# Patient Record
Sex: Female | Born: 1965
Health system: Southern US, Community
[De-identification: ages and names within clinical notes are randomized; demographics above are authoritative.]

## PROBLEM LIST (undated history)

## (undated) DIAGNOSIS — M1990 Unspecified osteoarthritis, unspecified site: Secondary | ICD-10-CM

## (undated) DIAGNOSIS — J40 Bronchitis, not specified as acute or chronic: Secondary | ICD-10-CM

## (undated) DIAGNOSIS — E559 Vitamin D deficiency, unspecified: Secondary | ICD-10-CM

## (undated) DIAGNOSIS — E039 Hypothyroidism, unspecified: Secondary | ICD-10-CM

## (undated) DIAGNOSIS — F329 Major depressive disorder, single episode, unspecified: Secondary | ICD-10-CM

## (undated) DIAGNOSIS — F32A Depression, unspecified: Secondary | ICD-10-CM

## (undated) DIAGNOSIS — G4733 Obstructive sleep apnea (adult) (pediatric): Secondary | ICD-10-CM

## (undated) DIAGNOSIS — F419 Anxiety disorder, unspecified: Secondary | ICD-10-CM

## (undated) DIAGNOSIS — M549 Dorsalgia, unspecified: Secondary | ICD-10-CM

## (undated) DIAGNOSIS — E739 Lactose intolerance, unspecified: Secondary | ICD-10-CM

## (undated) DIAGNOSIS — K449 Diaphragmatic hernia without obstruction or gangrene: Secondary | ICD-10-CM

## (undated) DIAGNOSIS — I10 Essential (primary) hypertension: Secondary | ICD-10-CM

## (undated) DIAGNOSIS — R7303 Prediabetes: Secondary | ICD-10-CM

## (undated) DIAGNOSIS — H9319 Tinnitus, unspecified ear: Secondary | ICD-10-CM

## (undated) DIAGNOSIS — M2550 Pain in unspecified joint: Secondary | ICD-10-CM

## (undated) DIAGNOSIS — N289 Disorder of kidney and ureter, unspecified: Secondary | ICD-10-CM

## (undated) DIAGNOSIS — K219 Gastro-esophageal reflux disease without esophagitis: Secondary | ICD-10-CM

## (undated) DIAGNOSIS — R42 Dizziness and giddiness: Secondary | ICD-10-CM

## (undated) DIAGNOSIS — K760 Fatty (change of) liver, not elsewhere classified: Secondary | ICD-10-CM

## (undated) DIAGNOSIS — J309 Allergic rhinitis, unspecified: Secondary | ICD-10-CM

## (undated) DIAGNOSIS — J302 Other seasonal allergic rhinitis: Secondary | ICD-10-CM

## (undated) DIAGNOSIS — K5900 Constipation, unspecified: Secondary | ICD-10-CM

## (undated) DIAGNOSIS — Z8719 Personal history of other diseases of the digestive system: Secondary | ICD-10-CM

## (undated) DIAGNOSIS — D649 Anemia, unspecified: Secondary | ICD-10-CM

## (undated) DIAGNOSIS — Z87442 Personal history of urinary calculi: Secondary | ICD-10-CM

## (undated) DIAGNOSIS — K589 Irritable bowel syndrome without diarrhea: Secondary | ICD-10-CM

## (undated) HISTORY — PX: CARPAL TUNNEL RELEASE: SHX101

## (undated) HISTORY — DX: Vitamin D deficiency, unspecified: E55.9

## (undated) HISTORY — DX: Dorsalgia, unspecified: M54.9

## (undated) HISTORY — DX: Fatty (change of) liver, not elsewhere classified: K76.0

## (undated) HISTORY — DX: Constipation, unspecified: K59.00

## (undated) HISTORY — PX: URETER SURGERY: SHX823

## (undated) HISTORY — PX: KNEE ARTHROSCOPY: SHX127

## (undated) HISTORY — DX: Disorder of kidney and ureter, unspecified: N28.9

## (undated) HISTORY — DX: Diaphragmatic hernia without obstruction or gangrene: K44.9

## (undated) HISTORY — DX: Lactose intolerance, unspecified: E73.9

## (undated) HISTORY — PX: COLONOSCOPY: SHX174

## (undated) HISTORY — DX: Morbid (severe) obesity due to excess calories: E66.01

## (undated) HISTORY — DX: Pain in unspecified joint: M25.50

## (undated) HISTORY — PX: TONSILLECTOMY: SUR1361

## (undated) HISTORY — DX: Unspecified osteoarthritis, unspecified site: M19.90

## (undated) HISTORY — DX: Allergic rhinitis, unspecified: J30.9

## (undated) HISTORY — PX: WISDOM TOOTH EXTRACTION: SHX21

## (undated) HISTORY — DX: Obstructive sleep apnea (adult) (pediatric): G47.33

## (undated) HISTORY — DX: Irritable bowel syndrome, unspecified: K58.9

## (undated) HISTORY — PX: OTHER SURGICAL HISTORY: SHX169

## (undated) HISTORY — DX: Essential (primary) hypertension: I10

---

## 2000-11-15 ENCOUNTER — Emergency Department (HOSPITAL_COMMUNITY): Admission: EM | Admit: 2000-11-15 | Discharge: 2000-11-15 | Payer: Self-pay | Admitting: Emergency Medicine

## 2000-11-15 ENCOUNTER — Encounter: Payer: Self-pay | Admitting: Emergency Medicine

## 2002-02-22 ENCOUNTER — Encounter: Admission: RE | Admit: 2002-02-22 | Discharge: 2002-02-22 | Payer: Self-pay | Admitting: Family Medicine

## 2002-02-22 ENCOUNTER — Encounter: Payer: Self-pay | Admitting: Family Medicine

## 2006-09-12 ENCOUNTER — Other Ambulatory Visit: Admission: RE | Admit: 2006-09-12 | Discharge: 2006-09-12 | Payer: Self-pay | Admitting: Endocrinology

## 2007-09-11 DIAGNOSIS — K449 Diaphragmatic hernia without obstruction or gangrene: Secondary | ICD-10-CM | POA: Insufficient documentation

## 2008-07-10 ENCOUNTER — Encounter: Admission: RE | Admit: 2008-07-10 | Discharge: 2008-07-10 | Payer: Self-pay | Admitting: Family Medicine

## 2010-01-04 ENCOUNTER — Encounter: Admission: RE | Admit: 2010-01-04 | Discharge: 2010-01-04 | Payer: Self-pay | Admitting: Family Medicine

## 2010-11-14 ENCOUNTER — Encounter: Payer: Self-pay | Admitting: Family Medicine

## 2011-02-08 DIAGNOSIS — F339 Major depressive disorder, recurrent, unspecified: Secondary | ICD-10-CM | POA: Insufficient documentation

## 2011-11-01 ENCOUNTER — Other Ambulatory Visit: Payer: Self-pay | Admitting: Orthopedic Surgery

## 2011-11-07 ENCOUNTER — Encounter (HOSPITAL_BASED_OUTPATIENT_CLINIC_OR_DEPARTMENT_OTHER): Payer: Self-pay | Admitting: *Deleted

## 2011-11-07 NOTE — Progress Notes (Signed)
No labs needed

## 2011-11-08 ENCOUNTER — Encounter (HOSPITAL_BASED_OUTPATIENT_CLINIC_OR_DEPARTMENT_OTHER)
Admission: RE | Disposition: A | Discharge status: Home / Self Care | Payer: Self-pay | Source: Ambulatory Visit | Attending: Orthopedic Surgery

## 2011-11-08 ENCOUNTER — Encounter (HOSPITAL_BASED_OUTPATIENT_CLINIC_OR_DEPARTMENT_OTHER): Payer: Self-pay | Admitting: Anesthesiology

## 2011-11-08 ENCOUNTER — Encounter (HOSPITAL_BASED_OUTPATIENT_CLINIC_OR_DEPARTMENT_OTHER): Payer: Self-pay | Admitting: *Deleted

## 2011-11-08 ENCOUNTER — Ambulatory Visit (HOSPITAL_BASED_OUTPATIENT_CLINIC_OR_DEPARTMENT_OTHER): Payer: 59 | Admitting: Anesthesiology

## 2011-11-08 ENCOUNTER — Ambulatory Visit (HOSPITAL_BASED_OUTPATIENT_CLINIC_OR_DEPARTMENT_OTHER)
Admission: RE | Admit: 2011-11-08 | Discharge: 2011-11-08 | Disposition: A | Discharge status: Home / Self Care | Payer: 59 | Source: Ambulatory Visit | Attending: Orthopedic Surgery | Admitting: Orthopedic Surgery

## 2011-11-08 DIAGNOSIS — M19049 Primary osteoarthritis, unspecified hand: Secondary | ICD-10-CM | POA: Insufficient documentation

## 2011-11-08 DIAGNOSIS — K219 Gastro-esophageal reflux disease without esophagitis: Secondary | ICD-10-CM | POA: Insufficient documentation

## 2011-11-08 HISTORY — DX: Hypothyroidism, unspecified: E03.9

## 2011-11-08 HISTORY — DX: Depression, unspecified: F32.A

## 2011-11-08 HISTORY — DX: Other seasonal allergic rhinitis: J30.2

## 2011-11-08 HISTORY — DX: Anxiety disorder, unspecified: F41.9

## 2011-11-08 HISTORY — DX: Gastro-esophageal reflux disease without esophagitis: K21.9

## 2011-11-08 HISTORY — PX: FINGER ARTHRODESIS: SHX5000

## 2011-11-08 HISTORY — DX: Major depressive disorder, single episode, unspecified: F32.9

## 2011-11-08 LAB — POCT HEMOGLOBIN-HEMACUE: Hemoglobin: 13.3 g/dL (ref 12.0–15.0)

## 2011-11-08 SURGERY — FUSION, JOINT, FINGER
Anesthesia: General | Site: Thumb | Laterality: Left | Wound class: Clean

## 2011-11-08 MED ORDER — FENTANYL CITRATE 0.05 MG/ML IJ SOLN: 25.0000 ug | INTRAMUSCULAR | Status: DC | PRN

## 2011-11-08 MED ORDER — CEPHALEXIN 500 MG PO CAPS: 500.0000 mg | ORAL_CAPSULE | Freq: Three times a day (TID) | ORAL | Status: AC

## 2011-11-08 MED ORDER — LIDOCAINE HCL 1 % IJ SOLN
INTRAMUSCULAR | Status: DC | PRN
  Administered 2011-11-08: 2 mL via INTRADERMAL

## 2011-11-08 MED ORDER — MIDAZOLAM HCL 2 MG/2ML IJ SOLN
0.5000 mg | INTRAMUSCULAR | Status: DC | PRN
  Administered 2011-11-08: 2 mg via INTRAVENOUS

## 2011-11-08 MED ORDER — IBUPROFEN 600 MG PO TABS: 600.0000 mg | ORAL_TABLET | Freq: Four times a day (QID) | ORAL | Status: AC | PRN

## 2011-11-08 MED ORDER — ONDANSETRON HCL 4 MG/2ML IJ SOLN
INTRAMUSCULAR | Status: DC | PRN
  Administered 2011-11-08: 4 mg via INTRAVENOUS

## 2011-11-08 MED ORDER — CHLORHEXIDINE GLUCONATE 4 % EX LIQD: 60.0000 mL | Freq: Once | CUTANEOUS | Status: DC

## 2011-11-08 MED ORDER — ROPIVACAINE HCL 5 MG/ML IJ SOLN
INTRAMUSCULAR | Status: DC | PRN
  Administered 2011-11-08: 25 mL via EPIDURAL

## 2011-11-08 MED ORDER — LIDOCAINE HCL (CARDIAC) 20 MG/ML IV SOLN
INTRAVENOUS | Status: DC | PRN
  Administered 2011-11-08: 50 mg via INTRAVENOUS

## 2011-11-08 MED ORDER — LACTATED RINGERS IV SOLN
INTRAVENOUS | Status: DC
  Administered 2011-11-08 (×2): via INTRAVENOUS

## 2011-11-08 MED ORDER — DEXAMETHASONE SODIUM PHOSPHATE 10 MG/ML IJ SOLN
INTRAMUSCULAR | Status: DC | PRN
  Administered 2011-11-08: 10 mg via INTRAVENOUS

## 2011-11-08 MED ORDER — METOCLOPRAMIDE HCL 5 MG/ML IJ SOLN: 10.0000 mg | Freq: Once | INTRAMUSCULAR | Status: DC | PRN

## 2011-11-08 MED ORDER — FENTANYL CITRATE 0.05 MG/ML IJ SOLN
50.0000 ug | INTRAMUSCULAR | Status: DC | PRN
  Administered 2011-11-08: 100 ug via INTRAVENOUS

## 2011-11-08 MED ORDER — CEFAZOLIN SODIUM 1-5 GM-% IV SOLN
1.0000 g | Freq: Once | INTRAVENOUS | Status: AC
  Administered 2011-11-08: 2 g via INTRAVENOUS

## 2011-11-08 MED ORDER — PROPOFOL 10 MG/ML IV EMUL
INTRAVENOUS | Status: DC | PRN
  Administered 2011-11-08: 300 mg via INTRAVENOUS

## 2011-11-08 MED ORDER — OXYCODONE-ACETAMINOPHEN 5-325 MG PO TABS: ORAL_TABLET | ORAL | Status: DC

## 2011-11-08 MED ORDER — MORPHINE SULFATE 2 MG/ML IJ SOLN: 0.0500 mg/kg | INTRAMUSCULAR | Status: DC | PRN

## 2011-11-08 SURGICAL SUPPLY — 85 items
BANDAGE ADHESIVE 1X3 (GAUZE/BANDAGES/DRESSINGS) IMPLANT
BANDAGE CONFORM 3  STR LF (GAUZE/BANDAGES/DRESSINGS) IMPLANT
BANDAGE ELASTIC 3 VELCRO ST LF (GAUZE/BANDAGES/DRESSINGS) ×1 IMPLANT
BANDAGE GAUZE ELAST BULKY 4 IN (GAUZE/BANDAGES/DRESSINGS) ×1 IMPLANT
BIT DRILL 1.1 MINI (BIT) IMPLANT
BLADE MINI RND TIP GREEN BEAV (BLADE) ×1 IMPLANT
BLADE SURG 15 STRL LF DISP TIS (BLADE) ×1 IMPLANT
BLADE SURG 15 STRL SS (BLADE) ×2
BNDG CMPR 9X4 STRL LF SNTH (GAUZE/BANDAGES/DRESSINGS) ×1
BNDG CMPR MD 5X2 ELC HKLP STRL (GAUZE/BANDAGES/DRESSINGS)
BNDG COHESIVE 1X5 TAN STRL LF (GAUZE/BANDAGES/DRESSINGS) IMPLANT
BNDG ELASTIC 2 VLCR STRL LF (GAUZE/BANDAGES/DRESSINGS) IMPLANT
BNDG ESMARK 4X9 LF (GAUZE/BANDAGES/DRESSINGS) ×2 IMPLANT
BRUSH SCRUB EZ PLAIN DRY (MISCELLANEOUS) ×2 IMPLANT
BUR EGG 3PK/BX (BURR) ×1 IMPLANT
BUR EGG/OVAL CARBIDE (BURR) IMPLANT
BUR FAST CUTTING MED (BURR) IMPLANT
CLOTH BEACON ORANGE TIMEOUT ST (SAFETY) ×2 IMPLANT
CORDS BIPOLAR (ELECTRODE) ×2 IMPLANT
COVER MAYO STAND STRL (DRAPES) ×2 IMPLANT
COVER TABLE BACK 60X90 (DRAPES) ×2 IMPLANT
CUFF TOURNIQUET SINGLE 18IN (TOURNIQUET CUFF) IMPLANT
CUFF TOURNIQUET SINGLE 24IN (TOURNIQUET CUFF) ×1 IMPLANT
DECANTER SPIKE VIAL GLASS SM (MISCELLANEOUS) IMPLANT
DRAPE EXTREMITY T 121X128X90 (DRAPE) ×2 IMPLANT
DRAPE OEC MINIVIEW 54X84 (DRAPES) ×2 IMPLANT
DRAPE SURG 17X23 STRL (DRAPES) ×2 IMPLANT
DRILL BIT 1.1 MINI (BIT) ×2
DRSG TEGADERM 4X4.75 (GAUZE/BANDAGES/DRESSINGS) IMPLANT
GAUZE XEROFORM 1X8 LF (GAUZE/BANDAGES/DRESSINGS) IMPLANT
GLOVE BIO SURGEON STRL SZ 6.5 (GLOVE) ×2 IMPLANT
GLOVE BIOGEL M STRL SZ7.5 (GLOVE) ×2 IMPLANT
GLOVE BIOGEL PI IND STRL 7.0 (GLOVE) IMPLANT
GLOVE BIOGEL PI IND STRL 8 (GLOVE) ×1 IMPLANT
GLOVE BIOGEL PI INDICATOR 7.0 (GLOVE) ×1
GLOVE BIOGEL PI INDICATOR 8 (GLOVE)
GLOVE EXAM NITRILE MD LF STRL (GLOVE) ×1 IMPLANT
GLOVE ORTHO TXT STRL SZ7.5 (GLOVE) ×2 IMPLANT
GOWN PREVENTION PLUS XLARGE (GOWN DISPOSABLE) ×3 IMPLANT
GOWN PREVENTION PLUS XXLARGE (GOWN DISPOSABLE) ×2 IMPLANT
GOWN STRL REIN XL XLG (GOWN DISPOSABLE) ×2 IMPLANT
K-WIRE 4.0X.028 (WIRE) IMPLANT
KWIRE 4.0 X .035IN (WIRE) IMPLANT
LOOP VESSEL MAXI BLUE (MISCELLANEOUS) IMPLANT
NDL HYPO 25X1 1.5 SAFETY (NEEDLE) IMPLANT
NEEDLE 27GAX1X1/2 (NEEDLE) IMPLANT
NEEDLE HYPO 25X1 1.5 SAFETY (NEEDLE) IMPLANT
NS IRRIG 1000ML POUR BTL (IV SOLUTION) ×2 IMPLANT
PACK BASIN DAY SURGERY FS (CUSTOM PROCEDURE TRAY) ×2 IMPLANT
PAD CAST 3X4 CTTN HI CHSV (CAST SUPPLIES) IMPLANT
PADDING CAST ABS 4INX4YD NS (CAST SUPPLIES)
PADDING CAST ABS COTTON 4X4 ST (CAST SUPPLIES) ×1 IMPLANT
PADDING CAST COTTON 3X4 STRL (CAST SUPPLIES) ×2
PADDING UNDERCAST 2  STERILE (CAST SUPPLIES) ×2 IMPLANT
PLATE H EXTENDED RIGHT 1.5MM (Plate) ×1 IMPLANT
SCREW CORTEX 1.5X10 (Screw) ×3 IMPLANT
SCREW CORTEX 1.5X14 (Screw) ×3 IMPLANT
SCREW CORTEX 1.5X16 (Screw) ×1 IMPLANT
SCREW CORTEX 1.5X18 (Screw) ×1 IMPLANT
SLEEVE SCD COMPRESS KNEE MED (MISCELLANEOUS) ×1 IMPLANT
SLING ARM FOAM STRAP LRG (SOFTGOODS) ×1 IMPLANT
SPLINT PLASTER EXTRA FAST 3X15 (CAST SUPPLIES) ×10
SPLINT PLASTER GYPS XFAST 3X15 (CAST SUPPLIES) IMPLANT
SPONGE GAUZE 4X4 12PLY (GAUZE/BANDAGES/DRESSINGS) ×2 IMPLANT
STOCKINETTE 4X48 STRL (DRAPES) ×2 IMPLANT
STRIP CLOSURE SKIN 1/2X4 (GAUZE/BANDAGES/DRESSINGS) ×1 IMPLANT
SUT ETHIBOND 3-0 V-5 (SUTURE) ×1 IMPLANT
SUT ETHILON 5 0 P 3 18 (SUTURE)
SUT FIBERWIRE 3-0 18 TAPR NDL (SUTURE)
SUT MERSILENE 4 0 P 3 (SUTURE) IMPLANT
SUT NYLON ETHILON 5-0 P-3 1X18 (SUTURE) IMPLANT
SUT PROLENE 3 0 PS 2 (SUTURE) IMPLANT
SUT PROLENE 4 0 P 3 18 (SUTURE) IMPLANT
SUT STEEL 0 (SUTURE)
SUT STEEL 0 18XMFL TIE 17 (SUTURE) IMPLANT
SUT VIC AB 4-0 P-3 18XBRD (SUTURE) IMPLANT
SUT VIC AB 4-0 P3 18 (SUTURE) ×2
SUTURE FIBERWR 3-0 18 TAPR NDL (SUTURE) IMPLANT
SYR 3ML 23GX1 SAFETY (SYRINGE) IMPLANT
SYR BULB 3OZ (MISCELLANEOUS) ×2 IMPLANT
SYR CONTROL 10ML LL (SYRINGE) ×1 IMPLANT
TOWEL OR 17X24 6PK STRL BLUE (TOWEL DISPOSABLE) ×4 IMPLANT
TRAY DSU PREP LF (CUSTOM PROCEDURE TRAY) ×2 IMPLANT
UNDERPAD 30X30 INCONTINENT (UNDERPADS AND DIAPERS) ×2 IMPLANT
WATER STERILE IRR 1000ML POUR (IV SOLUTION) ×1 IMPLANT

## 2011-11-08 NOTE — Anesthesia Postprocedure Evaluation (Signed)
Anesthesia Post Note  Patient: Melissa [name]  Procedure(s) Performed:  ARTHRODESIS FINGER - left thumb carpal-metacarpal fusion  Anesthesia type: General  Patient location: PACU  Post pain: Pain level controlled  Post assessment: Patient's Cardiovascular Status Stable  Last Vitals:  Filed Vitals:   11/08/11 1255  BP: 135/68  Pulse: 83  Temp: 37.1 C  Resp: 16    Post vital signs: Reviewed and stable  Level of consciousness: alert  Complications: No apparent anesthesia complications

## 2011-11-08 NOTE — Op Note (Signed)
Op note dictated 11/08/11  [id number]

## 2011-11-08 NOTE — Progress Notes (Signed)
Assisted Dr. Frederick with left, ultrasound guided, supraclavicular block. Side rails up, monitors on throughout procedure. See vital signs in flow sheet. Tolerated Procedure well. 

## 2011-11-08 NOTE — Anesthesia Preprocedure Evaluation (Signed)
Anesthesia Evaluation  Patient identified by MRN, date of birth, ID band Patient awake    Reviewed: Allergy & Precautions, H&P , NPO status , Patient's Chart, lab work & pertinent test results, reviewed documented beta blocker date and time   Airway Mallampati: II TM Distance: >3 FB Neck ROM: full    Dental   Pulmonary neg pulmonary ROS,          Cardiovascular neg cardio ROS     Neuro/Psych PSYCHIATRIC DISORDERS Negative Neurological ROS     GI/Hepatic negative GI ROS, Neg liver ROS, GERD-  Medicated and Controlled,  Endo/Other  Morbid obesity  Renal/GU negative Renal ROS  Genitourinary negative   Musculoskeletal   Abdominal   Peds  Hematology negative hematology ROS (+)   Anesthesia Other Findings See surgeon's H&P   Reproductive/Obstetrics negative OB ROS                           Anesthesia Physical Anesthesia Plan  ASA: III  Anesthesia Plan: General   Post-op Pain Management: MAC Combined w/ Regional for Post-op pain   Induction: Intravenous  Airway Management Planned: LMA  Additional Equipment:   Intra-op Plan:   Post-operative Plan: Extubation in OR  Informed Consent: I have reviewed the patients History and Physical, chart, labs and discussed the procedure including the risks, benefits and alternatives for the proposed anesthesia with the patient or authorized representative who has indicated his/her understanding and acceptance.     Plan Discussed with: CRNA and Surgeon  Anesthesia Plan Comments:         Anesthesia Quick Evaluation

## 2011-11-08 NOTE — Brief Op Note (Signed)
11/08/2011  11:39 AM  PATIENT:  Melissa [name]  46 y.o. female  PRE-OPERATIVE DIAGNOSIS:  arthritis left thumb carpometacarpal joint  POST-OPERATIVE DIAGNOSIS:  arthritis left thumb carpometacarpal joint  PROCEDURE:  Procedure(s): ARTHRODESIS OF LEFT THUMB CARPOMETACARPAL JOINT  SURGEON:  Surgeon(s): Wyn Forster., MD  PHYSICIAN ASSISTANT:   ASSISTANTS: Mallory Shirk.A-C   ANESTHESIA:   general  EBL:  Total I/O In: 1000 [I.V.:1000] Out: -   BLOOD ADMINISTERED:none  DRAINS: none   LOCAL MEDICATIONS USED:  Plexus block pre operatively  SPECIMEN:  No Specimen  DISPOSITION OF SPECIMEN:  N/A  COUNTS:  YES  TOURNIQUET:  * Missing tourniquet times found for documented tourniquets in log:  [id number] *  DICTATION: .Other Dictation: Dictation Number [id number]  PLAN OF CARE: Discharge to home after PACU  PATIENT DISPOSITION:  PACU - hemodynamically stable.   Delay start of Pharmacological VTE agent (>24hrs) due to surgical blood loss or risk of bleeding:  NO/NOT APPLICABLE

## 2011-11-08 NOTE — Anesthesia Procedure Notes (Addendum)
Anesthesia Regional Block:  Supraclavicular block  Pre-Anesthetic Checklist: ,, timeout performed, Correct Patient, Correct Site, Correct Laterality, Correct Procedure, Correct Position, site marked, Risks and benefits discussed,  Surgical consent,  Pre-op evaluation,  At surgeon's request and post-op pain management  Laterality: Left  Prep: chloraprep       Needles:   Needle Type: Other   (Arrow Echogenic)   Needle Length: 9cm  Needle Gauge: 21    Additional Needles:  Procedures: ultrasound guided Supraclavicular block Narrative:  Start time: 11/08/2011 9:58 AM End time: 11/08/2011 10:05 AM Injection made incrementally with aspirations every 5 mL.  Performed by: Personally  Anesthesiologist: C Frederick  Additional Notes: Ultrasound guidance used to: id relevant anatomy, confirm needle position, local anesthetic spread, avoidance of vascular puncture. Picture saved. No complications. Block performed personally by Janetta Hora. Gelene Mink, MD    Supraclavicular block Procedure Name: LMA Insertion Performed by: Sharyne Richters Pre-anesthesia Checklist: Patient identified, Timeout performed, Emergency Drugs available, Suction available and Patient being monitored Patient Re-evaluated:Patient Re-evaluated prior to inductionOxygen Delivery Method: Circle System Utilized Preoxygenation: Pre-oxygenation with 100% oxygen Intubation Type: IV induction Ventilation: Mask ventilation without difficulty LMA: LMA with gastric port inserted LMA Size: 4.0 Number of attempts: 1 Placement Confirmation: breath sounds checked- equal and bilateral and positive ETCO2 Tube secured with: Tape Dental Injury: Teeth and Oropharynx as per pre-operative assessment

## 2011-11-08 NOTE — Transfer of Care (Signed)
Immediate Anesthesia Transfer of Care Note  Patient: Melissa [name]  Procedure(s) Performed:  ARTHRODESIS FINGER - left thumb carpal-metacarpal fusion  Patient Location: PACU  Anesthesia Type: General  Level of Consciousness: awake  Airway & Oxygen Therapy: Patient Spontanous Breathing and Patient connected to face mask oxygen  Post-op Assessment: Report given to PACU RN and Post -op Vital signs reviewed and stable  Post vital signs: Reviewed and stable Filed Vitals:   11/08/11 1008  BP:   Pulse: 87  Temp:   Resp: 12    Complications: No apparent anesthesia complications

## 2011-11-08 NOTE — H&P (Signed)
Melissa [name] is an 46 y.o. female.   Chief Complaint: Planing of chronic pain left thumb HPI: The patient is a 46 year old right-hand-dominant female who works hard with her hands every day. She has been a long-time patient of our practice we have seen her in regards to her left thumb CMC arthrosis. She is undergone numerous injections into her [facility] joint her x-rays reveal Eaton stage 3+ CMC arthrosis. She wishes to proceed with surgical intervention for this progressive and chronic predicament.  Past Medical History  Diagnosis Date  . Hypothyroidism   . GERD (gastroesophageal reflux disease)   . Anxiety   . Depression   . Seasonal allergies     Past Surgical History  Procedure Date  . Carpal tunnel release     rt/lt  . Knee arthroscopy     both knees  . Ureter surgery     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-little lt kidney function    History reviewed. No pertinent family history. Social History:  reports that she has never smoked. She does not have any smokeless tobacco history on file. She reports that she does not drink alcohol or use illicit drugs.  Allergies: No Known Allergies  Medications Prior to Admission  Medication Dose Route Frequency Provider Last Rate Last Dose  . chlorhexidine (HIBICLENS) 4 % liquid 4 application  60 mL Topical Once       . lactated ringers infusion   Intravenous Continuous Zenon Mayo, MD       Medications Prior to Admission  Medication Sig Dispense Refill  . acetaminophen (TYLENOL) 325 MG tablet Take 650 mg by mouth every 6 (six) hours as needed.      Marland Kitchen buPROPion (WELLBUTRIN XL) 300 MG 24 hr tablet Take 300 mg by mouth daily.      . calcium-vitamin D (OSCAL WITH D) 500-200 MG-UNIT per tablet Take 1 tablet by mouth daily.      . fish oil-omega-3 fatty acids 1000 MG capsule Take 2 g by mouth daily.      Marland Kitchen FLUoxetine (PROZAC) 40 MG capsule Take 40 mg by mouth daily.      Marland Kitchen levothyroxine (SYNTHROID, LEVOTHROID) 175 MCG tablet  Take 175 mcg by mouth daily.      Marland Kitchen loratadine (CLARITIN) 10 MG tablet Take 10 mg by mouth daily.      Marland Kitchen omeprazole (PRILOSEC) 40 MG capsule Take 40 mg by mouth Nightly.      . pravastatin (PRAVACHOL) 40 MG tablet Take 40 mg by mouth every evening.        No results found for this or any previous visit (from the past 48 hour(s)).  No results found.   Pertinent items are noted in HPI.  Blood pressure 124/71, pulse 75, temperature 98 F (36.7 C), temperature source Oral, resp. rate 20, height 5\' 4"  (1.626 m), weight 104.327 kg (230 lb), SpO2 98.00%.  General appearance: alert Head: Normocephalic, without obvious abnormality Neck: supple, symmetrical, trachea midline Resp: clear to auscultation bilaterally Cardio: regular rate and rhythm, S1, S2 normal, no murmur, click, rub or gallop GI: normal findings: bowel sounds normal Extremities examination of the left thumb reveals pain to palpation in the area of the [facility] joint. She has a positive grind and translation test. Neurovascularly the, and remainder of the hand are intact. X-rays reveal Eaton stage 3+ CMC arthrosis. Pulses: 2+ and symmetric Skin: normal Neurologic: Grossly normal    Assessment/Plan Impression: Chronic and progressive left thumb CMC arthrosis  Plan: Pt to undergo  Left thumb CMC arthrodesis.The procedure, risks and post-op course were discussed with the patient at length and she was in agreement with the plan. DASNOIT,Arminda Foglio J 11/08/2011, 9:26 AM   H&P documentation: 11/08/2011  -History and Physical Reviewed  -Patient has been re-examined  -No change in the plan of care  Wyn Forster, MD

## 2011-11-09 ENCOUNTER — Encounter (HOSPITAL_BASED_OUTPATIENT_CLINIC_OR_DEPARTMENT_OTHER): Payer: Self-pay | Admitting: Orthopedic Surgery

## 2011-11-09 NOTE — Op Note (Signed)
[name], [name]              ACCOUNT NO.:  [id number]  MEDICAL RECORD NO.:  [id number]  LOCATION:                                 FACILITY:  PHYSICIAN:  Katy Fitch. Merle Whitehorn, M.D.      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  OPERATION:  Arthrodesis of left thumb carpometacarpal joint with application of an 8-hole ladder plate with 1.5 mm screw fixation including lag screw fixation across the carpometacarpal joint.  OPERATIVE SURGEON:  Katy Fitch. Osias Resnick, MD  ASSISTANT:  Marveen Reeks. Dasnoit, PA-C.  ANESTHESIA:  General by LMA supplemented by a left plexus block.  SUPERVISING ANESTHESIOLOGIST:  Janetta Hora. 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  She is involved daily in very heavy physical labor.   In July, 2010, she presented for evaluation of left thumb carpometacarpal arthritis.  At that time, we treated her with splinting activity modification, glucosamine and activity suggestions for her work.  She subsequently was treated with a series of steroid injections without longstanding relief.  She requested that some type of surgery be performs so she could continue her gainful employment.  At age 46 in her nondominant hand, I recommended that we proceed with arthrodesis anticipating at a later date.  She could have this taken down for an arthroplasty if she desired.  Preoperatively, films of her scaphoid trapezial trapezoid joint and trapezial trapezoid joint were  carefully inspected and found to be clinically normal.  Preoperatively, she was reminded of potential risks and benefits of surgery.  With all attempts at arthrodesis, there is a possibility that we will not be successful achieving arthrodesis and might require revision surgery at a later date for additional bone graft and/or repeat internal fixation.  Other risks including infection, anesthetic risks, and neurovascular injury were discussed.  Questions were invited and answered in detail.  PROCEDURE:  Melissa [name] was brought to room 2 of the [facility] and placed supine position on the operating room table.  Following an anesthesia consult by Dr. Gelene Mink, general anesthesia by LMA technique was recommended and accepted as well as a left plexus block for perioperative comfort.  The block was placed in the holding area without complication utilizing ultrasound guidance.  After detailed informed consent, she was transferred to room 2 where under Dr. Zada Girt direct supervision, general anesthesia by LMA technique was induced.  Ancef 2 g were administered as an IV prophylactic antibiotic per weight protocol followed by routine Betadine scrub and paint in the left upper extremity.  Following routine surgical time-out, the left upper extremity was exsanguinated with an Esmarch bandage and the arterial tourniquet on the proximal brachium inflated to 220 mmHg.  Procedure commenced with a longitudinal incision paralleling the extensor pollicis brevis.  This was extended to the diaphysis of the  metacarpal and proximally to the scaphoid.  Subcutaneous tissues were carefully divided taking care to identify the radial superficial sensory branches, the extensor pollicis brevis and extensor pollicis longus.  The extensor retinaculum was split between the 2 tendons followed by subperiosteal elevation exposing the proximal metaphysis of the thumb metacarpal and then distal  90% of the trapezium.  The radial artery and its accompanying vessels particularly the vessels of the carpus were identified and carefully protected.  After blunt retractors were placed, we proceeded to perform a synovectomy at the [facility] joint.  There was complete loss of hyaline articular cartilage surfaces of the trapezium and about 85% loss on the base of the thumb metacarpal.  A rongeur was used to remove residual cartilage of the base of metacarpal followed by use of a micro curette and a power bur to create matching cup and cone type surfaces for a thumb arthrodesis.  We ultimately positioned thumb at 40 degrees of palmar abduction and approximately 10 degrees of radial abduction in slight pronation.  With a local bone graft in place, we compressed the joint surfaces and applied an ASIF stainless steel ladder plate with properly measured screws.  The screws in the 2nd most proximal position were placed across the [facility] joint as lag screws.  The remaining screws were placed with standard ASIF technique.  A very satisfactory construct was achieved.  We confirmed that the thumb tip could touch the tip of all fingers and could perform proper key pinch.  Thereafter the periosteum was meticulously repaired with mattress sutures of 3-0 Ethibond followed by careful identification position of the radial artery.  The radial artery was not contacting the plate.  The wound was thoroughly irrigated followed by repair of the skin with subcutaneous suture of 4-0 Vicryl and intradermal 3-0 Prolene.  Melissa [name] was placed in a voluminous gauze hand dressing with Webril and plaster splint supporting the thumb in the fused position.  There were no apparent complications.  For aftercare, she was provided prescriptions for oxycodone 5 mg 1 or 2 tablets p.o. q.4-6 [name]. p.r.n. pain, 30 tablets without refill, also Motrin 600 mg 1 p.o. q.6 [name]. p.r.n. pain and Keflex 5 mg 1 p.o. q.8 [name]. x4 days as  prophylactic antibiotic.     Katy Fitch Zavior Thomason, M.D.     RVS/MEDQ  D:  11/08/2011  T:  11/09/2011  Job:  [id number]  cc:   Melissa Nettle, NP

## 2012-01-16 ENCOUNTER — Encounter (HOSPITAL_BASED_OUTPATIENT_CLINIC_OR_DEPARTMENT_OTHER): Payer: Self-pay

## 2012-10-23 ENCOUNTER — Encounter (HOSPITAL_COMMUNITY): Payer: Self-pay | Admitting: Pharmacy Technician

## 2012-10-25 NOTE — Progress Notes (Signed)
EKG, Clearance and OV note Fannie Knee NP 12/13 on chart

## 2012-10-25 NOTE — Patient Instructions (Addendum)
20 Melissa [name]  10/25/2012   Your procedure is scheduled on: 11/05/12  MONDAY   Report to [facility] at    0515   AM.  Call this number if you have problems the morning of surgery: [phone]       Remember:   Do not eat food  Or drink :After Midnight. Sunday NIGHT   Take these medicines the morning of surgery with A SIP OF WATER: Wellbutrin, Prozac, Levothyroxine, Claritin   .  Contacts, dentures or partial plates can not be worn to surgery  Leave suitcase in the car. After surgery it may be brought to your room.  For patients admitted to the hospital, checkout time is 11:00 AM day of  discharge.             SPECIAL INSTRUCTIONS- SEE [place] PREPARING FOR SURGERY INSTRUCTION SHEET-     DO NOT WEAR JEWELRY, LOTIONS, POWDERS, OR PERFUMES.  WOMEN-- DO NOT SHAVE LEGS OR UNDERARMS FOR 12 HOURS BEFORE SHOWERS. MEN MAY SHAVE FACE.  Patients discharged the day of surgery will not be allowed to drive home. IF going home the day of surgery, you must have a driver and someone to stay with you for the first 24 hours  Name and phone number of your driver:    admission                                                                    Please read over the following fact sheets that you were given: MRSA Information, Incentive Spirometry Sheet, Blood Transfusion Sheet  Information                                                                                   [name]  PST 336  [phone]                 FAILURE TO FOLLOW THESE INSTRUCTIONS MAY RESULT IN  CANCELLATION   OF YOUR SURGERY                                                  Patient Signature _____________________________

## 2012-10-26 ENCOUNTER — Encounter (HOSPITAL_COMMUNITY): Payer: Self-pay

## 2012-10-26 ENCOUNTER — Encounter (HOSPITAL_COMMUNITY)
Admission: RE | Admit: 2012-10-26 | Discharge: 2012-10-26 | Disposition: A | Discharge status: Home / Self Care | Payer: BC Managed Care – PPO | Source: Ambulatory Visit | Attending: Orthopedic Surgery | Admitting: Orthopedic Surgery

## 2012-10-26 HISTORY — DX: Unspecified osteoarthritis, unspecified site: M19.90

## 2012-10-26 LAB — URINALYSIS, ROUTINE W REFLEX MICROSCOPIC
Glucose, UA: NEGATIVE mg/dL
Ketones, ur: NEGATIVE mg/dL
Leukocytes, UA: NEGATIVE
pH: 7.5 (ref 5.0–8.0)

## 2012-10-26 LAB — SURGICAL PCR SCREEN
MRSA, PCR: NEGATIVE
Staphylococcus aureus: POSITIVE — AB

## 2012-10-26 LAB — CBC
HCT: 38.4 % (ref 36.0–46.0)
MCV: 84.2 fL (ref 78.0–100.0)
RDW: 13.6 % (ref 11.5–15.5)
WBC: 5.4 10*3/uL (ref 4.0–10.5)

## 2012-10-26 LAB — APTT: aPTT: 31 seconds (ref 24–37)

## 2012-10-26 LAB — HCG, SERUM, QUALITATIVE: Preg, Serum: NEGATIVE

## 2012-10-26 LAB — BASIC METABOLIC PANEL
CO2: 27 mEq/L (ref 19–32)
Chloride: 101 mEq/L (ref 96–112)
Creatinine, Ser: 0.88 mg/dL (ref 0.50–1.10)

## 2012-10-26 NOTE — H&P (Signed)
TOTAL KNEE ADMISSION H&P  Patient is being admitted for right medial unicompartmental knee arthroplasty.  Subjective:  Chief Complaint:   Right knee OA / pain.  HPI: Melissa [name], 47 y.o. female, has a history of pain and functional disability in the right knee due to arthritis and has failed non-surgical conservative treatments for greater than 12 weeks to includeNSAID's and/or analgesics, corticosteriod injections and activity modification.  Onset of symptoms was gradual, starting >10 years ago with gradually worsening course since that time. The patient noted prior procedures on the knee to include  arthroscopy on the right knee(s).  Patient currently rates pain in the right knee(s) at 10 out of 10 with activity. Patient has worsening of pain with activity and weight bearing, pain that interferes with activities of daily living, pain with passive range of motion, crepitus and joint swelling.  Patient has evidence of periarticular osteophytes and joint space narrowing by imaging studies.  There is no active infection. Risks, benefits and expectations were discussed with the patient. Patient understand the risks, benefits and expectations and wishes to proceed with surgery.   D/C Plans:  Home with HHPT  Post-op Meds:   Rx given for ASA, Robaxin, Celebrex, Iron, Colace and MiraLax  Tranexamic Acid:  To be given  Decadron:   To be given   Past Medical History  Diagnosis Date  . Hypothyroidism   . GERD (gastroesophageal reflux disease)   . Anxiety   . Depression   . Seasonal allergies   . Arthritis     Past Surgical History  Procedure Date  . Carpal tunnel release     rt/lt  . Knee arthroscopy     both knees  . Ureter surgery     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-little lt kidney function  . Finger arthrodesis 11/08/2011    Procedure: ARTHRODESIS FINGER;  Surgeon: Wyn Forster., MD;  Location: [facility];  Service: Orthopedics;  Laterality: Left;  left  thumb carpal-metacarpal fusion  . Tonsillectomy      No Known Allergies  History  Substance Use Topics  . Smoking status: Never Smoker   . Smokeless tobacco: Never Used  . Alcohol Use: No     Comment: not in 4 yr      Review of Systems  Constitutional: Negative.   HENT: Negative.   Eyes: Negative.   Respiratory: Negative.   Cardiovascular: Negative.   Gastrointestinal: Negative.   Genitourinary: Negative.   Musculoskeletal: Positive for joint pain.  Skin: Negative.   Neurological: Negative.   Endo/Heme/Allergies: Negative.   Psychiatric/Behavioral: Negative.     Objective:  Physical Exam  Constitutional: She is oriented to person, place, and time. She appears well-developed and well-nourished.  HENT:  Head: Normocephalic and atraumatic.  Mouth/Throat: Oropharynx is clear and moist.  Eyes: Pupils are equal, round, and reactive to light.  Neck: Neck supple. No JVD present. No tracheal deviation present. No thyromegaly present.  Cardiovascular: Normal rate, regular rhythm, normal heart sounds and intact distal pulses.   Respiratory: Effort normal and breath sounds normal. No respiratory distress. She has no wheezes.  GI: Soft. There is no tenderness. There is no guarding.  Musculoskeletal:       Right knee: She exhibits decreased range of motion, swelling and bony tenderness. She exhibits no ecchymosis, no laceration, no erythema and normal alignment. tenderness found. Medial joint line tenderness noted. No lateral joint line tenderness noted.  Lymphadenopathy:    She has no cervical adenopathy.  Neurological: She  is alert and oriented to person, place, and time.  Skin: Skin is warm and dry.  Psychiatric: She has a normal mood and affect.    Vital signs in last 24 hours: Temp:  [97.8 F (36.6 C)] 97.8 F (36.6 C) (01/03 0933) Pulse Rate:  [73] 73  (01/03 0933) Resp:  [18] 18  (01/03 0933) BP: (140)/(77) 140/77 mmHg (01/03 0933) SpO2:  [100 %] 100 % (01/03  0933) Weight:  [105.235 kg (232 lb)] 105.235 kg (232 lb) (01/03 0933)  Labs:   Estimated Body mass index is 39.48 kg/(m^2) as calculated from the following:   Height as of 11/08/11: 5\' 4" (1.626 m).   Weight as of 11/08/11: 230 lb(104.327 kg).   Imaging Review Plain radiographs demonstrate moderate degenerative joint disease of the right knee, medial compartment. The overall alignment isneutral. The bone quality appears to be good for age and reported activity level.  Assessment/Plan:  End stage arthritis, right knee, medial compartment.  The patient history, physical examination, clinical judgment of the provider and imaging studies are consistent with end stage degenerative joint disease of the right knee(s) and total knee arthroplasty is deemed medically necessary. The treatment options including medical management, injection therapy arthroscopy and arthroplasty were discussed at length. The risks and benefits of total knee arthroplasty were presented and reviewed. The risks due to aseptic loosening, infection, stiffness, patella tracking problems, thromboembolic complications and other imponderables were discussed. The patient acknowledged the explanation, agreed to proceed with the plan and consent was signed. Patient is being admitted for inpatient treatment for surgery, pain control, PT, OT, prophylactic antibiotics, VTE prophylaxis, progressive ambulation and ADL's and discharge planning. The patient is planning to be discharged home with home health services.    Anastasio Auerbach Rex Oesterle   PAC  10/26/2012, 4:36 PM

## 2012-11-05 ENCOUNTER — Encounter (HOSPITAL_COMMUNITY): Payer: Self-pay | Admitting: *Deleted

## 2012-11-05 ENCOUNTER — Encounter (HOSPITAL_COMMUNITY): Payer: Self-pay | Admitting: Registered Nurse

## 2012-11-05 ENCOUNTER — Encounter (HOSPITAL_COMMUNITY)
Admission: RE | Disposition: A | Discharge status: Home Health | Payer: Self-pay | Source: Ambulatory Visit | Attending: Orthopedic Surgery

## 2012-11-05 ENCOUNTER — Inpatient Hospital Stay (HOSPITAL_COMMUNITY): Payer: BC Managed Care – PPO | Admitting: Registered Nurse

## 2012-11-05 ENCOUNTER — Inpatient Hospital Stay (HOSPITAL_COMMUNITY)
Admission: RE | Admit: 2012-11-05 | Discharge: 2012-11-06 | DRG: 209 | Disposition: A | Discharge status: Home Health | Payer: BC Managed Care – PPO | Source: Ambulatory Visit | Attending: Orthopedic Surgery | Admitting: Orthopedic Surgery

## 2012-11-05 DIAGNOSIS — Z01812 Encounter for preprocedural laboratory examination: Secondary | ICD-10-CM

## 2012-11-05 DIAGNOSIS — F411 Generalized anxiety disorder: Secondary | ICD-10-CM | POA: Diagnosis present

## 2012-11-05 DIAGNOSIS — F329 Major depressive disorder, single episode, unspecified: Secondary | ICD-10-CM | POA: Diagnosis present

## 2012-11-05 DIAGNOSIS — D62 Acute posthemorrhagic anemia: Secondary | ICD-10-CM | POA: Diagnosis not present

## 2012-11-05 DIAGNOSIS — Z96651 Presence of right artificial knee joint: Secondary | ICD-10-CM

## 2012-11-05 DIAGNOSIS — F3289 Other specified depressive episodes: Secondary | ICD-10-CM | POA: Diagnosis present

## 2012-11-05 DIAGNOSIS — K219 Gastro-esophageal reflux disease without esophagitis: Secondary | ICD-10-CM | POA: Diagnosis present

## 2012-11-05 DIAGNOSIS — M1710 Unilateral primary osteoarthritis, unspecified knee: Principal | ICD-10-CM | POA: Diagnosis present

## 2012-11-05 DIAGNOSIS — E039 Hypothyroidism, unspecified: Secondary | ICD-10-CM | POA: Diagnosis present

## 2012-11-05 DIAGNOSIS — D500 Iron deficiency anemia secondary to blood loss (chronic): Secondary | ICD-10-CM | POA: Diagnosis not present

## 2012-11-05 DIAGNOSIS — E669 Obesity, unspecified: Secondary | ICD-10-CM | POA: Diagnosis present

## 2012-11-05 HISTORY — PX: PARTIAL KNEE ARTHROPLASTY: SHX2174

## 2012-11-05 LAB — TYPE AND SCREEN: ABO/RH(D): O POS

## 2012-11-05 SURGERY — ARTHROPLASTY, KNEE, UNICOMPARTMENTAL
Anesthesia: Spinal | Site: Knee | Laterality: Right | Wound class: Clean

## 2012-11-05 MED ORDER — ZOLPIDEM TARTRATE 5 MG PO TABS: 5.0000 mg | ORAL_TABLET | Freq: Every evening | ORAL | Status: DC | PRN

## 2012-11-05 MED ORDER — DEXAMETHASONE SODIUM PHOSPHATE 10 MG/ML IJ SOLN: 10.0000 mg | Freq: Once | INTRAMUSCULAR | Status: DC

## 2012-11-05 MED ORDER — LORATADINE 10 MG PO TABS
10.0000 mg | ORAL_TABLET | Freq: Every day | ORAL | Status: DC
  Administered 2012-11-06: 10 mg via ORAL
  Filled 2012-11-05 (×2): qty 1

## 2012-11-05 MED ORDER — PROPOFOL 10 MG/ML IV BOLUS
INTRAVENOUS | Status: DC | PRN
  Administered 2012-11-05: 40 mg via INTRAVENOUS
  Administered 2012-11-05: 30 mg via INTRAVENOUS

## 2012-11-05 MED ORDER — LEVOTHYROXINE SODIUM 175 MCG PO TABS
175.0000 ug | ORAL_TABLET | Freq: Every day | ORAL | Status: DC
  Administered 2012-11-06: 175 ug via ORAL
  Filled 2012-11-05 (×2): qty 1

## 2012-11-05 MED ORDER — FLEET ENEMA 7-19 GM/118ML RE ENEM: 1.0000 | ENEMA | Freq: Once | RECTAL | Status: AC | PRN

## 2012-11-05 MED ORDER — PHENYLEPHRINE HCL 10 MG/ML IJ SOLN
INTRAMUSCULAR | Status: DC | PRN
  Administered 2012-11-05: 80 ug via INTRAVENOUS
  Administered 2012-11-05 (×2): 40 ug via INTRAVENOUS

## 2012-11-05 MED ORDER — BUPIVACAINE HCL (PF) 0.75 % IJ SOLN
INTRAMUSCULAR | Status: DC | PRN
  Administered 2012-11-05: 15 mg via INTRATHECAL

## 2012-11-05 MED ORDER — POTASSIUM CHLORIDE 2 MEQ/ML IV SOLN
INTRAVENOUS | Status: DC
  Administered 2012-11-05 (×2): via INTRAVENOUS
  Filled 2012-11-05 (×5): qty 1000

## 2012-11-05 MED ORDER — DIPHENHYDRAMINE HCL 25 MG PO CAPS: 25.0000 mg | ORAL_CAPSULE | Freq: Four times a day (QID) | ORAL | Status: DC | PRN

## 2012-11-05 MED ORDER — ONDANSETRON HCL 4 MG/2ML IJ SOLN: 4.0000 mg | Freq: Four times a day (QID) | INTRAMUSCULAR | Status: DC | PRN

## 2012-11-05 MED ORDER — HYDROMORPHONE HCL PF 1 MG/ML IJ SOLN
0.5000 mg | INTRAMUSCULAR | Status: DC | PRN
  Administered 2012-11-05: 1 mg via INTRAVENOUS
  Administered 2012-11-05: 0.5 mg via INTRAVENOUS
  Administered 2012-11-05: 1 mg via INTRAVENOUS
  Filled 2012-11-05 (×3): qty 1

## 2012-11-05 MED ORDER — KETOROLAC TROMETHAMINE 30 MG/ML IJ SOLN
INTRAMUSCULAR | Status: DC | PRN
  Administered 2012-11-05: 30 mg via INTRAMUSCULAR

## 2012-11-05 MED ORDER — CEFAZOLIN SODIUM-DEXTROSE 2-3 GM-% IV SOLR
2.0000 g | INTRAVENOUS | Status: AC
  Administered 2012-11-05: 2 g via INTRAVENOUS

## 2012-11-05 MED ORDER — BUPIVACAINE-EPINEPHRINE 0.25% -1:200000 IJ SOLN
INTRAMUSCULAR | Status: DC | PRN
  Administered 2012-11-05: 30 mL

## 2012-11-05 MED ORDER — SIMVASTATIN 20 MG PO TABS
20.0000 mg | ORAL_TABLET | Freq: Every day | ORAL | Status: DC
  Administered 2012-11-05: 20 mg via ORAL
  Filled 2012-11-05 (×2): qty 1

## 2012-11-05 MED ORDER — ONDANSETRON HCL 4 MG PO TABS: 4.0000 mg | ORAL_TABLET | Freq: Four times a day (QID) | ORAL | Status: DC | PRN

## 2012-11-05 MED ORDER — HETASTARCH-ELECTROLYTES 6 % IV SOLN
INTRAVENOUS | Status: DC | PRN
  Administered 2012-11-05: 08:00:00 via INTRAVENOUS

## 2012-11-05 MED ORDER — DEXAMETHASONE SODIUM PHOSPHATE 10 MG/ML IJ SOLN
10.0000 mg | Freq: Once | INTRAMUSCULAR | Status: AC
  Administered 2012-11-05: 10 mg via INTRAVENOUS

## 2012-11-05 MED ORDER — PHENOL 1.4 % MT LIQD: 1.0000 | OROMUCOSAL | Status: DC | PRN

## 2012-11-05 MED ORDER — MIDAZOLAM HCL 5 MG/5ML IJ SOLN
INTRAMUSCULAR | Status: DC | PRN
  Administered 2012-11-05: 2 mg via INTRAVENOUS

## 2012-11-05 MED ORDER — ACETAMINOPHEN 10 MG/ML IV SOLN
INTRAVENOUS | Status: DC | PRN
  Administered 2012-11-05: 1000 mg via INTRAVENOUS

## 2012-11-05 MED ORDER — HYDROCODONE-ACETAMINOPHEN 7.5-325 MG PO TABS
1.0000 | ORAL_TABLET | ORAL | Status: DC
  Administered 2012-11-05 – 2012-11-06 (×6): 2 via ORAL
  Filled 2012-11-05 (×6): qty 2

## 2012-11-05 MED ORDER — PANTOPRAZOLE SODIUM 40 MG PO TBEC
80.0000 mg | DELAYED_RELEASE_TABLET | Freq: Every day | ORAL | Status: DC
  Administered 2012-11-05 – 2012-11-06 (×2): 80 mg via ORAL
  Filled 2012-11-05 (×2): qty 2

## 2012-11-05 MED ORDER — ONDANSETRON HCL 4 MG/2ML IJ SOLN
INTRAMUSCULAR | Status: DC | PRN
  Administered 2012-11-05: 4 mg via INTRAVENOUS

## 2012-11-05 MED ORDER — STERILE WATER FOR IRRIGATION IR SOLN
Status: DC | PRN
  Administered 2012-11-05: 1500 mL

## 2012-11-05 MED ORDER — DOCUSATE SODIUM 100 MG PO CAPS
100.0000 mg | ORAL_CAPSULE | Freq: Two times a day (BID) | ORAL | Status: DC
  Administered 2012-11-05 – 2012-11-06 (×2): 100 mg via ORAL

## 2012-11-05 MED ORDER — LACTATED RINGERS IV SOLN
INTRAVENOUS | Status: DC
  Administered 2012-11-05: 10:00:00 via INTRAVENOUS

## 2012-11-05 MED ORDER — MENTHOL 3 MG MT LOZG: 1.0000 | LOZENGE | OROMUCOSAL | Status: DC | PRN

## 2012-11-05 MED ORDER — CEFAZOLIN SODIUM-DEXTROSE 2-3 GM-% IV SOLR
2.0000 g | Freq: Four times a day (QID) | INTRAVENOUS | Status: AC
  Administered 2012-11-05 (×2): 2 g via INTRAVENOUS
  Filled 2012-11-05 (×2): qty 50

## 2012-11-05 MED ORDER — ALUM & MAG HYDROXIDE-SIMETH 200-200-20 MG/5ML PO SUSP: 30.0000 mL | ORAL | Status: DC | PRN

## 2012-11-05 MED ORDER — LACTATED RINGERS IV SOLN
INTRAVENOUS | Status: DC | PRN
  Administered 2012-11-05 (×2): via INTRAVENOUS

## 2012-11-05 MED ORDER — BUPROPION HCL ER (XL) 300 MG PO TB24
300.0000 mg | ORAL_TABLET | Freq: Every day | ORAL | Status: DC
  Administered 2012-11-06: 300 mg via ORAL
  Filled 2012-11-05 (×2): qty 1

## 2012-11-05 MED ORDER — FLUOXETINE HCL 20 MG PO CAPS
40.0000 mg | ORAL_CAPSULE | Freq: Every day | ORAL | Status: DC
  Administered 2012-11-06: 40 mg via ORAL
  Filled 2012-11-05 (×2): qty 2

## 2012-11-05 MED ORDER — PROPOFOL 10 MG/ML IV EMUL
INTRAVENOUS | Status: DC | PRN
  Administered 2012-11-05 (×2): 100 ug/kg/min via INTRAVENOUS

## 2012-11-05 MED ORDER — METHOCARBAMOL 500 MG PO TABS
500.0000 mg | ORAL_TABLET | Freq: Four times a day (QID) | ORAL | Status: DC | PRN
  Administered 2012-11-05: 500 mg via ORAL
  Filled 2012-11-05: qty 1

## 2012-11-05 MED ORDER — POLYETHYLENE GLYCOL 3350 17 G PO PACK
17.0000 g | PACK | Freq: Two times a day (BID) | ORAL | Status: DC
  Administered 2012-11-06: 17 g via ORAL

## 2012-11-05 MED ORDER — FENTANYL CITRATE 0.05 MG/ML IJ SOLN
INTRAMUSCULAR | Status: DC | PRN
  Administered 2012-11-05: 100 ug via INTRAVENOUS

## 2012-11-05 MED ORDER — TRANEXAMIC ACID 100 MG/ML IV SOLN
15.0000 mg/kg | Freq: Once | INTRAVENOUS | Status: AC
  Administered 2012-11-05: 1578 mg via INTRAVENOUS
  Filled 2012-11-05: qty 15.78

## 2012-11-05 MED ORDER — METHOCARBAMOL 100 MG/ML IJ SOLN
500.0000 mg | Freq: Four times a day (QID) | INTRAVENOUS | Status: DC | PRN
  Filled 2012-11-05: qty 5

## 2012-11-05 MED ORDER — HYDROMORPHONE HCL PF 1 MG/ML IJ SOLN: 0.2500 mg | INTRAMUSCULAR | Status: DC | PRN

## 2012-11-05 MED ORDER — 0.9 % SODIUM CHLORIDE (POUR BTL) OPTIME
TOPICAL | Status: DC | PRN
  Administered 2012-11-05: 1000 mL

## 2012-11-05 MED ORDER — BISACODYL 10 MG RE SUPP: 10.0000 mg | Freq: Every day | RECTAL | Status: DC | PRN

## 2012-11-05 MED ORDER — CELECOXIB 200 MG PO CAPS
200.0000 mg | ORAL_CAPSULE | Freq: Two times a day (BID) | ORAL | Status: DC
  Administered 2012-11-05 – 2012-11-06 (×3): 200 mg via ORAL
  Filled 2012-11-05 (×4): qty 1

## 2012-11-05 MED ORDER — RIVAROXABAN 10 MG PO TABS
10.0000 mg | ORAL_TABLET | ORAL | Status: DC
  Administered 2012-11-06: 10 mg via ORAL
  Filled 2012-11-05 (×2): qty 1

## 2012-11-05 MED ORDER — FERROUS SULFATE 325 (65 FE) MG PO TABS
325.0000 mg | ORAL_TABLET | Freq: Three times a day (TID) | ORAL | Status: DC
  Administered 2012-11-05 – 2012-11-06 (×4): 325 mg via ORAL
  Filled 2012-11-05 (×6): qty 1

## 2012-11-05 SURGICAL SUPPLY — 51 items
ADH SKN CLS APL DERMABOND .7 (GAUZE/BANDAGES/DRESSINGS) ×1
BAG SPEC THK2 15X12 ZIP CLS (MISCELLANEOUS) ×1
BAG ZIPLOCK 12X15 (MISCELLANEOUS) ×2 IMPLANT
BANDAGE ELASTIC 6 VELCRO ST LF (GAUZE/BANDAGES/DRESSINGS) ×2 IMPLANT
BANDAGE ESMARK 6X9 LF (GAUZE/BANDAGES/DRESSINGS) ×1 IMPLANT
BLADE SAW RECIPROCATING 77.5 (BLADE) ×2 IMPLANT
BLADE SAW SGTL 13.0X1.19X90.0M (BLADE) ×2 IMPLANT
BNDG CMPR 9X6 STRL LF SNTH (GAUZE/BANDAGES/DRESSINGS) ×1
BNDG ESMARK 6X9 LF (GAUZE/BANDAGES/DRESSINGS) ×2
BOWL SMART MIX CTS (DISPOSABLE) ×2 IMPLANT
CEMENT HV SMART SET (Cement) ×2 IMPLANT
CLOTH BEACON ORANGE TIMEOUT ST (SAFETY) ×2 IMPLANT
COVER SURGICAL LIGHT HANDLE (MISCELLANEOUS) ×2 IMPLANT
CUFF TOURN SGL QUICK 34 (TOURNIQUET CUFF) ×2
CUFF TRNQT CYL 34X4X40X1 (TOURNIQUET CUFF) ×1 IMPLANT
DERMABOND ADVANCED (GAUZE/BANDAGES/DRESSINGS) ×1
DERMABOND ADVANCED .7 DNX12 (GAUZE/BANDAGES/DRESSINGS) ×1 IMPLANT
DRAPE EXTREMITY T 121X128X90 (DRAPE) ×2 IMPLANT
DRAPE POUCH INSTRU U-SHP 10X18 (DRAPES) ×2 IMPLANT
DRSG AQUACEL AG ADV 3.5X 6 (GAUZE/BANDAGES/DRESSINGS) ×2 IMPLANT
DRSG AQUACEL AG ADV 3.5X10 (GAUZE/BANDAGES/DRESSINGS) ×1 IMPLANT
DRSG TEGADERM 4X4.75 (GAUZE/BANDAGES/DRESSINGS) ×2 IMPLANT
DURAPREP 26ML APPLICATOR (WOUND CARE) ×2 IMPLANT
ELECT REM PT RETURN 9FT ADLT (ELECTROSURGICAL) ×2
ELECTRODE REM PT RTRN 9FT ADLT (ELECTROSURGICAL) ×1 IMPLANT
EVACUATOR 1/8 PVC DRAIN (DRAIN) ×2 IMPLANT
FACESHIELD LNG OPTICON STERILE (SAFETY) ×8 IMPLANT
GAUZE SPONGE 2X2 8PLY STRL LF (GAUZE/BANDAGES/DRESSINGS) ×1 IMPLANT
GLOVE BIOGEL PI IND STRL 7.5 (GLOVE) ×1 IMPLANT
GLOVE BIOGEL PI IND STRL 8 (GLOVE) IMPLANT
GLOVE BIOGEL PI INDICATOR 7.5 (GLOVE) ×1
GLOVE BIOGEL PI INDICATOR 8 (GLOVE)
GLOVE ORTHO TXT STRL SZ7.5 (GLOVE) ×4 IMPLANT
GOWN BRE IMP PREV XXLGXLNG (GOWN DISPOSABLE) ×4 IMPLANT
GOWN STRL NON-REIN LRG LVL3 (GOWN DISPOSABLE) ×2 IMPLANT
KIT BASIN OR (CUSTOM PROCEDURE TRAY) ×2 IMPLANT
LEGGING LITHOTOMY PAIR STRL (DRAPES) ×2 IMPLANT
MANIFOLD NEPTUNE II (INSTRUMENTS) ×2 IMPLANT
NDL SAFETY ECLIPSE 18X1.5 (NEEDLE) ×1 IMPLANT
NEEDLE HYPO 18GX1.5 SHARP (NEEDLE) ×2
PACK TOTAL JOINT (CUSTOM PROCEDURE TRAY) ×2 IMPLANT
POSITIONER SURGICAL ARM (MISCELLANEOUS) ×2 IMPLANT
SPONGE GAUZE 2X2 STER 10/PKG (GAUZE/BANDAGES/DRESSINGS) ×1
SUCTION FRAZIER TIP 10 FR DISP (SUCTIONS) ×2 IMPLANT
SUT MNCRL AB 4-0 PS2 18 (SUTURE) ×2 IMPLANT
SUT VIC AB 1 CT1 36 (SUTURE) ×4 IMPLANT
SUT VIC AB 2-0 CT1 27 (SUTURE) ×4
SUT VIC AB 2-0 CT1 TAPERPNT 27 (SUTURE) ×2 IMPLANT
SYR 50ML LL SCALE MARK (SYRINGE) ×2 IMPLANT
TOWEL OR 17X26 10 PK STRL BLUE (TOWEL DISPOSABLE) ×4 IMPLANT
TRAY FOLEY CATH 14FRSI W/METER (CATHETERS) ×2 IMPLANT

## 2012-11-05 NOTE — Anesthesia Preprocedure Evaluation (Addendum)
Anesthesia Evaluation  Patient identified by MRN, date of birth, ID band Patient awake    Reviewed: Allergy & Precautions, H&P , NPO status , Patient's Chart, lab work & pertinent test results  Airway Mallampati: II TM Distance: >3 FB Neck ROM: full    Dental No notable dental hx. (+) Teeth Intact and Dental Advisory Given   Pulmonary neg pulmonary ROS,  breath sounds clear to auscultation  Pulmonary exam normal       Cardiovascular Exercise Tolerance: Good negative cardio ROS  Rhythm:regular Rate:Normal     Neuro/Psych negative neurological ROS  negative psych ROS   GI/Hepatic negative GI ROS, Neg liver ROS, GERD-  Medicated and Controlled,  Endo/Other  negative endocrine ROSHypothyroidism Morbid obesity  Renal/GU negative Renal ROS  negative genitourinary   Musculoskeletal   Abdominal   Peds  Hematology negative hematology ROS (+)   Anesthesia Other Findings   Reproductive/Obstetrics negative OB ROS                         Anesthesia Physical Anesthesia Plan  ASA: II  Anesthesia Plan: Spinal   Post-op Pain Management:    Induction:   Airway Management Planned:   Additional Equipment:   Intra-op Plan:   Post-operative Plan:   Informed Consent: I have reviewed the patients History and Physical, chart, labs and discussed the procedure including the risks, benefits and alternatives for the proposed anesthesia with the patient or authorized representative who has indicated his/her understanding and acceptance.   Dental Advisory Given  Plan Discussed with: CRNA and Surgeon  Anesthesia Plan Comments:         Anesthesia Quick Evaluation

## 2012-11-05 NOTE — Anesthesia Postprocedure Evaluation (Signed)
  Anesthesia Post-op Note  Patient: Melissa [name]  Procedure(s) Performed: Procedure(s) (LRB): UNICOMPARTMENTAL KNEE (Right)  Patient Location: PACU  Anesthesia Type: Spinal  Level of Consciousness: awake and alert   Airway and Oxygen Therapy: Patient Spontanous Breathing  Post-op Pain: mild  Post-op Assessment: Post-op Vital signs reviewed, Patient's Cardiovascular Status Stable, Respiratory Function Stable, Patent Airway and No signs of Nausea or vomiting  Last Vitals:  Filed Vitals:   11/05/12 1045  BP: 105/55  Pulse: 66  Temp: 36.2 C  Resp: 16    Post-op Vital Signs: stable   Complications: No apparent anesthesia complications

## 2012-11-05 NOTE — Anesthesia Procedure Notes (Addendum)
Spinal  Start time: 11/05/2012 7:35 AM End time: 11/05/2012 7:45 AM Staffing Anesthesiologist: Ronelle Nigh L Performed by: anesthesiologist  Preanesthetic Checklist Completed: patient identified, site marked, surgical consent, pre-op evaluation, IV checked, risks and benefits discussed and monitors and equipment checked Spinal Block Patient position: sitting Prep: Betadine Patient monitoring: continuous pulse ox and blood pressure Approach: midline Location: L2-3 Injection technique: single-shot Needle Needle type: Whitacre  Needle gauge: 25 G Needle length: 15.2 cm Assessment Sensory level: T4 Additional Notes CRNA attempted prior to Melissa [name]. CSF return x 3 , no paresthsia, negative heme. Pt tolerated procedure well. Lot [id number] exp 09/2013

## 2012-11-05 NOTE — Transfer of Care (Signed)
Immediate Anesthesia Transfer of Care Note  Patient: Melissa [name]  Procedure(s) Performed: Procedure(s) (LRB) with comments: UNICOMPARTMENTAL KNEE (Right)  Patient Location: PACU  Anesthesia Type:Spinal  Level of Consciousness: awake, alert , oriented and patient cooperative  Airway & Oxygen Therapy: Patient Spontanous Breathing and Patient connected to face mask oxygen  Post-op Assessment: Report given to PACU RN and Post -op Vital signs reviewed and stable  Post vital signs: stable  Complications: No apparent anesthesia complications  T 6 level spinal

## 2012-11-05 NOTE — Op Note (Signed)
NAME: Melissa [name]    MEDICAL RECORD NO.: [id number]   FACILITY: [facility]   DATE OF BIRTH: 1966-08-13  PHYSICIAN: Madlyn Frankel. Charlann Boxer, M.D.    DATE OF PROCEDURE: 11/05/2012    OPERATIVE REPORT   PREOPERATIVE DIAGNOSIS: Right knee medial compartment osteoarthritis.   POSTOPERATIVE DIAGNOSIS: Right knee medial compartment osteoarthritis.  PROCEDURE: Right partial knee replacement utilizing Biomet Oxford knee  component, size small femur, a right medial size AA tibial tray with a size 4 medial small right insert.   SURGEON: Madlyn Frankel. Charlann Boxer, M.D.   ASSISTANT: Lanney Gins, PAC.  Please note that Mr. Melissa [name] was present for the entirety of the case,  utilized for preoperative positioning, perioperative retractor  management, general facilitation of the case and primary wound closure.   ANESTHESIA: Spinal.   SPECIMENS: None.   COMPLICATIONS: None.  DRAINS: 1 medium HV   TOURNIQUET TIME: 41 minutes at 250 mmHg.   INDICATIONS FOR PROCEDURE: Melissa [name] is 47 yo female patient referred for evaluation of right knee pain.  They presented with primary complaints of pain on the medial side of their knee. Radiographs revealed advanced medial compartment arthritis with specifically an antero-medial wear pattern.  There was bone on bone changes noted with subchondral sclerosis and osteophytes present. The patient has had progressive problems failing to respond to conservative measures of medications, injections and activity modification. Risks of infection, DVT, component failure, need for future revision surgery were all discussed and reviewed.  Consent was obtained for benefit of pain relief.   PROCEDURE IN DETAIL: The patient was brought to the operative theater.  Once adequate anesthesia, preoperative antibiotics, 2gm Ancef administered, the patient was positioned in supine position with a right thigh tourniquet  placed. The right lower extremity was prepped and draped in sterile  fashion with  the leg on the Oxford leg holder.  The leg was allowed to flex to 120 degrees. A time-out  was performed identifying the patient, planned procedure, and extremity.  The leg was exsanguinated, tourniquet elevated to 250 mmHg. A midline  incision was made from the proximal pole of the patella to the tibial tubercle. A  soft tissue plane was created and partial median arthrotomy was then  made to allow for subluxation of the patella. Following initial synovectomy and  debridement, the osteophytes were removed off the medial aspect of the  knee.   Attention was first directed to the tibia. The tibial  extramedullary guide was positioned over the anterior crest of the tibia  and pinned into position, and using a measured resection guide from the  Oxford system, a 4 mm resection was made off the proximal tibia. First  the reciprocating saw along the medial aspect of the tibial spines, then the oscillating saw.    At this point, I sized this cut surface seem to be best fit for a size AA tibial tray.  With the retractors out of the wound and the knee held at 90 degrees the 4 feeler gauge had appropriate tension on the medial ligament.   At this point, the femoral canal was opened with a drill and the  intramedullary rod passed. Then using the guide for a small femoral resection off  the posterior aspect of the femur was positioned over the mid portion of the medial femoral condyle.  The orientation was set using the guide that mates the femoral guide to the intramedullary rod.  The 2 drill holes were made into the distal femur.  The posterior guide was  then impacted into place and the posterior  femoral cut made.  At this point, I milled the distal femur with a size 4 spigot in place. At this point, we did a trial reduction of the small femur, size AA tibial tray and a 4 feeler gauge. At 90 degrees of  flexion and at 20 degrees of flexion the knee had symmetric tension on  the ligaments.   Given  these findings, the trial femoral component was removed. Final preparation of tibia was carried out by pinning it in position. Then  using a reciprocating saw I removed bone for the keel. Further bone was  removed with an osteotome.  Trial reduction was now carried out with the small femur, the AA keeled tibial tray, and a size 4 lollipop insert. The balance of the  ligaments appeared to be symmetric at 20 degrees and 90 degrees. Given  all these findings, the trial components were removed.   Cement was mixed. The final components were opened. The knee was irrigated with  normal saline solution. Then final debridements of the  soft tissue was carried out, I also drilled the sclerotic bone with a drill.  The final components were cemented with a single batch of cement in a  two-stage technique with the tibial component cemented first. The knee  was then brought  to 45 degrees of flexion with a 4 feeler gauge, held with pressure for a minute and half.  After this the femoral component was cemented in place.  The knee was again held at 45 degrees of flexion while the cement fully cured.  Excess cement was removed throughout the knee. Tourniquet was let down  after 41 minutes. After the cement had fully cured and excessive cement  was removed throughout the knee there was no visualized cement present.   The final size 4 right medial insert was chosen and snapped into position. We re-irrigated  the knee. I placed a medium Hemovac drain deep. The extensor mechanism  was then reapproximated using a #1 Vicryl with the knee in flexion. The  remaining wound was closed with 2-0 Vicryl and a running 4-0 Monocryl.  The knee was cleaned, dried, and dressed sterilely using Dermabond and  Aquacel dressing. The drain site was dressed separately. The patient  was brought to the recovery room, Ace wrap in place, tolerating the  procedure well. He will be in the hospital for overnight observation.  We will  initiate physical therapy and progress to ambulate.     Madlyn Frankel Charlann Boxer, M.D.

## 2012-11-05 NOTE — Plan of Care (Signed)
Problem: Consults Goal: Diagnosis- Total Joint Replacement Right uni knee

## 2012-11-05 NOTE — Interval H&P Note (Signed)
History and Physical Interval Note:  11/05/2012 7:14 AM  Melissa [name]  has presented today for surgery, with the diagnosis of RIGHT KNEE MEDIAL COMPARTMENTAL OA  The various methods of treatment have been discussed with the patient and family. After consideration of risks, benefits and other options for treatment, the patient has consented to  Procedure(s) (LRB) with comments: UNICOMPARTMENTAL KNEE (Right) as a surgical intervention .  The patient's history has been reviewed, patient examined, no change in status, stable for surgery.  I have reviewed the patient's chart and labs.  Questions were answered to the patient's satisfaction.     [name]

## 2012-11-05 NOTE — Evaluation (Signed)
Physical Therapy Evaluation Patient Details Name: Melissa [name] MRN: [id number] DOB: Mar 25, 1966 Today's Date: 11/05/2012 Time: 5621-3086 PT Time Calculation (min): 31 min  PT Assessment / Plan / Recommendation Clinical Impression  Pt presents s/p R UKR POD 0 with decreased strength, ROM and mobility.  Tolerated OOB and ambulation in hallway very well with no c/o dizziness/light headedness during session.  Pt will benefit from skilled PT in acute venue to address deficits.  PT recommends HHPT for follow up at D/C to maximize pts independence.     PT Assessment  Patient needs continued PT services    Follow Up Recommendations  Home health PT    Does the patient have the potential to tolerate intense rehabilitation      Barriers to Discharge None      Equipment Recommendations  None recommended by PT    Recommendations for Other Services OT consult   Frequency 7X/week    Precautions / Restrictions Precautions Precautions: Knee Required Braces or Orthoses: Knee Immobilizer - Right Knee Immobilizer - Right: Discontinue once straight leg raise with < 10 degree lag Restrictions Weight Bearing Restrictions: No Other Position/Activity Restrictions: WBAT   Pertinent Vitals/Pain 1-2/10 pain with ambulation, ice packs applied      Mobility  Bed Mobility Bed Mobility: Supine to Sit Supine to Sit: 4: Min assist Details for Bed Mobility Assistance: Assist for RLE out of bed with cues for hand placement and technique to self assist.  Transfers Transfers: Sit to Stand;Stand to Sit Sit to Stand: 4: Min assist;From elevated surface;With upper extremity assist;From bed Stand to Sit: 4: Min assist;With upper extremity assist;With armrests;To chair/3-in-1 Details for Transfer Assistance: Assist to rise and steady with min cues for hand placement/LE management.  Ambulation/Gait Ambulation/Gait Assistance: 4: Min guard;4: Min Environmental consultant (Feet): 65 Feet Assistive  device: Rolling walker Ambulation/Gait Assistance Details: Cues for sequencing/technique with RW and to maintain upright posture throughout.  Gait Pattern: Step-to pattern;Decreased stance time - right;Decreased step length - left Gait velocity: decreased Stairs: No Wheelchair Mobility Wheelchair Mobility: No    Shoulder Instructions     Exercises     PT Diagnosis: Difficulty walking;Generalized weakness;Acute pain  PT Problem List: Decreased strength;Decreased range of motion;Decreased activity tolerance;Decreased balance;Decreased mobility;Decreased knowledge of use of DME;Decreased knowledge of precautions;Pain PT Treatment Interventions: DME instruction;Gait training;Stair training;Functional mobility training;Therapeutic activities;Balance training;Therapeutic exercise;Patient/family education   PT Goals Acute Rehab PT Goals PT Goal Formulation: With patient Time For Goal Achievement: 11/07/12 Potential to Achieve Goals: Good Pt will go Supine/Side to Sit: with supervision PT Goal: Supine/Side to Sit - Progress: Goal set today Pt will go Sit to Supine/Side: with supervision PT Goal: Sit to Supine/Side - Progress: Goal set today Pt will go Sit to Stand: with supervision PT Goal: Sit to Stand - Progress: Goal set today Pt will go Stand to Sit: with supervision PT Goal: Stand to Sit - Progress: Goal set today Pt will Ambulate: 51 - 150 feet;with supervision;with least restrictive assistive device PT Goal: Ambulate - Progress: Goal set today Pt will Go Up / Down Stairs: 3-5 stairs;with supervision;with least restrictive assistive device;with rail(s) PT Goal: Up/Down Stairs - Progress: Goal set today  Visit Information  Last PT Received On: 11/05/12 Assistance Needed: +1    Subjective Data  Subjective: We were just talking about you.  Patient Stated Goal: to get back to normal   Prior Functioning  Home Living Lives With: Family Available Help at Discharge: Available 24  hours/day Type of  Home: House Home Access: Stairs to enter [employer] of Steps: 5 Entrance Stairs-Rails: Right;Left Home Layout: One level Bathroom Shower/Tub: Walk-in shower;Tub only Bathroom Toilet: Standard Bathroom Accessibility: Yes How Accessible: Accessible via walker Home Adaptive Equipment: Crutches;Straight cane;Walker - rolling Prior Function Level of Independence: Independent Able to Take Stairs?: Yes Driving: Yes Vocation: Full time employment Communication Communication: No difficulties    Cognition  Overall Cognitive Status: Appears within functional limits for tasks assessed/performed Arousal/Alertness: Awake/alert Orientation Level: Appears intact for tasks assessed Behavior During Session: [facility] for tasks performed    Extremity/Trunk Assessment Right Lower Extremity Assessment RLE ROM/Strength/Tone: Deficits RLE ROM/Strength/Tone Deficits: ankle motions WFL, pt able to perform SLR without assist, however donned KI during session for safety since POD 0 RLE Sensation: WFL - Light Touch Left Lower Extremity Assessment LLE ROM/Strength/Tone: WFL for tasks assessed LLE Sensation: WFL - Light Touch Trunk Assessment Trunk Assessment: Normal   Balance    End of Session PT - End of Session Equipment Utilized During Treatment: Gait belt;Right knee immobilizer Activity Tolerance: Patient tolerated treatment well Patient left: in chair;with call bell/phone within reach;with family/visitor present Nurse Communication: Mobility status  GP     Vista Deck 11/05/2012, 4:21 PM

## 2012-11-06 ENCOUNTER — Encounter (HOSPITAL_COMMUNITY): Payer: Self-pay | Admitting: Orthopedic Surgery

## 2012-11-06 DIAGNOSIS — D500 Iron deficiency anemia secondary to blood loss (chronic): Secondary | ICD-10-CM | POA: Diagnosis not present

## 2012-11-06 DIAGNOSIS — E669 Obesity, unspecified: Secondary | ICD-10-CM | POA: Diagnosis present

## 2012-11-06 LAB — BASIC METABOLIC PANEL
GFR calc Af Amer: 87 mL/min — ABNORMAL LOW (ref >90)
GFR calc non Af Amer: 75 mL/min — ABNORMAL LOW (ref >90)
Potassium: 3.8 mEq/L (ref 3.5–5.1)
Sodium: 137 mEq/L (ref 135–145)

## 2012-11-06 LAB — CBC
Hemoglobin: 10.6 g/dL — ABNORMAL LOW (ref 12.0–15.0)
MCHC: 31.8 g/dL (ref 30.0–36.0)
Platelets: 309 10*3/uL (ref 150–400)
RDW: 13.6 % (ref 11.5–15.5)

## 2012-11-06 MED ORDER — HYDROCODONE-ACETAMINOPHEN 7.5-325 MG PO TABS: 1.0000 | ORAL_TABLET | ORAL | Status: DC | PRN

## 2012-11-06 MED ORDER — DSS 100 MG PO CAPS: 100.0000 mg | ORAL_CAPSULE | Freq: Two times a day (BID) | ORAL | Status: DC

## 2012-11-06 MED ORDER — DIPHENHYDRAMINE HCL 25 MG PO CAPS: 25.0000 mg | ORAL_CAPSULE | Freq: Four times a day (QID) | ORAL | Status: DC | PRN

## 2012-11-06 MED ORDER — FERROUS SULFATE 325 (65 FE) MG PO TABS: 325.0000 mg | ORAL_TABLET | Freq: Three times a day (TID) | ORAL | Status: DC

## 2012-11-06 MED ORDER — METHOCARBAMOL 500 MG PO TABS: 500.0000 mg | ORAL_TABLET | Freq: Four times a day (QID) | ORAL | Status: DC | PRN

## 2012-11-06 MED ORDER — POLYETHYLENE GLYCOL 3350 17 G PO PACK: 17.0000 g | PACK | Freq: Two times a day (BID) | ORAL | Status: DC

## 2012-11-06 MED ORDER — ASPIRIN EC 325 MG PO TBEC: 325.0000 mg | DELAYED_RELEASE_TABLET | Freq: Two times a day (BID) | ORAL | Status: DC

## 2012-11-06 NOTE — Discharge Summary (Signed)
Physician Discharge Summary  Patient ID: Melissa [name] MRN: [id number] DOB/AGE: [birth year] 49 y.o.  Admit date: 11/05/2012 Discharge date:  11/06/2012  Procedures:  Procedure(s) (LRB): UNICOMPARTMENTAL KNEE (Right)  Attending Physician:  Dr. Durene Romans   Admission Diagnoses:   Right knee OA / pain  Discharge Diagnoses:  Principal Problem:  *S/P right UKR Active Problems:  Expected blood loss anemia  Obese Hypothyroidism   GERD (gastroesophageal reflux disease)   Anxiety   Depression   Seasonal allergies   Arthritis  HPI: Micheal Likens, 47 y.o. female, has a history of pain and functional disability in the right knee due to arthritis and has failed non-surgical conservative treatments for greater than 12 weeks to includeNSAID's and/or analgesics, corticosteriod injections and activity modification. Onset of symptoms was gradual, starting >10 years ago with gradually worsening course since that time. The patient noted prior procedures on the knee to include arthroscopy on the right knee(s). Patient currently rates pain in the right knee(s) at 10 out of 10 with activity. Patient has worsening of pain with activity and weight bearing, pain that interferes with activities of daily living, pain with passive range of motion, crepitus and joint swelling. Patient has evidence of periarticular osteophytes and joint space narrowing by imaging studies. There is no active infection. Risks, benefits and expectations were discussed with the patient. Patient understand the risks, benefits and expectations and wishes to proceed with surgery.  PCP: Elizabeth Palau, FNP   Discharged Condition: good  Hospital Course:  Patient underwent the above stated procedure on 11/05/2012. Patient tolerated the procedure well and brought to the recovery room in good condition and subsequently to the floor.  POD #1 BP: 124/86 ; Pulse: 75 ; Temp: 97.9 F (36.6 C) ; Resp: 17  Pt's foley was removed, as  well as the hemovac drain removed. IV was changed to a saline lock. Patient reports pain as mild, pain well controlled. No events throughout the night. Ready to be discharged home. Neurovascular intact, dorsiflexion/plantar flexion intact, incision: dressing C/D/I, no cellulitis present and compartment soft.   LABS  Basename  11/06/12 0420   HGB  10.6  HCT  33.3    Discharge Exam: General appearance: alert, cooperative and no distress Extremities: Homans sign is negative, no sign of DVT, no edema, redness or tenderness in the calves or thighs and no ulcers, gangrene or trophic changes  Disposition:  Home or Self Care with follow up in 2 weeks   Follow-up Information    Follow up with [name], MD. In 2 weeks.   Contact information:   [address] [place] [address] [phone]          Discharge Orders    Future Orders Please Complete By Expires   Diet - low sodium heart healthy      Call MD / Call 911      Comments:   If you experience chest pain or shortness of breath, CALL 911 and be transported to the hospital emergency room.  If you develope a fever above 101 F, pus (white drainage) or increased drainage or redness at the wound, or calf pain, call your surgeon's office.   Discharge instructions      Comments:   Maintain surgical dressing for 10-14 days, then replace with gauze and tape. Keep the area dry and clean until follow up. Follow up in 2 weeks at [facility]. Call with any questions or concerns.   Constipation Prevention  Comments:   Drink plenty of fluids.  Prune juice may be helpful.  You may use a stool softener, such as Colace (over the counter) 100 mg twice a day.  Use MiraLax (over the counter) for constipation as needed.   Increase activity slowly as tolerated      Weight bearing as tolerated      TED hose      Comments:   Use stockings (TED hose) for 2 weeks on both leg(s).  You may remove them at night for  sleeping.   Change dressing      Comments:   Maintain surgical dressing for 10-14 days, then change the dressing daily with sterile 4 x 4 inch gauze dressing and tape. Keep the area dry and clean.      Current Discharge Medication List    START taking these medications   Details  aspirin EC 325 MG tablet Take 1 tablet (325 mg total) by mouth 2 (two) times daily. X 4 weeks Qty: 60 tablet, Refills: 0    diphenhydrAMINE (BENADRYL) 25 mg capsule Take 1 capsule (25 mg total) by mouth every 6 (six) hours as needed for itching, allergies or sleep. Qty: 30 capsule    docusate sodium 100 MG CAPS Take 100 mg by mouth 2 (two) times daily. Qty: 10 capsule    ferrous sulfate 325 (65 FE) MG tablet Take 1 tablet (325 mg total) by mouth 3 (three) times daily after meals.    HYDROcodone-acetaminophen (NORCO) 7.5-325 MG per tablet Take 1-2 tablets by mouth every 4 (four) hours as needed for pain. Qty: 120 tablet, Refills: 0    methocarbamol (ROBAXIN) 500 MG tablet Take 1 tablet (500 mg total) by mouth every 6 (six) hours as needed (muscle spasms).    polyethylene glycol (MIRALAX / GLYCOLAX) packet Take 17 g by mouth 2 (two) times daily. Qty: 14 each      CONTINUE these medications which have NOT CHANGED   Details  buPROPion (WELLBUTRIN XL) 300 MG 24 hr tablet Take 300 mg by mouth daily before breakfast.     calcium-vitamin D (OSCAL WITH D) 500-200 MG-UNIT per tablet Take 1 tablet by mouth daily.    fish oil-omega-3 fatty acids 1000 MG capsule Take 1 g by mouth daily.     Flaxseed, Linseed, (FLAX SEED OIL PO) Take 1 tablet by mouth daily.    FLUoxetine (PROZAC) 40 MG capsule Take 40 mg by mouth daily before breakfast.     levothyroxine (SYNTHROID, LEVOTHROID) 175 MCG tablet Take 175 mcg by mouth daily before breakfast.     loratadine (CLARITIN) 10 MG tablet Take 10 mg by mouth daily.    omeprazole (PRILOSEC) 40 MG capsule Take 40 mg by mouth every evening.     pravastatin (PRAVACHOL) 40  MG tablet Take 40 mg by mouth every evening.    cholecalciferol (VITAMIN D) 1000 UNITS tablet Take 2,000 Units by mouth daily.       STOP taking these medications     acetaminophen (TYLENOL) 325 MG tablet Comments:  Reason for Stopping:       meloxicam (MOBIC) 15 MG tablet Comments:  Reason for Stopping:           Signed: Anastasio Auerbach. Junko Ohagan   PAC  11/06/2012, 7:58 AM

## 2012-11-06 NOTE — Progress Notes (Signed)
Physical Therapy Treatment Patient Details Name: Melissa [name] MRN: [id number] DOB: 1966-05-31 Today's Date: 11/06/2012 Time: 4098-1191 PT Time Calculation (min): 25 min  PT Assessment / Plan / Recommendation Comments on Treatment Session  Pt progressing well with mobility and did well with stair training/exercises.  Ready for D/C.     Follow Up Recommendations  Home health PT     Does the patient have the potential to tolerate intense rehabilitation     Barriers to Discharge        Equipment Recommendations  None recommended by PT    Recommendations for Other Services    Frequency 7X/week   Plan Discharge plan remains appropriate    Precautions / Restrictions Precautions Precautions: Knee Required Braces or Orthoses: Knee Immobilizer - Right Knee Immobilizer - Right: Discontinue once straight leg raise with < 10 degree lag (D/C'd KI due to pt can do SLR) Restrictions Weight Bearing Restrictions: No Other Position/Activity Restrictions: WBAT   Pertinent Vitals/Pain 5/10 pain, ice packs applied    Mobility  Bed Mobility Bed Mobility: Supine to Sit Supine to Sit: 5: Supervision Details for Bed Mobility Assistance: Supervision for safety with min cues for technique.  Transfers Transfers: Sit to Stand;Stand to Sit Sit to Stand: From elevated surface;5: Supervision;From bed Stand to Sit: 5: Supervision;With armrests;To chair/3-in-1 Details for Transfer Assistance: Supervision for safety with min cues for hand placement.  Ambulation/Gait Ambulation/Gait Assistance: 5: Supervision Ambulation Distance (Feet): 300 Feet Assistive device: Rolling walker Ambulation/Gait Assistance Details: Min cues for upright posture and maintaining position inside of RW.  Gait Pattern: Step-to pattern;Decreased stance time - right;Decreased step length - left Gait velocity: decreased Stairs: Yes Stairs Assistance: 4: Min guard Stair Management Technique: One rail Right;Step to  pattern;Forwards;With crutches (single crutch) Number of Stairs: 4  Wheelchair Mobility Wheelchair Mobility: No    Exercises Total Joint Exercises Ankle Circles/Pumps: AROM;Both;20 reps Quad Sets: Strengthening;Right;10 reps Heel Slides: AAROM;Right;10 reps Hip ABduction/ADduction: Strengthening;Right;10 reps Straight Leg Raises: Strengthening;Right;10 reps   PT Diagnosis:    PT Problem List:   PT Treatment Interventions:     PT Goals Acute Rehab PT Goals PT Goal Formulation: With patient Time For Goal Achievement: 11/07/12 Potential to Achieve Goals: Good Pt will go Supine/Side to Sit: with supervision PT Goal: Supine/Side to Sit - Progress: Met Pt will go Sit to Stand: with supervision PT Goal: Sit to Stand - Progress: Met Pt will go Stand to Sit: with supervision PT Goal: Stand to Sit - Progress: Met Pt will Ambulate: 51 - 150 feet;with supervision;with least restrictive assistive device PT Goal: Ambulate - Progress: Met Pt will Go Up / Down Stairs: 3-5 stairs;with supervision;with least restrictive assistive device;with rail(s) PT Goal: Up/Down Stairs - Progress: Partly met  Visit Information  Last PT Received On: 11/06/12 Assistance Needed: +1    Subjective Data  Subjective: I'm not doing as well as I was yesterday.  Patient Stated Goal: to get back to normal   Cognition  Overall Cognitive Status: Appears within functional limits for tasks assessed/performed Arousal/Alertness: Awake/alert Orientation Level: Appears intact for tasks assessed Behavior During Session: [facility] for tasks performed    Balance     End of Session PT - End of Session Activity Tolerance: Patient tolerated treatment well Patient left: in chair;with call bell/phone within reach;with family/visitor present Nurse Communication: Mobility status   GP     Vista Deck 11/06/2012, 9:07 AM

## 2012-11-06 NOTE — Evaluation (Signed)
Occupational Therapy Evaluation Patient Details Name: Melissa [name] MRN: [id number] DOB: [birth year] Today's Date: 11/06/2012 Time: 2130-8657 OT Time Calculation (min): 19 min  OT Assessment / Plan / Recommendation Clinical Impression  This 47 year old female was admitted for R uniknee.  All education was completed.  Pt does not need any further OT nor DME.      OT Assessment  Patient does not need any further OT services    Follow Up Recommendations  No OT follow up    Barriers to Discharge      Equipment Recommendations  None recommended by OT    Recommendations for Other Services    Frequency       Precautions / Restrictions Precautions Precautions: Knee Required Braces or Orthoses: Knee Immobilizer - Right Knee Immobilizer - Right: Discontinue once straight leg raise with < 10 degree lag (met) Restrictions Weight Bearing Restrictions: No Other Position/Activity Restrictions: WBAT   Pertinent Vitals/Pain RLE sore--repositioned and applied ice    ADL  Transfers/Ambulation Related to ADLs: ambulated to bathroom at supervision level.  Pt was able to sit on and get up from 16" surface with walker for support (to simulate standard commode).  Pt verbalizes shower sequence:  did not feel she needed to practice.  ADL Comments: Pt completed adl in bathroom at supervision/set up level.  She did not don socks/shoes but donned rest of clothes.  Mother will help as needed.    OT Diagnosis:    OT Problem List:   OT Treatment Interventions:     OT Goals    Visit Information  Last OT Received On: 11/06/12 Assistance Needed: +1    Subjective Data  Subjective: I have a low commode--it has a wall and shower next to it Patient Stated Goal: home with mother's help   Prior Functioning     Home Living Lives With: Family Available Help at Discharge: Available 24 hours/day Bathroom Shower/Tub: Walk-in shower;Tub only Bathroom Toilet: Standard Prior Function Level of  Independence: Independent Able to Take Stairs?: Yes Driving: Yes Vocation: Full time employment Communication Communication: No difficulties         Vision/Perception     Cognition  Overall Cognitive Status: Appears within functional limits for tasks assessed/performed Arousal/Alertness: Awake/alert Orientation Level: Appears intact for tasks assessed Behavior During Session: [facility] for tasks performed    Extremity/Trunk Assessment Right Upper Extremity Assessment RUE ROM/Strength/Tone: [facility] for tasks assessed Left Upper Extremity Assessment LUE ROM/Strength/Tone: WFL for tasks assessed     Mobility Bed Mobility Bed Mobility: Supine to Sit Supine to Sit: 5: Supervision Details for Bed Mobility Assistance: Supervision for safety with min cues for technique.  Transfers Sit to Stand: From elevated surface;5: Supervision;From bed Stand to Sit: 5: Supervision;With upper extremity assist;To chair/3-in-1;To toilet Details for Transfer Assistance: Supervision for safety with min cues for hand placement.      Shoulder Instructions     Exercise    Balance     End of Session OT - End of Session Activity Tolerance: Patient tolerated treatment well Patient left: in bed;with call bell/phone within reach;with family/visitor present  GO     Tashima Scarpulla 11/06/2012, 10:15 AM Marica Otter, OTR/L [phone] 11/06/2012

## 2012-11-06 NOTE — Progress Notes (Signed)
   Subjective: 1 Day Post-Op Procedure(s) (LRB): UNICOMPARTMENTAL KNEE (Right)   Patient reports pain as mild, pain well controlled. No events throughout the night. Ready to be discharged home.  Objective:   VITALS:   Filed Vitals:   11/06/12  BP: 124/86  Pulse: 75  Temp: 97.9 F (36.6 C)   Resp: 17    Neurovascular intact Dorsiflexion/Plantar flexion intact Incision: dressing C/D/I No cellulitis present Compartment soft  LABS  Basename 11/06/12 0420  HGB 10.6*  HCT 33.3*  WBC 12.7*  PLT 309     Basename 11/06/12 0420  NA 137  K 3.8  BUN 11  CREATININE 0.89  GLUCOSE 133*     Assessment/Plan: 1 Day Post-Op Procedure(s) (LRB): UNICOMPARTMENTAL KNEE (Right) HV drain d/c'ed Foley cath d/c'ed Advance diet Up with therapy D/C IV fluids Discharge home with home health Follow up in 2 weeks at [facility]. Follow up with OLIN,Salvatore Shear D in 2 weeks.  Contact information:  [facility] [address], [address] [place] [address] [phone]     Expected ABLA  Treated with iron and will observe  Obese (BMI 30-39.9) Estimated Body mass index is 39.82 kg/(m^2) as calculated from the following:   Height as of this encounter: 5\' 4" (1.626 m).   Weight as of this encounter: 232 lb(105.235 kg). Patient also counseled that weight may inhibit the healing process Patient counseled that losing weight will help with future health issues      Anastasio Auerbach. Raidyn Breiner   PAC  11/06/2012, 7:51 AM

## 2012-11-06 NOTE — Care Management Note (Signed)
    Page 1 of 1   11/06/2012     3:45:56 PM   CARE MANAGEMENT NOTE 11/06/2012  Patient:  Melissa [name], Melissa [name]   Account Number:  [id number]  Date Initiated:  11/06/2012  Documentation initiated by:  Colleen Can  Subjective/Objective Assessment:   dx partial knee replacemnt-right     Action/Plan:   CM spoke with patient. Plans are for patient to return to her home in [facility] where mother will be caregiver. She already has RW and crutches. Plans to use [facility] for [facility] services   Anticipated DC Date:  11/06/2012   Anticipated DC Plan:  HOME W HOME HEALTH SERVICES  In-house referral  NA      DC Planning Services  CM consult      [facility] Choice  HOME HEALTH   Choice offered to / List presented to:  C-1 Patient        HH arranged  HH-2 PT      [facility] agency  [facility]   Status of service:  Completed, signed off Medicare Important Message given?  NO (If response is "NO", the following Medicare IM given date fields will be blank) Date Medicare IM given:   Date Additional Medicare IM given:    Discharge Disposition:  HOME W HOME HEALTH SERVICES  Per UR Regulation:  Reviewed for med. necessity/level of care/duration of stay  If discussed at Long Length of Stay Meetings, dates discussed:    Comments:  11/06/2012 Colleen Can BSN RN CCM [phone] [facility] Care will start services tomorrow 11/07/2012.

## 2014-08-05 DIAGNOSIS — G4733 Obstructive sleep apnea (adult) (pediatric): Secondary | ICD-10-CM | POA: Insufficient documentation

## 2015-04-11 ENCOUNTER — Encounter (HOSPITAL_COMMUNITY): Payer: Self-pay | Admitting: Nurse Practitioner

## 2015-04-11 ENCOUNTER — Emergency Department (HOSPITAL_COMMUNITY)
Admission: EM | Admit: 2015-04-11 | Discharge: 2015-04-11 | Disposition: A | Discharge status: Home / Self Care | Payer: BLUE CROSS/BLUE SHIELD | Attending: Emergency Medicine | Admitting: Emergency Medicine

## 2015-04-11 DIAGNOSIS — E039 Hypothyroidism, unspecified: Secondary | ICD-10-CM | POA: Diagnosis not present

## 2015-04-11 DIAGNOSIS — M1990 Unspecified osteoarthritis, unspecified site: Secondary | ICD-10-CM | POA: Diagnosis not present

## 2015-04-11 DIAGNOSIS — Z79899 Other long term (current) drug therapy: Secondary | ICD-10-CM | POA: Insufficient documentation

## 2015-04-11 DIAGNOSIS — R197 Diarrhea, unspecified: Secondary | ICD-10-CM

## 2015-04-11 DIAGNOSIS — R109 Unspecified abdominal pain: Secondary | ICD-10-CM

## 2015-04-11 DIAGNOSIS — F419 Anxiety disorder, unspecified: Secondary | ICD-10-CM | POA: Insufficient documentation

## 2015-04-11 DIAGNOSIS — Z7982 Long term (current) use of aspirin: Secondary | ICD-10-CM | POA: Diagnosis not present

## 2015-04-11 DIAGNOSIS — F329 Major depressive disorder, single episode, unspecified: Secondary | ICD-10-CM | POA: Insufficient documentation

## 2015-04-11 DIAGNOSIS — K219 Gastro-esophageal reflux disease without esophagitis: Secondary | ICD-10-CM | POA: Diagnosis not present

## 2015-04-11 LAB — COMPREHENSIVE METABOLIC PANEL
ALT: 17 U/L (ref 14–54)
AST: 19 U/L (ref 15–41)
Albumin: 3.6 g/dL (ref 3.5–5.0)
Alkaline Phosphatase: 81 U/L (ref 38–126)
Anion gap: 8 (ref 5–15)
BILIRUBIN TOTAL: 0.2 mg/dL — AB (ref 0.3–1.2)
BUN: 10 mg/dL (ref 6–20)
CHLORIDE: 107 mmol/L (ref 101–111)
CO2: 24 mmol/L (ref 22–32)
CREATININE: 0.82 mg/dL (ref 0.44–1.00)
Calcium: 9.2 mg/dL (ref 8.9–10.3)
GLUCOSE: 115 mg/dL — AB (ref 65–99)
Potassium: 4 mmol/L (ref 3.5–5.1)
Sodium: 139 mmol/L (ref 135–145)
Total Protein: 6.6 g/dL (ref 6.5–8.1)

## 2015-04-11 LAB — CBC WITH DIFFERENTIAL/PLATELET
BASOS PCT: 1 % (ref 0–1)
Basophils Absolute: 0.1 10*3/uL (ref 0.0–0.1)
EOS ABS: 0.5 10*3/uL (ref 0.0–0.7)
Eosinophils Relative: 5 % (ref 0–5)
HCT: 38.1 % (ref 36.0–46.0)
Hemoglobin: 12.3 g/dL (ref 12.0–15.0)
Lymphocytes Relative: 32 % (ref 12–46)
Lymphs Abs: 2.8 10*3/uL (ref 0.7–4.0)
MCH: 28 pg (ref 26.0–34.0)
MCHC: 32.3 g/dL (ref 30.0–36.0)
MCV: 86.8 fL (ref 78.0–100.0)
MONO ABS: 0.7 10*3/uL (ref 0.1–1.0)
MONOS PCT: 8 % (ref 3–12)
NEUTROS ABS: 4.7 10*3/uL (ref 1.7–7.7)
Neutrophils Relative %: 55 % (ref 43–77)
Platelets: 371 10*3/uL (ref 150–400)
RBC: 4.39 MIL/uL (ref 3.87–5.11)
RDW: 14.6 % (ref 11.5–15.5)
WBC: 8.7 10*3/uL (ref 4.0–10.5)

## 2015-04-11 LAB — LIPASE, BLOOD: Lipase: 16 U/L — ABNORMAL LOW (ref 22–51)

## 2015-04-11 LAB — URINALYSIS, ROUTINE W REFLEX MICROSCOPIC
Bilirubin Urine: NEGATIVE
GLUCOSE, UA: NEGATIVE mg/dL
HGB URINE DIPSTICK: NEGATIVE
Ketones, ur: NEGATIVE mg/dL
LEUKOCYTES UA: NEGATIVE
Nitrite: NEGATIVE
Protein, ur: NEGATIVE mg/dL
SPECIFIC GRAVITY, URINE: 1.023 (ref 1.005–1.030)
Urobilinogen, UA: 0.2 mg/dL (ref 0.0–1.0)
pH: 6 (ref 5.0–8.0)

## 2015-04-11 MED ORDER — ACETAMINOPHEN 500 MG PO TABS
1000.0000 mg | ORAL_TABLET | Freq: Once | ORAL | Status: AC
  Administered 2015-04-11: 1000 mg via ORAL
  Filled 2015-04-11: qty 2

## 2015-04-11 NOTE — ED Notes (Signed)
Pt c/o R sided lower back and flank pain onset yesterday then into RLQ today. Denies n/v. Reports loose stool x 3 days.

## 2015-04-11 NOTE — Discharge Instructions (Signed)
Abdominal Pain, Women °Abdominal (stomach, pelvic, or belly) pain can be caused by many things. It is important to tell your doctor: °· The location of the pain. °· Does it come and go or is it present all the time? °· Are there things that start the pain (eating certain foods, exercise)? °· Are there other symptoms associated with the pain (fever, nausea, vomiting, diarrhea)? °All of this is helpful to know when trying to find the cause of the pain. °CAUSES  °· Stomach: virus or bacteria infection, or ulcer. °· Intestine: appendicitis (inflamed appendix), regional ileitis (Crohn's disease), ulcerative colitis (inflamed colon), irritable bowel syndrome, diverticulitis (inflamed diverticulum of the colon), or cancer of the stomach or intestine. °· Gallbladder disease or stones in the gallbladder. °· Kidney disease, kidney stones, or infection. °· Pancreas infection or cancer. °· Fibromyalgia (pain disorder). °· Diseases of the female organs: °¨ Uterus: fibroid (non-cancerous) tumors or infection. °¨ Fallopian tubes: infection or tubal pregnancy. °¨ Ovary: cysts or tumors. °¨ Pelvic adhesions (scar tissue). °¨ Endometriosis (uterus lining tissue growing in the pelvis and on the pelvic organs). °¨ Pelvic congestion syndrome (female organs filling up with blood just before the menstrual period). °¨ Pain with the menstrual period. °¨ Pain with ovulation (producing an egg). °¨ Pain with an IUD (intrauterine device, birth control) in the uterus. °¨ Cancer of the female organs. °· Functional pain (pain not caused by a disease, may improve without treatment). °· Psychological pain. °· Depression. °DIAGNOSIS  °Your doctor will decide the seriousness of your pain by doing an examination. °· Blood tests. °· X-rays. °· Ultrasound. °· CT scan (computed tomography, special type of X-ray). °· MRI (magnetic resonance imaging). °· Cultures, for infection. °· Barium enema (dye inserted in the large intestine, to better view it with  X-rays). °· Colonoscopy (looking in intestine with a lighted tube). °· Laparoscopy (minor surgery, looking in abdomen with a lighted tube). °· Major abdominal exploratory surgery (looking in abdomen with a large incision). °TREATMENT  °The treatment will depend on the cause of the pain.  °· Many cases can be observed and treated at home. °· Over-the-counter medicines recommended by your caregiver. °· Prescription medicine. °· Antibiotics, for infection. °· Birth control pills, for painful periods or for ovulation pain. °· Hormone treatment, for endometriosis. °· Nerve blocking injections. °· Physical therapy. °· Antidepressants. °· Counseling with a psychologist or psychiatrist. °· Minor or major surgery. °HOME CARE INSTRUCTIONS  °· Do not take laxatives, unless directed by your caregiver. °· Take over-the-counter pain medicine only if ordered by your caregiver. Do not take aspirin because it can cause an upset stomach or bleeding. °· Try a clear liquid diet (broth or water) as ordered by your caregiver. Slowly move to a bland diet, as tolerated, if the pain is related to the stomach or intestine. °· Have a thermometer and take your temperature several times a day, and record it. °· Bed rest and sleep, if it helps the pain. °· Avoid sexual intercourse, if it causes pain. °· Avoid stressful situations. °· Keep your follow-up appointments and tests, as your caregiver orders. °· If the pain does not go away with medicine or surgery, you may try: °¨ Acupuncture. °¨ Relaxation exercises (yoga, meditation). °¨ Group therapy. °¨ Counseling. °SEEK MEDICAL CARE IF:  °· You notice certain foods cause stomach pain. °· Your home care treatment is not helping your pain. °· You need stronger pain medicine. °· You want your IUD removed. °· You feel faint or   lightheaded. °· You develop nausea and vomiting. °· You develop a rash. °· You are having side effects or an allergy to your medicine. °SEEK IMMEDIATE MEDICAL CARE IF:  °· Your  pain does not go away or gets worse. °· You have a fever. °· Your pain is felt only in portions of the abdomen. The right side could possibly be appendicitis. The left lower portion of the abdomen could be colitis or diverticulitis. °· You are passing blood in your stools (bright red or black tarry stools, with or without vomiting). °· You have blood in your urine. °· You develop chills, with or without a fever. °· You pass out. °MAKE SURE YOU:  °· Understand these instructions. °· Will watch your condition. °· Will get help right away if you are not doing well or get worse. °Document Released: 08/07/2007 Document Revised: 02/24/2014 Document Reviewed: 08/27/2009 °ExitCare® Patient Information ©2015 [employer]. This information is not intended to replace advice given to you by your health care provider. Make sure you discuss any questions you have with your health care provider. ° °

## 2015-04-11 NOTE — ED Provider Notes (Signed)
CSN: [id number]     Arrival date & time 04/11/15  1757 History   First MD Initiated Contact with Patient 04/11/15 1811     Chief Complaint  Patient presents with  . Abdominal Pain     (Consider location/radiation/quality/duration/timing/severity/associated sxs/prior Treatment) Patient is a 49 y.o. female presenting with abdominal pain. The history is provided by the patient.  Abdominal Pain Pain location:  R flank Pain quality: aching   Pain radiates to:  Does not radiate Pain severity:  No pain Onset quality:  Gradual Duration:  1 day Timing:  Constant Progression:  Worsening Chronicity:  New Context: not alcohol use and not sick contacts   Relieved by:  Nothing Worsened by:  Nothing tried Ineffective treatments:  None tried Associated symptoms: diarrhea (1 week)   Associated symptoms: no fever, no hematuria, no nausea and no vomiting   Risk factors: not pregnant     Past Medical History  Diagnosis Date  . Hypothyroidism   . GERD (gastroesophageal reflux disease)   . Anxiety   . Depression   . Seasonal allergies   . Arthritis    Past Surgical History  Procedure Laterality Date  . Carpal tunnel release      rt/lt  . Knee arthroscopy      both knees  . 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 Finger arthrodesis  11/08/2011    Procedure: ARTHRODESIS FINGER;  Surgeon: Wyn Forster., MD;  Location: [facility];  Service: Orthopedics;  Laterality: Left;  left thumb carpal-metacarpal fusion  . Tonsillectomy    . Partial knee arthroplasty  11/05/2012    Procedure: UNICOMPARTMENTAL KNEE;  Surgeon: [name], MD;  Location: [facility];  Service: Orthopedics;  Laterality: Right;   History reviewed. No pertinent family history. History  Substance Use Topics  . Smoking status: Never Smoker   . Smokeless tobacco: Never Used  . Alcohol Use: No     Comment: not in 4 yr   OB History    No data available      Review of Systems  Constitutional: Negative for fever.  Gastrointestinal: Positive for abdominal pain and diarrhea (1 week). Negative for nausea and vomiting.  Genitourinary: Negative for hematuria.  All other systems reviewed and are negative.     Allergies  Bee venom  Home Medications   Prior to Admission medications   Medication Sig Start Date End Date Taking? Authorizing Provider  acetaminophen (TYLENOL) 500 MG tablet Take 500-1,000 mg by mouth at bedtime. Dose varies based on pain level   Yes Historical Provider, MD  aspirin EC 81 MG tablet Take 81 mg by mouth daily.   Yes Historical Provider, MD  buPROPion (WELLBUTRIN XL) 150 MG 24 hr tablet Take 300 mg by mouth daily.  04/01/15  Yes Historical Provider, MD  calcium-vitamin D (OSCAL WITH D) 500-200 MG-UNIT per tablet Take 2 tablets by mouth daily.    Yes Historical Provider, MD  Cholecalciferol (VITAMIN D) 2000 UNITS tablet Take 2,000 Units by mouth daily.   Yes Historical Provider, MD  diphenhydrAMINE (BENADRYL) 25 mg capsule Take 1 capsule (25 mg total) by mouth every 6 (six) hours as needed for itching, allergies or sleep. Patient taking differently: Take 50 mg by mouth at bedtime.  11/06/12  Yes Lanney Gins, PA-C  EPINEPHrine (EPIPEN 2-PAK) 0.3 mg/0.3 mL IJ SOAJ injection Inject 0.3 mg into the muscle once as needed (allergic reaction).  04/23/13  Yes  Historical Provider, MD  FLUoxetine (PROZAC) 20 MG capsule Take 20 mg by mouth daily. 03/13/15  Yes Historical Provider, MD  levothyroxine (SYNTHROID, LEVOTHROID) 150 MCG tablet Take 150 mcg by mouth daily before breakfast.  03/13/15  Yes Historical Provider, MD  loratadine (CLARITIN) 10 MG tablet Take 10 mg by mouth daily.   Yes Historical Provider, MD  meloxicam (MOBIC) 15 MG tablet Take 15 mg by mouth daily. 04/10/15  Yes Historical Provider, MD  norethindrone (MICRONOR,CAMILA,ERRIN) 0.35 MG tablet Take 1 tablet by mouth at bedtime.  03/29/15  Yes Historical Provider, MD   omeprazole (PRILOSEC) 40 MG capsule Take 40 mg by mouth at bedtime.    Yes Historical Provider, MD  pravastatin (PRAVACHOL) 40 MG tablet Take 40 mg by mouth at bedtime.    Yes Historical Provider, MD  triamcinolone cream (KENALOG) 0.1 % Apply 1 application topically 2 (two) times daily as needed (rash).  04/23/12  Yes Historical Provider, MD  aspirin EC 325 MG tablet Take 1 tablet (325 mg total) by mouth 2 (two) times daily. X 4 weeks Patient not taking: Reported on 04/11/2015 11/06/12   Lanney Gins, PA-C  docusate sodium 100 MG CAPS Take 100 mg by mouth 2 (two) times daily. Patient not taking: Reported on 04/11/2015 11/06/12   Lanney Gins, PA-C  ferrous sulfate 325 (65 FE) MG tablet Take 1 tablet (325 mg total) by mouth 3 (three) times daily after meals. Patient not taking: Reported on 04/11/2015 11/06/12   Lanney Gins, PA-C  HYDROcodone-acetaminophen (NORCO) 7.5-325 MG per tablet Take 1-2 tablets by mouth every 4 (four) hours as needed for pain. Patient not taking: Reported on 04/11/2015 11/06/12   Lanney Gins, PA-C  methocarbamol (ROBAXIN) 500 MG tablet Take 1 tablet (500 mg total) by mouth every 6 (six) hours as needed (muscle spasms). Patient not taking: Reported on 04/11/2015 11/06/12   Lanney Gins, PA-C  polyethylene glycol (MIRALAX / Ethelene Hal) packet Take 17 g by mouth 2 (two) times daily. Patient not taking: Reported on 04/11/2015 11/06/12   Lanney Gins, PA-C   BP 123/69 mmHg  Pulse 72  Temp(Src) 98 F (36.7 C) (Oral)  Resp 16  SpO2 100% Physical Exam  Constitutional: She is oriented to person, place, and time. She appears well-developed and well-nourished. No distress.  HENT:  Head: Normocephalic.  Eyes: Conjunctivae are normal.  Neck: Neck supple. No tracheal deviation present.  Cardiovascular: Normal rate, regular rhythm and normal heart sounds.   Pulmonary/Chest: Effort normal and breath sounds normal. No respiratory distress.  Abdominal: Soft. Normal appearance. She  exhibits no distension. There is no tenderness. There is CVA tenderness (right). There is no rigidity, no guarding and negative Murphy's sign.  Neurological: She is alert and oriented to person, place, and time.  Skin: Skin is warm and dry.  Psychiatric: She has a normal mood and affect.    ED Course  Procedures (including critical care time) Labs Review Labs Reviewed  COMPREHENSIVE METABOLIC PANEL - Abnormal; Notable for the following:    Glucose, Bld 115 (*)    Total Bilirubin 0.2 (*)    All other components within normal limits  LIPASE, BLOOD - Abnormal; Notable for the following:    Lipase 16 (*)    All other components within normal limits  CBC WITH DIFFERENTIAL/PLATELET  URINALYSIS, ROUTINE W REFLEX MICROSCOPIC (NOT AT [facility])    Imaging Review No results found.   EKG Interpretation None      MDM   Final diagnoses:  Diarrhea  Right-sided abdominal pain of unknown cause    49 year old female presents with right flank pain after a weeklong bout of diarrhea. Screening lab work appears normal, her pain is well-controlled with Tylenol. She is afebrile, has no urinary complaints currently, has no hydro on bedside ultrasound, her aorta is normal dimensions on bedside ultrasound as well. She has no hematuria and her symptoms are very atypical for kidney stone given the preceding diarrheal illness. At this time I feel that no further workup emergently is warranted, the patient was provided return precautions and plan to follow up with her primary care physician this week for recheck of her abdomen.    Lyndal Pulley, MD 04/12/15 8119  Pricilla Loveless, MD 04/13/15 0010

## 2015-09-10 DIAGNOSIS — N1330 Unspecified hydronephrosis: Secondary | ICD-10-CM | POA: Insufficient documentation

## 2015-09-10 DIAGNOSIS — N261 Atrophy of kidney (terminal): Secondary | ICD-10-CM | POA: Insufficient documentation

## 2016-12-13 DIAGNOSIS — E039 Hypothyroidism, unspecified: Secondary | ICD-10-CM | POA: Insufficient documentation

## 2016-12-13 DIAGNOSIS — Z91030 Bee allergy status: Secondary | ICD-10-CM | POA: Insufficient documentation

## 2018-02-05 DIAGNOSIS — M1712 Unilateral primary osteoarthritis, left knee: Secondary | ICD-10-CM | POA: Insufficient documentation

## 2018-02-23 DIAGNOSIS — M1711 Unilateral primary osteoarthritis, right knee: Secondary | ICD-10-CM | POA: Insufficient documentation

## 2018-04-03 DIAGNOSIS — G8929 Other chronic pain: Secondary | ICD-10-CM | POA: Insufficient documentation

## 2018-04-03 DIAGNOSIS — M4310 Spondylolisthesis, site unspecified: Secondary | ICD-10-CM | POA: Insufficient documentation

## 2018-07-21 DIAGNOSIS — M542 Cervicalgia: Secondary | ICD-10-CM | POA: Insufficient documentation

## 2018-08-09 DIAGNOSIS — M5030 Other cervical disc degeneration, unspecified cervical region: Secondary | ICD-10-CM | POA: Insufficient documentation

## 2018-09-26 ENCOUNTER — Encounter: Payer: Self-pay | Admitting: Neurology

## 2018-09-26 ENCOUNTER — Ambulatory Visit: Payer: BLUE CROSS/BLUE SHIELD | Admitting: Neurology

## 2018-09-26 VITALS — BP 125/74 | HR 82 | Ht 63.75 in | Wt 259.0 lb

## 2018-09-26 DIAGNOSIS — R200 Anesthesia of skin: Secondary | ICD-10-CM | POA: Diagnosis not present

## 2018-09-26 NOTE — Progress Notes (Signed)
[facility]    Provider:  Dr Lucia Gaskins Referring Provider: Venita Lick MD Primary Care Physician:  Elizabeth Palau, FNP  CC:  Arms, hands and face go numb  HPI:  Melissa [name] is a 52 y.o. female here as requested by Dr. Shon Baton for neck pain. PMHx hypothyroid, IBS, one kidney. She is here alone. She saw Dr. Shon Baton due to lower back injury. She has numbness/tingly for about a year, getting worse recently. No inciting event. Onset was gradual she would notice it in her hands. She noticed it in her hands but she has a lot of hand issues and plates in her hands and CTS. She was very concerned when she started feeling it in her face bilaterally. It went away for a while but now coming back especially in the left arm/hand. Mri cervical spine did not show any etiology. No symptoms in the lower legs associated although she does have low back pain and sensory changes from that but not associated. Symptoms symmetric. She feels better after PT for weakness and she is taking water exercise classes. She has arthritis in thumbs and difficulty gripping but worsening in the setting of her numbness and tingling. Driving makes it worse.   Reviewed notes, labs and imaging from outside physicians, which showed:  Reviewed notes from [facility]. Patient c/o radicular back pain. Neg babinski and hoffman, neg romberg, symmetric DTRs, MRi showed siignificant nerve compression, selective nerve root block was performed.  Personally reviewed xrays of thoracic spine and c-spine, showed degenerative changes but no fractures pr structural abnormalities.    Review of Systems: Patient complains of symptoms per HPI as well as the following symptoms:numbness,soring,depression. Pertinent negatives and positives per HPI. All others negative.   Social History   Socioeconomic History  . Marital status: Single    Spouse name: Not on file  . Number of children: Not on file  . Years of education: Not  on file  . Highest education level: Not on file  Occupational History  . Not on file  Social Needs  . Financial resource strain: Not on file  . Food insecurity:    Worry: Not on file    Inability: Not on file  . Transportation needs:    Medical: Not on file    Non-medical: Not on file  Tobacco Use  . Smoking status: Never Smoker  . Smokeless tobacco: Never Used  Substance and Sexual Activity  . Alcohol use: No    Comment: not in 4 yr  . Drug use: Never  . Sexual activity: Not on file  Lifestyle  . Physical activity:    Days per week: Not on file    Minutes per session: Not on file  . Stress: Not on file  Relationships  . Social connections:    Talks on phone: Not on file    Gets together: Not on file    Attends religious service: Not on file    Active member of club or organization: Not on file    Attends meetings of clubs or organizations: Not on file    Relationship status: Not on file  . Intimate partner violence:    Fear of current or ex partner: Not on file    Emotionally abused: Not on file    Physically abused: Not on file    Forced sexual activity: Not on file  Other Topics Concern  . Not on file  Social History Narrative   Lives home alone, (has dogs and cats).  Disabled, due to back.  Education 2 college degree's.  Coffee/ sodas 2-3 daily    Family History  Problem Relation Age of Onset  . Arthritis Mother        spianl stenosis  . Atrial fibrillation Mother   . Transient ischemic attack Mother   . Diabetes Father   . Heart attack Father        3 stents  . Atrial fibrillation Sister     Past Medical History:  Diagnosis Date  . Anxiety   . Arthritis   . Depression   . GERD (gastroesophageal reflux disease)   . Hypothyroidism   . IBS (irritable bowel syndrome)   . Seasonal allergies     Past Surgical History:  Procedure Laterality Date  . CARPAL TUNNEL RELEASE     rt/lt  . FINGER ARTHRODESIS  11/08/2011   Procedure: ARTHRODESIS FINGER;   Surgeon: Wyn Forsterobert V Sypher Jr., MD;  Location: [facility];  Service: Orthopedics;  Laterality: Left;  left thumb carpal-metacarpal fusion  . KNEE ARTHROSCOPY     both knees  . PARTIAL KNEE ARTHROPLASTY  11/05/2012   Procedure: UNICOMPARTMENTAL KNEE;  Surgeon: Shelda PalMatthew D Olin, MD;  Location: [facility];  Service: Orthopedics;  Laterality: Right;  . TONSILLECTOMY    . 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 acetaminophen (TYLENOL) 500 MG tablet Take 500-1,000 mg by mouth at bedtime. Dose varies based on pain level    . aspirin EC 81 MG tablet Take 81 mg by mouth daily.    Marland Kitchen. buPROPion (WELLBUTRIN XL) 150 MG 24 hr tablet Take 300 mg by mouth daily.   0  . calcium-vitamin D (OSCAL WITH D) 500-200 MG-UNIT per tablet Take 2 tablets by mouth daily.     . cetirizine (ZYRTEC) 10 MG tablet Take 10 mg by mouth daily.    . Cholecalciferol (VITAMIN D) 2000 UNITS tablet Take 2,000 Units by mouth daily.    . diphenhydrAMINE (BENADRYL) 25 mg capsule Take 1 capsule (25 mg total) by mouth every 6 (six) hours as needed for itching, allergies or sleep. (Patient taking differently: Take 50 mg by mouth at bedtime. ) 30 capsule   . EPINEPHrine (EPIPEN 2-PAK) 0.3 mg/0.3 mL IJ SOAJ injection Inject 0.3 mg into the muscle once as needed (allergic reaction).     Marland Kitchen. FLUoxetine (PROZAC) 20 MG capsule Take 20 mg by mouth daily.  3  . levonorgestrel (MIRENA) 20 MCG/24HR IUD 1 each by Intrauterine route once.    Marland Kitchen. levothyroxine (SYNTHROID, LEVOTHROID) 175 MCG tablet Take 175 mcg by mouth daily before breakfast.    . meloxicam (MOBIC) 15 MG tablet Take 15 mg by mouth daily.  0  . montelukast (SINGULAIR) 10 MG tablet Take 10 mg by mouth at bedtime.    Marland Kitchen. omeprazole (PRILOSEC) 40 MG capsule Take 40 mg by mouth at bedtime.     . pravastatin (PRAVACHOL) 40 MG tablet Take 40 mg by mouth at bedtime.     Marland Kitchen.  tiZANidine (ZANAFLEX) 4 MG tablet Take 4 mg by mouth at bedtime.    . triamcinolone cream (KENALOG) 0.1 % Apply 1 application topically 2 (two) times daily as needed (rash).      No current facility-administered medications for this visit.     Allergies as of 09/26/2018 - Review Complete 09/26/2018  Allergen Reaction Noted  . Bee venom Hives 04/11/2015  Vitals: BP 125/74   Pulse 82   Ht 5' 3.75" (1.619 m)   Wt 259 lb (117.5 kg)   BMI 44.81 kg/m  Last Weight:  Wt Readings from Last 1 Encounters:  09/26/18 259 lb (117.5 kg)   Last Height:   Ht Readings from Last 1 Encounters:  09/26/18 5' 3.75" (1.619 m)    Physical exam: Exam: Gen: NAD, conversant, well nourised, obese, well groomed                     CV: RRR, no MRG. No Carotid Bruits. No peripheral edema, warm, nontender Eyes: Conjunctivae clear without exudates or hemorrhage  Neuro: Detailed Neurologic Exam  Speech:    Speech is normal; fluent and spontaneous with normal comprehension.  Cognition:    The patient is oriented to person, place, and time;     recent and remote memory intact;     language fluent;     normal attention, concentration,     fund of knowledge Cranial Nerves:    The pupils are equal, round, and reactive to light. The fundi are normal and spontaneous venous pulsations are present. Visual fields are full to finger confrontation. Extraocular movements are intact. Trigeminal sensation is intact and the muscles of mastication are normal. Left ptosis )chronic) The palate elevates in the midline. Hearing intact. Voice is normal. Shoulder shrug is normal. The tongue has normal motion without fasciculations.   Coordination:    Normal finger to nose and heel to shin.  Gait:    Heel-toe and tandem gait are normal.   Motor Observation:    No asymmetry, no atrophy, and no involuntary movements noted. Tone:    Normal muscle tone.    Posture:    Posture is normal. normal erect    Strength:  left hip flexion giveway otherwise strength is V/V in the upper and lower limbs.      Sensation: intact to LT, dec pin prick in dig 5 bilat     Reflex Exam:  DTR's:    Deep tendon reflexes in the upper and lower extremities are brisk lowers bilaterally, 1+ uppers.   Toes:    The toes are equiv bilaterally.   Clonus:    2 beats left AJ      Assessment/Plan:  52 year old with cervical disk degenerative disease but not explaining her bilateral symmetric upper extremity and facial numbness. +Phalen's maneuver on the left.   Emg/ncs May consider MRI brain however less likely, objective sensation normal in the face on exam, unlikely for a brain etiology to localize to bilateral lower face numbness.   Orders Placed This Encounter  Procedures  . NCV with EMG(electromyography)   Cc:Dr. [name] Quint, MD  [facility] [address] [place], [place] [address]  Phone [phone] Fax [phone]

## 2018-09-26 NOTE — Patient Instructions (Signed)
Emg/ncs  Electromyoneurogram Electromyoneurogram is a test to check how well your muscles and nerves are working. This procedure includes the combined use of electromyogram (EMG) and nerve conduction study (NCS). EMG is used to look for muscular disorders. NCS, which is also called electroneurogram, measures how well your nerves are controlling your muscles. The procedures are usually performed together to check if your muscles and nerves are healthy. If the reaction to testing is abnormal, this can indicate disease or injury, such as peripheral nerve damage. Tell a health care provider about:  Any allergies you have.  All medicines you are taking, including vitamins, herbs, eye drops, creams, and over-the-counter medicines.  Any problems you or family members have had with anesthetic medicines.  Any blood disorders you have.  Any surgeries you have had.  Any medical conditions you have.  Any pacemaker you have. What are the risks? Generally, this is a safe procedure. However, problems may occur, including:  Infection where the electrodes were inserted.  Bleeding.  What happens before the procedure?  Ask your health care provider about: ? Changing or stopping your regular medicines. This is especially important if you are taking diabetes medicines or blood thinners. ? Taking medicines such as aspirin and ibuprofen. These medicines can thin your blood. Do not take these medicines before your procedure if your health care provider instructs you not to.  Your health care provider may ask you to avoid: ? Caffeine, such as coffee and tea. ? Nicotine. This includes cigarettes and anything with tobacco.  Do not use lotions or creams on the same day that you will be having the procedure. What happens during the procedure? For EMG:  Your health care provider will ask you to stay in a position so that he or she can access the muscle that will be studied. You may be standing, sitting  down, or lying down.  You may be given a medicine that numbs the area (local anesthetic).  A very thin needle that has an electrode on it will be inserted into your muscle.  Another small electrode will be placed on your skin near the muscle.  Your health care provider will ask you to continue to remain still.  The electrodes will send a signal that tells about the electrical activity of your muscles. You may see this on a monitor or hear it in the room.  After your muscles have been studied at rest, your health care provider will ask you to contract or flex your muscles. The electrodes will send a signal that tells about the electrical activity of your muscles.  Your health care provider will remove the electrodes and the electrode needles when the procedure is finished. The procedure may vary among health care providers and hospitals. For NCS:  An electrode that records your nerve activity (recording electrode) will be placed on your skin by the muscle that is being studied.  An electrode that is used as a reference (reference electrode) will be placed near the recording electrode.  A paste or gel will be applied to your skin between the recording electrode and the reference electrode.  Your nerve will be stimulated with a mild shock. Your health care provider will measure how much time it takes for your muscle to react.  Your health care provider will remove the electrodes and the gel when the procedure is finished. The procedure may vary among health care providers and hospitals. What happens after the procedure?  It is your responsibility to obtain  your test results. Ask your health care provider or the department performing the test when and how you will get your results.  Your health care provider may: ? Give you medicines for any pain. ? Monitor the insertion sites to make sure that they stop bleeding. This information is not intended to replace advice given to you by your  health care provider. Make sure you discuss any questions you have with your health care provider. Document Released: 02/10/2005 Document Revised: 03/17/2016 Document Reviewed: 12/01/2014 Elsevier Interactive Patient Education  2018 [employer].

## 2018-12-13 ENCOUNTER — Ambulatory Visit (INDEPENDENT_AMBULATORY_CARE_PROVIDER_SITE_OTHER): Payer: BLUE CROSS/BLUE SHIELD | Admitting: Neurology

## 2018-12-13 ENCOUNTER — Ambulatory Visit: Payer: BLUE CROSS/BLUE SHIELD | Admitting: Neurology

## 2018-12-13 DIAGNOSIS — G5602 Carpal tunnel syndrome, left upper limb: Secondary | ICD-10-CM | POA: Diagnosis not present

## 2018-12-13 DIAGNOSIS — Z0289 Encounter for other administrative examinations: Secondary | ICD-10-CM

## 2018-12-13 DIAGNOSIS — R200 Anesthesia of skin: Secondary | ICD-10-CM | POA: Diagnosis not present

## 2018-12-13 DIAGNOSIS — G5600 Carpal tunnel syndrome, unspecified upper limb: Secondary | ICD-10-CM

## 2018-12-13 NOTE — Progress Notes (Signed)
This is a patient with sensory symptoms in her hands. EMG/NCS was performed today. Discussed with patient, appears she may have mild Carpal Tunnel Syndrome.  Discussed etiologies and pathophysiology of carpal tunnel syndrome with patient.  Discussed possible treatments including conservative measures such as wrist splinting, injections or surgical procedures such as carpal tunnel release.  Discussed the pros and cons of each.  Also discussed risk factors for carpal tunnel syndrome.  After discussion and review of results patient is decided to try conservative measures for several months and then will let us know how she would like to proceed.  A total of 15 minutes was spent face-to-face with this patient. Over half this time was spent on counseling patient on the  1. Bilateral hand numbness    diagnosis and different diagnostic and therapeutic options, counseling and coordination of care, risks ans benefits of management, compliance, or risk factor reduction and education.  This does not include time spent on emg/ncs procedure.           Full Name: Melissa [name] Gender: Female MRN #: [id number] Date of Birth: 12-Sep-1966    Visit Date: 12/13/2018 09:32 Age: 53 Years 0 Months Old Examining Physician: Naomie Dean, MD  Referring Physician: Venita Lick, MD  History: Hand pain and paresthesias  Summary: EMG/NCS was performed on the bilateral upper extremities.   The left median/ulnar (palm) comparison nerve showed  abnormal peak latency difference (Median Palm-Ulnar Palm, 0.4 ms, N<0.4) with a relative median delay.   All remaining nerves (as indicated in the following tables) were within normal limits.  All muscles (as indicated in the following tables) were within normal limits.    Conclusion: Isolated abnormal peak latency difference on left Median/Ulnar Palmar Comparison nerve study does not fulfill electrodiagnostic criteria for CTS but given clinical history may signify early/mild left  median neuropathy at the wrist. No suggestion of cervical radiculopathy or polyneuropathy.    Naomie Dean, MD  [facility] [address] [place], [place] [address]  Phone [phone] Fax [phone]  CC:Dr. Shon Baton     [facility]    Nerve / Sites Muscle Latency Ref. Amplitude Ref. Rel Amp Segments Distance Velocity Ref. Area    ms ms mV mV %  cm m/s m/s mVms  R Median - APB     Wrist APB 3.5 ?4.4 6.1 ?4.0 100 Wrist - APB 7   17.2     Upper arm APB 7.2  5.9  96.7 Upper arm - Wrist 21 57 ?49 16.9  L Median - APB     Wrist APB 3.3 ?4.4 8.1 ?4.0 100 Wrist - APB 7   19.7     Upper arm APB 6.9  7.6  93.7 Upper arm - Wrist 19 53 ?49 18.7  R Ulnar - ADM     Wrist ADM 2.0 ?3.3 9.4 ?6.0 100 Wrist - ADM 7   27.8     B.Elbow ADM 4.9  9.1  96.7 B.Elbow - Wrist 17 58 ?49 28.1     A.Elbow ADM 6.7  8.8  96.4 A.Elbow - B.Elbow 10 58 ?49 27.6         A.Elbow - Wrist      L Ulnar - ADM     Wrist ADM 2.2 ?3.3 8.8 ?6.0 100 Wrist - ADM 7   23.0     B.Elbow ADM 4.9  8.1  92.2 B.Elbow - Wrist 17 63 ?49 22.1     A.Elbow ADM 6.6  7.8  96.5 A.Elbow - B.Elbow 10 60 ?49 21.4         A.Elbow - Wrist                 SNC    Nerve / Sites Rec. Site Peak Lat Ref.  Amp Ref. Segments Distance Peak Diff Ref.    ms ms V V  cm ms ms  R Median, Ulnar - Transcarpal comparison     Median Palm Wrist 2.2 ?2.2 39 ?35 Median Palm - Wrist 8       Ulnar Palm Wrist 2.0 ?2.2 27 ?12 Ulnar Palm - Wrist 8          Median Palm - Ulnar Palm  0.3 ?0.4  L Median, Ulnar - Transcarpal comparison     Median Palm Wrist 2.2 ?2.2 49 ?35 Median Palm - Wrist 8       Ulnar Palm Wrist 1.9 ?2.2 16 ?12 Ulnar Palm - Wrist 8          Median Palm - Ulnar Palm  0.4 ?0.4  R Median - Orthodromic (Dig II, Mid palm)     Dig II Wrist 3.3 ?3.4 10 ?10 Dig II - Wrist 13    L Median - Orthodromic (Dig II, Mid palm)     Dig II Wrist 3.1 ?3.4 14 ?10 Dig II - Wrist 13    R Ulnar - Orthodromic, (Dig V, Mid palm)     Dig  V Wrist 2.6 ?3.1 9 ?5 Dig V - Wrist 11    L Ulnar - Orthodromic, (Dig V, Mid palm)     Dig V Wrist 2.6 ?3.1 12 ?5 Dig V - Wrist 23                   F  Wave    Nerve F Lat Ref.   ms ms  R Ulnar - ADM 24.8 ?32.0  L Ulnar - ADM 25.1 ?32.0         EMG full       EMG Summary Table    Spontaneous MUAP Recruitment  Muscle IA Fib PSW Fasc Other Amp Dur. Poly Pattern  L. Cervical paraspinals (low) Normal None None None _______ Normal Normal Normal Normal  L. Deltoid Normal None None None _______ Normal Normal Normal Normal  L. Triceps brachii Normal None None None _______ Normal Normal Normal Normal  L. Pronator teres Normal None None None _______ Normal Normal Normal Normal  L. First dorsal interosseous Normal None None None _______ Normal Normal Normal Normal  L. Opponens pollicis Normal None None None _______ Normal Normal Normal Normal

## 2018-12-16 NOTE — Progress Notes (Signed)
See procedure note.

## 2018-12-18 NOTE — Procedures (Signed)
Full Name: Melissa [name] Gender: Female MRN #: [id number] Date of Birth: 1966/04/18    Visit Date: 12/13/2018 09:32 Age: 53 Years 0 Months Old Examining Physician: Naomie Dean, MD  Referring Physician: Venita Lick, MD  History: Hand pain and paresthesias  Summary: EMG/NCS was performed on the bilateral upper extremities.   The left median/ulnar (palm) comparison nerve showed  abnormal peak latency difference (Median Palm-Ulnar Palm, 0.4 ms, N<0.4) with a relative median delay.   All remaining nerves (as indicated in the following tables) were within normal limits.  All muscles (as indicated in the following tables) were within normal limits.    Conclusion: Isolated abnormal peak latency difference on left Median/Ulnar Palmar Comparison nerve study does not fulfill electrodiagnostic criteria for CTS but given clinical history may signify early/mild left median neuropathy at the wrist. No suggestion of cervical radiculopathy or polyneuropathy.    Naomie Dean, MD  [facility] [address] [place], [place] [address]  Phone [phone] Fax [phone]  CC:Dr. Shon Baton     [facility]    Nerve / Sites Muscle Latency Ref. Amplitude Ref. Rel Amp Segments Distance Velocity Ref. Area    ms ms mV mV %  cm m/s m/s mVms  R Median - APB     Wrist APB 3.5 ?4.4 6.1 ?4.0 100 Wrist - APB 7   17.2     Upper arm APB 7.2  5.9  96.7 Upper arm - Wrist 21 57 ?49 16.9  L Median - APB     Wrist APB 3.3 ?4.4 8.1 ?4.0 100 Wrist - APB 7   19.7     Upper arm APB 6.9  7.6  93.7 Upper arm - Wrist 19 53 ?49 18.7  R Ulnar - ADM     Wrist ADM 2.0 ?3.3 9.4 ?6.0 100 Wrist - ADM 7   27.8     B.Elbow ADM 4.9  9.1  96.7 B.Elbow - Wrist 17 58 ?49 28.1     A.Elbow ADM 6.7  8.8  96.4 A.Elbow - B.Elbow 10 58 ?49 27.6         A.Elbow - Wrist      L Ulnar - ADM     Wrist ADM 2.2 ?3.3 8.8 ?6.0 100 Wrist - ADM 7   23.0     B.Elbow ADM 4.9  8.1  92.2 B.Elbow - Wrist 17 63  ?49 22.1     A.Elbow ADM 6.6  7.8  96.5 A.Elbow - B.Elbow 10 60 ?49 21.4         A.Elbow - Wrist                 SNC    Nerve / Sites Rec. Site Peak Lat Ref.  Amp Ref. Segments Distance Peak Diff Ref.    ms ms V V  cm ms ms  R Median, Ulnar - Transcarpal comparison     Median Palm Wrist 2.2 ?2.2 39 ?35 Median Palm - Wrist 8       Ulnar Palm Wrist 2.0 ?2.2 27 ?12 Ulnar Palm - Wrist 8          Median Palm - Ulnar Palm  0.3 ?0.4  L Median, Ulnar - Transcarpal comparison     Median Palm Wrist 2.2 ?2.2 49 ?35 Median Palm - Wrist 8       Ulnar Palm Wrist 1.9 ?2.2 16 ?12 Ulnar Palm - Wrist 8  Median Palm - Ulnar Palm  0.4 ?0.4  R Median - Orthodromic (Dig II, Mid palm)     Dig II Wrist 3.3 ?3.4 10 ?10 Dig II - Wrist 13    L Median - Orthodromic (Dig II, Mid palm)     Dig II Wrist 3.1 ?3.4 14 ?10 Dig II - Wrist 13    R Ulnar - Orthodromic, (Dig V, Mid palm)     Dig V Wrist 2.6 ?3.1 9 ?5 Dig V - Wrist 11    L Ulnar - Orthodromic, (Dig V, Mid palm)     Dig V Wrist 2.6 ?3.1 12 ?5 Dig V - Wrist 55                   F  Wave    Nerve F Lat Ref.   ms ms  R Ulnar - ADM 24.8 ?32.0  L Ulnar - ADM 25.1 ?32.0         EMG full       EMG Summary Table    Spontaneous MUAP Recruitment  Muscle IA Fib PSW Fasc Other Amp Dur. Poly Pattern  L. Cervical paraspinals (low) Normal None None None _______ Normal Normal Normal Normal  L. Deltoid Normal None None None _______ Normal Normal Normal Normal  L. Triceps brachii Normal None None None _______ Normal Normal Normal Normal  L. Pronator teres Normal None None None _______ Normal Normal Normal Normal  L. First dorsal interosseous Normal None None None _______ Normal Normal Normal Normal  L. Opponens pollicis Normal None None None _______ Normal Normal Normal Normal

## 2019-01-14 ENCOUNTER — Telehealth: Payer: Self-pay | Admitting: Neurology

## 2019-01-14 NOTE — Telephone Encounter (Signed)
[facility] [phone] case # [id number] inquiring if release for medical records was rec'd. It was faxed on Friday 3/20. Please call to advise

## 2019-01-14 NOTE — Telephone Encounter (Signed)
[employer] form rec'd on 01/14/19

## 2019-03-14 DIAGNOSIS — Z0271 Encounter for disability determination: Secondary | ICD-10-CM

## 2019-11-14 ENCOUNTER — Encounter (INDEPENDENT_AMBULATORY_CARE_PROVIDER_SITE_OTHER): Payer: Self-pay | Admitting: Bariatrics

## 2019-11-18 ENCOUNTER — Encounter (INDEPENDENT_AMBULATORY_CARE_PROVIDER_SITE_OTHER): Payer: Self-pay | Admitting: Bariatrics

## 2019-11-18 ENCOUNTER — Ambulatory Visit (INDEPENDENT_AMBULATORY_CARE_PROVIDER_SITE_OTHER): Payer: 59 | Admitting: Bariatrics

## 2019-11-18 ENCOUNTER — Other Ambulatory Visit: Payer: Self-pay

## 2019-11-18 VITALS — BP 122/70 | HR 73 | Temp 98.1°F | Ht 62.0 in | Wt 256.0 lb

## 2019-11-18 DIAGNOSIS — R5383 Other fatigue: Secondary | ICD-10-CM

## 2019-11-18 DIAGNOSIS — R0602 Shortness of breath: Secondary | ICD-10-CM | POA: Diagnosis not present

## 2019-11-18 DIAGNOSIS — F3289 Other specified depressive episodes: Secondary | ICD-10-CM

## 2019-11-18 DIAGNOSIS — M545 Low back pain, unspecified: Secondary | ICD-10-CM

## 2019-11-18 DIAGNOSIS — G4733 Obstructive sleep apnea (adult) (pediatric): Secondary | ICD-10-CM | POA: Diagnosis not present

## 2019-11-18 DIAGNOSIS — G8929 Other chronic pain: Secondary | ICD-10-CM

## 2019-11-18 DIAGNOSIS — Z9189 Other specified personal risk factors, not elsewhere classified: Secondary | ICD-10-CM | POA: Diagnosis not present

## 2019-11-18 DIAGNOSIS — Z6841 Body Mass Index (BMI) 40.0 and over, adult: Secondary | ICD-10-CM

## 2019-11-18 DIAGNOSIS — Z0289 Encounter for other administrative examinations: Secondary | ICD-10-CM

## 2019-11-18 DIAGNOSIS — E7849 Other hyperlipidemia: Secondary | ICD-10-CM

## 2019-11-18 DIAGNOSIS — E559 Vitamin D deficiency, unspecified: Secondary | ICD-10-CM

## 2019-11-18 DIAGNOSIS — E039 Hypothyroidism, unspecified: Secondary | ICD-10-CM

## 2019-11-18 DIAGNOSIS — K5900 Constipation, unspecified: Secondary | ICD-10-CM | POA: Insufficient documentation

## 2019-11-18 DIAGNOSIS — R7309 Other abnormal glucose: Secondary | ICD-10-CM

## 2019-11-18 NOTE — Progress Notes (Signed)
Chief Complaint:   OBESITY Melissa [name] (MR# [id number]) is a 54 y.o. female who presents for evaluation and treatment of obesity and related comorbidities. Current BMI is Body mass index is 46.82 kg/m.Melissa [name] has been struggling with her weight for many years and has been unsuccessful in either losing weight, maintaining weight loss, or reaching her healthy weight goal.  Melissa [name] is currently in the action stage of change and ready to dedicate time achieving and maintaining a healthier weight. Melissa [name] is interested in becoming our patient and working on intensive lifestyle modifications including (but not limited to) diet and exercise for weight loss.   Melissa [name] is lactose intolerant. She eats outside the home 4-5 times a week. She does not like to cook. She does crave sweets. She identifies her worst habit as not eating regularly.  Melissa [name]'s habits were reviewed today and are as follows: Her family eats meals together, her desired weight loss is 112 lbs, she has been heavy most of her life, she started gaining weight 10 years ago, her heaviest weight ever was 267 pounds, she craves sweets, she skips lunch 3-4 times a week, she frequently makes poor food choices, she sometimes eats larger portions than normal and she struggles with emotional eating.  Depression Screen Melissa [name]'s Food and Mood (modified PHQ-9) score was 15.  Depression screen Melissa [name] 2/9 11/18/2019  Decreased Interest 3  Down, Depressed, Hopeless 2  PHQ - 2 Score 5  Altered sleeping 3  Tired, decreased energy 3  Change in appetite 3  Feeling bad or failure about yourself  0  Trouble concentrating 0  Moving slowly or fidgety/restless 0  Suicidal thoughts 1  PHQ-9 Score 15  Difficult doing work/chores Very difficult   Subjective:   Other fatigue. Melissa [name] denies daytime somnolence and admits to waking up still tired. Melissa [name] generally gets 6-8 hours of sleep per night, and states that she has does not sleep well most nights.  Snoring is present. Apneic episodes are not present. Epworth Sleepiness Score is 2.  Shortness of breath on exertion. Melissa [name] notes increasing shortness of breath with certain activities and seems to be worsening over time with weight gain. She notes getting out of breath sooner with activity than she used to. This has gotten worse recently. Melissa [name] denies shortness of breath at rest or orthopnea.  OSA (obstructive sleep apnea). Melissa [name] has a diagnosis of sleep apnea, which is stable. She reports that she is using a CPAP regularly, which helps with sleep.   Vitamin D deficiency. Melissa [name] is taking OTC Vitamin D 2,000 units.  Acquired hypothyroidism. This is fairly well controlled. She is taking levothyroxine. Last level was reported to have been in October 2016.  No results found for: TSH  Chronic low back pain, unspecified back pain laterality, unspecified whether sciatica present. Melissa [name] is taking Tylenol.  Other hyperlipidemia. Medication(s) reviewed. Patient denies myalgias. Melissa [name] is taking Pravastatin. LDL was reported to be 103, total cholesterol 174 with an HDL of 55.  No results found for: CHOL, HDL, LDLCALC, LDLDIRECT, TRIG, CHOLHDL Lab Results  Component Value Date   ALT 17 04/11/2015   AST 19 04/11/2015   ALKPHOS 81 04/11/2015   BILITOT 0.2 (L) 04/11/2015   The ASCVD Risk score Melissa George DC Jr., et al., 2013) failed to calculate for the following reasons:   Cannot find a previous HDL lab  Elevated glucose. Melissa [name] reports having a family history of elevated glucose. A1c reported to be 5.5. She had been in the  prediabetes range above 6.0.  Other depression, with emotional eating. Melissa [name] is struggling with emotional eating and using food for comfort to the extent that it is negatively impacting her health. She has been working on behavior modification techniques to help reduce her emotional eating and has been somewhat successful. She shows no sign of suicidal or homicidal ideations.  Melissa [name] reports emotional eating. She is taking medication for depression, which is controlled per the patient.  Depression Screen. [name] had a strongly positive depression screen today with a PHQ-9 score of 15.  At risk for osteoporosis. [name] is at higher risk of osteopenia and osteoporosis due to Vitamin D deficiency, age, and dietary changes.   Assessment/Plan:   Other fatigue. [name] does feel that her weight is causing her energy to be lower than it should be. Fatigue may be related to obesity, depression or many other causes. Labs will be ordered, and in the meanwhile, Melissa [name] will focus on self care including making healthy food choices, increasing physical activity and focusing on stress reduction. EKG 12-Lead, Vitamin B12  Shortness of breath on exertion. [name] does feel that she gets out of breath more easily that she used to when she exercises. Melissa [name]'s shortness of breath appears to be obesity related and exercise induced. She has agreed to work on weight loss and gradually increase exercise to treat her exercise induced shortness of breath. Will continue to monitor closely.  OSA (obstructive sleep apnea). Intensive lifestyle modifications are the first line treatment for this issue. We discussed several lifestyle modifications today and she will continue to work on diet, exercise and weight loss efforts. We will continue to monitor. Orders and follow up as documented in patient record. She will continue to use her CPAP machine.  Counseling  Sleep apnea is a condition in which breathing pauses or becomes shallow during sleep. This happens over and over during the night. This disrupts your sleep and keeps your body from getting the rest that it needs, which can cause tiredness and lack of energy (fatigue) during the day.  Sleep apnea treatment: If you were given a device to open your airway while you sleep, USE IT!  Sleep hygiene:   Limit or avoid alcohol, caffeinated beverages,  and cigarettes, especially close to bedtime.   Do not eat a large meal or eat spicy foods right before bedtime. This can lead to digestive discomfort that can make it hard for you to sleep.  Keep a sleep diary to help you and your health care provider figure out what could be causing your insomnia.  . Make your bedroom a dark, comfortable place where it is easy to fall asleep. ? Put up shades or blackout curtains to block light from outside. ? Use a white noise machine to block noise. ? Keep the temperature cool. . Limit screen use before bedtime. This includes: ? Watching TV. ? Using your smartphone, tablet, or computer. . Stick to a routine that includes going to bed and waking up at the same times every day and night. This can help you fall asleep faster. Consider making a quiet activity, such as reading, part of your nighttime routine. . Try to avoid taking naps during the day so that you sleep better at night. . Get out of bed if you are still awake after 15 minutes of trying to sleep. Keep the lights down, but try reading or doing a quiet activity. When you feel sleepy, go back to bed.  Vitamin D deficiency. Low Vitamin  D level contributes to fatigue and are associated with obesity, breast, and colon cancer. She agrees to continue to take OTCVitamin D and will follow-up for routine testing of Vitamin D, at least 2-3 times per year to avoid over-replacement. Vitamin B12, VITAMIN D 25 Hydroxy (Vit-D Deficiency, Fractures) level ordered.  Acquired hypothyroidism. Patient with long-standing hypothyroidism, on levothyroxine therapy. She appears euthyroid. Orders and follow up as documented in patient record. Melissa [name] will continue levothyroxine.  Counseling . Good thyroid control is important for overall health. Supratherapeutic thyroid levels are dangerous and will not improve weight loss results. The correct way to take levothyroxine is fasting, with water, separated by at least 30 minutes  from breakfast, and separated by more than 4 hours from calcium, iron, multivitamins, acid reflux medications (PPIs).   Chronic low back pain, unspecified back pain laterality, unspecified whether sciatica present. Melissa [name] was advised to not do any pounding exercises and will gradually increase exercise.  Other hyperlipidemia. Cardiovascular risk and specific lipid/LDL goals reviewed.  We discussed several lifestyle modifications today and Melissa [name] will continue to work on diet, exercise and weight loss efforts. Orders and follow up as documented in patient record. She will continue taking Pravastatin.  Counseling Intensive lifestyle modifications are the first line treatment for this issue. . Dietary changes: Increase soluble fiber. Decrease simple carbohydrates. . Exercise changes: Moderate to vigorous-intensity aerobic activity 150 minutes per week if tolerated. Lipid-lowering medications: see documented in medical record.  Elevated glucose. Insulin, random level ordered.  Other depression, with emotional eating. Behavior modification techniques were discussed today to help Melissa [name] deal with her emotional/non-hunger eating behaviors.  Orders and follow up as documented in patient record. Will schedule an appointment with Dr. Mallie Mussel, our bariatric psychologist. She will continue her medications as prescribed.  Depression Screen. Melissa [name] had a strongly positive depression screening with a score of 15. Depression is commonly associated with obesity and often results in emotional eating behaviors. We will monitor this closely and work on CBT to help improve the non-hunger eating patterns. Referral to Psychology may be required if no improvement is seen as she continues in our clinic.  At risk for osteoporosis. Melissa [name] was given approximately 15 minutes of osteoporosis prevention counseling today. Melissa [name] is at risk for osteopenia and osteoporosis due to her Vitamin D deficiency. She was encouraged to take  her Vitamin D and follow her higher calcium diet and increase strengthening exercise to help strengthen her bones and decrease her risk of osteopenia and osteoporosis.  Repetitive spaced learning was employed today to elicit superior memory formation and behavioral change.  Class 3 severe obesity with serious comorbidity and body mass index (BMI) of 45.0 to 49.9 in adult, unspecified obesity type (Melissa [name]).  Melissa [name] is currently in the action stage of change and her goal is to continue with weight loss efforts. I recommend Melissa [name] begin the structured treatment plan as follows:  She has agreed to the Category 2 Plan.  She will work on meal planning and intentional eating.   We reviewed labs including CMP, CBC, TSH, and lipids.  Exercise goals: For substantial health benefits, adults should do at least 150 minutes (2 hours and 30 minutes) a week of moderate-intensity, or 75 minutes (1 hour and 15 minutes) a week of vigorous-intensity aerobic physical activity, or an equivalent combination of moderate- and vigorous-intensity aerobic activity. Aerobic activity should be performed in episodes of at least 10 minutes, and preferably, it should be spread throughout the week. Adults should also include muscle-strengthening activities  that involve all major muscle groups on 2 or more days a week.   Behavioral modification strategies: increasing lean protein intake, decreasing simple carbohydrates, increasing vegetables, increasing water intake, decreasing eating out, no skipping meals, meal planning and cooking strategies, keeping healthy foods in the home and planning for success.  She was informed of the importance of frequent follow-up visits to maximize her success with intensive lifestyle modifications for her multiple health conditions. She was informed we would discuss her lab results at her next visit unless there is a critical issue that needs to be addressed sooner. Tenya agreed to keep her next visit  at the agreed upon time to discuss these results.  Objective:   Blood pressure 122/70, pulse 73, temperature 98.1 F (36.7 C), height 5\' 2"  (1.575 m), weight 256 lb (116.1 kg), SpO2 97 %. Body mass index is 46.82 kg/m.  EKG: Sinus  Rhythm with a rate of 77. Low voltage in precordial leads most likely r/t body habitus. Otherwise normal.  Indirect Calorimeter completed today shows a VO2 of 221 and a REE of 1539.  Her calculated basal metabolic rate is thus her basal metabolic rate is worse than expected.  General: Cooperative, alert, well developed, in no acute distress. HEENT: Conjunctivae and lids unremarkable. Cardiovascular: Regular rhythm.  Lungs: Normal work of breathing. Neurologic: No focal deficits. Uses a 3-point cane for ambulation.  Lab Results  Component Value Date   CREATININE 0.82 04/11/2015   BUN 10 04/11/2015   NA 139 04/11/2015   K 4.0 04/11/2015   CL 107 04/11/2015   CO2 24 04/11/2015   Lab Results  Component Value Date   ALT 17 04/11/2015   AST 19 04/11/2015   ALKPHOS 81 04/11/2015   BILITOT 0.2 (L) 04/11/2015   No results found for: HGBA1C No results found for: INSULIN No results found for: TSH No results found for: CHOL, HDL, LDLCALC, LDLDIRECT, TRIG, CHOLHDL Lab Results  Component Value Date   WBC 8.7 04/11/2015   HGB 12.3 04/11/2015   HCT 38.1 04/11/2015   MCV 86.8 04/11/2015   PLT 371 04/11/2015     Attestation Statements:   Reviewed by clinician on day of visit: allergies, medications, problem list, medical history, surgical history, family history, social history, and previous encounter notes.  04/13/2015, am acting as Fernanda [employer] for [employer] manager, DO   I have reviewed the above documentation for accuracy and completeness, and I agree with the above. [employer], DO

## 2019-11-18 NOTE — Progress Notes (Signed)
Office: [phone]  /  Fax: [phone]    Date: November 20, 2019  Time Seen: 11:56am Duration: 67 minutes Provider: Lawerance CruelGaytri Navon Kotowski, PsyD Type of Session: Intake for Individual Therapy  Type of Contact: Face-to-face  Informed Consent for In-Person Services During COVID-19: During today's appointment, information about the decision to initiate in-person services in light of the COVID-19 public health crisis was discussed. Morrie SheldonAshley and this provider agreed to meet in person for some or all future appointments. If there is a resurgence of the pandemic or other health concerns arise, telepsychological services may be initiated and any related concerns will be discussed and an attempt to address them will be made. Morrie Sheldonshley verbally acknowledged understanding that if necessary, this provider may determine there is a need to initiate telepsychological services for everyone's well-being. Morrie Sheldonshley expressed understanding she may request to initiate telepsychological services, and that request will be respected as long as it is feasible and clinically appropriate.  The risks for opting for in-person services was discussed. Morrie Sheldonshley verbally acknowledged understanding that by coming to the office, she is assuming the risk of exposure to the coronavirus or other public risk, and the risk may increase if Morrie Sheldonshley travels by public transportation, cab, or [employer] service. To obtain in-person services, Morrie Sheldonshley verbally agreed to taking certain precautions (e.g., screening prior to appointment; universal masking; social distancing of 6 feet; proper hand hygiene) set forth by [facility] to keep everyone safe from exposure and subsequent consequences. This information was shared by front desk staff either at the time of scheduling and/or during the check-in process. Morrie Sheldonshley expressed understanding that should she not adhere to these safeguards, it may result in starting/returning to a telepsychological service arrangement  and/or the exploration of other options for treatment. Morrie Sheldonshley acknowledged understanding that [facility] will follow the protocol set forth by [facility] should a patient present with a fever or other symptoms or disclose recent exposure, which will include rescheduling the appointment. Furthermore, Morrie Sheldonshley acknowledged understanding that precautions may change if additional local, state or federal orders or guidelines are published.This provider also shared that if Morrie Sheldonshley tests positive for the coronavirus and was in the [facility] clinic, this provider will follow [facility]'s disclosure policy. Similarly, this provider will follow [facility]'s disclosure policy should this provider or staff test positive for the coronavirus. To avoid handling of paper/writing instruments and increasing likelihood of touching, verbal consent was obtained by Morrie SheldonAshley during today's appointment prior to proceeding. Morrie Sheldonshley provided verbal consent to proceed, and acknowledged understanding that by verbally consenting to proceed, she is agreeable to all information noted above.   Informed Consent: The provider's role was explained to [employer]. The provider reviewed and discussed issues of confidentiality, privacy, and limits therein (e.g., reporting obligations). In addition to verbal informed consent, written informed consent for psychological services was obtained prior to the initial appointment. Since the clinic is not a 24/7 crisis center, mental health emergency resources were shared and this  provider explained MyChart, e-mail, voicemail, and/or other messaging systems should be utilized only for non-emergency reasons. This provider also explained that information obtained during appointments will be placed in Caryssa's medical record and relevant information will be shared with other providers at [facility] for coordination of care. Moreover, Morrie Sheldonshley agreed information may be  shared with other [facility] providers as needed for coordination of care. By signing the service agreement document, Morrie Sheldonshley provided written consent for coordination of care. Morrie SheldonAshley  also verbally acknowledged understanding she is ultimately responsible for understanding her insurance benefits for services. Deshanae  acknowledged understanding that appointments cannot be recorded without both party consent. Jakeline verbally consented to proceed.  Chief Complaint/HPI: Phillis was referred by Dr. Corinna Capra due to depression with emotional eating behaviors. Per the note for the initial visit with Dr. Corinna Capra on November 18, 2019, "Xenia is struggling with emotional eating and using food for comfort to the extent that it is negatively impacting her health. She has been working on behavior modification techniques to help reduce her emotional eating and has been somewhat successful. She shows no sign of suicidal or homicidal ideations. Noelene reports emotional eating. She is taking medication for depression, which is controlled per the patient." During the initial appointment, Shaletta reported experiencing the following: frequently making poor food choices, struggling with emotional eating, cravings for sweets, skipping lunch 3-4 times a week and sometimes eating larger portions that normal. Elka's Food and Mood (modified PHQ-9) score on November 18, 2019 was 15.  During today's appointment, Ensley was verbally administered a questionnaire assessing various behaviors related to emotional eating. Emanuelle endorsed the following: eat certain foods when you are anxious, stressed, depressed, or your feelings are hurt, use food to help you cope with emotional situations, find food is comforting to you, overeat when you are angry or upset, overeat when you are worried about something, overeat frequently when you are bored or lonely, overeat when you are alone, but eat much less when you are with other people  and eat as a reward. She shared she craves sweets and potatoes. Nikala believes the onset of emotional eating was likely after high school and described the current frequency of emotional eating as "sometimes once a day." In addition, Kadra denied a history of binge eating. Aimy denied a history of restricting food intake, purging and engagement in other compensatory strategies, and has never been diagnosed with an eating disorder. She also denied a history of treatment for emotional eating. Moreover, Dorothe indicated depression, being upset about something, and loneliness triggers emotional eating. She noted previously outdoor activities made emotional eating better, but she is unsure what makes it better now. She also indicated she tends to engage in "comfort eating at night. Furthermore, Misako denied other problems of concern.    Mental Status Examination:  Appearance: well groomed and appropriate hygiene  Behavior: appropriate to circumstances Mood: euthymic Affect: mood congruent Speech: normal in rate, volume, and tone Eye Contact: appropriate Psychomotor Activity: appropriate Gait: ambulates with cane Thought Process: linear, logical, and goal directed  Thought Content/Perception: denies suicidal and homicidal ideation, plan, and intent and no hallucinations, delusions, bizarre thinking or behavior reported or observed Orientation: time, person, place and purpose of appointment Memory/Concentration: memory, attention, language, and fund of knowledge intact  Insight/Judgment: good  Family & Psychosocial History: Percy reported she is not in a relationship and she does not have any children. She indicated she is currently on disability, noting she worked with [employer] for [employer] for 15 years. Additionally, Lukisha shared her highest level of education obtained is a bachelor's degree. Currently, Camreigh's social support system consists of her friend Corrie Dandy), mother, sister, and neighbors.  Moreover, Shajuan stated she resides with her three dogs and three cats.   Medical History:  Past Medical History:  Diagnosis Date  . Allergic rhinitis   . Anxiety   . Arthritis   . Back pain   . Constipated   . Depression   .  Diaphragmatic hernia   . Fatty liver   . GERD (gastroesophageal reflux disease)   . Hypertension   . Hypothyroidism   . IBS (irritable bowel syndrome)   . Joint pain   . Kidney problem   . Lactose intolerance   . Morbid obesity (HCC)   . OSA (obstructive sleep apnea)   . Osteoarthritis   . Seasonal allergies   . Vitamin D deficiency    Past Surgical History:  Procedure Laterality Date  . CARPAL TUNNEL RELEASE     rt/lt  . FINGER ARTHRODESIS  11/08/2011   Procedure: ARTHRODESIS FINGER;  Surgeon: Wyn Forster., MD;  Location: [facility];  Service: Orthopedics;  Laterality: Left;  left thumb carpal-metacarpal fusion  . KNEE ARTHROSCOPY     both knees  . PARTIAL KNEE ARTHROPLASTY  11/05/2012   Procedure: UNICOMPARTMENTAL KNEE;  Surgeon: [name], MD;  Location: [facility];  Service: Orthopedics;  Laterality: Right;  . TONSILLECTOMY    . 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 acetaminophen (TYLENOL) 500 MG tablet Take 500-1,000 mg by mouth at bedtime. Dose varies based on pain level    . aspirin EC 81 MG tablet Take 81 mg by mouth daily.    . B Complex-C (B-COMPLEX WITH VITAMIN C) tablet Take 1 tablet by mouth daily.    Marland Kitchen buPROPion (WELLBUTRIN XL) 150 MG 24 hr tablet Take 300 mg by mouth daily.   0  . cetirizine (ZYRTEC) 10 MG tablet Take 10 mg by mouth daily.    . Cholecalciferol (VITAMIN D) 2000 UNITS tablet Take 2,000 Units by mouth daily.    . diphenhydrAMINE (BENADRYL) 25 mg capsule Take 1 capsule (25 mg total) by mouth every 6 (six) hours as needed for itching,  allergies or sleep. (Patient taking differently: Take 50 mg by mouth at bedtime. ) 30 capsule   . EPINEPHrine (EPIPEN 2-PAK) 0.3 mg/0.3 mL IJ SOAJ injection Inject 0.3 mg into the muscle once as needed (allergic reaction).     Marland Kitchen FLUoxetine (PROZAC) 20 MG capsule Take 20 mg by mouth daily.  3  . levonorgestrel (MIRENA) 20 MCG/24HR IUD 1 each by Intrauterine route once.    Marland Kitchen levothyroxine (SYNTHROID, LEVOTHROID) 175 MCG tablet Take 175 mcg by mouth daily before breakfast.    . magnesium 30 MG tablet Take 30 mg by mouth 2 (two) times daily.    . meloxicam (MOBIC) 15 MG tablet Take 15 mg by mouth daily.  0  . montelukast (SINGULAIR) 10 MG tablet Take 10 mg by mouth at bedtime.    Marland Kitchen omeprazole (PRILOSEC) 40 MG capsule Take 40 mg by mouth at bedtime.     . pravastatin (PRAVACHOL) 40 MG tablet Take 40 mg by mouth at bedtime.     Marland Kitchen tiZANidine (ZANAFLEX) 4 MG tablet Take 4 mg by mouth at bedtime.    . triamcinolone cream (KENALOG) 0.1 % Apply 1 application topically 2 (two) times daily as needed (rash).      No current facility-administered medications on file prior to visit.  Nadelyn denied a history of head injuries and loss of consciousness.    Mental Health History: Natina stated she first attended therapeutic services approximately 12 years ago due to depression, noting she attended services for 6-8 months with a psychiatrist. She also attended services 5-6 years ago  for three sessions via [facility] for depression and anger management. Morrie Sheldonshley reported there is no history of hospitalizations for psychiatric concerns. Currently, her PCP prescribes Wellbutrin and Prozac, which she described as helpful. Morrie Sheldonshley endorsed a family history of mental health related concerns. She shared her sister suffers from panic attacks. Morrie Sheldonshley reported there is no history of trauma including psychological, physical  and sexual abuse, as well as neglect.   Morrie Sheldonshley stated she began experiencing suicidal ideation during her  2920s, noting she was "not happy" in college and her paternal grandfather passed away that year. She added, "I'd also gotten into  drinking." She stated she "took a bunch of pills [aspirin and pain medication]." Morrie Sheldonshley stated she informed her roommate and received medical attention. Morrie Sheldonshley reported she attempted suicide again in her "latter 5120s," noting she was having relationship issues and she took pain pills. Morrie Sheldonshley noted a friend came to check on her and stayed with her. She did not receive medical attention. Morrie Sheldonshley further recalled experiencing suicidal ideation between the aforementioned attempts when she was at a party "drinking too much" and she "threatened" to kill herself. She denied suicidal gestures and attempts. Morrie Sheldonshley reported she continues to experience suicidal ideation, noting the last time was "a few months ago." She denied experiencing suicidal plan and intent since her 3620s and she stopped drinking alcohol 12 years ago on her own. She also shared she "purposely" does not have access to firearms and weapons. Notably, Morrie Sheldonshley endorsed item 9 (i.e., "Do you feel that your weight problem is so hopeless that sometimes life doesn't seem worth living?") on the modified PHQ-9 during her initial appointment with Dr. Corinna CapraAngel Brown on November 18, 2019. She indicated she endorsed the item due to hopelessness about her weight and not due to suicidal ideation.   A patient safety plan was completed. The plan included the following information: warning signs that a crisis may be developing; internal coping strategies (e.g., physical activity or a relaxation technique); people and social settings that provide distraction; people to ask for help; professional and/or agencies to contact during a crisis; ways to make the environment safe; and the most important thing worth living for. Phone numbers were noted, including the number for the Suicide Prevention Lifeline. The following information was noted on Morrie Sheldonshley 's  safety plan:   Step 1: Warning signs (thoughts, images, mood, situation, behavior) that a crisis may be developing: 1. Feeling rejected 2. Feeling isolated  Step 2: Internal coping strategies- Things I can do to take my mind off my problems without contacting another person (relaxation technique, physical activity): 1. Spend time with dogs 2. Spend time outside 3. Watch TV- Kung [employer] Kong[employer]anda, [employer] Se[employer]ph, Shrek 4. Go for a walk 5. Pray   Step 3: People and social settings that provide distraction: 1. Name: Berenice PrimasMary [friend]       Phone: [phone] 2.   Name: Alona BeneJoyce [neighbor]       Phone: walk over 3.   Place: Backyard 4.   Place: [facility] house 5.   Go to church  Step 4: People whom I can ask for help: 1.   Name: Berenice PrimasMary [friend]       Phone: [phone] 2.   Name: Mom       Phone: [phone]  Step 5: Professionals or agencies I can contact during a crisis [name][Arnecia was provided with a handout with emergency resources; therefore, she requested this provider write on the safety plan to refer to the  handout.]  Step 6: Making the environment safe: 1. Not having access to firearms and weapons 2.  Keeping fur babies close 3. Limit medications in home  Protective factors were identified under the section of the safety plan indicating  "The one thing that is most important to me and worth living for is." The following protective factors were noted: fur babies, parents, and Aime Carreras was provided with the original copy of the safety plan in a folder. This provider explained that once she left this office with the safety plan, this provider could not ensure confidentiality; therefore, this provider encouraged Subrina to place her safety plan in a safe, yet accessible place. She acknowledged understanding, and agreed. Nykayla's confidence in utilizing emergency resources should there be an intensification of suicidal ideation was assessed on a scale of one to ten where one is not  confident and ten is extremely confident. She reported her confidence is a 10.  Fumi described her typical mood as "usually pretty good." Aside from concerns noted above and endorsed on the PHQ-9, Sashay reported experiencing decreased motivation during bad weather; hopelessness about feeling self-isolated; decreased self-esteem; crying spells; and anxiety in large crowds. She also endorsed memory concerns, adding her PCP is aware. In addition, she reported a history of angry outbursts and "minor" panic attacks when she was "burned out" at work. Mehr denied current alcohol use. She denied tobacco use. She denied illicit/recreational substance use. Regarding caffeine intake, Elie reported consuming 2-3 cups of coffee daily and 1-2 cans of diet soda daily. She disclosed a history of consuming 6-8, 20oz sodas daily. Furthermore, Rashell indicated she is not experiencing the following: hallucinations and delusions, paranoia and symptoms of mania (e.g., expansive mood, flighty ideas, decreased need for sleep, engagement in risky behaviors). She also denied current suicidal ideation, plan, and intent; history of and current homicidal ideation, plan, and intent; and history of and current engagement in self-harm.  The following strengths were reported by [name]: caring, very good at organizing, and creative. The following strengths were observed by this provider: ability to express thoughts and feelings during the therapeutic session, ability to establish and benefit from a therapeutic relationship, willingness to work toward established goal(s) with the clinic and ability to engage in reciprocal conversation.  Legal History: Meyah reported there is no history of legal involvement.   Structured Assessments Results: The Patient Health Questionnaire-9 (PHQ-9) is a self-report measure that assesses symptoms and severity of depression over the course of the last two weeks. Agape obtained a score of 6 suggesting  mild depression. Terrye finds the endorsed symptoms to be very difficult. [0= Not at all; 1= Several days; 2= More than half the days; 3= Nearly every day] Little interest or pleasure in doing things 1  Feeling down, depressed, or hopeless 1  Trouble falling or staying asleep, or sleeping too much 0  Feeling tired or having little energy 1  Poor appetite or overeating 2  Feeling bad about yourself --- or that you are a failure or have let yourself or your family down 1  Trouble concentrating on things, such as reading the newspaper or watching television 0  Moving or speaking so slowly that other people could have noticed? Or the opposite --- being so fidgety or restless that you have been moving around a lot more than usual 0  Thoughts that you would be better off dead or hurting yourself in some way 0  PHQ-9 Score 6    The Generalized Anxiety  Disorder-7 (GAD-7) is a brief self-report measure that assesses symptoms of anxiety over the course of the last two weeks. Cloma obtained a score of 0. [0= Not at all; 1= Several days; 2= Over half the days; 3= Nearly every day] Feeling nervous, anxious, on edge 0  Not being able to stop or control worrying 0  Worrying too much about different things 0  Trouble relaxing 0  Being so restless that it's hard to sit still 0  Becoming easily annoyed or irritable 0  Feeling afraid as if something awful might happen 0  GAD-7 Score 0   Interventions:  Conducted a chart review Focused on rapport building Verbally administered PHQ-9 and GAD-7 for symptom monitoring Verbally administered Food & Mood questionnaire to assess various behaviors related to emotional eating. Provided emphatic reflections and validation Collaborated with patient on a treatment goal  Conducted a risk assessment Jointly developed safety plan for patient Recommended/discussed option for longer-term therapeutic services  Provisional DSM-5 Diagnosis: 296.31 (F33.0) Major  Depressive Disorder, Recurrent Episode, Mild, With Anxious Distress  Plan: Kaye appears able and willing to participate as evidenced by collaboration on a treatment goal, engagement in reciprocal conversation, and asking questions as needed for clarification. The next appointment will be scheduled in two weeks, which will be via [employer]. The following treatment goal was established: decrease emotional eating. This provider will regularly review the treatment plan and medical chart to keep informed of status changes and assess for safety. Dayra expressed understanding and agreement with the initial treatment plan of care. In addition, a referral will be placed for [place].

## 2019-11-19 LAB — INSULIN, RANDOM: INSULIN: 23.2 u[IU]/mL (ref 2.6–24.9)

## 2019-11-19 LAB — VITAMIN D 25 HYDROXY (VIT D DEFICIENCY, FRACTURES): Vit D, 25-Hydroxy: 64.9 ng/mL (ref 30.0–100.0)

## 2019-11-19 LAB — VITAMIN B12: Vitamin B-12: 655 pg/mL (ref 232–1245)

## 2019-11-20 ENCOUNTER — Ambulatory Visit (INDEPENDENT_AMBULATORY_CARE_PROVIDER_SITE_OTHER): Payer: 59 | Admitting: Psychology

## 2019-11-20 ENCOUNTER — Other Ambulatory Visit: Payer: Self-pay

## 2019-11-20 DIAGNOSIS — F330 Major depressive disorder, recurrent, mild: Secondary | ICD-10-CM

## 2019-11-27 NOTE — Progress Notes (Signed)
Office: [phone]  /  Fax: [phone]    Date: December 05, 2019   Appointment Start Time: 10:09am Duration: 23 minutes Provider: Glennie Isle, Psy.D. Type of Session: Individual Therapy  Location of Patient: Home Location of Provider: Provider's Home Type of Contact: Telepsychological Visit via Telephone Call  Session Content: Prior to initiating telepsychological services, Melissa [name] completed an informed consent document, which included the development of a safety plan (i.e., an emergency contact, nearest emergency room, and emergency resources) in the event of an emergency/crisis. Melissa [name] expressed understanding of the rationale of the safety plan. Melissa [name] verbally acknowledged understanding she is ultimately responsible for understanding her insurance benefits for telepsychological and in-person services. This provider also reviewed confidentiality, as it relates to telepsychological services, as well as the rationale for telepsychological services (i.e., to reduce exposure risk to COVID-19). Melissa [name]  acknowledged understanding that appointments cannot be recorded without both party consent and she is aware she is responsible for securing confidentiality on her end of the session. Melissa [name] verbally consented to proceed.   Melissa [name] is a 54 y.o. female presenting via Telephone Call for a follow-up appointment to address the previously established treatment goal of decreasing emotional eating. Of note, Webex was not utilized as Melissa [name] was unsure how to connect; therefore, this provider shared directions on how to connect for future appointments via Webex. Today's appointment was a telepsychological visit due to COVID-19. Melissa [name] provided verbal consent for today's telepsychological appointment and she is aware she is responsible for securing confidentiality on her end of the session. Prior to proceeding with today's appointment, Melissa [name]'s physical location at the time of this appointment was obtained as  well a phone number she could be reached at in the event of technical difficulties. Melissa [name] and this provider participated in today's telepsychological service.   This provider conducted a brief check-in. Melissa [name] shared she has been "working on her diet plan," adding she lost three pounds. A risk assessment was completed. Melissa [name] denied experiencing suicidal and homicidal ideation, plan, and intent since the last appointment with this provider. She reported she continues to have easy access to the developed safety plan, and continues to acknowledge understanding regarding the importance of reaching out to trusted individuals and/or emergency resources if she is unable to ensure safety. Psychoeducation regarding emotional versus physical hunger was provided. Melissa [name] was given a handout to utilize between now and the next appointment to increase awareness of hunger patterns and subsequent eating. Melissa [name] provided verbal consent during today's appointment for this provider to send a handout about hunger patterns via e-mail. Overall, Melissa [name] was receptive to today's appointment as evidenced by openness to sharing, responsiveness to feedback, and willingness to explore hunger patterns.  Mental Status Examination:  Appearance: unable to assess  Behavior: unable to assess Mood: euthymic Affect: unable to fully assess Speech: normal in rate, volume, and tone Eye Contact: unable to assess Psychomotor Activity: unable to assess  Gait: unable to assess Thought Process: linear, logical, and goal directed  Thought Content/Perception: denies suicidal and homicidal ideation, plan, and intent and no hallucinations, delusions, bizarre thinking or behavior reported or observed Orientation: time, person, place and purpose of appointment Memory/Concentration: memory, attention, language, and fund of knowledge intact  Insight/Judgment: good  Interventions:  Conducted a brief chart review Conducted a risk  assessment Employed supportive psychotherapy interventions to facilitate reduced distress and to improve coping skills with identified stressors Employed motivational interviewing skills to assess patient's willingness/desire to adhere to recommended medical treatments and assignments Psychoeducation provided regarding physical  versus emotional hunger  DSM-5 Diagnosis: 296.31 (F33.0) Major Depressive Disorder, Recurrent Episode, Mild, With Anxious Distress  Treatment Goal & Progress: During the initial appointment with this provider, the following treatment goal was established: decrease emotional eating. Progress is limited, as Melissa [name] has just begun treatment with this provider; however, she is receptive to the interaction and interventions and rapport is being established.   Plan: The next appointment will be scheduled in approximately two weeks, which will be via [employer]. The next session will focus on working towards the established treatment goal.

## 2019-12-02 ENCOUNTER — Ambulatory Visit (INDEPENDENT_AMBULATORY_CARE_PROVIDER_SITE_OTHER): Payer: 59 | Admitting: Bariatrics

## 2019-12-02 ENCOUNTER — Other Ambulatory Visit (INDEPENDENT_AMBULATORY_CARE_PROVIDER_SITE_OTHER): Payer: Self-pay | Admitting: Psychology

## 2019-12-02 ENCOUNTER — Other Ambulatory Visit: Payer: Self-pay

## 2019-12-02 ENCOUNTER — Encounter (INDEPENDENT_AMBULATORY_CARE_PROVIDER_SITE_OTHER): Payer: Self-pay | Admitting: Bariatrics

## 2019-12-02 VITALS — BP 105/75 | HR 79 | Temp 98.8°F | Ht 62.0 in | Wt 253.0 lb

## 2019-12-02 DIAGNOSIS — F330 Major depressive disorder, recurrent, mild: Secondary | ICD-10-CM

## 2019-12-02 DIAGNOSIS — E8881 Metabolic syndrome: Secondary | ICD-10-CM

## 2019-12-02 DIAGNOSIS — F3289 Other specified depressive episodes: Secondary | ICD-10-CM

## 2019-12-02 DIAGNOSIS — Z6841 Body Mass Index (BMI) 40.0 and over, adult: Secondary | ICD-10-CM

## 2019-12-02 NOTE — Progress Notes (Signed)
Chief Complaint:   OBESITY Melissa [name] is here to discuss her progress with her obesity treatment plan along with follow-up of her obesity related diagnoses. Melissa [name] is on the Category 2 Plan and states she is following her eating plan approximately 98% of the time. Melissa [name] states she is walking 20-30 minutes 5 times per week.  Today's visit was #: 2 Starting weight: 256 lbs Starting date: 11/18/2019 Today's weight: 253 lbs Today's date: 12/02/2019 Total lbs lost to date: 3 Total lbs lost since last in-office visit: 3  Interim History: Melissa [name] is lactose intolerant. She did not struggle with the plan. She has cut back on sugar and is doing better with her water intake. She states she is feeling better and has more energy.  Subjective:   Insulin resistance. Melissa [name] has a diagnosis of insulin resistance based on her elevated fasting insulin level >5. She continues to work on diet and exercise to decrease her risk of diabetes. No polyphagia. Last A1c was reported to be 5.5.  Lab Results  Component Value Date   INSULIN 23.2 11/18/2019   No results found for: HGBA1C  Other depression, with emotional eating. Melissa [name] is struggling with emotional eating and using food for comfort to the extent that it is negatively impacting her health. She has been working on behavior modification techniques to help reduce her emotional eating and has been somewhat successful. She shows no sign of suicidal or homicidal ideations. Melissa [name] is seeing Dr. Mallie Mussel. She reports decreased cravings.   Assessment/Plan:   Insulin resistance. Melissa [name] will continue to work on weight loss, exercise, increasing healthy fats and protein, and decreasing simple carbohydrates to help decrease the risk of diabetes. Melissa [name] agreed to follow-up with [name] as directed to closely monitor her progress.  Other depression, with emotional eating. Behavior modification techniques were discussed today to help Melissa [name] deal with her  emotional/non-hunger eating behaviors.  Orders and follow up as documented in patient record. She will follow-up with Dr. Mallie Mussel as directed and will work on some CBT techniques.  Class 3 severe obesity with serious comorbidity and body mass index (BMI) of 45.0 to 49.9 in adult, unspecified obesity type ([place]).  Melissa [name] is currently in the action stage of change. As such, her goal is to continue with weight loss efforts. She has agreed to the Category 2 Plan.   We independently reviewed labs with the patient including Vitamin D, Vitamin B12, and insulin.  She will work on meal planning and will continue to write down foods.  Exercise goals: Melissa [name] will continue walking and continue 6,000-7,000 steps 5 days a week.  Behavioral modification strategies: increasing lean protein intake, decreasing simple carbohydrates, increasing vegetables, increasing water intake, decreasing eating out, no skipping meals, meal planning and cooking strategies, keeping healthy foods in the home, better snacking choices and planning for success.  Melissa [name] has agreed to follow-up with our clinic in 2 weeks. She was informed of the importance of frequent follow-up visits to maximize her success with intensive lifestyle modifications for her multiple health conditions.   Objective:   Blood pressure 105/75, pulse 79, temperature 98.8 F (37.1 C), temperature source Oral, height 5\' 2"  (1.575 m), weight 253 lb (114.8 kg), SpO2 97 %. Body mass index is 46.27 kg/m.  General: Cooperative, alert, well developed, in no acute distress. HEENT: Conjunctivae and lids unremarkable. Cardiovascular: Regular rhythm.  Lungs: Normal work of breathing. Neurologic: No focal deficits. Uses a cane for ambulation.   Lab Results  Component  Value Date   CREATININE 0.82 04/11/2015   BUN 10 04/11/2015   NA 139 04/11/2015   K 4.0 04/11/2015   CL 107 04/11/2015   CO2 24 04/11/2015   Lab Results  Component Value Date   ALT 17  04/11/2015   AST 19 04/11/2015   ALKPHOS 81 04/11/2015   BILITOT 0.2 (L) 04/11/2015   No results found for: HGBA1C Lab Results  Component Value Date   INSULIN 23.2 11/18/2019   No results found for: TSH No results found for: CHOL, HDL, LDLCALC, LDLDIRECT, TRIG, CHOLHDL Lab Results  Component Value Date   WBC 8.7 04/11/2015   HGB 12.3 04/11/2015   HCT 38.1 04/11/2015   MCV 86.8 04/11/2015   PLT 371 04/11/2015    Attestation Statements:   Reviewed by clinician on day of visit: allergies, medications, problem list, medical history, surgical history, family history, social history, and previous encounter notes.  Time spent on visit including pre-visit chart review and post-visit care was 30 minutes.   Fernanda Drum, am acting as Energy manager for [employer], DO   I have reviewed the above documentation for accuracy and completeness, and I agree with the above. Corinna Capra, DO

## 2019-12-05 ENCOUNTER — Other Ambulatory Visit: Payer: Self-pay

## 2019-12-05 ENCOUNTER — Ambulatory Visit (INDEPENDENT_AMBULATORY_CARE_PROVIDER_SITE_OTHER): Payer: 59 | Admitting: Psychology

## 2019-12-05 DIAGNOSIS — F330 Major depressive disorder, recurrent, mild: Secondary | ICD-10-CM | POA: Diagnosis not present

## 2019-12-10 NOTE — Progress Notes (Signed)
Office: [phone]  /  Fax: [phone]    Date: December 24, 2019   Appointment Start Time: 10:05am Duration: 25 minutes Provider: Lawerance Cruel, Psy.D. Type of Session: Individual Therapy  Location of Patient: Home Location of Provider: Provider's Home Type of Contact: Telepsychological Visit via [employer] Web[employer]  Session Content: This provider called Melissa [name] at 10:02am as she did not present for the WebEx appointment. The e-mail with the secure link was re-sent and directions on connecting were provided. As such, today's appointment was initiated 5 minutes late.  Melissa [name] is a 54 y.o. female presenting via [employer] WebEx for a follow-up appointment to address the previously established treatment goal of decreasing emotional eating. Today's appointment was a telepsychological visit due to COVID-19. Melissa [name] provided verbal consent for today's telepsychological appointment and she is aware she is responsible for securing confidentiality on her end of the session. Prior to proceeding with today's appointment, Melissa [name]'s physical location at the time of this appointment was obtained as well a phone number she could be reached at in the event of technical difficulties. Melissa [name] and this provider participated in today's telepsychological service.   This provider conducted a brief check-in. Melissa [name] shared about recent events, including working on projects. A risk assessment was completed. Melissa [name] denied experiencing suicidal and homicidal ideation, plan, and intent since the last appointment with this provider. She reported she continues to have easy access to the developed safety plan, and continues to acknowledge understanding regarding the importance of reaching out to trusted individuals and/or emergency resources if she is unable to ensure safety. Regarding eating, Melissa [name] shared instances of stress eating, adding she lost another pound recently. Psychoeducation regarding triggers for emotional eating was provided.  Melissa [name] was provided a handout, and encouraged to utilize the handout to increase awareness of triggers and frequency. Melissa [name] agreed. This provider also discussed behavioral strategies for specific triggers, such as placing the utensil down when conversing to avoid mindless eating. Melissa [name] provided verbal consent during today's appointment for this provider to send the handout about triggers via e-mail. Melissa [name] was receptive to today's appointment as evidenced by openness to sharing, responsiveness to feedback, and willingness to explore triggers for emotional eating.  Mental Status Examination:  Appearance: well groomed and appropriate hygiene  Behavior: appropriate to circumstances Mood: euthymic Affect: mood congruent Speech: normal in rate, volume, and tone Eye Contact: appropriate Psychomotor Activity: appropriate Gait: unable to assess Thought Process: linear, logical, and goal directed  Thought Content/Perception: denies suicidal and homicidal ideation, plan, and intent and no hallucinations, delusions, bizarre thinking or behavior reported or observed Orientation: time, person, place and purpose of appointment Memory/Concentration: memory, attention, language, and fund of knowledge intact  Insight/Judgment: good  Interventions:  Conducted a brief chart review Conducted a risk assessment Provided empathic reflections and validation Employed supportive psychotherapy interventions to facilitate reduced distress and to improve coping skills with identified stressors Employed motivational interviewing skills to assess patient's willingness/desire to adhere to recommended medical treatments and assignments Psychoeducation provided regarding triggers for emotional eating  DSM-5 Diagnosis: 296.31 (F33.0) Major Depressive Disorder, Recurrent Episode, Mild, With Anxious Distress  Treatment Goal & Progress: During the initial appointment with this provider, the following treatment goal was  established: decrease emotional eating. Melissa [name] has demonstrated progress in her goal as evidenced by increased awareness of hunger patterns.   Plan: The next appointment will be scheduled in two weeks, which will be via [employer]. The next session will focus on working towards the established treatment goal. In addition, [employer]  is scheduled for an initial appointment with Melissa [name] ([place]) on December 27, 2019.

## 2019-12-16 ENCOUNTER — Other Ambulatory Visit: Payer: Self-pay

## 2019-12-16 ENCOUNTER — Encounter (INDEPENDENT_AMBULATORY_CARE_PROVIDER_SITE_OTHER): Payer: Self-pay | Admitting: Physician Assistant

## 2019-12-16 ENCOUNTER — Ambulatory Visit (INDEPENDENT_AMBULATORY_CARE_PROVIDER_SITE_OTHER): Payer: 59 | Admitting: Physician Assistant

## 2019-12-16 VITALS — BP 124/69 | HR 79 | Temp 98.5°F | Ht 62.0 in | Wt 252.0 lb

## 2019-12-16 DIAGNOSIS — Z6841 Body Mass Index (BMI) 40.0 and over, adult: Secondary | ICD-10-CM

## 2019-12-16 DIAGNOSIS — E7849 Other hyperlipidemia: Secondary | ICD-10-CM | POA: Diagnosis not present

## 2019-12-16 DIAGNOSIS — F3289 Other specified depressive episodes: Secondary | ICD-10-CM | POA: Diagnosis not present

## 2019-12-16 NOTE — Progress Notes (Signed)
Chief Complaint:   OBESITY Melissa [name] is here to discuss her progress with her obesity treatment plan along with follow-up of her obesity related diagnoses. Melissa [name] is on the Category 2 Plan and states she is following her eating plan approximately 90% of the time. Melissa [name] states she is walking 20 minutes 3-5 times per week.  Today's visit was #: 3 Starting weight: 256 lbs Starting date: 11/18/2019 Today's weight: 252 lbs Today's date: 12/16/2019 Total lbs lost to date: 4 Total lbs lost since last in-office visit: 1  Interim History: Melissa [name] thinks she may be overeating her snack calories due to some stress and emotional eating. She is seeing Dr. Mallie Mussel to help with this. She notes some hunger between lunch and dinner and sometimes has a hard time eating all of her protein at dinner.  Subjective:   Other hyperlipidemia. Melissa [name] is on pravastatin. No chest pain or myalgias.  No results found for: CHOL, HDL, LDLCALC, LDLDIRECT, TRIG, CHOLHDL Lab Results  Component Value Date   ALT 17 04/11/2015   AST 19 04/11/2015   ALKPHOS 81 04/11/2015   BILITOT 0.2 (L) 04/11/2015   The ASCVD Risk score Mikey Bussing DC Jr., et al., 2013) failed to calculate for the following reasons:   Cannot find a previous HDL lab  Other depression, with emotional eating. Melissa [name] is struggling with emotional eating and using food for comfort to the extent that it is negatively impacting her health. She has been working on behavior modification techniques to help reduce her emotional eating and has been somewhat successful. She shows no sign of suicidal or homicidal ideations. Melissa [name] is on Wellbutrin and is seeing Dr. Mallie Mussel. She notes some emotional and stress eating recently.  Assessment/Plan:   Other hyperlipidemia. Cardiovascular risk and specific lipid/LDL goals reviewed.  We discussed several lifestyle modifications today and Melissa [name] will continue to work on diet, exercise and weight loss efforts. Orders and  follow up as documented in patient record. Melissa [name] will continue with medications as directed and weight loss.   Counseling Intensive lifestyle modifications are the first line treatment for this issue. . Dietary changes: Increase soluble fiber. Decrease simple carbohydrates. . Exercise changes: Moderate to vigorous-intensity aerobic activity 150 minutes per week if tolerated. . Lipid-lowering medications: see documented in medical record.  Other depression, with emotional eating. Behavior modification techniques were discussed today to help Melissa [name] deal with her emotional/non-hunger eating behaviors.  Orders and follow up as documented in patient record. Melissa [name] will continue with Wellbutrin and continue seeing Dr. Mallie Mussel.  Class 3 severe obesity with serious comorbidity and body mass index (BMI) of 45.0 to 49.9 in adult, unspecified obesity type (Melissa [name]).  Melissa [name] is currently in the action stage of change. As such, her goal is to continue with weight loss efforts. She has agreed to the Category 2 Plan.   Exercise goals: For substantial health benefits, adults should do at least 150 minutes (2 hours and 30 minutes) a week of moderate-intensity, or 75 minutes (1 hour and 15 minutes) a week of vigorous-intensity aerobic physical activity, or an equivalent combination of moderate- and vigorous-intensity aerobic activity. Aerobic activity should be performed in episodes of at least 10 minutes, and preferably, it should be spread throughout the week.  Behavioral modification strategies: meal planning and cooking strategies and emotional eating strategies.  Melissa [name] has agreed to follow-up with our clinic in 2 weeks. She was informed of the importance of frequent follow-up visits to maximize her success with intensive lifestyle modifications  for her multiple health conditions.   Objective:   Blood pressure 124/69, pulse 79, temperature 98.5 F (36.9 C), temperature source Oral, height 5\' 2"  (1.575 m),  weight 252 lb (114.3 kg), SpO2 97 %. Body mass index is 46.09 kg/m.  General: Cooperative, alert, well developed, in no acute distress. HEENT: Conjunctivae and lids unremarkable. Cardiovascular: Regular rhythm.  Lungs: Normal work of breathing. Neurologic: No focal deficits.   Lab Results  Component Value Date   CREATININE 0.82 04/11/2015   BUN 10 04/11/2015   NA 139 04/11/2015   K 4.0 04/11/2015   CL 107 04/11/2015   CO2 24 04/11/2015   Lab Results  Component Value Date   ALT 17 04/11/2015   AST 19 04/11/2015   ALKPHOS 81 04/11/2015   BILITOT 0.2 (L) 04/11/2015   No results found for: HGBA1C Lab Results  Component Value Date   INSULIN 23.2 11/18/2019   No results found for: TSH No results found for: CHOL, HDL, LDLCALC, LDLDIRECT, TRIG, CHOLHDL Lab Results  Component Value Date   WBC 8.7 04/11/2015   HGB 12.3 04/11/2015   HCT 38.1 04/11/2015   MCV 86.8 04/11/2015   PLT 371 04/11/2015   No results found for: IRON, TIBC, FERRITIN  Obesity Behavioral Intervention Documentation for Insurance:   Approximately 15 minutes were spent on the discussion below.  ASK: We discussed the diagnosis of obesity with 04/13/2015 today and Melissa [name] agreed to give Melissa [name] permission to discuss obesity behavioral modification therapy today.  ASSESS: Melissa [name] has the diagnosis of obesity and her BMI today is 46.2. Melissa [name] is in the action stage of change.   ADVISE: Melissa [name] was educated on the multiple health risks of obesity as well as the benefit of weight loss to improve her health. She was advised of the need for long term treatment and the importance of lifestyle modifications to improve her current health and to decrease her risk of future health problems.  AGREE: Multiple dietary modification options and treatment options were discussed and Melissa [name] agreed to follow the recommendations documented in the above note.  ARRANGE: Melissa [name] was educated on the importance of frequent visits to treat  obesity as outlined per CMS and USPSTF guidelines and agreed to schedule her next follow up appointment today.  Attestation Statements:   Reviewed by clinician on day of visit: allergies, medications, problem list, medical history, surgical history, family history, social history, and previous encounter notes.  IMorrie [name], am acting as transcriptionist for Marianna Payment, PA-C   I have reviewed the above documentation for accuracy and completeness, and I agree with the above. [name], PA-C

## 2019-12-24 ENCOUNTER — Other Ambulatory Visit: Payer: Self-pay

## 2019-12-24 ENCOUNTER — Ambulatory Visit (INDEPENDENT_AMBULATORY_CARE_PROVIDER_SITE_OTHER): Payer: 59 | Admitting: Psychology

## 2019-12-24 DIAGNOSIS — F330 Major depressive disorder, recurrent, mild: Secondary | ICD-10-CM | POA: Diagnosis not present

## 2019-12-24 NOTE — Progress Notes (Signed)
Office: [phone]  /  Fax: [phone]    Date: January 07, 2020   Appointment Start Time: 8:35am Duration: 25 minutes Provider: Glennie Isle, Psy.D. Type of Session: Individual Therapy  Location of Patient: Home Location of Provider: Provider's Home Type of Contact: Telepsychological Visit via [employer] WebEx  Session Content: This provider called [name] at 8:32am as she did not present for the WebEx appointment. The e-mail with the secure link was re-sent. As such, today's appointment was initiated 5 minutes late. Melissa [name] is a 54 y.o. female presenting via [place] for a follow-up appointment to address the previously established treatment goal of decreasing emotional eating. Today's appointment was a telepsychological visit due to COVID-19. [name] provided verbal consent for today's telepsychological appointment and she is aware she is responsible for securing confidentiality on her end of the session. Prior to proceeding with today's appointment, [name]'s physical location at the time of this appointment was obtained as well a phone number she could be reached at in the event of technical difficulties. Lynnetta and this provider participated in today's telepsychological service.   This provider conducted a brief check-in. [name] shared about recent events, including her mother visiting and changes to the farm. She shared about her visit with [name] ([place]), noting, "It went really well." Their next appointment is next week. In addition, she noted she continues to lose weight. A risk assessment was completed. [name] denied experiencing suicidal and homicidal ideation, plan, and intent since the last appointment with this provider. She reported she continues to have easy access to the developed safety plan, and continues to acknowledge understanding regarding the importance of reaching out to trusted individuals and/or emergency resources if she is unable to ensure  safety.   Triggers for emotional eating were reviewed. She noted, "It's actually helped a lot." [name] shared a reduction in emotional eating. Psychoeducation regarding mindfulness was provided. A handout was provided to [name] with further information regarding mindfulness, including exercises. This provider also explained the benefit of mindfulness as it relates to emotional eating. Donalyn was encouraged to engage in the provided exercises. [name] agreed. During today's appointment, [name] was led through a mindfulness exercise involving her senses. [name] provided verbal consent during today's appointment for this provider to send a handout about mindfulness via e-mail. [name] was receptive to today's appointment as evidenced by openness to sharing, responsiveness to feedback, and willingness to engage in mindfulness exercises to assist with coping.  Mental Status Examination:  Appearance: well groomed and appropriate hygiene  Behavior: appropriate to circumstances Mood: euthymic Affect: mood congruent Speech: normal in rate, volume, and tone Eye Contact: appropriate Psychomotor Activity: appropriate Gait: unable to assess Thought Process: linear, logical, and goal directed  Thought Content/Perception: denies suicidal and homicidal ideation, plan, and intent and no hallucinations, delusions, bizarre thinking or behavior reported or observed Orientation: time, person, place and purpose of appointment Memory/Concentration: memory, attention, language, and fund of knowledge intact  Insight/Judgment: good  Interventions:  Conducted a brief chart review Conducted a risk assessment Provided empathic reflections and validation Reviewed content from the previous session Employed supportive psychotherapy interventions to facilitate reduced distress and to improve coping skills with identified stressors Employed motivational interviewing skills to assess patient's willingness/desire to adhere to  recommended medical treatments and assignments Psychoeducation provided regarding mindfulness Engaged patient in mindfulness exercise(s) Employed acceptance and commitment interventions to emphasize mindfulness and acceptance without struggle  DSM-5 Diagnosis(es): 296.31 (F33.0) Major Depressive Disorder, Recurrent Episode, Mild, With Anxious Distress  Treatment  Goal & Progress: During the initial appointment with this provider, the following treatment goal was established: decrease emotional eating. [name] demonstrated progress in her goal as evidenced by increased awareness of hunger patterns, increased awareness of triggers for emotional eating and reduction in emotional eating. [name] also demonstrates willingness to engage in mindfulness exercises.  Plan: [name] declined future appointments with this provider as she will be continuing therapeutic services with [name] on a weekly basis. She acknowledged understanding that she may request a follow-up appointment with this provider in the future as long as she is still established with the clinic. No further follow-up planned by this provider.

## 2019-12-27 ENCOUNTER — Ambulatory Visit (INDEPENDENT_AMBULATORY_CARE_PROVIDER_SITE_OTHER): Payer: 59 | Admitting: Professional

## 2019-12-27 DIAGNOSIS — F331 Major depressive disorder, recurrent, moderate: Secondary | ICD-10-CM

## 2019-12-27 DIAGNOSIS — F411 Generalized anxiety disorder: Secondary | ICD-10-CM | POA: Diagnosis not present

## 2019-12-30 ENCOUNTER — Ambulatory Visit (INDEPENDENT_AMBULATORY_CARE_PROVIDER_SITE_OTHER): Payer: 59 | Admitting: Physician Assistant

## 2019-12-30 ENCOUNTER — Other Ambulatory Visit: Payer: Self-pay

## 2019-12-30 ENCOUNTER — Encounter (INDEPENDENT_AMBULATORY_CARE_PROVIDER_SITE_OTHER): Payer: Self-pay | Admitting: Physician Assistant

## 2019-12-30 VITALS — BP 135/74 | HR 88 | Temp 98.1°F | Ht 62.0 in | Wt 246.0 lb

## 2019-12-30 DIAGNOSIS — Z6841 Body Mass Index (BMI) 40.0 and over, adult: Secondary | ICD-10-CM | POA: Diagnosis not present

## 2019-12-30 DIAGNOSIS — E8881 Metabolic syndrome: Secondary | ICD-10-CM | POA: Diagnosis not present

## 2019-12-30 NOTE — Progress Notes (Signed)
Chief Complaint:   OBESITY Melissa [name] is here to discuss her progress with her obesity treatment plan along with follow-up of her obesity related diagnoses. Melissa [name] is on the Category 2 Plan and states she is following her eating plan approximately 95% of the time. Melissa [name] states she is walking 6,000 steps daily.   Today's visit was #: 4 Starting weight: 256 lbs Starting date: 11/18/2019 Today's weight: 246 lbs Today's date: 12/30/2019 Total lbs lost to date: 10 Total lbs lost since last in-office visit: 6  Interim History: Melissa [name] states she has been eating more at breakfast. She is also using snacks between meals, which has helped with her hunger.  Subjective:   Insulin resistance. Melissa [name] has a diagnosis of insulin resistance based on her elevated fasting insulin level >5. She continues to work on diet and exercise to decrease her risk of diabetes. She is on no medications. No polyphagia. Last insulin level was elevated.  Lab Results  Component Value Date   INSULIN 23.2 11/18/2019   No results found for: HGBA1C  Assessment/Plan:   Insulin resistance. Rami will continue to work on weight loss, exercise, and decreasing simple carbohydrates to help decrease the risk of diabetes. Melissa [name] agreed to follow-up with [name] as directed to closely monitor her progress.  Class 3 severe obesity with serious comorbidity and body mass index (BMI) of 45.0 to 49.9 in adult, unspecified obesity type ([place]).  Melissa [name] is currently in the action stage of change. As such, her goal is to continue with weight loss efforts. She has agreed to the Category 2 Plan.   Exercise goals: For substantial health benefits, adults should do at least 150 minutes (2 hours and 30 minutes) a week of moderate-intensity, or 75 minutes (1 hour and 15 minutes) a week of vigorous-intensity aerobic physical activity, or an equivalent combination of moderate- and vigorous-intensity aerobic activity. Aerobic activity should  be performed in episodes of at least 10 minutes, and preferably, it should be spread throughout the week.  Behavioral modification strategies: meal planning and cooking strategies and planning for success.  Melissa [name] has agreed to follow-up with our clinic in 2 weeks. She was informed of the importance of frequent follow-up visits to maximize her success with intensive lifestyle modifications for her multiple health conditions.   Objective:   Blood pressure 135/74, pulse 88, temperature 98.1 F (36.7 C), temperature source Oral, height 5\' 2"  (1.575 m), weight 246 lb (111.6 kg), SpO2 98 %. Body mass index is 44.99 kg/m.  General: Cooperative, alert, well developed, in no acute distress. HEENT: Conjunctivae and lids unremarkable. Cardiovascular: Regular rhythm.  Lungs: Normal work of breathing. Neurologic: No focal deficits.   Lab Results  Component Value Date   CREATININE 0.82 04/11/2015   BUN 10 04/11/2015   NA 139 04/11/2015   K 4.0 04/11/2015   CL 107 04/11/2015   CO2 24 04/11/2015   Lab Results  Component Value Date   ALT 17 04/11/2015   AST 19 04/11/2015   ALKPHOS 81 04/11/2015   BILITOT 0.2 (L) 04/11/2015   No results found for: HGBA1C Lab Results  Component Value Date   INSULIN 23.2 11/18/2019   No results found for: TSH No results found for: CHOL, HDL, LDLCALC, LDLDIRECT, TRIG, CHOLHDL Lab Results  Component Value Date   WBC 8.7 04/11/2015   HGB 12.3 04/11/2015   HCT 38.1 04/11/2015   MCV 86.8 04/11/2015   PLT 371 04/11/2015   No results found for: IRON, TIBC,  FERRITIN  Attestation Statements:   Reviewed by clinician on day of visit: allergies, medications, problem list, medical history, surgical history, family history, social history, and previous encounter notes.  Time spent on visit including pre-visit chart review and post-visit charting and care was 24 minutes.   [name] Payment, am acting as transcriptionist for [name], PA-C   I have  reviewed the above documentation for accuracy and completeness, and I agree with the above. [name], PA-C

## 2020-01-07 ENCOUNTER — Ambulatory Visit (INDEPENDENT_AMBULATORY_CARE_PROVIDER_SITE_OTHER): Payer: 59 | Admitting: Psychology

## 2020-01-07 ENCOUNTER — Other Ambulatory Visit: Payer: Self-pay

## 2020-01-07 DIAGNOSIS — F330 Major depressive disorder, recurrent, mild: Secondary | ICD-10-CM

## 2020-01-10 ENCOUNTER — Ambulatory Visit (INDEPENDENT_AMBULATORY_CARE_PROVIDER_SITE_OTHER): Payer: 59 | Admitting: Professional

## 2020-01-10 DIAGNOSIS — F411 Generalized anxiety disorder: Secondary | ICD-10-CM

## 2020-01-10 DIAGNOSIS — F331 Major depressive disorder, recurrent, moderate: Secondary | ICD-10-CM

## 2020-01-14 ENCOUNTER — Other Ambulatory Visit: Payer: Self-pay

## 2020-01-14 ENCOUNTER — Encounter (INDEPENDENT_AMBULATORY_CARE_PROVIDER_SITE_OTHER): Payer: Self-pay | Admitting: Physician Assistant

## 2020-01-14 ENCOUNTER — Ambulatory Visit (INDEPENDENT_AMBULATORY_CARE_PROVIDER_SITE_OTHER): Payer: 59 | Admitting: Physician Assistant

## 2020-01-14 VITALS — BP 131/79 | HR 89 | Temp 97.9°F | Ht 62.0 in | Wt 243.0 lb

## 2020-01-14 DIAGNOSIS — E039 Hypothyroidism, unspecified: Secondary | ICD-10-CM

## 2020-01-14 DIAGNOSIS — Z6841 Body Mass Index (BMI) 40.0 and over, adult: Secondary | ICD-10-CM

## 2020-01-14 NOTE — Progress Notes (Signed)
     Chief Complaint:   OBESITY Melissa [name] is here to discuss her progress with her obesity treatment plan along with follow-up of her obesity related diagnoses. Melissa [name] is on the Category 2 Plan and states she is following her eating plan approximately 96% of the time. Melissa [name] states she is walking for 20 minutes 4 times per week.  Today's visit was #: 5 Starting weight: 256 lbs Starting date: 11/18/2019 Today's weight: 243 lbs Today's date: 01/14/2020 Total lbs lost to date: 13 lbs Total lbs lost since last in-office visit: 3 lbs  Interim History: Melissa [name] reports that she has been following the plan well, except when she recently indulged in some Peanut M&Ms.  Subjective:   1. Acquired hypothyroidism Melissa [name] is taking levothyroxine.  She has no excessive fatigue or heat/cold intolerance.   Assessment/Plan:   1. Acquired hypothyroidism Patient with long-standing hypothyroidism, on levothyroxine therapy. She appears euthyroid. Orders and follow up as documented in patient record.  Counseling . Good thyroid control is important for overall health. Supratherapeutic thyroid levels are dangerous and will not improve weight loss results. . The correct way to take levothyroxine is fasting, with water, separated by at least 30 minutes from breakfast, and separated by more than 4 hours from calcium, iron, multivitamins, acid reflux medications (PPIs).   2. Class 3 severe obesity with serious comorbidity and body mass index (BMI) of 40.0 to 44.9 in adult, unspecified obesity type (HCC) Melissa [name] is currently in the action stage of change. As such, her goal is to continue with weight loss efforts. She has agreed to the Category 2 Plan.   Exercise goals: As is.  Behavioral modification strategies: decreasing simple carbohydrates and meal planning and cooking strategies.  Melissa [name] has agreed to follow-up with our clinic in 2 weeks. She was informed of the importance of frequent follow-up visits to  maximize her success with intensive lifestyle modifications for her multiple health conditions.   Objective:   Blood pressure 131/79, pulse 89, temperature 97.9 F (36.6 C), temperature source Oral, height 5\' 2"  (1.575 m), weight 243 lb (110.2 kg), SpO2 99 %. Body mass index is 44.45 kg/m.  General: Cooperative, alert, well developed, in no acute distress. HEENT: Conjunctivae and lids unremarkable. Cardiovascular: Regular rhythm.  Lungs: Normal work of breathing. Neurologic: No focal deficits.   Lab Results  Component Value Date   CREATININE 0.82 04/11/2015   BUN 10 04/11/2015   NA 139 04/11/2015   K 4.0 04/11/2015   CL 107 04/11/2015   CO2 24 04/11/2015   Lab Results  Component Value Date   ALT 17 04/11/2015   AST 19 04/11/2015   ALKPHOS 81 04/11/2015   BILITOT 0.2 (L) 04/11/2015   Lab Results  Component Value Date   INSULIN 23.2 11/18/2019   Lab Results  Component Value Date   WBC 8.7 04/11/2015   HGB 12.3 04/11/2015   HCT 38.1 04/11/2015   MCV 86.8 04/11/2015   PLT 371 04/11/2015   Attestation Statements:   Reviewed by clinician on day of visit: allergies, medications, problem list, medical history, surgical history, family history, social history, and previous encounter notes.  Time spent on visit including pre-visit chart review and post-visit care and charting was 32 minutes.   I, 04/13/2015, CMA, am acting as Insurance claims handler for Energy manager, PA-C.  I have reviewed the above documentation for accuracy and completeness, and I agree with the above. [employer], PA-C

## 2020-01-16 ENCOUNTER — Ambulatory Visit (INDEPENDENT_AMBULATORY_CARE_PROVIDER_SITE_OTHER): Payer: 59 | Admitting: Professional

## 2020-01-16 ENCOUNTER — Ambulatory Visit: Payer: Self-pay | Attending: Internal Medicine

## 2020-01-16 DIAGNOSIS — F411 Generalized anxiety disorder: Secondary | ICD-10-CM | POA: Diagnosis not present

## 2020-01-16 DIAGNOSIS — F331 Major depressive disorder, recurrent, moderate: Secondary | ICD-10-CM | POA: Diagnosis not present

## 2020-01-16 DIAGNOSIS — Z23 Encounter for immunization: Secondary | ICD-10-CM

## 2020-01-16 NOTE — Progress Notes (Signed)
   Covid-19 Vaccination Clinic  Name:  DREW HERMAN    MRN: [id number] DOB: 1966-09-11  01/16/2020  Ms. Shoun was observed post Covid-19 immunization for 15 minutes without incident. She was provided with Vaccine Information Sheet and instruction to access the V-Safe system.   Ms. Fite was instructed to call 911 with any severe reactions post vaccine: Marland Kitchen Difficulty breathing  . Swelling of face and throat  . A fast heartbeat  . A bad rash all over body  . Dizziness and weakness   Immunizations Administered    Name Date Dose VIS Date Route   Moderna COVID-19 Vaccine 01/16/2020 10:01 AM 0.5 mL 09/24/2019 Intramuscular   Manufacturer: Moderna   Lot: [id number]   NDC: 09326-712-45

## 2020-01-20 ENCOUNTER — Ambulatory Visit (INDEPENDENT_AMBULATORY_CARE_PROVIDER_SITE_OTHER): Payer: 59 | Admitting: Professional

## 2020-01-20 DIAGNOSIS — F411 Generalized anxiety disorder: Secondary | ICD-10-CM

## 2020-01-20 DIAGNOSIS — F331 Major depressive disorder, recurrent, moderate: Secondary | ICD-10-CM | POA: Diagnosis not present

## 2020-01-28 ENCOUNTER — Ambulatory Visit (INDEPENDENT_AMBULATORY_CARE_PROVIDER_SITE_OTHER): Payer: 59 | Admitting: Physician Assistant

## 2020-02-03 ENCOUNTER — Ambulatory Visit (INDEPENDENT_AMBULATORY_CARE_PROVIDER_SITE_OTHER): Payer: 59 | Admitting: Professional

## 2020-02-03 DIAGNOSIS — F411 Generalized anxiety disorder: Secondary | ICD-10-CM | POA: Diagnosis not present

## 2020-02-03 DIAGNOSIS — F331 Major depressive disorder, recurrent, moderate: Secondary | ICD-10-CM | POA: Diagnosis not present

## 2020-02-11 ENCOUNTER — Ambulatory Visit (INDEPENDENT_AMBULATORY_CARE_PROVIDER_SITE_OTHER): Payer: 59 | Admitting: Physician Assistant

## 2020-02-11 ENCOUNTER — Ambulatory Visit (INDEPENDENT_AMBULATORY_CARE_PROVIDER_SITE_OTHER): Payer: 59 | Admitting: Professional

## 2020-02-11 ENCOUNTER — Other Ambulatory Visit: Payer: Self-pay

## 2020-02-11 ENCOUNTER — Encounter (INDEPENDENT_AMBULATORY_CARE_PROVIDER_SITE_OTHER): Payer: Self-pay | Admitting: Physician Assistant

## 2020-02-11 VITALS — BP 118/73 | HR 79 | Temp 98.3°F | Ht 62.0 in | Wt 237.0 lb

## 2020-02-11 DIAGNOSIS — Z9189 Other specified personal risk factors, not elsewhere classified: Secondary | ICD-10-CM

## 2020-02-11 DIAGNOSIS — F331 Major depressive disorder, recurrent, moderate: Secondary | ICD-10-CM | POA: Diagnosis not present

## 2020-02-11 DIAGNOSIS — F411 Generalized anxiety disorder: Secondary | ICD-10-CM

## 2020-02-11 DIAGNOSIS — E7849 Other hyperlipidemia: Secondary | ICD-10-CM

## 2020-02-11 DIAGNOSIS — E66813 Obesity, class 3: Secondary | ICD-10-CM

## 2020-02-11 DIAGNOSIS — K5909 Other constipation: Secondary | ICD-10-CM | POA: Diagnosis not present

## 2020-02-11 DIAGNOSIS — Z6841 Body Mass Index (BMI) 40.0 and over, adult: Secondary | ICD-10-CM

## 2020-02-12 NOTE — Progress Notes (Signed)
Chief Complaint:   OBESITY Melissa [name] is here to discuss her progress with her obesity treatment plan along with follow-up of her obesity related diagnoses. [name] is on the Category 2 Plan and states she is following her eating plan approximately 90% of the time. [name] states she is exercising 0 minutes 0 times per week.  Today's visit was #: 6 Starting weight: 256 lbs Starting date: 11/18/2019 Today's weight: 237 lbs Today's date: 02/11/2020 Total lbs lost to date: 19 Total lbs lost since last in-office visit: 6  Interim History: [name] has done very well with the plan. She portion controlled things that were not on the plan.  Subjective:   Other constipation. [name] has been diagnosed with IBS-C. No blood in stool or black stools.  Other hyperlipidemia. [name] is on pravastatin.  No chest pain or headache.   No results found for: CHOL, HDL, LDLCALC, LDLDIRECT, TRIG, CHOLHDL Lab Results  Component Value Date   ALT 17 04/11/2015   AST 19 04/11/2015   ALKPHOS 81 04/11/2015   BILITOT 0.2 (L) 04/11/2015   The ASCVD Risk score Mikey Bussing DC Jr., et al., 2013) failed to calculate for the following reasons:   Cannot find a previous HDL lab  At risk for heart disease. [name] is at a higher than average risk for cardiovascular disease due to obesity.   Assessment/Plan:   Other constipation. [name] was informed that a decrease in bowel movement frequency is normal while losing weight, but stools should not be hard or painful. Orders and follow up as documented in patient record.  She will start metamucil OTC and will take MiraLax as needed.  Counseling Getting to Good Bowel Health: Your goal is to have one soft bowel movement each day. Drink at least 8 glasses of water each day. Eat plenty of fiber (goal is over 25 grams each day). It is best to get most of your fiber from dietary sources which includes leafy green vegetables, fresh fruit, and whole grains. You may need to add  fiber with the help of OTC fiber supplements. These include Metamucil, Citrucel, and Flaxseed. If you are still having trouble, try adding Miralax or Magnesium Citrate. If all of these changes do not work, [place] [employer].  Other hyperlipidemia. Cardiovascular risk and specific lipid/LDL goals reviewed.  We discussed several lifestyle modifications today and [name] will continue to work on diet, exercise and weight loss efforts. Orders and follow up as documented in patient record. [name] will continue her medication as directed.  Counseling Intensive lifestyle modifications are the first line treatment for this issue. . Dietary changes: Increase soluble fiber. Decrease simple carbohydrates. . Exercise changes: Moderate to vigorous-intensity aerobic activity 150 minutes per week if tolerated. . Lipid-lowering medications: see documented in medical record.  At risk for heart disease. [name] was given approximately 15 minutes of coronary artery disease prevention counseling today. She is 54 y.o. female and has risk factors for heart disease including obesity. We discussed intensive lifestyle modifications today with an emphasis on specific weight loss instructions and strategies.   Repetitive spaced learning was employed today to elicit superior memory formation and behavioral change.  Class 3 severe obesity with serious comorbidity and body mass index (BMI) of 40.0 to 44.9 in adult, unspecified obesity type ([place]).  [name] is currently in the action stage of change. As such, her goal is to continue with weight loss efforts. She has agreed to the Category 2 Plan.   Exercise goals: For  substantial health benefits, adults should do at least 150 minutes (2 hours and 30 minutes) a week of moderate-intensity, or 75 minutes (1 hour and 15 minutes) a week of vigorous-intensity aerobic physical activity, or an equivalent combination of moderate- and vigorous-intensity aerobic activity. Aerobic activity  should be performed in episodes of at least 10 minutes, and preferably, it should be spread throughout the week.  Behavioral modification strategies: meal planning and cooking strategies and keeping healthy foods in the home.  [name] has agreed to follow-up with our clinic in 2 weeks. She was informed of the importance of frequent follow-up visits to maximize her success with intensive lifestyle modifications for her multiple health conditions.   Objective:   Blood pressure 118/73, pulse 79, temperature 98.3 F (36.8 C), temperature source Oral, height 5\' 2"  (1.575 m), weight 237 lb (107.5 kg), SpO2 97 %. Body mass index is 43.35 kg/m.  General: Cooperative, alert, well developed, in no acute distress. HEENT: Conjunctivae and lids unremarkable. Cardiovascular: Regular rhythm.  Lungs: Normal work of breathing. Neurologic: No focal deficits.   Lab Results  Component Value Date   CREATININE 0.82 04/11/2015   BUN 10 04/11/2015   NA 139 04/11/2015   K 4.0 04/11/2015   CL 107 04/11/2015   CO2 24 04/11/2015   Lab Results  Component Value Date   ALT 17 04/11/2015   AST 19 04/11/2015   ALKPHOS 81 04/11/2015   BILITOT 0.2 (L) 04/11/2015   No results found for: HGBA1C Lab Results  Component Value Date   INSULIN 23.2 11/18/2019   No results found for: TSH No results found for: CHOL, HDL, LDLCALC, LDLDIRECT, TRIG, CHOLHDL Lab Results  Component Value Date   WBC 8.7 04/11/2015   HGB 12.3 04/11/2015   HCT 38.1 04/11/2015   MCV 86.8 04/11/2015   PLT 371 04/11/2015   No results found for: IRON, TIBC, FERRITIN  Attestation Statements:   Reviewed by clinician on day of visit: allergies, medications, problem list, medical history, surgical history, family history, social history, and previous encounter notes.  I06/20/2016, am acting as transcriptionist for Marianna Payment, PA-C   I have reviewed the above documentation for accuracy and completeness, and I agree with the above.  [name], PA-C

## 2020-02-18 ENCOUNTER — Ambulatory Visit: Payer: Self-pay | Attending: Internal Medicine

## 2020-02-18 DIAGNOSIS — Z23 Encounter for immunization: Secondary | ICD-10-CM

## 2020-02-18 NOTE — Progress Notes (Signed)
   Covid-19 Vaccination Clinic  Name:  Melissa [name]    MRN: [id number] DOB: [birth year]  02/18/2020  [name] was observed post Covid-19 immunization for 15 minutes without incident. She was provided with Vaccine Information Sheet and instruction to access the V-Safe system.   [name] was instructed to call 911 with any severe reactions post vaccine: Marland Kitchen Difficulty breathing  . Swelling of face and throat  . A fast heartbeat  . A bad rash all over body  . Dizziness and weakness   Immunizations Administered    Name Date Dose VIS Date Route   Moderna COVID-19 Vaccine 02/18/2020  9:35 AM 0.5 mL 09/2019 Intramuscular   Manufacturer: Moderna   Lot: [id number]   NDC: 54270-623-76

## 2020-02-21 ENCOUNTER — Ambulatory Visit (INDEPENDENT_AMBULATORY_CARE_PROVIDER_SITE_OTHER): Payer: 59 | Admitting: Professional

## 2020-02-21 DIAGNOSIS — F411 Generalized anxiety disorder: Secondary | ICD-10-CM

## 2020-02-21 DIAGNOSIS — F331 Major depressive disorder, recurrent, moderate: Secondary | ICD-10-CM

## 2020-02-22 ENCOUNTER — Ambulatory Visit: Payer: 59 | Admitting: Professional

## 2020-02-25 ENCOUNTER — Other Ambulatory Visit: Payer: Self-pay

## 2020-02-25 ENCOUNTER — Encounter (INDEPENDENT_AMBULATORY_CARE_PROVIDER_SITE_OTHER): Payer: Self-pay | Admitting: Physician Assistant

## 2020-02-25 ENCOUNTER — Ambulatory Visit (INDEPENDENT_AMBULATORY_CARE_PROVIDER_SITE_OTHER): Payer: 59 | Admitting: Physician Assistant

## 2020-02-25 VITALS — BP 108/71 | HR 70 | Temp 98.3°F | Ht 62.0 in | Wt 236.0 lb

## 2020-02-25 DIAGNOSIS — E7849 Other hyperlipidemia: Secondary | ICD-10-CM | POA: Diagnosis not present

## 2020-02-25 DIAGNOSIS — E8881 Metabolic syndrome: Secondary | ICD-10-CM

## 2020-02-25 DIAGNOSIS — Z6841 Body Mass Index (BMI) 40.0 and over, adult: Secondary | ICD-10-CM

## 2020-02-25 NOTE — Progress Notes (Signed)
Chief Complaint:   Melissa [name] is here to discuss her progress with her Melissa treatment plan along with follow-up of her Melissa related diagnoses. Melissa [name] is on the Category 2 Plan and states she is following her eating plan approximately 90% of the time. Melissa [name] states she is remodeling her living room.   Today's visit was #: 7 Starting weight: 256 lbs Starting date: 11/18/2019 Today's weight: 236 lbs Today's date: 02/25/2020 Total lbs lost to date: 20 Total lbs lost since last in-office visit: 1  Interim History: Melissa [name] reports that she is not weighing her protein consistently. She states she likes to eat a big breakfast and a lighter lunch and dinner.  Subjective:   Insulin resistance. Melissa [name] has a diagnosis of insulin resistance based on her elevated fasting insulin level >5. She continues to work on diet and exercise to decrease her risk of diabetes. Melissa [name] is on no medication. No polyphagia.  Lab Results  Component Value Date   INSULIN 23.2 11/18/2019   No results found for: HGBA1C  Other hyperlipidemia. Melissa [name] is on pravastatin. No chest pain or myalgias.  No results found for: CHOL, HDL, LDLCALC, LDLDIRECT, TRIG, CHOLHDL Lab Results  Component Value Date   ALT 17 04/11/2015   AST 19 04/11/2015   ALKPHOS 81 04/11/2015   BILITOT 0.2 (L) 04/11/2015   The ASCVD Risk score Denman George DC Jr., et al., 2013) failed to calculate for the following reasons:   Cannot find a previous HDL lab  Assessment/Plan:   Insulin resistance. Melissa [name] will continue to work on weight loss, exercise, and decreasing simple carbohydrates to help decrease the risk of diabetes. Female agreed to follow-up with [name] as directed to closely monitor her progress.  Other hyperlipidemia. Cardiovascular risk and specific lipid/LDL goals reviewed.  We discussed several lifestyle modifications today and Melissa [name] will continue to work on diet, exercise and weight loss efforts. Orders and follow up  as documented in patient record. She will continue her medication as directed.  Counseling Intensive lifestyle modifications are the first line treatment for this issue. . Dietary changes: Increase soluble fiber. Decrease simple carbohydrates. . Exercise changes: Moderate to vigorous-intensity aerobic activity 150 minutes per week if tolerated. . Lipid-lowering medications: see documented in medical record.  Class 3 severe Melissa with serious comorbidity and body mass index (BMI) of 40.0 to 44.9 in adult, unspecified Melissa type (HCC).  Melissa [name] is currently in the action stage of change. As such, her goal is to continue with weight loss efforts. She has agreed to the Category 2 Plan.   Exercise goals: For substantial health benefits, adults should do at least 150 minutes (2 hours and 30 minutes) a week of moderate-intensity, or 75 minutes (1 hour and 15 minutes) a week of vigorous-intensity aerobic physical activity, or an equivalent combination of moderate- and vigorous-intensity aerobic activity. Aerobic activity should be performed in episodes of at least 10 minutes, and preferably, it should be spread throughout the week.  Behavioral modification strategies: meal planning and cooking strategies and keeping healthy foods in the home.  Melissa [name] has agreed to follow-up with our clinic in 2-3 weeks. She was informed of the importance of frequent follow-up visits to maximize her success with intensive lifestyle modifications for her multiple health conditions.   Objective:   Blood pressure 108/71, pulse 70, temperature 98.3 F (36.8 C), temperature source Oral, height 5\' 2"  (1.575 m), weight 236 lb (107 kg), SpO2 97 %. Body mass index is 43.16 kg/m.  General: Cooperative, alert, well developed, in no acute distress. HEENT: Conjunctivae and lids unremarkable. Cardiovascular: Regular rhythm.  Lungs: Normal work of breathing. Neurologic: No focal deficits.   Lab Results  Component Value  Date   CREATININE 0.82 04/11/2015   BUN 10 04/11/2015   NA 139 04/11/2015   K 4.0 04/11/2015   CL 107 04/11/2015   CO2 24 04/11/2015   Lab Results  Component Value Date   ALT 17 04/11/2015   AST 19 04/11/2015   ALKPHOS 81 04/11/2015   BILITOT 0.2 (L) 04/11/2015   No results found for: HGBA1C Lab Results  Component Value Date   INSULIN 23.2 11/18/2019   No results found for: TSH No results found for: CHOL, HDL, LDLCALC, LDLDIRECT, TRIG, CHOLHDL Lab Results  Component Value Date   WBC 8.7 04/11/2015   HGB 12.3 04/11/2015   HCT 38.1 04/11/2015   MCV 86.8 04/11/2015   PLT 371 04/11/2015   No results found for: IRON, TIBC, FERRITIN  Attestation Statements:   Reviewed by clinician on day of visit: allergies, medications, problem list, medical history, surgical history, family history, social history, and previous encounter notes.  Time spent on visit including pre-visit chart review and post-visit charting and care was 30 minutes.   IMichaelene Song, am acting as transcriptionist for Abby Potash, PA-C   I have reviewed the above documentation for accuracy and completeness, and I agree with the above. Abby Potash, PA-C

## 2020-03-05 ENCOUNTER — Ambulatory Visit (INDEPENDENT_AMBULATORY_CARE_PROVIDER_SITE_OTHER): Payer: 59 | Admitting: Professional

## 2020-03-05 DIAGNOSIS — F331 Major depressive disorder, recurrent, moderate: Secondary | ICD-10-CM

## 2020-03-05 DIAGNOSIS — F411 Generalized anxiety disorder: Secondary | ICD-10-CM | POA: Diagnosis not present

## 2020-03-10 ENCOUNTER — Ambulatory Visit (INDEPENDENT_AMBULATORY_CARE_PROVIDER_SITE_OTHER): Payer: 59 | Admitting: Physician Assistant

## 2020-03-10 ENCOUNTER — Other Ambulatory Visit: Payer: Self-pay

## 2020-03-10 ENCOUNTER — Encounter (INDEPENDENT_AMBULATORY_CARE_PROVIDER_SITE_OTHER): Payer: Self-pay | Admitting: Physician Assistant

## 2020-03-10 VITALS — BP 123/77 | HR 82 | Temp 98.1°F | Ht 62.0 in | Wt 232.0 lb

## 2020-03-10 DIAGNOSIS — E8881 Metabolic syndrome: Secondary | ICD-10-CM

## 2020-03-10 DIAGNOSIS — Z6841 Body Mass Index (BMI) 40.0 and over, adult: Secondary | ICD-10-CM

## 2020-03-10 NOTE — Progress Notes (Signed)
Chief Complaint:   OBESITY Melissa [name] is here to discuss her progress with her obesity treatment plan along with follow-up of her obesity related diagnoses. Melissa [name] is on the Category 2 Plan and states she is following her eating plan approximately 93% of the time. Melissa [name] states she is increasing her activity and remodeling her living room.   Today's visit was #: 8 Starting weight: 256 lbs Starting date: 11/18/2019 Today's weight: 232 lbs Today's date: 03/10/2020 Total lbs lost to date: 24 Total lbs lost since last in-office visit: 4  Interim History: Melissa [name] reports that she is doing well with the plan. She has been eating more food earlier in the day and eating a lighter dinner. She notes some lactose intolerance.  Subjective:   Insulin resistance. Melissa [name] has a diagnosis of insulin resistance based on her elevated fasting insulin level >5. She continues to work on diet and exercise to decrease her risk of diabetes. She is on no medication. No polyphagia.  Lab Results  Component Value Date   INSULIN 23.2 11/18/2019   No results found for: HGBA1C  Assessment/Plan:   Insulin resistance. Melissa [name] will continue to work on weight loss, exercise, and decreasing simple carbohydrates to help decrease the risk of diabetes. Melissa [name] agreed to follow-up with [name] as directed to closely monitor her progress.  Class 3 severe obesity with serious comorbidity and body mass index (BMI) of 40.0 to 44.9 in adult, unspecified obesity type ([place]).  Melissa [name] is currently in the action stage of change. As such, her goal is to continue with weight loss efforts. She has agreed to the Category 2 Plan.   Exercise goals: For substantial health benefits, adults should do at least 150 minutes (2 hours and 30 minutes) a week of moderate-intensity, or 75 minutes (1 hour and 15 minutes) a week of vigorous-intensity aerobic physical activity, or an equivalent combination of moderate- and vigorous-intensity  aerobic activity. Aerobic activity should be performed in episodes of at least 10 minutes, and preferably, it should be spread throughout the week.  Behavioral modification strategies: meal planning and cooking strategies and keeping healthy foods in the home.  Melissa [name] has agreed to follow-up with our clinic in 2 weeks. She was informed of the importance of frequent follow-up visits to maximize her success with intensive lifestyle modifications for her multiple health conditions.   Objective:   Blood pressure 123/77, pulse 82, temperature 98.1 F (36.7 C), temperature source Oral, height 5\' 2"  (1.575 m), weight 232 lb (105.2 kg), SpO2 98 %. Body mass index is 42.43 kg/m.  General: Cooperative, alert, well developed, in no acute distress. HEENT: Conjunctivae and lids unremarkable. Cardiovascular: Regular rhythm.  Lungs: Normal work of breathing. Neurologic: No focal deficits.   Lab Results  Component Value Date   CREATININE 0.82 04/11/2015   BUN 10 04/11/2015   NA 139 04/11/2015   K 4.0 04/11/2015   CL 107 04/11/2015   CO2 24 04/11/2015   Lab Results  Component Value Date   ALT 17 04/11/2015   AST 19 04/11/2015   ALKPHOS 81 04/11/2015   BILITOT 0.2 (L) 04/11/2015   No results found for: HGBA1C Lab Results  Component Value Date   INSULIN 23.2 11/18/2019   No results found for: TSH No results found for: CHOL, HDL, LDLCALC, LDLDIRECT, TRIG, CHOLHDL Lab Results  Component Value Date   WBC 8.7 04/11/2015   HGB 12.3 04/11/2015   HCT 38.1 04/11/2015   MCV 86.8 04/11/2015   PLT  371 04/11/2015   No results found for: IRON, TIBC, FERRITIN  Attestation Statements:   Reviewed by clinician on day of visit: allergies, medications, problem list, medical history, surgical history, family history, social history, and previous encounter notes.  Time spent on visit including pre-visit chart review and post-visit charting and care was 30 minutes.   [name] Payment, am acting as  transcriptionist for [name], PA-C   I have reviewed the above documentation for accuracy and completeness, and I agree with the above. [name], PA-C

## 2020-03-19 ENCOUNTER — Ambulatory Visit (INDEPENDENT_AMBULATORY_CARE_PROVIDER_SITE_OTHER): Payer: 59 | Admitting: Professional

## 2020-03-19 DIAGNOSIS — F411 Generalized anxiety disorder: Secondary | ICD-10-CM | POA: Diagnosis not present

## 2020-03-19 DIAGNOSIS — F331 Major depressive disorder, recurrent, moderate: Secondary | ICD-10-CM | POA: Diagnosis not present

## 2020-03-31 ENCOUNTER — Ambulatory Visit (INDEPENDENT_AMBULATORY_CARE_PROVIDER_SITE_OTHER): Payer: 59 | Admitting: Physician Assistant

## 2020-03-31 ENCOUNTER — Encounter (INDEPENDENT_AMBULATORY_CARE_PROVIDER_SITE_OTHER): Payer: Self-pay | Admitting: Physician Assistant

## 2020-03-31 ENCOUNTER — Other Ambulatory Visit: Payer: Self-pay

## 2020-03-31 VITALS — BP 107/71 | HR 73 | Temp 97.7°F | Ht 62.0 in | Wt 228.0 lb

## 2020-03-31 DIAGNOSIS — E8881 Metabolic syndrome: Secondary | ICD-10-CM | POA: Diagnosis not present

## 2020-03-31 DIAGNOSIS — E7849 Other hyperlipidemia: Secondary | ICD-10-CM

## 2020-03-31 DIAGNOSIS — E88819 Insulin resistance, unspecified: Secondary | ICD-10-CM

## 2020-03-31 DIAGNOSIS — Z6841 Body Mass Index (BMI) 40.0 and over, adult: Secondary | ICD-10-CM | POA: Diagnosis not present

## 2020-03-31 NOTE — Progress Notes (Signed)
Chief Complaint:   OBESITY KAMERIN AXFORD is here to discuss her progress with her obesity treatment plan along with follow-up of her obesity related diagnoses. Kewana is on the Category 2 Plan and states she is following her eating plan approximately 95% of the time. Gloristine states she is walking 15-20 minutes 7 times per week.  Today's visit was #: 9 Starting weight: 256 lbs Starting date: 11/18/2019 Today's weight: 228 lbs Today's date: 03/31/2020 Total lbs lost to date: 28 Total lbs lost since last in-office visit: 4  Interim History: Ranee reports that she has not had any issues with the plan. She is eating more food early in the day and less at night.  Subjective:   Insulin resistance. Shanyce has a diagnosis of insulin resistance based on her elevated fasting insulin level >5. She continues to work on diet and exercise to decrease her risk of diabetes. Jamilette is on no medication. No polyphagia. She is exercising regularly.  Lab Results  Component Value Date   INSULIN 23.2 11/18/2019   No results found for: HGBA1C  Other hyperlipidemia. Maeson is on pravastatin. No chest pain or myalgias.   No results found for: CHOL, HDL, LDLCALC, LDLDIRECT, TRIG, CHOLHDL Lab Results  Component Value Date   ALT 17 04/11/2015   AST 19 04/11/2015   ALKPHOS 81 04/11/2015   BILITOT 0.2 (L) 04/11/2015   The ASCVD Risk score Denman George DC Jr., et al., 2013) failed to calculate for the following reasons:   Cannot find a previous HDL lab  Assessment/Plan:   Insulin resistance. Maysa will continue to work on weight loss, exercise, and decreasing simple carbohydrates to help decrease the risk of diabetes. Jeffery agreed to follow-up with [name] as directed to closely monitor her progress.  Other hyperlipidemia. Cardiovascular risk and specific lipid/LDL goals reviewed.  We discussed several lifestyle modifications today and Ariyonna will continue to work on diet, exercise and weight loss efforts.  Orders and follow up as documented in patient record. She will continue her medication as directed.   Counseling Intensive lifestyle modifications are the first line treatment for this issue. . Dietary changes: Increase soluble fiber. Decrease simple carbohydrates. . Exercise changes: Moderate to vigorous-intensity aerobic activity 150 minutes per week if tolerated. . Lipid-lowering medications: see documented in medical record.  Class 3 severe obesity with serious comorbidity and body mass index (BMI) of 40.0 to 44.9 in adult, unspecified obesity type (HCC).  Roxann is currently in the action stage of change. As such, her goal is to continue with weight loss efforts. She has agreed to the Category 2 Plan.   Exercise goals: For substantial health benefits, adults should do at least 150 minutes (2 hours and 30 minutes) a week of moderate-intensity, or 75 minutes (1 hour and 15 minutes) a week of vigorous-intensity aerobic physical activity, or an equivalent combination of moderate- and vigorous-intensity aerobic activity. Aerobic activity should be performed in episodes of at least 10 minutes, and preferably, it should be spread throughout the week.  Behavioral modification strategies: avoiding temptations and planning for success.  Verneal has agreed to follow-up with our clinic in 2-3 weeks. She was informed of the importance of frequent follow-up visits to maximize her success with intensive lifestyle modifications for her multiple health conditions.   Objective:   Blood pressure 107/71, pulse 73, temperature 97.7 F (36.5 C), temperature source Oral, height 5\' 2"  (1.575 m), weight 228 lb (103.4 kg), SpO2 99 %. Body mass index is 41.7  kg/m.  General: Cooperative, alert, well developed, in no acute distress. HEENT: Conjunctivae and lids unremarkable. Cardiovascular: Regular rhythm.  Lungs: Normal work of breathing. Neurologic: No focal deficits.   Lab Results  Component Value Date    CREATININE 0.82 04/11/2015   BUN 10 04/11/2015   NA 139 04/11/2015   K 4.0 04/11/2015   CL 107 04/11/2015   CO2 24 04/11/2015   Lab Results  Component Value Date   ALT 17 04/11/2015   AST 19 04/11/2015   ALKPHOS 81 04/11/2015   BILITOT 0.2 (L) 04/11/2015   No results found for: HGBA1C Lab Results  Component Value Date   INSULIN 23.2 11/18/2019   No results found for: TSH No results found for: CHOL, HDL, LDLCALC, LDLDIRECT, TRIG, CHOLHDL Lab Results  Component Value Date   WBC 8.7 04/11/2015   HGB 12.3 04/11/2015   HCT 38.1 04/11/2015   MCV 86.8 04/11/2015   PLT 371 04/11/2015   No results found for: IRON, TIBC, FERRITIN  Attestation Statements:   Reviewed by clinician on day of visit: allergies, medications, problem list, medical history, surgical history, family history, social history, and previous encounter notes.  Time spent on visit including pre-visit chart review and post-visit charting and care was 31 minutes.   IMichaelene Song, am acting as transcriptionist for Abby Potash, PA-C   I have reviewed the above documentation for accuracy and completeness, and I agree with the above. Abby Potash, PA-C

## 2020-04-02 ENCOUNTER — Ambulatory Visit: Payer: 59 | Admitting: Professional

## 2020-04-13 ENCOUNTER — Ambulatory Visit (INDEPENDENT_AMBULATORY_CARE_PROVIDER_SITE_OTHER): Payer: 59 | Admitting: Professional

## 2020-04-13 DIAGNOSIS — F411 Generalized anxiety disorder: Secondary | ICD-10-CM | POA: Diagnosis not present

## 2020-04-13 DIAGNOSIS — F331 Major depressive disorder, recurrent, moderate: Secondary | ICD-10-CM

## 2020-04-21 ENCOUNTER — Encounter (INDEPENDENT_AMBULATORY_CARE_PROVIDER_SITE_OTHER): Payer: Self-pay | Admitting: Physician Assistant

## 2020-04-21 ENCOUNTER — Ambulatory Visit (INDEPENDENT_AMBULATORY_CARE_PROVIDER_SITE_OTHER): Payer: 59 | Admitting: Psychology

## 2020-04-21 ENCOUNTER — Ambulatory Visit (INDEPENDENT_AMBULATORY_CARE_PROVIDER_SITE_OTHER): Payer: 59 | Admitting: Physician Assistant

## 2020-04-21 ENCOUNTER — Other Ambulatory Visit: Payer: Self-pay

## 2020-04-21 VITALS — BP 107/71 | HR 70 | Temp 97.8°F | Ht 62.0 in | Wt 227.0 lb

## 2020-04-21 DIAGNOSIS — E8881 Metabolic syndrome: Secondary | ICD-10-CM | POA: Diagnosis not present

## 2020-04-21 DIAGNOSIS — K5909 Other constipation: Secondary | ICD-10-CM | POA: Diagnosis not present

## 2020-04-21 DIAGNOSIS — Z6841 Body Mass Index (BMI) 40.0 and over, adult: Secondary | ICD-10-CM | POA: Diagnosis not present

## 2020-04-21 NOTE — Progress Notes (Signed)
Chief Complaint:   OBESITY Melissa [name] is here to discuss her progress with her obesity treatment plan along with follow-up of her obesity related diagnoses. Melissa [name] is on the Category 2 Plan and states she is following her eating plan approximately 80% of the time. Melissa [name] states she is walking 6,000 steps daily.   Today's visit was #: 10 Starting weight: 256 lbs Starting date: 11/18/2019 Today's weight: 227 lbs Today's date: 04/21/2020 Total lbs lost to date: 29 Total lbs lost since last in-office visit: 1  Interim History: Melissa [name] notes increased hunger throughout the day recently. 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  Subjective:   Insulin resistance. Melissa [name] has a diagnosis of insulin resistance based on her elevated fasting insulin level >5. She continues to work on diet and exercise to decrease her risk of diabetes. Melissa [name] is on no medications currently. She does report polyphagia. Labs were drawn last month at her PCP's office.   Lab Results  Component Value Date   INSULIN 23.2 11/18/2019   No results found for: HGBA1C  Other constipation. Terresa is taking metamucil and aloe. No blood in stool or hematochezia.  Assessment/Plan:   Insulin resistance. Melissa [name] will continue to work on weight loss, exercise, and decreasing simple carbohydrates to help decrease the risk of diabetes. Melissa [name] agreed to follow-up with [name] as directed to closely monitor her progress.  Other constipation. Melissa [name] was informed that a decrease in bowel movement frequency is normal while losing weight, but stools should not be hard or painful. Orders and follow up as documented in patient record. She will continue her medications and continue to increase her water intake.  Counseling Getting to Good Bowel Health: Your goal is to have one soft bowel movement each day. Drink at least 8 glasses of water each day. Eat plenty of fiber (goal is over 25 grams each day). It is best to get most  of your fiber from dietary sources which includes leafy green vegetables, fresh fruit, and whole grains. You may need to add fiber with the help of OTC fiber supplements. These include Metamucil, Citrucel, and Flaxseed. If you are still having trouble, try adding Miralax or Magnesium Citrate. If all of these changes do not work, Dietitian.  Class 3 severe obesity with serious comorbidity and body mass index (BMI) of 40.0 to 44.9 in adult, unspecified obesity type (HCC).  [name] is currently in the action stage of change. As such, her goal is to continue with weight loss efforts. She has agreed to the Category 2 Plan + 200 protein calories.   Exercise goals: For substantial health benefits, adults should do at least 150 minutes (2 hours and 30 minutes) a week of moderate-intensity, or 75 minutes (1 hour and 15 minutes) a week of vigorous-intensity aerobic physical activity, or an equivalent combination of moderate- and vigorous-intensity aerobic activity. Aerobic activity should be performed in episodes of at least 10 minutes, and preferably, it should be spread throughout the week.  Behavioral modification strategies: increasing lean protein intake and meal planning and cooking strategies.  Melissa [name] has agreed to follow-up with our clinic in 3 weeks. She was informed of the importance of frequent follow-up visits to maximize her success with intensive lifestyle modifications for her multiple health conditions.   Objective:   Blood pressure 107/71, pulse 70, temperature 97.8 F (36.6 C), temperature source Oral, height 5\' 2"  (1.575 m), weight 227 lb (103 kg), SpO2 99 %. Body  mass index is 41.52 kg/m.  General: Cooperative, alert, well developed, in no acute distress. HEENT: Conjunctivae and lids unremarkable. Cardiovascular: Regular rhythm.  Lungs: Normal work of breathing. Neurologic: No focal deficits.   Lab Results  Component Value Date   CREATININE 0.82 04/11/2015   BUN 10  04/11/2015   NA 139 04/11/2015   K 4.0 04/11/2015   CL 107 04/11/2015   CO2 24 04/11/2015   Lab Results  Component Value Date   ALT 17 04/11/2015   AST 19 04/11/2015   ALKPHOS 81 04/11/2015   BILITOT 0.2 (L) 04/11/2015   No results found for: HGBA1C Lab Results  Component Value Date   INSULIN 23.2 11/18/2019   No results found for: TSH No results found for: CHOL, HDL, LDLCALC, LDLDIRECT, TRIG, CHOLHDL Lab Results  Component Value Date   WBC 8.7 04/11/2015   HGB 12.3 04/11/2015   HCT 38.1 04/11/2015   MCV 86.8 04/11/2015   PLT 371 04/11/2015   No results found for: IRON, TIBC, FERRITIN  Attestation Statements:   Reviewed by clinician on day of visit: allergies, medications, problem list, medical history, surgical history, family history, social history, and previous encounter notes.  Time spent on visit including pre-visit chart review and post-visit charting and care was 30 minutes.   [name] Payment, am acting as transcriptionist for [name], PA-C   I have reviewed the above documentation for accuracy and completeness, and I agree with the above. [name], PA-C

## 2020-05-12 ENCOUNTER — Other Ambulatory Visit: Payer: Self-pay

## 2020-05-12 ENCOUNTER — Other Ambulatory Visit: Payer: Self-pay | Admitting: Orthopedic Surgery

## 2020-05-12 ENCOUNTER — Ambulatory Visit (INDEPENDENT_AMBULATORY_CARE_PROVIDER_SITE_OTHER): Payer: 59 | Admitting: Physician Assistant

## 2020-05-12 ENCOUNTER — Encounter (INDEPENDENT_AMBULATORY_CARE_PROVIDER_SITE_OTHER): Payer: Self-pay | Admitting: Physician Assistant

## 2020-05-12 VITALS — BP 131/73 | HR 77 | Temp 98.0°F | Ht 62.0 in | Wt 225.0 lb

## 2020-05-12 DIAGNOSIS — M25511 Pain in right shoulder: Secondary | ICD-10-CM

## 2020-05-12 DIAGNOSIS — Z6841 Body Mass Index (BMI) 40.0 and over, adult: Secondary | ICD-10-CM

## 2020-05-12 DIAGNOSIS — E038 Other specified hypothyroidism: Secondary | ICD-10-CM

## 2020-05-12 DIAGNOSIS — E559 Vitamin D deficiency, unspecified: Secondary | ICD-10-CM

## 2020-05-13 NOTE — Progress Notes (Signed)
Chief Complaint:   OBESITY Melissa [name] is here to discuss her progress with her obesity treatment plan along with follow-up of her obesity related diagnoses. Melissa [name] is on the Category 2 Plan + 200 protein calories and states she is following her eating plan approximately 90% of the time. [name] states she is doing yard work/walking 30-60 minutes 5 times per week.  Today's visit was #: 11 Starting weight: 256 lbs Starting date: 11/18/2019 Today's weight: 225 lbs Today's date: 05/12/2020 Total lbs lost to date: 31 Total lbs lost since last in-office visit: 2  Interim History: [name] states that her hunger is much more controlled since adding 200 protein calories. She is eating out for breakfast periodically.Current symptoms:     Subjective:   Other specified hypothyroidism. Melissa [name] is on levothyroxine and denies excessive fatigue.  Vitamin D deficiency. Melissa [name] is on Vitamin D supplementation. No nausea, vomiting, or muscle weakness.    Ref. Range 11/18/2019 10:06  Vitamin D, 25-Hydroxy Latest Ref Range: 30.0 - 100.0 ng/mL 64.9   Assessment/Plan:   Other specified hypothyroidism. Patient with long-standing hypothyroidism, on levothyroxine therapy. She appears euthyroid. Orders and follow up as documented in patient record. Melissa [name] will continue levothyroxine as directed.  Counseling . Good thyroid control is important for overall health. Supratherapeutic thyroid levels are dangerous and will not improve weight loss results. . The correct way to take levothyroxine is fasting, with water, separated by at least 30 minutes from breakfast, and separated by more than 4 hours from calcium, iron, multivitamins, acid reflux medications (PPIs).   Vitamin D deficiency. Low Vitamin D level contributes to fatigue and are associated with obesity, breast, and colon cancer. She agrees to continue to take Vitamin D as directed and will follow-up for routine testing of Vitamin D, at least 2-3  times per year to avoid over-replacement.  Class 3 severe obesity with serious comorbidity and body mass index (BMI) of 40.0 to 44.9 in adult, unspecified obesity type (HCC).  [name] is currently in the action stage of change. As such, her goal is to continue with weight loss efforts. She has agreed to the Category 2 Plan + 200 protein calories.   Exercise goals: For substantial health benefits, adults should do at least 150 minutes (2 hours and 30 minutes) a week of moderate-intensity, or 75 minutes (1 hour and 15 minutes) a week of vigorous-intensity aerobic physical activity, or an equivalent combination of moderate- and vigorous-intensity aerobic activity. Aerobic activity should be performed in episodes of at least 10 minutes, and preferably, it should be spread throughout the week.  Behavioral modification strategies: meal planning and cooking strategies and planning for success.  Melissa [name] has agreed to follow-up with our clinic in 2-3 weeks. She was informed of the importance of frequent follow-up visits to maximize her success with intensive lifestyle modifications for her multiple health conditions.   Objective:   Blood pressure 131/73, pulse 77, temperature 98 F (36.7 C), temperature source Oral, height 5\' 2"  (1.575 m), weight 225 lb (102.1 kg), SpO2 97 %. Body mass index is 41.15 kg/m.  General: Cooperative, alert, well developed, in no acute distress. HEENT: Conjunctivae and lids unremarkable. Cardiovascular: Regular rhythm.  Lungs: Normal work of breathing. Neurologic: No focal deficits.   Lab Results  Component Value Date   CREATININE 0.82 04/11/2015   BUN 10 04/11/2015   NA 139 04/11/2015   K 4.0 04/11/2015   CL 107 04/11/2015   CO2 24 04/11/2015   Lab Results  Component Value Date   ALT 17 04/11/2015   AST 19 04/11/2015   ALKPHOS 81 04/11/2015   BILITOT 0.2 (L) 04/11/2015   No results found for: HGBA1C Lab Results  Component Value Date   INSULIN 23.2  11/18/2019   No results found for: TSH No results found for: CHOL, HDL, LDLCALC, LDLDIRECT, TRIG, CHOLHDL Lab Results  Component Value Date   WBC 8.7 04/11/2015   HGB 12.3 04/11/2015   HCT 38.1 04/11/2015   MCV 86.8 04/11/2015   PLT 371 04/11/2015   No results found for: IRON, TIBC, FERRITIN  Attestation Statements:   Reviewed by clinician on day of visit: allergies, medications, problem list, medical history, surgical history, family history, social history, and previous encounter notes.  Time spent on visit including pre-visit chart review and post-visit charting and care was 31 minutes.   [name] Payment, am acting as transcriptionist for [name], PA-C   I have reviewed the above documentation for accuracy and completeness, and I agree with the above. [name], PA-C

## 2020-05-14 ENCOUNTER — Ambulatory Visit: Payer: 59 | Admitting: Professional

## 2020-05-27 ENCOUNTER — Ambulatory Visit
Admission: RE | Admit: 2020-05-27 | Discharge: 2020-05-27 | Disposition: A | Discharge status: Home / Self Care | Payer: 59 | Source: Ambulatory Visit | Attending: Orthopedic Surgery | Admitting: Orthopedic Surgery

## 2020-05-27 ENCOUNTER — Other Ambulatory Visit: Payer: Self-pay

## 2020-05-27 DIAGNOSIS — M25511 Pain in right shoulder: Secondary | ICD-10-CM

## 2020-05-28 ENCOUNTER — Encounter (INDEPENDENT_AMBULATORY_CARE_PROVIDER_SITE_OTHER): Payer: Self-pay | Admitting: Physician Assistant

## 2020-05-28 ENCOUNTER — Ambulatory Visit (INDEPENDENT_AMBULATORY_CARE_PROVIDER_SITE_OTHER): Payer: 59 | Admitting: Physician Assistant

## 2020-05-28 VITALS — BP 121/65 | HR 72 | Temp 98.0°F | Ht 62.0 in | Wt 221.0 lb

## 2020-05-28 DIAGNOSIS — E8881 Metabolic syndrome: Secondary | ICD-10-CM

## 2020-05-28 DIAGNOSIS — G4733 Obstructive sleep apnea (adult) (pediatric): Secondary | ICD-10-CM | POA: Diagnosis not present

## 2020-05-28 DIAGNOSIS — Z6841 Body Mass Index (BMI) 40.0 and over, adult: Secondary | ICD-10-CM

## 2020-06-01 NOTE — Progress Notes (Signed)
Chief Complaint:   OBESITY Melissa [name] is here to discuss her progress with her obesity treatment plan along with follow-up of her obesity related diagnoses. [name] is on the Category 2 Plan + 200 calories and states she is following her eating plan approximately 85% of the time. [name] states she is doing yard work/walking 6,000 steps 5 times per week.  Today's visit was #: 12 Starting weight: 256 lbs Starting date: 11/18/2019 Today's weight: 221 lbs Today's date: 05/28/2020 Total lbs lost to date: 35 Total lbs lost since last in-office visit: 4  Interim History: [name] states that the heat makes it harder for her to get all of her food in as it decreases her appetite. She is substituting cottage cheese, yogurt, and beef jerky. She denies excessive fatigue.  Subjective:   OSA (obstructive sleep apnea). [name] is using CPAP nightly as prescribed and reports she is sleeping well.  Insulin resistance. [name] has a diagnosis of insulin resistance based on her elevated fasting insulin level >5. She continues to work on diet and exercise to decrease her risk of diabetes. [name] is on no medication. She denies polyphagia.  Lab Results  Component Value Date   INSULIN 23.2 11/18/2019   No results found for: HGBA1C  Assessment/Plan:   OSA (obstructive sleep apnea). Intensive lifestyle modifications are the first line treatment for this issue. We discussed several lifestyle modifications today and she will continue to work on diet, exercise and weight loss efforts. We will continue to monitor. Orders and follow up as documented in patient record. [name] will continue to use CPAP as directed.   Counseling  Sleep apnea is a condition in which breathing pauses or becomes shallow during sleep. This happens over and over during the night. This disrupts your sleep and keeps your body from getting the rest that it needs, which can cause tiredness and lack of energy (fatigue) during the  day.  Sleep apnea treatment: If you were given a device to open your airway while you sleep, USE IT!  Sleep hygiene:   Limit or avoid alcohol, caffeinated beverages, and cigarettes, especially close to bedtime.   Do not eat a large meal or eat spicy foods right before bedtime. This can lead to digestive discomfort that can make it hard for you to sleep.  Keep a sleep diary to help you and your health care provider figure out what could be causing your insomnia.  . Make your bedroom a dark, comfortable place where it is easy to fall asleep. ? Put up shades or blackout curtains to block light from outside. ? Use a white noise machine to block noise. ? Keep the temperature cool. . Limit screen use before bedtime. This includes: ? Watching TV. ? Using your smartphone, tablet, or computer. . Stick to a routine that includes going to bed and waking up at the same times every day and night. This can help you fall asleep faster. Consider making a quiet activity, such as reading, part of your nighttime routine. . Try to avoid taking naps during the day so that you sleep better at night. . Get out of bed if you are still awake after 15 minutes of trying to sleep. Keep the lights down, but try reading or doing a quiet activity. When you feel sleepy, go back to bed.  Insulin resistance. [name] will continue to work on weight loss, exercise, and decreasing simple carbohydrates to help decrease the risk of diabetes. [name] agreed to follow-up  with [name] as directed to closely monitor her progress.  Class 3 severe obesity with serious comorbidity and body mass index (BMI) of 40.0 to 44.9 in adult, unspecified obesity type (HCC).  [name] is currently in the action stage of change. As such, her goal is to continue with weight loss efforts. She has agreed to the Category 2 Plan + 200 calories.   Exercise goals: For substantial health benefits, adults should do at least 150 minutes (2 hours and 30 minutes) a  week of moderate-intensity, or 75 minutes (1 hour and 15 minutes) a week of vigorous-intensity aerobic physical activity, or an equivalent combination of moderate- and vigorous-intensity aerobic activity. Aerobic activity should be performed in episodes of at least 10 minutes, and preferably, it should be spread throughout the week.  Behavioral modification strategies: no skipping meals and meal planning and cooking strategies.  [name] has agreed to follow-up with our clinic fasting in 2-3 weeks. She was informed of the importance of frequent follow-up visits to maximize her success with intensive lifestyle modifications for her multiple health conditions.   Objective:   Blood pressure 121/65, pulse 72, temperature 98 F (36.7 C), temperature source Oral, height 5\' 2"  (1.575 m), weight 221 lb (100.2 kg), SpO2 100 %. Body mass index is 40.42 kg/m.  General: Cooperative, alert, well developed, in no acute distress. HEENT: Conjunctivae and lids unremarkable. Cardiovascular: Regular rhythm.  Lungs: Normal work of breathing. Neurologic: No focal deficits.   Lab Results  Component Value Date   CREATININE 0.82 04/11/2015   BUN 10 04/11/2015   NA 139 04/11/2015   K 4.0 04/11/2015   CL 107 04/11/2015   CO2 24 04/11/2015   Lab Results  Component Value Date   ALT 17 04/11/2015   AST 19 04/11/2015   ALKPHOS 81 04/11/2015   BILITOT 0.2 (L) 04/11/2015   No results found for: HGBA1C Lab Results  Component Value Date   INSULIN 23.2 11/18/2019   No results found for: TSH No results found for: CHOL, HDL, LDLCALC, LDLDIRECT, TRIG, CHOLHDL Lab Results  Component Value Date   WBC 8.7 04/11/2015   HGB 12.3 04/11/2015   HCT 38.1 04/11/2015   MCV 86.8 04/11/2015   PLT 371 04/11/2015   No results found for: IRON, TIBC, FERRITIN  Attestation Statements:   Reviewed by clinician on day of visit: allergies, medications, problem list, medical history, surgical history, family history, social  history, and previous encounter notes.  Time spent on visit including pre-visit chart review and post-visit charting and care was 30 minutes.   I06/20/2016, am acting as transcriptionist for Marianna Payment, PA-C   I have reviewed the above documentation for accuracy and completeness, and I agree with the above. [name], PA-C

## 2020-06-11 ENCOUNTER — Ambulatory Visit: Payer: 59 | Admitting: Professional

## 2020-06-12 NOTE — Progress Notes (Addendum)
COVID Vaccine Completed:x2 Date COVID Vaccine completed: 01-16-20 & 02-18-20 COVID vaccine manufacturer: Pfizer    Moderna   [employer]   PCP - Elizabeth Palau, FNP.  Saw 06-15-20 for chest pain.  Per patient cause anxiety attack.  Note in [facility].  Not referred to cardiology Cardiologist - N/A  Chest x-ray -  EKG - 06-15-20 on chart Stress Test -  ECHO -  Cardiac Cath -   Sleep Study - Thinks 4 years ago through [facility] CPAP - Yes Settings 4 to 10   Fasting Blood Sugar -  Checks Blood Sugar - N/A  Blood Thinner Instructions: Aspirin Instructions:  ASA 81 mg on hold for surgery Last Dose: 06-16-20  Anesthesia review: OSA.  Chest pain on 06/15/20, saw PCP ruled as anxiety attack.   Patient denies shortness of breath, fever, cough and chest pain at PAT appointment   Patient verbalized understanding of instructions that were given to them at the PAT appointment. Patient was also instructed that they will need to review over the PAT instructions again at home before surgery.

## 2020-06-12 NOTE — Patient Instructions (Addendum)
DUE TO COVID-19 ONLY ONE VISITOR IS ALLOWED TO COME WITH YOU AND STAY IN THE WAITING ROOM ONLY DURING PRE OP AND PROCEDURE.   IF YOU WILL BE ADMITTED INTO THE HOSPITAL YOU ARE ALLOWED ONE SUPPORT PERSON DURING VISITATION HOURS ONLY (10AM -8PM)   . The support person may change daily. . The support person must pass our screening, gel in and out, and wear a mask at all times, including in the patient's room. . Patients must also wear a mask when staff or their support person are in the room.   COVID SWAB TESTING MUST BE COMPLETED ON:  06-22-20 @ 2:55 PM    [address] [place], [place] [id number]  (Must self quarantine after testing. Follow instructions on handout.)        Your procedure is scheduled on:  Thursday, 06-25-20   [facility]   Report to Short Stay at 5:30 AM   [facility](Map Enclosed)      Call this number if you have problems the morning of surgery [phone]   Do not eat food :After Midnight.   May have liquids until 4:30 AM   day of surgery  CLEAR LIQUID DIET  Foods Allowed                                                                     Foods Excluded  Water, Black Coffee and tea, regular and decaf             liquids that you cannot  Plain Jell-O in any flavor  (No red)                                   see through such as: Fruit ices (not with fruit pulp)                                      milk, soups, orange juice              Iced Popsicles (No red)                                      All solid food                                   Apple juices Sports drinks like Gatorade (No red) Lightly seasoned clear broth or consume(fat free) Sugar, honey syrup     Complete one G2 drink the morning of surgery at  4:30 AM the day of surgery.     Oral Hygiene is also important to reduce your risk of infection.                                     Remember - BRUSH YOUR TEETH THE MORNING OF SURGERY WITH YOUR REGULAR TOOTHPASTE   Do NOT  smoke after Midnight   Take these medicines the morning of surgery with A SIP OF WATER: Bupropion, Fluoxetine, Levothyroxine, Montelukast, Omeprazole, Pravastatin, Chlorpheniramine   Bring CPAP mask and tubing day of surgery                                You may not have any metal on your body including hair pins, jewelry, and body piercings             Do not wear make-up, lotions, powders, perfumes/cologne, or deodorant             Do not wear nail polish.  Do not shave  48 hours prior to surgery.    Do not bring valuables to the hospital. [facility] IS NOT RESPONSIBLE   FOR VALUABLES.   Contacts, dentures or bridgework may not be worn into surgery.   Bring small overnight bag day of surgery.                 Please read over the following fact sheets you were given: IF YOU HAVE QUESTIONS ABOUT YOUR PRE OP INSTRUCTIONS PLEASE  CALL [phone]   [place]- Preparing for Total Shoulder Arthroplasty    Before surgery, you can play an important role. Because skin is not sterile, your skin needs to be as free of germs as possible. You can reduce the number of germs on your skin by using the following products. . Benzoyl Peroxide Gel o Reduces the number of germs present on the skin o Applied twice a day to shoulder area starting two days before surgery    ==================================================================  Please follow these instructions carefully:  BENZOYL PEROXIDE 5% GEL  Please do not use if you have an allergy to benzoyl peroxide.   If your skin becomes reddened/irritated stop using the benzoyl peroxide.  Starting two days before surgery, apply as follows: 1. Apply benzoyl peroxide in the morning and at night. Apply after taking a shower. If you are not taking a shower clean entire shoulder front, back, and side along with the armpit with a clean wet washcloth.  2. Place a quarter-sized dollop on your shoulder and rub in thoroughly, making sure to cover  the front, back, and side of your shoulder, along with the armpit.   2 days before ____ AM   ____ PM              1 day before ____ AM   ____ PM                         3. Do this twice a day for two days.  (Last application is the night before surgery, AFTER using the CHG soap as described below).  4. Do NOT apply benzoyl peroxide gel on the day of surgery. [place] - Preparing for Surgery Before surgery, you can play an important role.  Because skin is not sterile, your skin needs to be as free of germs as possible.  You can reduce the number of germs on your skin by washing with CHG (chlorahexidine gluconate) soap before surgery.  CHG is an antiseptic cleaner which kills germs and bonds with the skin to continue killing germs even after washing. Please DO NOT use if you have an allergy to CHG or antibacterial soaps.  If your skin becomes reddened/irritated stop using the CHG and inform your nurse when you  arrive at Short Stay. Do not shave (including legs and underarms) for at least 48 hours prior to the first CHG shower.  You may shave your face/neck.  Please follow these instructions carefully:  1.  Shower with CHG Soap the night before surgery and the  morning of surgery.  2.  If you choose to wash your hair, wash your hair first as usual with your normal  shampoo.  3.  After you shampoo, rinse your hair and body thoroughly to remove the shampoo.                             4.  Use CHG as you would any other liquid soap.  You can apply chg directly to the skin and wash.  Gently with a scrungie or clean washcloth.  5.  Apply the CHG Soap to your body ONLY FROM THE NECK DOWN.   Do   not use on face/ open                           Wound or open sores. Avoid contact with eyes, ears mouth and   genitals (private parts).                       Wash face,  Genitals (private parts) with your normal soap.             6.  Wash thoroughly, paying special attention to the area where your    surgery   will be performed.  7.  Thoroughly rinse your body with warm water from the neck down.  8.  DO NOT shower/wash with your normal soap after using and rinsing off the CHG Soap.                9.  Pat yourself dry with a clean towel.            10.  Wear clean pajamas.            11.  Place clean sheets on your bed the night of your first shower and do not  sleep with pets. Day of Surgery : Do not apply any lotions/deodorants the morning of surgery.  Please wear clean clothes to the hospital/surgery center.  FAILURE TO FOLLOW THESE INSTRUCTIONS MAY RESULT IN THE CANCELLATION OF YOUR SURGERY  PATIENT SIGNATURE_________________________________  NURSE SIGNATURE__________________________________  ________________________________________________________________________   Melissa [name]  An incentive spirometer is a tool that can help keep your lungs clear and active. This tool measures how well you are filling your lungs with each breath. Taking long deep breaths may help reverse or decrease the chance of developing breathing (pulmonary) problems (especially infection) following:  A long period of time when you are unable to move or be active. BEFORE THE PROCEDURE   If the spirometer includes an indicator to show your best effort, your nurse or respiratory therapist will set it to a desired goal.  If possible, sit up straight or lean slightly forward. Try not to slouch.  Hold the incentive spirometer in an upright position. INSTRUCTIONS FOR USE  1. Sit on the edge of your bed if possible, or sit up as far as you can in bed or on a chair. 2. Hold the incentive spirometer in an upright position. 3. Breathe out normally. 4. Place the mouthpiece in your mouth and seal your lips tightly around it. 5.  Breathe in slowly and as deeply as possible, raising the piston or the ball toward the top of the column. 6. Hold your breath for 3-5 seconds or for as long as possible. Allow the piston or  ball to fall to the bottom of the column. 7. Remove the mouthpiece from your mouth and breathe out normally. 8. Rest for a few seconds and repeat Steps 1 through 7 at least 10 times every 1-2 hours when you are awake. Take your time and take a few normal breaths between deep breaths. 9. The spirometer may include an indicator to show your best effort. Use the indicator as a goal to work toward during each repetition. 10. After each set of 10 deep breaths, practice coughing to be sure your lungs are clear. If you have an incision (the cut made at the time of surgery), support your incision when coughing by placing a pillow or rolled up towels firmly against it. Once you are able to get out of bed, walk around indoors and cough well. You may stop using the incentive spirometer when instructed by your caregiver.  RISKS AND COMPLICATIONS  Take your time so you do not get dizzy or light-headed.  If you are in pain, you may need to take or ask for pain medication before doing incentive spirometry. It is harder to take a deep breath if you are having pain. AFTER USE  Rest and breathe slowly and easily.  It can be helpful to keep track of a log of your progress. Your caregiver can provide you with a simple table to help with this. If you are using the spirometer at home, follow these instructions: SEEK MEDICAL CARE IF:   You are having difficultly using the spirometer.  You have trouble using the spirometer as often as instructed.  Your pain medication is not giving enough relief while using the spirometer.  You develop fever of 100.5 F (38.1 C) or higher. SEEK IMMEDIATE MEDICAL CARE IF:   You cough up bloody sputum that had not been present before.  You develop fever of 102 F (38.9 C) or greater.  You develop worsening pain at or near the incision site. MAKE SURE YOU:   Understand these instructions.  Will watch your condition.  Will get help right away if you are not doing well  or get worse. Document Released: 02/20/2007 Document Revised: 01/02/2012 Document Reviewed: 04/23/2007 [facility] Patient Information 2014 [place], [place].   ________________________________________________________________________

## 2020-06-16 ENCOUNTER — Encounter (INDEPENDENT_AMBULATORY_CARE_PROVIDER_SITE_OTHER): Payer: Self-pay | Admitting: Physician Assistant

## 2020-06-16 NOTE — Telephone Encounter (Signed)
Please review. Thanks!  

## 2020-06-18 ENCOUNTER — Encounter (INDEPENDENT_AMBULATORY_CARE_PROVIDER_SITE_OTHER): Payer: Self-pay | Admitting: Physician Assistant

## 2020-06-18 ENCOUNTER — Ambulatory Visit (INDEPENDENT_AMBULATORY_CARE_PROVIDER_SITE_OTHER): Payer: 59 | Admitting: Physician Assistant

## 2020-06-18 ENCOUNTER — Other Ambulatory Visit: Payer: Self-pay

## 2020-06-18 VITALS — BP 112/73 | HR 67 | Temp 97.5°F | Ht 62.0 in | Wt 225.0 lb

## 2020-06-18 DIAGNOSIS — E8881 Metabolic syndrome: Secondary | ICD-10-CM | POA: Diagnosis not present

## 2020-06-18 DIAGNOSIS — Z9189 Other specified personal risk factors, not elsewhere classified: Secondary | ICD-10-CM | POA: Diagnosis not present

## 2020-06-18 DIAGNOSIS — E559 Vitamin D deficiency, unspecified: Secondary | ICD-10-CM | POA: Diagnosis not present

## 2020-06-18 DIAGNOSIS — Z6841 Body Mass Index (BMI) 40.0 and over, adult: Secondary | ICD-10-CM

## 2020-06-18 DIAGNOSIS — E7849 Other hyperlipidemia: Secondary | ICD-10-CM

## 2020-06-18 DIAGNOSIS — E038 Other specified hypothyroidism: Secondary | ICD-10-CM | POA: Diagnosis not present

## 2020-06-18 NOTE — Progress Notes (Signed)
Chief Complaint:   OBESITY Melissa [name] is here to discuss her progress with her obesity treatment plan along with follow-up of her obesity related diagnoses. Melissa [name] is on the Category 2 Plan + 200 calories and states she is following her eating plan approximately 80% of the time. [name] states she is walking 5,000 to 6,000 steps.   Today's visit was #: 13 Starting weight: 256 lbs Starting date: 11/18/2019 Today's weight: 225 lbs Today's date: 06/18/2020 Total lbs lost to date: 31 Total lbs lost since last in-office visit: 0  Interim History: [name] reports that she has been stress eating due to multiple recent events. She is having right shoulder replacement surgery next week. She has had some chest pain recently and was diagnosed by her PCP with anxiety after having a normal EKG.  Subjective:   Insulin resistance. Melissa [name] has a diagnosis of insulin resistance based on her elevated fasting insulin level >5. She continues to work on diet and exercise to decrease her risk of diabetes. Melissa [name] is on no medications and denies polyphagia. She is due for labs.  Lab Results  Component Value Date   INSULIN 23.2 11/18/2019   No results found for: HGBA1C  Vitamin D deficiency. Safari is on Vitamin D 2,000 units OTC daily. She is due for labs.   Ref. Range 11/18/2019 10:06  Vitamin D, 25-Hydroxy Latest Ref Range: 30.0 - 100.0 ng/mL 64.9   Other specified hypothyroidism. Melissa [name] is on levothyroxine and denies excessive fatigue.  No results found for: TSH  Other hyperlipidemia. Melissa [name] is on pravastatin, which she is tolerating well.   No results found for: CHOL, HDL, LDLCALC, LDLDIRECT, TRIG, CHOLHDL Lab Results  Component Value Date   ALT 17 04/11/2015   AST 19 04/11/2015   ALKPHOS 81 04/11/2015   BILITOT 0.2 (L) 04/11/2015   The ASCVD Risk score Denman George DC Jr., et al., 2013) failed to calculate for the following reasons:   Cannot find a previous HDL lab  At risk for diabetes  mellitus. Melissa [name] is at higher than average risk for developing diabetes due to her obesity.   Assessment/Plan:   Insulin resistance. Melissa [name] will continue to work on weight loss, exercise, and decreasing simple carbohydrates to help decrease the risk of diabetes. Melissa [name] agreed to follow-up with [name] as directed to closely monitor her progress. Labs will be checked today.  Vitamin D deficiency. Low Vitamin D level contributes to fatigue and are associated with obesity, breast, and colon cancer. She agrees to continue to take OTC Vitamin D as directed and VITAMIN D 25 Hydroxy (Vit-D Deficiency, Fractures) level will be checked today.  Other specified hypothyroidism. Patient with long-standing hypothyroidism, on levothyroxine therapy. She appears euthyroid. Orders and follow up as documented in patient record. Melissa [name] will continue with levothyroxine as directed. Labs will be checked today.  Counseling . Good thyroid control is important for overall health. Supratherapeutic thyroid levels are dangerous and will not improve weight loss results. . The correct way to take levothyroxine is fasting, with water, separated by at least 30 minutes from breakfast, and separated by more than 4 hours from calcium, iron, multivitamins, acid reflux medications (PPIs).   Other hyperlipidemia. Cardiovascular risk and specific lipid/LDL goals reviewed.  We discussed several lifestyle modifications today and [name] will continue to work on diet, exercise and weight loss efforts. Orders and follow up as documented in patient record. She will continue her medication as directed.   Counseling Intensive lifestyle modifications are the first  line treatment for this issue. . Dietary changes: Increase soluble fiber. Decrease simple carbohydrates. . Exercise changes: Moderate to vigorous-intensity aerobic activity 150 minutes per week if tolerated. . Lipid-lowering medications: see documented in medical record.    At risk for  diabetes mellitus. Melissa [name] was given approximately 15 minutes of diabetes education and counseling today. We discussed intensive lifestyle modifications today with an emphasis on weight loss as well as increasing exercise and decreasing simple carbohydrates in her diet. We also reviewed medication options with an emphasis on risk versus benefit of those discussed.   Repetitive spaced learning was employed today to elicit superior memory formation and behavioral change.  Class 3 severe obesity with serious comorbidity and body mass index (BMI) of 40.0 to 44.9 in adult, unspecified obesity type (HCC).   Melissa [name] is currently in the action stage of change. As such, her goal is to continue with weight loss efforts. She has agreed to the Category 2 Plan + 200 calories.   Melissa [name] will follow-up with her PCP for referral to Cardiology.  Exercise goals: For substantial health benefits, adults should do at least 150 minutes (2 hours and 30 minutes) a week of moderate-intensity, or 75 minutes (1 hour and 15 minutes) a week of vigorous-intensity aerobic physical activity, or an equivalent combination of moderate- and vigorous-intensity aerobic activity. Aerobic activity should be performed in episodes of at least 10 minutes, and preferably, it should be spread throughout the week.  Behavioral modification strategies: increasing lean protein intake and better snacking choices.  Melissa [name] has agreed to follow-up with our clinic in 4 weeks due to having surgery next week. She was informed of the importance of frequent follow-up visits to maximize her success with intensive lifestyle modifications for her multiple health conditions.   Melissa [name] was informed we would discuss her lab results at her next visit unless there is a critical issue that needs to be addressed sooner. Melissa [name] agreed to keep her next visit at the agreed upon time to discuss these results.  Objective:   Blood pressure 112/73, pulse 67, temperature (!)  97.5 F (36.4 C), temperature source Oral, height 5\' 2"  (1.575 m), weight 225 lb (102.1 kg), SpO2 99 %. Body mass index is 41.15 kg/m.  General: Cooperative, alert, well developed, in no acute distress. HEENT: Conjunctivae and lids unremarkable. Cardiovascular: Regular rhythm.  Lungs: Normal work of breathing. Neurologic: No focal deficits.   Lab Results  Component Value Date   CREATININE 0.82 04/11/2015   BUN 10 04/11/2015   NA 139 04/11/2015   K 4.0 04/11/2015   CL 107 04/11/2015   CO2 24 04/11/2015   Lab Results  Component Value Date   ALT 17 04/11/2015   AST 19 04/11/2015   ALKPHOS 81 04/11/2015   BILITOT 0.2 (L) 04/11/2015   No results found for: HGBA1C Lab Results  Component Value Date   INSULIN 23.2 11/18/2019   No results found for: TSH No results found for: CHOL, HDL, LDLCALC, LDLDIRECT, TRIG, CHOLHDL Lab Results  Component Value Date   WBC 8.7 04/11/2015   HGB 12.3 04/11/2015   HCT 38.1 04/11/2015   MCV 86.8 04/11/2015   PLT 371 04/11/2015   No results found for: IRON, TIBC, FERRITIN  Attestation Statements:   Reviewed by clinician on day of visit: allergies, medications, problem list, medical history, surgical history, family history, social history, and previous encounter notes.  04/13/2015, am acting as Fernanda Drum for Energy manager, PA-C   I have reviewed the above  documentation for accuracy and completeness, and I agree with the above. Abby Potash, PA-C

## 2020-06-19 ENCOUNTER — Encounter (HOSPITAL_COMMUNITY): Payer: Self-pay

## 2020-06-19 ENCOUNTER — Encounter (HOSPITAL_COMMUNITY)
Admission: RE | Admit: 2020-06-19 | Discharge: 2020-06-19 | Disposition: A | Discharge status: Home / Self Care | Payer: 59 | Source: Ambulatory Visit | Attending: Orthopedic Surgery | Admitting: Orthopedic Surgery

## 2020-06-19 DIAGNOSIS — Z01812 Encounter for preprocedural laboratory examination: Secondary | ICD-10-CM | POA: Insufficient documentation

## 2020-06-19 DIAGNOSIS — A4901 Methicillin susceptible Staphylococcus aureus infection, unspecified site: Secondary | ICD-10-CM | POA: Insufficient documentation

## 2020-06-19 HISTORY — DX: Tinnitus, unspecified ear: H93.19

## 2020-06-19 LAB — COMPREHENSIVE METABOLIC PANEL
ALT: 17 IU/L (ref 0–32)
AST: 17 IU/L (ref 0–40)
Albumin/Globulin Ratio: 1.7 (ref 1.2–2.2)
Albumin: 4.3 g/dL (ref 3.8–4.9)
Alkaline Phosphatase: 108 IU/L (ref 48–121)
BUN/Creatinine Ratio: 16 (ref 9–23)
BUN: 16 mg/dL (ref 6–24)
Bilirubin Total: 0.3 mg/dL (ref 0.0–1.2)
CO2: 23 mmol/L (ref 20–29)
Calcium: 9.8 mg/dL (ref 8.7–10.2)
Chloride: 103 mmol/L (ref 96–106)
Creatinine, Ser: 0.97 mg/dL (ref 0.57–1.00)
GFR calc Af Amer: 76 mL/min/{1.73_m2} (ref >59)
GFR calc non Af Amer: 65 mL/min/{1.73_m2} (ref >59)
Globulin, Total: 2.6 g/dL (ref 1.5–4.5)
Glucose: 112 mg/dL — ABNORMAL HIGH (ref 65–99)
Potassium: 4.8 mmol/L (ref 3.5–5.2)
Sodium: 142 mmol/L (ref 134–144)
Total Protein: 6.9 g/dL (ref 6.0–8.5)

## 2020-06-19 LAB — CBC
HCT: 42.4 % (ref 36.0–46.0)
Hemoglobin: 13.2 g/dL (ref 12.0–15.0)
MCH: 27.5 pg (ref 26.0–34.0)
MCHC: 31.1 g/dL (ref 30.0–36.0)
MCV: 88.3 fL (ref 80.0–100.0)
Platelets: 374 10*3/uL (ref 150–400)
RBC: 4.8 MIL/uL (ref 3.87–5.11)
RDW: 14.5 % (ref 11.5–15.5)
WBC: 8.1 10*3/uL (ref 4.0–10.5)
nRBC: 0 % (ref 0.0–0.2)

## 2020-06-19 LAB — THYROID PANEL WITH TSH
Free Thyroxine Index: 2.7 (ref 1.2–4.9)
T3 Uptake Ratio: 28 % (ref 24–39)
T4, Total: 9.7 ug/dL (ref 4.5–12.0)
TSH: 3.91 u[IU]/mL (ref 0.450–4.500)

## 2020-06-19 LAB — SURGICAL PCR SCREEN
MRSA, PCR: NEGATIVE
Staphylococcus aureus: POSITIVE — AB

## 2020-06-19 LAB — HEMOGLOBIN A1C
Est. average glucose Bld gHb Est-mCnc: 123 mg/dL
Hgb A1c MFr Bld: 5.9 % — ABNORMAL HIGH (ref 4.8–5.6)

## 2020-06-19 LAB — INSULIN, RANDOM: INSULIN: 12.9 u[IU]/mL (ref 2.6–24.9)

## 2020-06-19 LAB — VITAMIN D 25 HYDROXY (VIT D DEFICIENCY, FRACTURES): Vit D, 25-Hydroxy: 49.1 ng/mL (ref 30.0–100.0)

## 2020-06-19 NOTE — Progress Notes (Signed)
PCR results sent to Dr. Rogers to review. 

## 2020-06-22 ENCOUNTER — Other Ambulatory Visit (HOSPITAL_COMMUNITY)
Admission: RE | Admit: 2020-06-22 | Discharge: 2020-06-22 | Disposition: A | Discharge status: Home / Self Care | Payer: 59 | Source: Ambulatory Visit | Attending: Orthopedic Surgery | Admitting: Orthopedic Surgery

## 2020-06-22 DIAGNOSIS — Z20822 Contact with and (suspected) exposure to covid-19: Secondary | ICD-10-CM | POA: Diagnosis not present

## 2020-06-22 DIAGNOSIS — Z01812 Encounter for preprocedural laboratory examination: Secondary | ICD-10-CM | POA: Diagnosis present

## 2020-06-22 LAB — SARS CORONAVIRUS 2 (TAT 6-24 HRS): SARS Coronavirus 2: NEGATIVE

## 2020-06-24 NOTE — Anesthesia Preprocedure Evaluation (Addendum)
Anesthesia Evaluation  Patient identified by MRN, date of birth, ID band Patient awake    Reviewed: Allergy & Precautions, NPO status , Patient's Chart, lab work & pertinent test results  Airway Mallampati: III  TM Distance: <3 FB Neck ROM: Full    Dental no notable dental hx.    Pulmonary sleep apnea ,    Pulmonary exam normal breath sounds clear to auscultation + decreased breath sounds      Cardiovascular hypertension, Pt. on medications Normal cardiovascular exam Rhythm:Regular Rate:Normal     Neuro/Psych negative neurological ROS  negative psych ROS   GI/Hepatic Neg liver ROS, GERD  ,  Endo/Other  Hypothyroidism Morbid obesity  Renal/GU negative Renal ROS  negative genitourinary   Musculoskeletal negative musculoskeletal ROS (+)   Abdominal   Peds negative pediatric ROS (+)  Hematology negative hematology ROS (+)   Anesthesia Other Findings   Reproductive/Obstetrics negative OB ROS                             Anesthesia Physical Anesthesia Plan  ASA: III  Anesthesia Plan: General   Post-op Pain Management:  Regional for Post-op pain   Induction: Intravenous  PONV Risk Score and Plan: 3 and Ondansetron and Dexamethasone  Airway Management Planned: Oral ETT  Additional Equipment:   Intra-op Plan:   Post-operative Plan: Extubation in OR  Informed Consent: I have reviewed the patients History and Physical, chart, labs and discussed the procedure including the risks, benefits and alternatives for the proposed anesthesia with the patient or authorized representative who has indicated his/her understanding and acceptance.     Dental advisory given  Plan Discussed with: CRNA and Surgeon  Anesthesia Plan Comments:         Anesthesia Quick Evaluation

## 2020-06-25 ENCOUNTER — Encounter (HOSPITAL_COMMUNITY): Payer: Self-pay | Admitting: Orthopedic Surgery

## 2020-06-25 ENCOUNTER — Ambulatory Visit (HOSPITAL_COMMUNITY): Payer: 59 | Admitting: Certified Registered"

## 2020-06-25 ENCOUNTER — Ambulatory Visit (HOSPITAL_COMMUNITY)
Admission: RE | Admit: 2020-06-25 | Discharge: 2020-06-25 | Disposition: A | Discharge status: Home / Self Care | Payer: 59 | Attending: Orthopedic Surgery | Admitting: Orthopedic Surgery

## 2020-06-25 ENCOUNTER — Ambulatory Visit (HOSPITAL_COMMUNITY): Payer: 59 | Admitting: Physician Assistant

## 2020-06-25 ENCOUNTER — Other Ambulatory Visit: Payer: Self-pay

## 2020-06-25 ENCOUNTER — Encounter (HOSPITAL_COMMUNITY)
Admission: RE | Disposition: A | Discharge status: Home / Self Care | Payer: Self-pay | Source: Home / Self Care | Attending: Orthopedic Surgery

## 2020-06-25 ENCOUNTER — Ambulatory Visit (HOSPITAL_COMMUNITY): Payer: 59

## 2020-06-25 DIAGNOSIS — Z79890 Hormone replacement therapy (postmenopausal): Secondary | ICD-10-CM | POA: Insufficient documentation

## 2020-06-25 DIAGNOSIS — I4891 Unspecified atrial fibrillation: Secondary | ICD-10-CM | POA: Insufficient documentation

## 2020-06-25 DIAGNOSIS — Z7982 Long term (current) use of aspirin: Secondary | ICD-10-CM | POA: Diagnosis not present

## 2020-06-25 DIAGNOSIS — F329 Major depressive disorder, single episode, unspecified: Secondary | ICD-10-CM | POA: Diagnosis not present

## 2020-06-25 DIAGNOSIS — K219 Gastro-esophageal reflux disease without esophagitis: Secondary | ICD-10-CM | POA: Insufficient documentation

## 2020-06-25 DIAGNOSIS — M19011 Primary osteoarthritis, right shoulder: Secondary | ICD-10-CM | POA: Diagnosis not present

## 2020-06-25 DIAGNOSIS — Z96651 Presence of right artificial knee joint: Secondary | ICD-10-CM | POA: Diagnosis not present

## 2020-06-25 DIAGNOSIS — E039 Hypothyroidism, unspecified: Secondary | ICD-10-CM | POA: Diagnosis not present

## 2020-06-25 DIAGNOSIS — I10 Essential (primary) hypertension: Secondary | ICD-10-CM | POA: Diagnosis not present

## 2020-06-25 DIAGNOSIS — Z79899 Other long term (current) drug therapy: Secondary | ICD-10-CM | POA: Insufficient documentation

## 2020-06-25 DIAGNOSIS — G4733 Obstructive sleep apnea (adult) (pediatric): Secondary | ICD-10-CM | POA: Insufficient documentation

## 2020-06-25 DIAGNOSIS — Z79891 Long term (current) use of opiate analgesic: Secondary | ICD-10-CM | POA: Insufficient documentation

## 2020-06-25 DIAGNOSIS — Z96611 Presence of right artificial shoulder joint: Secondary | ICD-10-CM

## 2020-06-25 DIAGNOSIS — F419 Anxiety disorder, unspecified: Secondary | ICD-10-CM | POA: Insufficient documentation

## 2020-06-25 HISTORY — PX: TOTAL SHOULDER ARTHROPLASTY: SHX126

## 2020-06-25 LAB — PREGNANCY, URINE: Preg Test, Ur: NEGATIVE

## 2020-06-25 SURGERY — ARTHROPLASTY, SHOULDER, TOTAL
Anesthesia: General | Site: Shoulder | Laterality: Right

## 2020-06-25 MED ORDER — OXYCODONE HCL 5 MG PO TABS: 5.0000 mg | ORAL_TABLET | Freq: Once | ORAL | Status: DC | PRN

## 2020-06-25 MED ORDER — ROCURONIUM BROMIDE 10 MG/ML (PF) SYRINGE
PREFILLED_SYRINGE | INTRAVENOUS | Status: DC | PRN
  Administered 2020-06-25 (×2): 10 mg via INTRAVENOUS
  Administered 2020-06-25: 60 mg via INTRAVENOUS

## 2020-06-25 MED ORDER — LACTATED RINGERS IV BOLUS
500.0000 mL | Freq: Once | INTRAVENOUS | Status: AC
  Administered 2020-06-25: 500 mL via INTRAVENOUS

## 2020-06-25 MED ORDER — DEXAMETHASONE SODIUM PHOSPHATE 10 MG/ML IJ SOLN
INTRAMUSCULAR | Status: AC
  Filled 2020-06-25: qty 1

## 2020-06-25 MED ORDER — FENTANYL CITRATE (PF) 100 MCG/2ML IJ SOLN
INTRAMUSCULAR | Status: AC
  Filled 2020-06-25: qty 2

## 2020-06-25 MED ORDER — LACTATED RINGERS IV BOLUS: 250.0000 mL | Freq: Once | INTRAVENOUS | Status: DC

## 2020-06-25 MED ORDER — DEXAMETHASONE SODIUM PHOSPHATE 10 MG/ML IJ SOLN
INTRAMUSCULAR | Status: DC | PRN
  Administered 2020-06-25: 4 mg via INTRAVENOUS

## 2020-06-25 MED ORDER — VANCOMYCIN HCL 1000 MG IV SOLR
INTRAVENOUS | Status: AC
  Filled 2020-06-25: qty 1000

## 2020-06-25 MED ORDER — EPHEDRINE 5 MG/ML INJ
INTRAVENOUS | Status: AC
  Filled 2020-06-25: qty 10

## 2020-06-25 MED ORDER — PHENYLEPHRINE HCL-NACL 10-0.9 MG/250ML-% IV SOLN
INTRAVENOUS | Status: AC
  Filled 2020-06-25: qty 250

## 2020-06-25 MED ORDER — HYDROMORPHONE HCL 1 MG/ML IJ SOLN: 0.2500 mg | INTRAMUSCULAR | Status: DC | PRN

## 2020-06-25 MED ORDER — 0.9 % SODIUM CHLORIDE (POUR BTL) OPTIME
TOPICAL | Status: DC | PRN
  Administered 2020-06-25: 1000 mL

## 2020-06-25 MED ORDER — BUPIVACAINE LIPOSOME 1.3 % IJ SUSP
INTRAMUSCULAR | Status: DC | PRN
  Administered 2020-06-25: 10 mL via PERINEURAL

## 2020-06-25 MED ORDER — METOCLOPRAMIDE HCL 5 MG/ML IJ SOLN
INTRAMUSCULAR | Status: AC
  Filled 2020-06-25: qty 2

## 2020-06-25 MED ORDER — ONDANSETRON HCL 4 MG/2ML IJ SOLN
INTRAMUSCULAR | Status: DC | PRN
  Administered 2020-06-25: 4 mg via INTRAVENOUS

## 2020-06-25 MED ORDER — CHLORHEXIDINE GLUCONATE 0.12 % MT SOLN
15.0000 mL | Freq: Once | OROMUCOSAL | Status: AC
  Administered 2020-06-25: 15 mL via OROMUCOSAL

## 2020-06-25 MED ORDER — ORAL CARE MOUTH RINSE: 15.0000 mL | Freq: Once | OROMUCOSAL | Status: AC

## 2020-06-25 MED ORDER — METOCLOPRAMIDE HCL 5 MG/ML IJ SOLN
10.0000 mg | Freq: Once | INTRAMUSCULAR | Status: AC
  Administered 2020-06-25: 10 mg via INTRAVENOUS

## 2020-06-25 MED ORDER — MIDAZOLAM HCL 2 MG/2ML IJ SOLN
INTRAMUSCULAR | Status: DC | PRN
  Administered 2020-06-25: 2 mg via INTRAVENOUS

## 2020-06-25 MED ORDER — OXYCODONE HCL 5 MG PO TABS
5.0000 mg | ORAL_TABLET | ORAL | 0 refills | Status: DC | PRN
Start: 2020-06-25 — End: 2021-06-21

## 2020-06-25 MED ORDER — ONDANSETRON HCL 4 MG/2ML IJ SOLN
INTRAMUSCULAR | Status: AC
  Filled 2020-06-25: qty 2

## 2020-06-25 MED ORDER — CEFAZOLIN SODIUM-DEXTROSE 2-4 GM/100ML-% IV SOLN
2.0000 g | INTRAVENOUS | Status: AC
  Administered 2020-06-25: 2 g via INTRAVENOUS
  Filled 2020-06-25: qty 100

## 2020-06-25 MED ORDER — PROPOFOL 10 MG/ML IV BOLUS
INTRAVENOUS | Status: DC | PRN
  Administered 2020-06-25: 30 mg via INTRAVENOUS
  Administered 2020-06-25: 170 mg via INTRAVENOUS

## 2020-06-25 MED ORDER — OXYCODONE HCL 5 MG/5ML PO SOLN: 5.0000 mg | Freq: Once | ORAL | Status: DC | PRN

## 2020-06-25 MED ORDER — TRANEXAMIC ACID-NACL 1000-0.7 MG/100ML-% IV SOLN
1000.0000 mg | INTRAVENOUS | Status: AC
  Administered 2020-06-25: 1000 mg via INTRAVENOUS
  Filled 2020-06-25: qty 100

## 2020-06-25 MED ORDER — ROCURONIUM BROMIDE 10 MG/ML (PF) SYRINGE
PREFILLED_SYRINGE | INTRAVENOUS | Status: AC
  Filled 2020-06-25: qty 10

## 2020-06-25 MED ORDER — MIDAZOLAM HCL 2 MG/2ML IJ SOLN
INTRAMUSCULAR | Status: AC
  Filled 2020-06-25: qty 2

## 2020-06-25 MED ORDER — PROPOFOL 10 MG/ML IV BOLUS
INTRAVENOUS | Status: AC
  Filled 2020-06-25: qty 40

## 2020-06-25 MED ORDER — EPHEDRINE SULFATE-NACL 50-0.9 MG/10ML-% IV SOSY
PREFILLED_SYRINGE | INTRAVENOUS | Status: DC | PRN
  Administered 2020-06-25: 10 mg via INTRAVENOUS
  Administered 2020-06-25: 5 mg via INTRAVENOUS

## 2020-06-25 MED ORDER — STERILE WATER FOR IRRIGATION IR SOLN
Status: DC | PRN
  Administered 2020-06-25: 1000 mL

## 2020-06-25 MED ORDER — LIDOCAINE 2% (20 MG/ML) 5 ML SYRINGE
INTRAMUSCULAR | Status: AC
  Filled 2020-06-25: qty 5

## 2020-06-25 MED ORDER — LACTATED RINGERS IV SOLN: INTRAVENOUS | Status: DC

## 2020-06-25 MED ORDER — LIDOCAINE 2% (20 MG/ML) 5 ML SYRINGE
INTRAMUSCULAR | Status: DC | PRN
  Administered 2020-06-25: 40 mg via INTRAVENOUS

## 2020-06-25 MED ORDER — PROMETHAZINE HCL 25 MG/ML IJ SOLN
INTRAMUSCULAR | Status: AC
  Filled 2020-06-25: qty 1

## 2020-06-25 MED ORDER — SUGAMMADEX SODIUM 200 MG/2ML IV SOLN
INTRAVENOUS | Status: DC | PRN
  Administered 2020-06-25: 200 mg via INTRAVENOUS

## 2020-06-25 MED ORDER — PHENYLEPHRINE HCL-NACL 10-0.9 MG/250ML-% IV SOLN
INTRAVENOUS | Status: DC | PRN
  Administered 2020-06-25: 20 ug/min via INTRAVENOUS

## 2020-06-25 MED ORDER — VANCOMYCIN HCL 1000 MG IV SOLR
INTRAVENOUS | Status: DC | PRN
  Administered 2020-06-25: 1000 mg via TOPICAL

## 2020-06-25 MED ORDER — BUPIVACAINE HCL (PF) 0.5 % IJ SOLN
INTRAMUSCULAR | Status: DC | PRN
  Administered 2020-06-25: 15 mL via PERINEURAL

## 2020-06-25 MED ORDER — FENTANYL CITRATE (PF) 250 MCG/5ML IJ SOLN
INTRAMUSCULAR | Status: DC | PRN
  Administered 2020-06-25 (×2): 50 ug via INTRAVENOUS

## 2020-06-25 MED ORDER — PROMETHAZINE HCL 25 MG/ML IJ SOLN
6.2500 mg | INTRAMUSCULAR | Status: DC | PRN
  Administered 2020-06-25: 6.25 mg via INTRAVENOUS

## 2020-06-25 SURGICAL SUPPLY — 66 items
AID PSTN UNV HD RSTRNT DISP (MISCELLANEOUS)
BAG SPEC THK2 15X12 ZIP CLS (MISCELLANEOUS) ×1
BAG ZIPLOCK 12X15 (MISCELLANEOUS) ×3 IMPLANT
BLADE SAW SGTL 18X1.27X75 (BLADE) ×2 IMPLANT
BLADE SAW SGTL 18X1.27X75MM (BLADE) ×1
BLADE SURG SZ10 CARB STEEL (BLADE) ×6 IMPLANT
BRUSH FEMORAL CANAL (MISCELLANEOUS) ×1 IMPLANT
CALIBRATOR GLENOID VIP 5-D (SYSTAGENIX WOUND MANAGEMENT) ×2 IMPLANT
CEMENT BONE DEPUY (Cement) ×4 IMPLANT
CLOSURE WOUND 1/2 X4 (GAUZE/BANDAGES/DRESSINGS) ×1
COVER MAYO STAND STRL (DRAPES) ×3 IMPLANT
COVER SURGICAL LIGHT HANDLE (MISCELLANEOUS) ×3 IMPLANT
COVER WAND RF STERILE (DRAPES) IMPLANT
DECANTER SPIKE VIAL GLASS SM (MISCELLANEOUS) ×3 IMPLANT
DRAPE 3/4 80X56 (DRAPES) ×2 IMPLANT
DRAPE ORTHO SPLIT 77X108 STRL (DRAPES) ×6
DRAPE SURG 17X11 SM STRL (DRAPES) ×3 IMPLANT
DRAPE SURG ORHT 6 SPLT 77X108 (DRAPES) IMPLANT
DRAPE U-SHAPE 47X51 STRL (DRAPES) ×3 IMPLANT
DRSG AQUACEL AG ADV 3.5X10 (GAUZE/BANDAGES/DRESSINGS) ×2 IMPLANT
DRSG EMULSION OIL 3X16 NADH (GAUZE/BANDAGES/DRESSINGS) ×3 IMPLANT
ELECT BLADE TIP CTD 4 INCH (ELECTRODE) ×3 IMPLANT
FACESHIELD WRAPAROUND (MASK) ×3 IMPLANT
FACESHIELD WRAPAROUND OR TEAM (MASK) ×1 IMPLANT
GLENOID WITH CLEAT SM (Miscellaneous) ×2 IMPLANT
GLOVE BIO SURGEON STRL SZ7.5 (GLOVE) ×6 IMPLANT
GLOVE BIOGEL PI IND STRL 8 (GLOVE) IMPLANT
GLOVE BIOGEL PI INDICATOR 8 (GLOVE)
GOWN STRL REUS W/TWL LRG LVL3 (GOWN DISPOSABLE) ×3 IMPLANT
HANDPIECE INTERPULSE COAX TIP (DISPOSABLE) ×3
HEAD HUMERAL ECLIPSE 37/16 (Shoulder) ×2 IMPLANT
IMPL ECLIPSE SPEEDCAP (Shoulder) IMPLANT
IMPLANT ECLIPSE SPEEDCAP (Shoulder) ×3 IMPLANT
KIT TURNOVER KIT A (KITS) IMPLANT
MANIFOLD NEPTUNE II (INSTRUMENTS) ×3 IMPLANT
NDL MAYO CATGUT SZ4 TPR NDL (NEEDLE) ×1 IMPLANT
NEEDLE MAYO CATGUT SZ4 (NEEDLE) IMPLANT
PACK SHOULDER (CUSTOM PROCEDURE TRAY) ×3 IMPLANT
PENCIL SMOKE EVACUATOR (MISCELLANEOUS) IMPLANT
PIN NITINOL TARGETER 2.8 (PIN) ×2 IMPLANT
PROTECTOR NERVE ULNAR (MISCELLANEOUS) ×3 IMPLANT
RESTRAINT HEAD UNIVERSAL NS (MISCELLANEOUS) IMPLANT
SCREW SM FOR ECLIPSE CAGE 30 (Screw) ×2 IMPLANT
SET HNDPC FAN SPRY TIP SCT (DISPOSABLE) ×1 IMPLANT
SIZER ECLIPSE CAGE SCREW (ORTHOPEDIC DISPOSABLE SUPPLIES) ×2 IMPLANT
SLING ARM FOAM STRAP LRG (SOFTGOODS) ×3 IMPLANT
SLING ARM FOAM STRAP MED (SOFTGOODS) ×1 IMPLANT
SMARTMIX MINI TOWER (MISCELLANEOUS)
SPONGE LAP 18X18 RF (DISPOSABLE) IMPLANT
STRIP CLOSURE SKIN 1/2X4 (GAUZE/BANDAGES/DRESSINGS) ×2 IMPLANT
SUCTION FRAZIER HANDLE 12FR (TUBING) ×3
SUCTION TUBE FRAZIER 12FR DISP (TUBING) ×1 IMPLANT
SUT ETHIBOND 2 (SUTURE) IMPLANT
SUT ETHIBOND NAB CT1 #1 30IN (SUTURE) ×3 IMPLANT
SUT MNCRL AB 3-0 PS2 18 (SUTURE) ×2 IMPLANT
SUT MNCRL AB 4-0 PS2 18 (SUTURE) ×1 IMPLANT
SUT VIC AB 0 CT1 36 (SUTURE) ×2 IMPLANT
SUT VIC AB 1 CT1 27 (SUTURE)
SUT VIC AB 1 CT1 27XBRD ANTBC (SUTURE) ×3 IMPLANT
SUT VIC AB 2-0 CT1 27 (SUTURE) ×6
SUT VIC AB 2-0 CT1 TAPERPNT 27 (SUTURE) ×3 IMPLANT
TOWEL OR 17X26 10 PK STRL BLUE (TOWEL DISPOSABLE) ×6 IMPLANT
TOWEL OR NON WOVEN STRL DISP B (DISPOSABLE) ×3 IMPLANT
TOWER CARTRIDGE SMART MIX (DISPOSABLE) ×1 IMPLANT
TOWER SMARTMIX MINI (MISCELLANEOUS) IMPLANT
TRUNION ECLIPSE 37 (Miscellaneous) ×2 IMPLANT

## 2020-06-25 NOTE — Anesthesia Postprocedure Evaluation (Signed)
Anesthesia Post Note  Patient: Melissa [name]  Procedure(s) Performed: TOTAL SHOULDER ARTHROPLASTY (Right Shoulder)     Patient location during evaluation: PACU Anesthesia Type: General Level of consciousness: awake and alert Pain management: pain level controlled Vital Signs Assessment: post-procedure vital signs reviewed and stable Respiratory status: spontaneous breathing, nonlabored ventilation, respiratory function stable and patient connected to nasal cannula oxygen Cardiovascular status: blood pressure returned to baseline and stable Postop Assessment: no apparent nausea or vomiting Anesthetic complications: no   No complications documented.  Last Vitals:  Vitals:   06/25/20 1030 06/25/20 1045  BP: 136/70 134/75  Pulse: 88 91  Resp: 18 17  Temp:    SpO2: 98% 93%    Last Pain:  Vitals:   06/25/20 1030  TempSrc:   PainSc: 0-No pain                 Kelley Knoth S

## 2020-06-25 NOTE — Discharge Instructions (Signed)
-  Maintain postoperative sling at all times.  You are nonweightbearing to the right upper extremity.  You may remove the sling while you are awake to move the elbow, hand and wrist as tolerated.  -Maintain postoperative bandage until your follow-up appointment.  This bandage is waterproof and you may shower with this in place.  Do not submerge underwater.  -Apply ice to the right shoulder at all times when you are awake.  This will produce postoperative swelling and pain.  -For mild to moderate pain use Tylenol and Advil around-the-clock.  For breakthrough pain use oxycodone as necessary.  -Return to see Dr. Aundria Rud in 2 weeks for routine postop check.

## 2020-06-25 NOTE — Progress Notes (Signed)
Occupational Therapy Evaluation  s/p shoulder replacement without functional use of right dominant upper extremity secondary to effects of surgery and interscalene block and shoulder precautions. Therapist provided education and instruction to patient and friend in regards to exercises, precautions, positioning, donning upper extremity clothing and bathing while maintaining shoulder precautions, ice and edema management and donning/doffing sling. Patient and friend verbalized understanding and demonstrated as needed. Patient needed assistance to donn shirt, pants, and shoes and provided with instruction on compensatory strategies to perform ADLs. Patient to follow up with MD for further therapy needs.     06/25/20 1200  OT Visit Information  Last OT Received On 06/25/20  Assistance Needed +1  History of Present Illness Patient is a 54 year old female admitted for R total shoulder arthroplasty. PMH includes not limited partial knee arthroplasty, carpel tunnel release, arthritis, back pain  Precautions  Precautions Shoulder  Type of Shoulder Precautions no active/passive ROM shoulder, AROM elbow wrist and hand ok  Shoulder Interventions Shoulder sling/immobilizer;Off for dressing/bathing/exercises  Precaution Booklet Issued Yes (comment)  Required Braces or Orthoses Sling  Restrictions  Weight Bearing Restrictions Yes  RUE Weight Bearing NWB  Home Living  Family/patient expects to be discharged to: Private residence  Living Arrangements Alone  Available Help at Discharge Friend(s);Available 24 hours/day  Type of Home House  Home Access Stairs to enter  Entrance Stairs-Number of Steps 4  Entrance Stairs-Rails Left;Right  Home Layout One level  Bathroom Shower/Tub Walk-in shower  Bathroom Toilet Handicapped height  Home Equipment None  Additional Comments friend plans to stay first night "then we'll see what's needed"  Prior Function  Level of Independence Independent  Communication   Communication No difficulties  Pain Assessment  Pain Assessment No/denies pain  Cognition  Arousal/Alertness Awake/alert  Behavior During Therapy WFL for tasks assessed/performed  Overall Cognitive Status Within Functional Limits for tasks assessed  Upper Extremity Assessment  Upper Extremity Assessment RUE deficits/detail  RUE Deficits / Details + nerve block  Lower Extremity Assessment  Lower Extremity Assessment Overall WFL for tasks assessed  Cervical / Trunk Assessment  Cervical / Trunk Assessment Normal  ADL  Overall ADL's  Needs assistance/impaired  Grooming Set up;Sitting  Upper Body Bathing Minimal assistance;Sitting  Lower Body Bathing Sit to/from stand;Sitting/lateral leans;Min guard  Upper Body Dressing  Minimal assistance;Sitting;Cueing for compensatory techniques  Upper Body Dressing Details (indicate cue type and reason) min A to thread R UE due to numbness, patient able to complete remainder of task  Lower Body Dressing Min guard;Sitting/lateral leans;Sit to/from stand;Cueing for compensatory techniques  Lower Body Dressing Details (indicate cue type and reason) patient able to don shorts, min G assist in standing for safety  Toilet Transfer Min guard;Ambulation  Toileting- IT trainer Min guard;Sit to/from stand;Sitting/lateral lean  Functional mobility during ADLs Min guard  General ADL Comments patient and friend educated in compensatory strategies for self care tasks while adhering to shoulder precautions  Bed Mobility  General bed mobility comments in chair  Transfers  Overall transfer level Needs assistance  Equipment used None  Transfers Sit to/from Stand  Sit to Stand Min guard  General transfer comment mild unsteadiness  Balance  Overall balance assessment Mild deficits observed, not formally tested  Exercises  Exercises Shoulder  Shoulder Instructions  Donning/doffing shirt without moving shoulder Minimal assistance;Caregiver  independent with task;Patient able to independently direct caregiver  Method for sponge bathing under operated UE Minimal assistance;Caregiver independent with task;Patient able to independently direct caregiver  Donning/doffing  sling/immobilizer Moderate assistance;Caregiver independent with task;Patient able to independently direct caregiver  Correct positioning of sling/immobilizer Caregiver independent with task;Patient able to independently direct caregiver  Pendulum exercises (written home exercise program)  (N/A)  ROM for elbow, wrist and digits of operated UE Patient able to independently direct caregiver;Caregiver independent with task  Sling wearing schedule (on at all times/off for ADL's) Caregiver independent with task;Patient able to independently direct caregiver;Independent  Proper positioning of operated UE when showering Caregiver independent with task;Patient able to independently direct caregiver  Positioning of UE while sleeping Caregiver independent with task;Patient able to independently direct caregiver  OT - End of Session  Equipment Utilized During Treatment  (sling)  Activity Tolerance Patient tolerated treatment well  Patient left in chair;with call bell/phone within reach;with family/visitor present  Nurse Communication Mobility status  OT Assessment  OT Recommendation/Assessment Progress rehab of shoulder as ordered by MD at follow-up appointment  OT Visit Diagnosis Pain  Pain - Right/Left Right  Pain - part of body Shoulder  OT Problem List Impaired UE functional use;Pain  AM-PAC OT "6 Clicks" Daily Activity Outcome Measure (Version 2)  Help from another person eating meals? 3  Help from another person taking care of personal grooming? 3  Help from another person toileting, which includes using toliet, bedpan, or urinal? 3  Help from another person bathing (including washing, rinsing, drying)? 3  Help from another person to put on and taking off regular upper  body clothing? 3  Help from another person to put on and taking off regular lower body clothing? 3  6 Click Score 18  OT Recommendation  Follow Up Recommendations Follow surgeon's recommendation for DC plan and follow-up therapies  OT Equipment None recommended by OT  Acute Rehab OT Goals  Patient Stated Goal to sleep  OT Goal Formulation With patient  OT Time Calculation  OT Start Time (ACUTE ONLY) 1200  OT Stop Time (ACUTE ONLY) 1220  OT Time Calculation (min) 20 min  OT General Charges  $OT Visit 1 Visit  OT Evaluation  $OT Eval Low Complexity 1 Low  Written Expression  Dominant Hand Right   Marlyce Huge OT OT pager: [phone]

## 2020-06-25 NOTE — Anesthesia Procedure Notes (Signed)
Anesthesia Procedure Image    

## 2020-06-25 NOTE — Op Note (Signed)
Date of Surgery: 06/25/2020  INDICATIONS: Melissa [name] is a 54 y.o.-year-old female with a right shoulder arthritis;  The patient did consent to the procedure after discussion of the risks and benefits.  PREOPERATIVE DIAGNOSIS:  1.  Right shoulder osteoarthritis  POSTOPERATIVE DIAGNOSIS: Same.  PROCEDURE:  1.  Right total shoulder arthroplasty 2.  Right shoulder transfer of long head of the biceps tendon to the pectoralis major tendon  SURGEON: Geralynn Rile, M.D.  ASSIST: Melissa [name] ,PA-C.  Assistant attestation: PA Mcclung was necessary throughout the duration of the procedure to assist with positioning the patient, safely prepping and draping the extremity as well as the approach and placement of deep retractors.  Also utilized for implantation of the final implants as well as safe and expeditious closure of the wound.  ANESTHESIA:  general, and an interscalene block with Exparel   IV FLUIDS AND URINE: See anesthesia.  ESTIMATED BLOOD LOSS: 200 mL.  IMPLANTS: Arthrex Eclipse humeral head with a small polyethylene glenoid.  Size 37 trunnion with a 37x 17 humeral head. Arthrex speed scap suture repair kit for the subscapularis repair with 3 Medial Row fiber tack anchors and 2 lateral row swivel lock anchors.  DRAINS: None  COMPLICATIONS: None.  DESCRIPTION OF PROCEDURE: After obtaining informed consent,The patient was brought to the operating room and placed supine on the operating table.  The patient had been signed prior to the procedure and this was documented. The patient had the anesthesia placed by the anesthesiologist.  A time-out was performed to confirm that this was the correct patient, site, side and location. The patient did receive antibiotics prior to the incision and was re-dosed during the procedure as needed at indicated intervals.   The patient had the operative extremity prepped and draped in the standard surgical fashion.   The head and neck were nicely  stabilized.   A 10 blade was used to make a standard deltopectoral approach to the shoulder.  Dissection was carried out through the subcutaneous tissue to where the deltopectoral interval was visualized and developed.  The cephalic vein was mobilized and retracted laterally for the duration of the case.  The subacromial space was cleared bluntly retractors were placed appropriately deep to the pectoralis major tendon and deep to the deltoid muscle fascia.  The upper one third of the pectoralis muscle insertion on the humerus was sharply released.  No rotator cuff tears were noted.  Next we opened the biceps tendon sheath.  We identified the long head of the biceps which was quite diseased and swollen.  We then performed a transfer of the long head of the biceps tendon to the upper border of the pectoralis major tendon.  We did this with a suture tape figure-of-eight x2.  We then released the biceps tendon proximal to the transfer.  Next I performed a subscapularis takedown and a peel fashion.  Subscapularis, rotator interval, and the inferior capsule were all released.  There were inferior humeral osteophytes that were removed with rongeur and curved osteotome.  After, Releases, a humeral head osteotomy was performed in 30 of retroversion and at 135 head neck angle.  A protractor was placed within the glenoid vault to protect axillary nerve and posterior capsular structures.  Next we prepared the humerus.  We used the sizing guide and found that the 37 mm trunnion was the appropriate size.  We then placed the central reamer.  We then drilled to a size small cage screw.  We then turned our  attention to the labrum and glenoid side.  Retractors were placed anteriorly, superiorly, and posteriorly and the labrum was excised.  Exposure of the glenoid was obtained.  The center drill bit was used followed by sequential reaming.  Next using the pegged glenoid guide the holes were drilled appropriately.  The  small size glenoid proved to be the appropriate size for this glenoid.  Next the holes were drilled and chiseled appropriately.  Glenoid  was washed with antibiotic irrigation.  Next glenoid vault was dried.  We trialed once again and were satisfied with the small sized the glenoid.  Next, using antibodies cementing the superior and inferior holes the glenoid was cemented into place.  Of note, the center ball-like peg was filled with humeral head autograft and not cemented per the manufacturer's recommendation.  Excellent fixation of the glenoid was obtained.   We then turned our attention back to the humeral side.  The humeral stem was found to still be rotationally stable and the final stem was opened at the above size.  We found that the size 37x16 humeral head was appropriate.  Prior to placing the final implant we did place 3 fiber tack anchors into the lesser tuberosity.  These were loaded with fiber tape sutures.  We then passed of these off to the side for later repair.  We then impacted the final head implant onto the trunnion this had excellent purchase.  We then reduced the shoulder.  He had nice 50% pushback in neutral rotation and 30 degrees of abduction.  Lastly we turned our attention to the subscapularis repair.  The shoulder was copiously irrigated once again with antibiotic solution.  We used a free needle to pass the corresponding sutures from our 3 Medial Row anchors through the medial aspect of the subscapularis tendon.  We started inferiorly and then worked our way superiorly for a total of 3 passes with 6 suture limbs.  We then loaded 3 of the suture limbs into a 3.9 mm swivel lock anchor.  We drilled a superior pilot hole into the biceps groove.  We then did the same with the remaining 3 suture limbs as well as the 2 suture limbs from our traction stitch which was previously placed into the inferior swivel lock anchor.  Another pilot hole was drilled the inferior position of the biceps  groove.  Prior to reducing the tendon back to bone we did place a figure-of-eight tension stitch into the lateral aspect of the rotator interval.  This brought our subscapularis out to anatomic length.  We then sequentially placed the superior and inferior swivel lock anchors with excellent purchase.  At the completion of the repair we did place 1 more figure-of-eight suture into the lateral aspect of the rotator interval.  The repair was solid and moved as a unit.  Next we turned our attention to closing.  We irrigated once again copiously with normal saline.  We then placed 1 g of vancomycin powder into the deltopectoral interval.  Closed the deltopectoral interval with an 0 Vicryl.  Deep fatty layer was likewise closed with 0 Vicryl.  We then closed the deep dermal layer with 2-0 Vicryl followed by a running subcuticular 3-0 Monocryl for skin.  The skin was cleaned and dried and Steri-Strips were applied as well as an Aquacel dressing.  The patient tolerated the procedure well.  All counts were correct 2.  There were no intraoperative complications.  The patient was transferred to PACU in stable condition.  Disposition: The patient will be nonweightbearing to the operative extremity for proximally 4-6 weeks.  He will begin physical therapy in the outpatient setting.  He will be admitted for routine postoperative care including antibiotics, pain control, and DVT prophylaxis.  Sling will be maintained at all times.  Return for wound check in approximately 2 weeks and then again at 6 weeks.

## 2020-06-25 NOTE — Anesthesia Procedure Notes (Signed)
Anesthesia Regional Block: Interscalene brachial plexus block   Pre-Anesthetic Checklist: ,, timeout performed, Correct Patient, Correct Site, Correct Laterality, Correct Procedure, Correct Position, site marked, Risks and benefits discussed,  Surgical consent,  Pre-op evaluation,  At surgeon's request and post-op pain management  Laterality: Right  Prep: chloraprep       Needles:  Injection technique: Single-shot  Needle Type: Echogenic Needle     Needle Length: 9cm      Additional Needles:   Procedures:,,,, ultrasound used (permanent image in chart),,,,  Narrative:  Start time: 06/25/2020 6:48 AM End time: 06/25/2020 6:56 AM Injection made incrementally with aspirations every 5 mL.  Performed by: Personally  Anesthesiologist: Eilene Ghazi, MD  Additional Notes: Patient tolerated the procedure well without complications

## 2020-06-25 NOTE — Transfer of Care (Signed)
Immediate Anesthesia Transfer of Care Note  Patient: Melissa [name]  Procedure(s) Performed: TOTAL SHOULDER ARTHROPLASTY (Right Shoulder)  Patient Location: PACU  Anesthesia Type:General  Level of Consciousness: awake, drowsy and patient cooperative  Airway & Oxygen Therapy: Patient Spontanous Breathing and Patient connected to face mask oxygen  Post-op Assessment: Report given to RN and Post -op Vital signs reviewed and stable  Post vital signs: Reviewed and stable  Last Vitals:  Vitals Value Taken Time  BP 147/76 06/25/20 1008  Temp    Pulse 90 06/25/20 1009  Resp 23 06/25/20 1009  SpO2 98 % 06/25/20 1009  Vitals shown include unvalidated device data.  Last Pain:  Vitals:   06/25/20 0607  TempSrc: Oral  PainSc:       Patients Stated Pain Goal: 4 (06/25/20 0542)  Complications: No complications documented.

## 2020-06-25 NOTE — Brief Op Note (Signed)
06/25/2020  9:56 AM  PATIENT:  Micheal Likens  54 y.o. female  PRE-OPERATIVE DIAGNOSIS:  Right shoulder osteoarthritis  POST-OPERATIVE DIAGNOSIS:  Right shoulder osteoarthritis  PROCEDURE:  Procedure(s) with comments: TOTAL SHOULDER ARTHROPLASTY (Right) - 2.5 hrs  SURGEON:  Surgeon(s) and Role:    * Yolonda Kida, MD - Primary  PHYSICIAN ASSISTANT: Dion Saucier, PA-C.   ANESTHESIA:   regional  EBL:  200 mL   BLOOD ADMINISTERED:none  DRAINS: none   LOCAL MEDICATIONS USED:  NONE  SPECIMEN:  No Specimen  DISPOSITION OF SPECIMEN:  N/A  COUNTS:  YES  TOURNIQUET:  * No tourniquets in log *  DICTATION: .Note written in EPIC  PLAN OF CARE: Discharge to home after PACU  PATIENT DISPOSITION:  PACU - hemodynamically stable.   Delay start of Pharmacological VTE agent (>24hrs) due to surgical blood loss or risk of bleeding: not applicable

## 2020-06-25 NOTE — Anesthesia Procedure Notes (Signed)
Procedure Name: Intubation Date/Time: 06/25/2020 7:35 AM Performed by: Eben Burow, CRNA Pre-anesthesia Checklist: Patient identified, Emergency Drugs available, Suction available, Patient being monitored and Timeout performed Patient Re-evaluated:Patient Re-evaluated prior to induction Oxygen Delivery Method: Circle system utilized Preoxygenation: Pre-oxygenation with 100% oxygen Induction Type: IV induction Ventilation: Mask ventilation without difficulty Laryngoscope Size: Mac and 4 Grade View: Grade I Tube type: Oral Tube size: 7.0 mm Number of attempts: 1 Airway Equipment and Method: Stylet Placement Confirmation: ETT inserted through vocal cords under direct vision,  positive ETCO2 and breath sounds checked- equal and bilateral Secured at: 22 cm Tube secured with: Tape Dental Injury: Teeth and Oropharynx as per pre-operative assessment

## 2020-06-25 NOTE — H&P (Signed)
ORTHOPAEDIC H and P  REQUESTING PHYSICIAN: Yolonda Kida, MD  PCP:  Elizabeth Palau, FNP  Chief Complaint: Right shoulder OA  HPI: Melissa [name] is a 54 y.o. female who complains of chronic right shoulder pain.  She has end-stage osteoarthritis and is here today for total shoulder arthroplasty.  We have previously discussed this in the office at length.  She is here today for surgery.  No new complaints at this time.  Past Medical History:  Diagnosis Date  . Allergic rhinitis   . Anxiety   . Arthritis   . Back pain   . Constipated   . Depression   . Diaphragmatic hernia   . Fatty liver   . GERD (gastroesophageal reflux disease)   . Hypertension   . Hypothyroidism   . IBS (irritable bowel syndrome)   . Joint pain   . Kidney problem   . Lactose intolerance   . Morbid obesity (HCC)   . OSA (obstructive sleep apnea)   . Osteoarthritis   . Seasonal allergies   . Tinnitus   . Vitamin D deficiency    Past Surgical History:  Procedure Laterality Date  . CARPAL TUNNEL RELEASE     rt/lt  . FINGER ARTHRODESIS  11/08/2011   Procedure: ARTHRODESIS FINGER;  Surgeon: Wyn Forster., MD;  Location: [facility];  Service: Orthopedics;  Laterality: Left;  left thumb carpal-metacarpal fusion  . KNEE ARTHROSCOPY     both knees  . PARTIAL KNEE ARTHROPLASTY  11/05/2012   Procedure: UNICOMPARTMENTAL KNEE;  Surgeon: [name], MD;  Location: [facility];  Service: Orthopedics;  Laterality: Right;  . TONSILLECTOMY    . URETER SURGERY     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-little lt kidney function, has one functioning kidney  . WISDOM TOOTH EXTRACTION     Social History   Socioeconomic History  . Marital status: Single    Spouse name: Not on file  . Number of children: Not on file  . Years of education: Not on file  . Highest education level: Not on file  Occupational History  . Occupation: [employer]  Tobacco Use  . Smoking status: Never Smoker  .  Smokeless tobacco: Never Used  Vaping Use  . Vaping Use: Never used  Substance and Sexual Activity  . Alcohol use: No    Comment: Quit 13 years ago  . Drug use: Never  . Sexual activity: Not on file  Other Topics Concern  . Not on file  Social History Narrative   Lives home alone, (has dogs and cats).  Disabled, due to back.  Education 2 college degree's.  Coffee/ sodas 2-3 daily   Social Determinants of Health   Financial Resource Strain:   . Difficulty of Paying Living Expenses: Not on file  Food Insecurity:   . Worried About Programme researcher, broadcasting/film/video in the Last Year: Not on file  . Ran Out of Food in the Last Year: Not on file  Transportation Needs:   . Lack of Transportation (Medical): Not on file  . Lack of Transportation (Non-Medical): Not on file  Physical Activity:   . Days of Exercise per Week: Not on file  . Minutes of Exercise per Session: Not on file  Stress:   . Feeling of Stress : Not on file  Social Connections:   . Frequency of Communication with Friends and Family: Not on file  . Frequency of Social Gatherings with Friends and Family: Not on file  .  Attends Religious Services: Not on file  . Active Member of Clubs or Organizations: Not on file  . Attends Banker Meetings: Not on file  . Marital Status: Not on file   Family History  Problem Relation Age of Onset  . Arthritis Mother        spianl stenosis  . Atrial fibrillation Mother   . Transient ischemic attack Mother   . High Cholesterol Mother   . Diabetes Father   . Heart attack Father        3 stents  . Heart disease Father   . Atrial fibrillation Sister    Allergies  Allergen Reactions  . Bee Venom Hives   Prior to Admission medications   Medication Sig Start Date End Date Taking? Authorizing Provider  acetaminophen (TYLENOL) 500 MG tablet Take 500-1,000 mg by mouth every 8 (eight) hours as needed (pain.).    Yes [provider]  ascorbic acid (VITAMIN C) 500 MG tablet  Take 1,500 mg by mouth at bedtime.   Yes [provider]  aspirin EC 81 MG tablet Take 81 mg by mouth daily.   Yes [provider]  B Complex-C (B-COMPLEX WITH VITAMIN C) tablet Take 1 tablet by mouth at bedtime.    Yes [provider]  baclofen (LIORESAL) 10 MG tablet Take 10 mg by mouth at bedtime.   Yes [provider]  buPROPion (WELLBUTRIN XL) 300 MG 24 hr tablet Take 300 mg by mouth daily. 05/14/20  Yes [provider]  busPIRone (BUSPAR) 5 MG tablet Take 5 mg by mouth 2 (two) times daily as needed for anxiety. 06/15/20  Yes [provider]  chlorpheniramine (CHLOR-TRIMETON) 4 MG tablet Take 4 mg by mouth daily.   Yes [provider]  Cholecalciferol (VITAMIN D) 2000 UNITS tablet Take 2,000 Units by mouth at bedtime.    Yes [provider]  diphenhydrAMINE (BENADRYL) 25 mg capsule Take 1 capsule (25 mg total) by mouth every 6 (six) hours as needed for itching, allergies or sleep. 11/06/12  Yes Babish, Molli Hazard, PA-C  FLUoxetine (PROZAC) 20 MG capsule Take 20 mg by mouth daily. 03/13/15  Yes [provider]  HYDROcodone-acetaminophen (NORCO/VICODIN) 5-325 MG tablet Take 1 tablet by mouth at bedtime.    Yes [provider]  levonorgestrel (MIRENA) 20 MCG/24HR IUD 1 each by Intrauterine route once.   Yes [provider]  levothyroxine (SYNTHROID) 150 MCG tablet Take 150 mcg by mouth every Tuesday, Thursday, Saturday, and Sunday. In the morning 05/13/20  Yes [provider]  levothyroxine (SYNTHROID, LEVOTHROID) 175 MCG tablet Take 175 mcg by mouth every Monday, Wednesday, and Friday. In the morning   Yes [provider]  montelukast (SINGULAIR) 10 MG tablet Take 10 mg by mouth at bedtime.   Yes [provider]  omeprazole (PRILOSEC) 40 MG capsule Take 40 mg by mouth at bedtime.    Yes [provider]  pravastatin (PRAVACHOL) 40 MG tablet Take 40 mg by mouth daily.    Yes  [provider]  tiZANidine (ZANAFLEX) 4 MG tablet Take 4 mg by mouth 2 (two) times daily as needed for muscle spasms.    Yes [provider]  triamcinolone cream (KENALOG) 0.1 % Apply 1 application topically 2 (two) times daily as needed (rash).  04/23/12  Yes [provider]  EPINEPHrine (EPIPEN 2-PAK) 0.3 mg/0.3 mL IJ SOAJ injection Inject 0.3 mg into the muscle once as needed (allergic reaction).  04/23/13  [provider]   No results found.  Positive ROS: All other systems have been reviewed and were otherwise negative with the exception of those mentioned in the HPI and as above.  Physical Exam: General: Alert, no acute distress Cardiovascular: No pedal edema Respiratory: No cyanosis, no use of accessory musculature GI: No organomegaly, abdomen is soft and non-tender Skin: No lesions in the area of chief complaint Neurologic: Sensation intact distally Psychiatric: Patient is competent for consent with normal mood and affect Lymphatic: No axillary or cervical lymphadenopathy  MUSCULOSKELETAL:  Right upper extremity is warm and well-perfused.  No open wounds or lesions.  Neurovascularly intact.  Assessment: Right shoulder end-stage osteoarthritis  Plan: -Plan is to proceed today with total shoulder arthroplasty.  We again reviewed the risk and benefits of this procedure at length.  We discussed discharge home from PACU with same-day care.  She is on board with that plan as well. = Informed consent was obtained and signed by myself on the chart.    Yolonda Kida, MD Cell [phone]    06/25/2020 7:15 AM

## 2020-06-26 ENCOUNTER — Encounter (HOSPITAL_COMMUNITY): Payer: Self-pay | Admitting: Orthopedic Surgery

## 2020-07-16 ENCOUNTER — Other Ambulatory Visit: Payer: Self-pay

## 2020-07-16 ENCOUNTER — Ambulatory Visit (INDEPENDENT_AMBULATORY_CARE_PROVIDER_SITE_OTHER): Payer: 59 | Admitting: Physician Assistant

## 2020-07-16 VITALS — BP 113/75 | HR 70 | Temp 98.2°F | Ht 62.0 in | Wt 223.0 lb

## 2020-07-16 DIAGNOSIS — E559 Vitamin D deficiency, unspecified: Secondary | ICD-10-CM | POA: Diagnosis not present

## 2020-07-16 DIAGNOSIS — Z6841 Body Mass Index (BMI) 40.0 and over, adult: Secondary | ICD-10-CM | POA: Diagnosis not present

## 2020-07-16 DIAGNOSIS — Z9189 Other specified personal risk factors, not elsewhere classified: Secondary | ICD-10-CM

## 2020-07-16 DIAGNOSIS — R7303 Prediabetes: Secondary | ICD-10-CM | POA: Diagnosis not present

## 2020-07-16 MED ORDER — METFORMIN HCL 500 MG PO TABS: 500.0000 mg | ORAL_TABLET | Freq: Every day | ORAL | 0 refills | Status: DC

## 2020-07-20 NOTE — Progress Notes (Signed)
Chief Complaint:   OBESITY Melissa [name] is here to discuss her progress with her obesity treatment plan along with follow-up of her obesity related diagnoses. Melissa [name] is on the Category 2 Plan +100 protein calories and states she is following her eating plan approximately 85% of the time. Melissa [name] states she is exercising for 0 minutes 0 times per week.  Today's visit was #: 14 Starting weight: 256 lbs Starting date: 11/18/2019 Today's weight: 223 lbs Today's date: 07/15/2020 Total lbs lost to date: 33 lbs Total lbs lost since last in-office visit: 2 lbs  Interim History: [name] had right shoulder surgery 3 weeks ago and is doing well.  She started PT yesterday.  She states that she has been doing some comfort eating.  She has not been as hungry.  Subjective:   1. Vitamin D deficiency Melissa [name]'s Vitamin D level was 49.1 on 06/18/2020. She is currently taking OTC vitamin D 2,000 IU each day. She denies nausea, vomiting or muscle weakness.  Participates in outside activities often.    2. Prediabetes Melissa [name] has a diagnosis of prediabetes based on her elevated HgA1c and was informed this puts her at greater risk of developing diabetes. She continues to work on diet and exercise to decrease her risk of diabetes. She denies nausea or hypoglycemia.  Previous A1c was 5.7.  Now 5.9.  No medication currently.  Hunger is controlled.  Lab Results  Component Value Date   HGBA1C 5.9 (H) 06/18/2020   Lab Results  Component Value Date   INSULIN 12.9 06/18/2020   INSULIN 23.2 11/18/2019   3. At risk for diabetes mellitus Melissa [name] is at higher than average risk for developing diabetes due to her obesity.   Assessment/Plan:   1. Vitamin D deficiency Low Vitamin D level contributes to fatigue and are associated with obesity, breast, and colon cancer. She agrees to increase her OTC Vitamin D to 4,000 IU daily and will follow-up for routine testing of Vitamin D, at least 2-3 times per year to avoid  over-replacement.    2. Prediabetes Melissa [name] will continue to work on weight loss, exercise, and decreasing simple carbohydrates to help decrease the risk of diabetes.  Start metformin 500 mg daily with breakfast, as per below.  -Start metFORMIN (GLUCOPHAGE) 500 MG tablet; Take 1 tablet (500 mg total) by mouth daily.  Dispense: 30 tablet; Refill: 0  3. At risk for diabetes mellitus Melissa [name] was given approximately 15 minutes of diabetes education and counseling today. We discussed intensive lifestyle modifications today with an emphasis on weight loss as well as increasing exercise and decreasing simple carbohydrates in her diet. We also reviewed medication options with an emphasis on risk versus benefit of those discussed.   Repetitive spaced learning was employed today to elicit superior memory formation and behavioral change.  4. Class 3 severe obesity with serious comorbidity and body mass index (BMI) of 40.0 to 44.9 in adult, unspecified obesity type (HCC) Melissa [name] is currently in the action stage of change. As such, her goal is to continue with weight loss efforts. She has agreed to the Category 2 Plan +100 protein calories.   Exercise goals: For substantial health benefits, adults should do at least 150 minutes (2 hours and 30 minutes) a week of moderate-intensity, or 75 minutes (1 hour and 15 minutes) a week of vigorous-intensity aerobic physical activity, or an equivalent combination of moderate- and vigorous-intensity aerobic activity. Aerobic activity should be performed in episodes of at least 10 minutes, and preferably, it  should be spread throughout the week.  Behavioral modification strategies: increasing lean protein intake and meal planning and cooking strategies.  Melissa [name] has agreed to follow-up with our clinic in 2 weeks. She was informed of the importance of frequent follow-up visits to maximize her success with intensive lifestyle modifications for her multiple health conditions.    Objective:   Blood pressure 113/75, pulse 70, temperature 98.2 F (36.8 C), height 5\' 2"  (1.575 m), weight 223 lb (101.2 kg), SpO2 96 %. Body mass index is 40.79 kg/m.  General: Cooperative, alert, well developed, in no acute distress. HEENT: Conjunctivae and lids unremarkable. Cardiovascular: Regular rhythm.  Lungs: Normal work of breathing. Neurologic: No focal deficits.   Lab Results  Component Value Date   CREATININE 0.97 06/18/2020   BUN 16 06/18/2020   NA 142 06/18/2020   K 4.8 06/18/2020   CL 103 06/18/2020   CO2 23 06/18/2020   Lab Results  Component Value Date   ALT 17 06/18/2020   AST 17 06/18/2020   ALKPHOS 108 06/18/2020   BILITOT 0.3 06/18/2020   Lab Results  Component Value Date   HGBA1C 5.9 (H) 06/18/2020   Lab Results  Component Value Date   INSULIN 12.9 06/18/2020   INSULIN 23.2 11/18/2019   Lab Results  Component Value Date   TSH 3.910 06/18/2020   Lab Results  Component Value Date   WBC 8.1 06/19/2020   HGB 13.2 06/19/2020   HCT 42.4 06/19/2020   MCV 88.3 06/19/2020   PLT 374 06/19/2020   Attestation Statements:   Reviewed by clinician on day of visit: allergies, medications, problem list, medical history, surgical history, family history, social history, and previous encounter notes.  I, 06/21/2020, CMA, am acting as transcriptionist for Insurance claims handler, PA-C  I have reviewed the above documentation for accuracy and completeness, and I agree with the above. [name], PA-C

## 2020-07-24 DIAGNOSIS — M25611 Stiffness of right shoulder, not elsewhere classified: Secondary | ICD-10-CM | POA: Diagnosis not present

## 2020-07-24 DIAGNOSIS — M25511 Pain in right shoulder: Secondary | ICD-10-CM | POA: Diagnosis not present

## 2020-07-29 DIAGNOSIS — M25611 Stiffness of right shoulder, not elsewhere classified: Secondary | ICD-10-CM | POA: Diagnosis not present

## 2020-07-29 DIAGNOSIS — M25511 Pain in right shoulder: Secondary | ICD-10-CM | POA: Diagnosis not present

## 2020-07-30 ENCOUNTER — Other Ambulatory Visit: Payer: Self-pay

## 2020-07-30 ENCOUNTER — Ambulatory Visit (INDEPENDENT_AMBULATORY_CARE_PROVIDER_SITE_OTHER): Payer: Medicare HMO | Admitting: Physician Assistant

## 2020-07-30 ENCOUNTER — Encounter (INDEPENDENT_AMBULATORY_CARE_PROVIDER_SITE_OTHER): Payer: Self-pay | Admitting: Physician Assistant

## 2020-07-30 VITALS — BP 109/71 | HR 78 | Temp 98.3°F | Ht 62.0 in | Wt 220.0 lb

## 2020-07-30 DIAGNOSIS — Z9989 Dependence on other enabling machines and devices: Secondary | ICD-10-CM

## 2020-07-30 DIAGNOSIS — Z6841 Body Mass Index (BMI) 40.0 and over, adult: Secondary | ICD-10-CM

## 2020-07-30 DIAGNOSIS — E038 Other specified hypothyroidism: Secondary | ICD-10-CM

## 2020-07-30 DIAGNOSIS — G4733 Obstructive sleep apnea (adult) (pediatric): Secondary | ICD-10-CM | POA: Diagnosis not present

## 2020-07-30 NOTE — Progress Notes (Signed)
Chief Complaint:   OBESITY Melissa [name] is here to discuss her progress with her obesity treatment plan along with follow-up of her obesity related diagnoses. Melissa [name] is on the Category 2 Plan + 100 protein calories and states she is following her eating plan approximately 90% of the time. Diane states she is exercising 0 minutes 0 times per week.  Today's visit was #: 15 Starting weight: 256 lbs Starting date: 11/18/2019 Today's weight: 220 lbs Today's date: 07/30/2020 Total lbs lost to date: 36  Total lbs lost since last in-office visit: 3  Interim History: Melissa [name] reports that she has been doing less comfort eating and eating more on plan. She is in PT for her right shoulder surgery. Hunger is controlled.  Subjective:   OSA on CPAP. Melissa [name] reports using CPAP as prescribed. She is sleeping well considering recent shoulder surgery.  Other specified hypothyroidism. Melissa [name] is on levothyroxine. Last thyroid panel was at goal.   Lab Results  Component Value Date   TSH 3.910 06/18/2020   Assessment/Plan:   OSA on CPAP. Intensive lifestyle modifications are the first line treatment for this issue. We discussed several lifestyle modifications today and she will continue to work on diet, exercise and weight loss efforts. We will continue to monitor. Orders and follow up as documented in patient record. Elliannah will continue with CPAP as prescribed.  Other specified hypothyroidism. Patient with long-standing hypothyroidism, on levothyroxine therapy. She appears euthyroid. Orders and follow up as documented in patient record. Melissa [name] will continue levothyroxine as directed.   Counseling . Good thyroid control is important for overall health. Supratherapeutic thyroid levels are dangerous and will not improve weight loss results. . The correct way to take levothyroxine is fasting, with water, separated by at least 30 minutes from breakfast, and separated by more than 4 hours from  calcium, iron, multivitamins, acid reflux medications (PPIs).   Class 3 severe obesity with serious comorbidity and body mass index (BMI) of 40.0 to 44.9 in adult, unspecified obesity type (HCC).  Melissa [name] is currently in the action stage of change. As such, her goal is to continue with weight loss efforts. She has agreed to the Category 2 Plan + 100 protein calories.   Exercise goals: For substantial health benefits, adults should do at least 150 minutes (2 hours and 30 minutes) a week of moderate-intensity, or 75 minutes (1 hour and 15 minutes) a week of vigorous-intensity aerobic physical activity, or an equivalent combination of moderate- and vigorous-intensity aerobic activity. Aerobic activity should be performed in episodes of at least 10 minutes, and preferably, it should be spread throughout the week.  Behavioral modification strategies: meal planning and cooking strategies and planning for success.  Melissa [name] has agreed to follow-up with our clinic in 2-3 weeks. She was informed of the importance of frequent follow-up visits to maximize her success with intensive lifestyle modifications for her multiple health conditions.   Objective:   Blood pressure 109/71, pulse 78, temperature 98.3 F (36.8 C), height 5\' 2"  (1.575 m), weight 220 lb (99.8 kg), SpO2 99 %. Body mass index is 40.24 kg/m.  General: Cooperative, alert, well developed, in no acute distress. HEENT: Conjunctivae and lids unremarkable. Cardiovascular: Regular rhythm.  Lungs: Normal work of breathing. Neurologic: No focal deficits.   Lab Results  Component Value Date   CREATININE 0.97 06/18/2020   BUN 16 06/18/2020   NA 142 06/18/2020   K 4.8 06/18/2020   CL 103 06/18/2020   CO2 23 06/18/2020  Lab Results  Component Value Date   ALT 17 06/18/2020   AST 17 06/18/2020   ALKPHOS 108 06/18/2020   BILITOT 0.3 06/18/2020   Lab Results  Component Value Date   HGBA1C 5.9 (H) 06/18/2020   Lab Results  Component  Value Date   INSULIN 12.9 06/18/2020   INSULIN 23.2 11/18/2019   Lab Results  Component Value Date   TSH 3.910 06/18/2020   No results found for: CHOL, HDL, LDLCALC, LDLDIRECT, TRIG, CHOLHDL Lab Results  Component Value Date   WBC 8.1 06/19/2020   HGB 13.2 06/19/2020   HCT 42.4 06/19/2020   MCV 88.3 06/19/2020   PLT 374 06/19/2020   No results found for: IRON, TIBC, FERRITIN  Obesity Behavioral Intervention:   Approximately 15 minutes were spent on the discussion below.  ASK: We discussed the diagnosis of obesity with Melissa [name] today and Melissa [name] agreed to give Melissa [name] permission to discuss obesity behavioral modification therapy today.  ASSESS: Melissa [name] has the diagnosis of obesity and her BMI today is 40.3. Melissa [name] is in the action stage of change.   ADVISE: Melissa [name] was educated on the multiple health risks of obesity as well as the benefit of weight loss to improve her health. She was advised of the need for long term treatment and the importance of lifestyle modifications to improve her current health and to decrease her risk of future health problems.  AGREE: Multiple dietary modification options and treatment options were discussed and Melissa [name] agreed to follow the recommendations documented in the above note.  ARRANGE: Melissa [name] was educated on the importance of frequent visits to treat obesity as outlined per CMS and USPSTF guidelines and agreed to schedule her next follow up appointment today.  Attestation Statements:   Reviewed by clinician on day of visit: allergies, medications, problem list, medical history, surgical history, family history, social history, and previous encounter notes.  Melissa [name], am acting as transcriptionist for Melissa Cliche, PA-C   I have reviewed the above documentation for accuracy and completeness, and I agree with the above. Melissa Cliche, PA-C

## 2020-07-31 DIAGNOSIS — M25511 Pain in right shoulder: Secondary | ICD-10-CM | POA: Diagnosis not present

## 2020-07-31 DIAGNOSIS — M25611 Stiffness of right shoulder, not elsewhere classified: Secondary | ICD-10-CM | POA: Diagnosis not present

## 2020-08-05 DIAGNOSIS — M25611 Stiffness of right shoulder, not elsewhere classified: Secondary | ICD-10-CM | POA: Diagnosis not present

## 2020-08-05 DIAGNOSIS — M25511 Pain in right shoulder: Secondary | ICD-10-CM | POA: Diagnosis not present

## 2020-08-06 DIAGNOSIS — Z4789 Encounter for other orthopedic aftercare: Secondary | ICD-10-CM | POA: Diagnosis not present

## 2020-08-07 DIAGNOSIS — M25511 Pain in right shoulder: Secondary | ICD-10-CM | POA: Diagnosis not present

## 2020-08-07 DIAGNOSIS — M25611 Stiffness of right shoulder, not elsewhere classified: Secondary | ICD-10-CM | POA: Diagnosis not present

## 2020-08-12 ENCOUNTER — Other Ambulatory Visit (INDEPENDENT_AMBULATORY_CARE_PROVIDER_SITE_OTHER): Payer: Self-pay | Admitting: Physician Assistant

## 2020-08-12 DIAGNOSIS — M25511 Pain in right shoulder: Secondary | ICD-10-CM | POA: Diagnosis not present

## 2020-08-12 DIAGNOSIS — M25611 Stiffness of right shoulder, not elsewhere classified: Secondary | ICD-10-CM | POA: Diagnosis not present

## 2020-08-12 DIAGNOSIS — R7303 Prediabetes: Secondary | ICD-10-CM

## 2020-08-13 ENCOUNTER — Encounter (INDEPENDENT_AMBULATORY_CARE_PROVIDER_SITE_OTHER): Payer: Self-pay | Admitting: Physician Assistant

## 2020-08-13 ENCOUNTER — Other Ambulatory Visit: Payer: Self-pay

## 2020-08-13 ENCOUNTER — Ambulatory Visit (INDEPENDENT_AMBULATORY_CARE_PROVIDER_SITE_OTHER): Payer: Medicare HMO | Admitting: Physician Assistant

## 2020-08-13 VITALS — BP 125/83 | HR 73 | Temp 98.3°F | Ht 62.0 in | Wt 219.0 lb

## 2020-08-13 DIAGNOSIS — E66813 Obesity, class 3: Secondary | ICD-10-CM

## 2020-08-13 DIAGNOSIS — R7303 Prediabetes: Secondary | ICD-10-CM

## 2020-08-13 DIAGNOSIS — E559 Vitamin D deficiency, unspecified: Secondary | ICD-10-CM

## 2020-08-13 DIAGNOSIS — Z6841 Body Mass Index (BMI) 40.0 and over, adult: Secondary | ICD-10-CM | POA: Diagnosis not present

## 2020-08-14 DIAGNOSIS — E782 Mixed hyperlipidemia: Secondary | ICD-10-CM | POA: Diagnosis not present

## 2020-08-14 DIAGNOSIS — R69 Illness, unspecified: Secondary | ICD-10-CM | POA: Diagnosis not present

## 2020-08-14 DIAGNOSIS — M8949 Other hypertrophic osteoarthropathy, multiple sites: Secondary | ICD-10-CM | POA: Diagnosis not present

## 2020-08-14 DIAGNOSIS — E039 Hypothyroidism, unspecified: Secondary | ICD-10-CM | POA: Diagnosis not present

## 2020-08-14 DIAGNOSIS — Z0000 Encounter for general adult medical examination without abnormal findings: Secondary | ICD-10-CM | POA: Diagnosis not present

## 2020-08-14 DIAGNOSIS — M25611 Stiffness of right shoulder, not elsewhere classified: Secondary | ICD-10-CM | POA: Diagnosis not present

## 2020-08-14 DIAGNOSIS — M25511 Pain in right shoulder: Secondary | ICD-10-CM | POA: Diagnosis not present

## 2020-08-14 DIAGNOSIS — R7301 Impaired fasting glucose: Secondary | ICD-10-CM | POA: Diagnosis not present

## 2020-08-14 DIAGNOSIS — N951 Menopausal and female climacteric states: Secondary | ICD-10-CM | POA: Diagnosis not present

## 2020-08-14 DIAGNOSIS — Z23 Encounter for immunization: Secondary | ICD-10-CM | POA: Diagnosis not present

## 2020-08-14 DIAGNOSIS — Z01419 Encounter for gynecological examination (general) (routine) without abnormal findings: Secondary | ICD-10-CM | POA: Diagnosis not present

## 2020-08-14 DIAGNOSIS — F339 Major depressive disorder, recurrent, unspecified: Secondary | ICD-10-CM | POA: Diagnosis not present

## 2020-08-18 ENCOUNTER — Encounter (INDEPENDENT_AMBULATORY_CARE_PROVIDER_SITE_OTHER): Payer: Self-pay | Admitting: Physician Assistant

## 2020-08-18 ENCOUNTER — Encounter (INDEPENDENT_AMBULATORY_CARE_PROVIDER_SITE_OTHER): Payer: Self-pay

## 2020-08-18 DIAGNOSIS — R7303 Prediabetes: Secondary | ICD-10-CM

## 2020-08-18 NOTE — Telephone Encounter (Signed)
Pt last seen by Tracey. °

## 2020-08-18 NOTE — Telephone Encounter (Signed)
Message sent to pt.

## 2020-08-18 NOTE — Progress Notes (Signed)
Chief Complaint:   OBESITY Melissa [name] is here to discuss her progress with her obesity treatment plan along with follow-up of her obesity related diagnoses. Kayda is on the Category 2 Plan + 100 calories and states she is following her eating plan approximately 90% of the time. Margarethe states she is walking 5,000-6,000 steps 7 times per week.  Today's visit was #: 16 Starting weight: 256 lbs Starting date: 11/18/2019 Today's weight: 219 lbs Today's date: 08/13/2020 Total lbs lost to date: 37 Total lbs lost since last in-office visit: 1  Interim History: Talecia reports that she hasn't been eating as much because her routine is "out of whack." She is not sleeping well.  Subjective:   Prediabetes. Tekeisha has a diagnosis of prediabetes based on her elevated HgA1c and was informed this puts her at greater risk of developing diabetes. She continues to work on diet and exercise to decrease her risk of diabetes. She denies nausea or hypoglycemia. Moncia is on metformin. No nausea, vomiting, diarrhea, or polyphagia.   Lab Results  Component Value Date   HGBA1C 5.9 (H) 06/18/2020   Lab Results  Component Value Date   INSULIN 12.9 06/18/2020   INSULIN 23.2 11/18/2019   Vitamin D deficiency. Faithe is on Vitamin D 2,000 units daily, which she is tolerating well.   Ref. Range 06/18/2020 08:22  Vitamin D, 25-Hydroxy Latest Ref Range: 30.0 - 100.0 ng/mL 49.1   Assessment/Plan:   Prediabetes. Aurora will continue to work on weight loss, exercise, and decreasing simple carbohydrates to help decrease the risk of diabetes. She will continue metformin as directed.   Vitamin D deficiency. Low Vitamin D level contributes to fatigue and are associated with obesity, breast, and colon cancer. She agrees to continue to take Vitamin D as directed and will follow-up for routine testing of Vitamin D, at least 2-3 times per year to avoid over-replacement.  Class 3 severe obesity with serious  comorbidity and body mass index (BMI) of 40.0 to 44.9 in adult, unspecified obesity type (HCC).  Darthy is currently in the action stage of change. As such, her goal is to continue with weight loss efforts. She has agreed to the Category 2 Plan + 100 calories and will journal 400-500 calories and 35 grams of protein at supper.   Exercise goals: For substantial health benefits, adults should do at least 150 minutes (2 hours and 30 minutes) a week of moderate-intensity, or 75 minutes (1 hour and 15 minutes) a week of vigorous-intensity aerobic physical activity, or an equivalent combination of moderate- and vigorous-intensity aerobic activity. Aerobic activity should be performed in episodes of at least 10 minutes, and preferably, it should be spread throughout the week.  Behavioral modification strategies: increasing lean protein intake and decreasing simple carbohydrates.  Audriana has agreed to follow-up with our clinic in 2 weeks. She was informed of the importance of frequent follow-up visits to maximize her success with intensive lifestyle modifications for her multiple health conditions.   Objective:   Blood pressure 125/83, pulse 73, temperature 98.3 F (36.8 C), height 5\' 2"  (1.575 m), weight 219 lb (99.3 kg), SpO2 98 %. Body mass index is 40.06 kg/m.  General: Cooperative, alert, well developed, in no acute distress. HEENT: Conjunctivae and lids unremarkable. Cardiovascular: Regular rhythm.  Lungs: Normal work of breathing. Neurologic: No focal deficits.   Lab Results  Component Value Date   CREATININE 0.97 06/18/2020   BUN 16 06/18/2020   NA 142 06/18/2020  K 4.8 06/18/2020   CL 103 06/18/2020   CO2 23 06/18/2020   Lab Results  Component Value Date   ALT 17 06/18/2020   AST 17 06/18/2020   ALKPHOS 108 06/18/2020   BILITOT 0.3 06/18/2020   Lab Results  Component Value Date   HGBA1C 5.9 (H) 06/18/2020   Lab Results  Component Value Date   INSULIN 12.9 06/18/2020    INSULIN 23.2 11/18/2019   Lab Results  Component Value Date   TSH 3.910 06/18/2020   No results found for: CHOL, HDL, LDLCALC, LDLDIRECT, TRIG, CHOLHDL Lab Results  Component Value Date   WBC 8.1 06/19/2020   HGB 13.2 06/19/2020   HCT 42.4 06/19/2020   MCV 88.3 06/19/2020   PLT 374 06/19/2020   No results found for: IRON, TIBC, FERRITIN  Obesity Behavioral Intervention:   Approximately 15 minutes were spent on the discussion below.  ASK: We discussed the diagnosis of obesity with [name] today and [name] agreed to give [name] permission to discuss obesity behavioral modification therapy today.  ASSESS: Ryelynn has the diagnosis of obesity and her BMI today is 40.1. London is in the action stage of change.   ADVISE: Iqra was educated on the multiple health risks of obesity as well as the benefit of weight loss to improve her health. She was advised of the need for long term treatment and the importance of lifestyle modifications to improve her current health and to decrease her risk of future health problems.  AGREE: Multiple dietary modification options and treatment options were discussed and Annell agreed to follow the recommendations documented in the above note.  ARRANGE: Kathyrn was educated on the importance of frequent visits to treat obesity as outlined per CMS and USPSTF guidelines and agreed to schedule her next follow up appointment today.  Attestation Statements:   Reviewed by clinician on day of visit: allergies, medications, problem list, medical history, surgical history, family history, social history, and previous encounter notes.  [name] Payment, am acting as transcriptionist for [name], PA-C   I have reviewed the above documentation for accuracy and completeness, and I agree with the above. [name], PA-C

## 2020-08-19 DIAGNOSIS — M25611 Stiffness of right shoulder, not elsewhere classified: Secondary | ICD-10-CM | POA: Diagnosis not present

## 2020-08-19 DIAGNOSIS — M25511 Pain in right shoulder: Secondary | ICD-10-CM | POA: Diagnosis not present

## 2020-08-19 MED ORDER — METFORMIN HCL 500 MG PO TABS: 500.0000 mg | ORAL_TABLET | Freq: Every day | ORAL | 0 refills | Status: DC

## 2020-08-21 DIAGNOSIS — M25611 Stiffness of right shoulder, not elsewhere classified: Secondary | ICD-10-CM | POA: Diagnosis not present

## 2020-08-21 DIAGNOSIS — M25511 Pain in right shoulder: Secondary | ICD-10-CM | POA: Diagnosis not present

## 2020-08-26 ENCOUNTER — Encounter (INDEPENDENT_AMBULATORY_CARE_PROVIDER_SITE_OTHER): Payer: Self-pay | Admitting: Physician Assistant

## 2020-08-26 DIAGNOSIS — M25611 Stiffness of right shoulder, not elsewhere classified: Secondary | ICD-10-CM | POA: Diagnosis not present

## 2020-08-26 DIAGNOSIS — M25511 Pain in right shoulder: Secondary | ICD-10-CM | POA: Diagnosis not present

## 2020-08-27 NOTE — Telephone Encounter (Signed)
Pt wants to know about their calorie intake.Marland KitchenMarland Kitchen

## 2020-08-28 DIAGNOSIS — M25511 Pain in right shoulder: Secondary | ICD-10-CM | POA: Diagnosis not present

## 2020-08-28 DIAGNOSIS — M25611 Stiffness of right shoulder, not elsewhere classified: Secondary | ICD-10-CM | POA: Diagnosis not present

## 2020-09-01 ENCOUNTER — Ambulatory Visit (INDEPENDENT_AMBULATORY_CARE_PROVIDER_SITE_OTHER): Payer: Medicare HMO | Admitting: Physician Assistant

## 2020-09-01 DIAGNOSIS — M5416 Radiculopathy, lumbar region: Secondary | ICD-10-CM | POA: Diagnosis not present

## 2020-09-02 ENCOUNTER — Encounter (INDEPENDENT_AMBULATORY_CARE_PROVIDER_SITE_OTHER): Payer: Self-pay | Admitting: Physician Assistant

## 2020-09-02 ENCOUNTER — Other Ambulatory Visit: Payer: Self-pay

## 2020-09-02 ENCOUNTER — Ambulatory Visit (INDEPENDENT_AMBULATORY_CARE_PROVIDER_SITE_OTHER): Payer: Medicare HMO | Admitting: Physician Assistant

## 2020-09-02 VITALS — BP 111/71 | HR 73 | Temp 98.3°F | Ht 62.0 in | Wt 222.0 lb

## 2020-09-02 DIAGNOSIS — Z6841 Body Mass Index (BMI) 40.0 and over, adult: Secondary | ICD-10-CM

## 2020-09-02 DIAGNOSIS — M25611 Stiffness of right shoulder, not elsewhere classified: Secondary | ICD-10-CM | POA: Diagnosis not present

## 2020-09-02 DIAGNOSIS — R7303 Prediabetes: Secondary | ICD-10-CM

## 2020-09-02 DIAGNOSIS — E039 Hypothyroidism, unspecified: Secondary | ICD-10-CM

## 2020-09-02 DIAGNOSIS — M25511 Pain in right shoulder: Secondary | ICD-10-CM | POA: Diagnosis not present

## 2020-09-02 MED ORDER — METFORMIN HCL 500 MG PO TABS: 500.0000 mg | ORAL_TABLET | Freq: Two times a day (BID) | ORAL | 0 refills | Status: DC

## 2020-09-02 NOTE — Progress Notes (Signed)
Chief Complaint:   OBESITY Melissa [name] is here to discuss her progress with her obesity treatment plan along with follow-up of her obesity related diagnoses. [name] is on the Category 2 Plan + 100 calories and states she is following her eating plan approximately 75% of the time. [name] states she is walking 6,000 steps daily 5-6 times per week.  Today's visit was #: 17 Starting weight: 256 lbs Starting date: 11/18/2019 Today's weight: 222 lbs Today's date: 09/02/2020 Total lbs lost to date: 34 Total lbs lost since last in-office visit: 0  Interim History: [name] reports that she has been eating a lot of "junk" the last few weeks. She states that she is eating a lot of sugar and she is having increased cravings.  Subjective:   Hypothyroidism, unspecified type. Melissa [name] had recent thyroid panel drawn with her PCP. No excessive fatigue or heat/cold intolerance.   Lab Results  Component Value Date   TSH 3.910 06/18/2020   Prediabetes. Melissa [name] has a diagnosis of prediabetes based on her elevated HgA1c and was informed this puts her at greater risk of developing diabetes. She continues to work on diet and exercise to decrease her risk of diabetes. She denies nausea or hypoglycemia. Most recent A1c with her PCP was increased to 6.0 from 5.9.  Lab Results  Component Value Date   HGBA1C 5.9 (H) 06/18/2020   Lab Results  Component Value Date   INSULIN 12.9 06/18/2020   INSULIN 23.2 11/18/2019   Assessment/Plan:   Hypothyroidism, unspecified type. Patient with long-standing hypothyroidism, on levothyroxine therapy. She appears euthyroid. Orders and follow up as documented in patient record. Brittinie will continue to be managed by her PCP.  Counseling . Good thyroid control is important for overall health. Supratherapeutic thyroid levels are dangerous and will not improve weight loss results. . The correct way to take levothyroxine is fasting, with water, separated by at least 30  minutes from breakfast, and separated by more than 4 hours from calcium, iron, multivitamins, acid reflux medications (PPIs).   Prediabetes. Melissa [name] will continue to work on weight loss, exercise, and decreasing simple carbohydrates to help decrease the risk of diabetes. She will increase her metFORMIN (GLUCOPHAGE) 500 MG tablet to BID #60 with 0 refills.  Class 3 severe obesity with serious comorbidity and body mass index (BMI) of 40.0 to 44.9 in adult, unspecified obesity type (HCC).  Melissa [name] is currently in the action stage of change. As such, her goal is to continue with weight loss efforts. She has agreed to the Category 2 Plan + 100 calories.   IC will be rechecked at her next visit.  Exercise goals: For substantial health benefits, adults should do at least 150 minutes (2 hours and 30 minutes) a week of moderate-intensity, or 75 minutes (1 hour and 15 minutes) a week of vigorous-intensity aerobic physical activity, or an equivalent combination of moderate- and vigorous-intensity aerobic activity. Aerobic activity should be performed in episodes of at least 10 minutes, and preferably, it should be spread throughout the week.  Behavioral modification strategies: increasing lean protein intake and decreasing simple carbohydrates.  Melissa [name] has agreed to follow-up with our clinic in 2-3 weeks. She was informed of the importance of frequent follow-up visits to maximize her success with intensive lifestyle modifications for her multiple health conditions.   Objective:   Blood pressure 111/71, pulse 73, temperature 98.3 F (36.8 C), height 5\' 2"  (1.575 m), weight 222 lb (100.7 kg), SpO2 99 %. Body mass index  is 40.6 kg/m.  General: Cooperative, alert, well developed, in no acute distress. HEENT: Conjunctivae and lids unremarkable. Cardiovascular: Regular rhythm.  Lungs: Normal work of breathing. Neurologic: No focal deficits.   Lab Results  Component Value Date   CREATININE 0.97 06/18/2020    BUN 16 06/18/2020   NA 142 06/18/2020   K 4.8 06/18/2020   CL 103 06/18/2020   CO2 23 06/18/2020   Lab Results  Component Value Date   ALT 17 06/18/2020   AST 17 06/18/2020   ALKPHOS 108 06/18/2020   BILITOT 0.3 06/18/2020   Lab Results  Component Value Date   HGBA1C 5.9 (H) 06/18/2020   Lab Results  Component Value Date   INSULIN 12.9 06/18/2020   INSULIN 23.2 11/18/2019   Lab Results  Component Value Date   TSH 3.910 06/18/2020   No results found for: CHOL, HDL, LDLCALC, LDLDIRECT, TRIG, CHOLHDL Lab Results  Component Value Date   WBC 8.1 06/19/2020   HGB 13.2 06/19/2020   HCT 42.4 06/19/2020   MCV 88.3 06/19/2020   PLT 374 06/19/2020   No results found for: IRON, TIBC, FERRITIN  Obesity Behavioral Intervention:   Approximately 15 minutes were spent on the discussion below.  ASK: We discussed the diagnosis of obesity with Melissa [name] today and Melissa [name] agreed to give [name] permission to discuss obesity behavioral modification therapy today.  ASSESS: Melissa [name] has the diagnosis of obesity and her BMI today is 40.6. Melissa [name] is in the action stage of change.   ADVISE: Melissa [name] was educated on the multiple health risks of obesity as well as the benefit of weight loss to improve her health. She was advised of the need for long term treatment and the importance of lifestyle modifications to improve her current health and to decrease her risk of future health problems.  AGREE: Multiple dietary modification options and treatment options were discussed and Melissa [name] agreed to follow the recommendations documented in the above note.  ARRANGE: Melissa [name] was educated on the importance of frequent visits to treat obesity as outlined per CMS and USPSTF guidelines and agreed to schedule her next follow up appointment today.  Attestation Statements:   Reviewed by clinician on day of visit: allergies, medications, problem list, medical history, surgical history, family history, social  history, and previous encounter notes.  [name] Payment, am acting as transcriptionist for Melissa Cliche, PA-C   I have reviewed the above documentation for accuracy and completeness, and I agree with the above. Melissa Cliche, PA-C

## 2020-09-04 DIAGNOSIS — M25611 Stiffness of right shoulder, not elsewhere classified: Secondary | ICD-10-CM | POA: Diagnosis not present

## 2020-09-04 DIAGNOSIS — M25511 Pain in right shoulder: Secondary | ICD-10-CM | POA: Diagnosis not present

## 2020-09-09 DIAGNOSIS — M25611 Stiffness of right shoulder, not elsewhere classified: Secondary | ICD-10-CM | POA: Diagnosis not present

## 2020-09-09 DIAGNOSIS — M25511 Pain in right shoulder: Secondary | ICD-10-CM | POA: Diagnosis not present

## 2020-09-11 DIAGNOSIS — M25611 Stiffness of right shoulder, not elsewhere classified: Secondary | ICD-10-CM | POA: Diagnosis not present

## 2020-09-11 DIAGNOSIS — M25511 Pain in right shoulder: Secondary | ICD-10-CM | POA: Diagnosis not present

## 2020-09-16 DIAGNOSIS — M25611 Stiffness of right shoulder, not elsewhere classified: Secondary | ICD-10-CM | POA: Diagnosis not present

## 2020-09-16 DIAGNOSIS — M25511 Pain in right shoulder: Secondary | ICD-10-CM | POA: Diagnosis not present

## 2020-09-16 DIAGNOSIS — Z4789 Encounter for other orthopedic aftercare: Secondary | ICD-10-CM | POA: Diagnosis not present

## 2020-09-22 ENCOUNTER — Encounter (INDEPENDENT_AMBULATORY_CARE_PROVIDER_SITE_OTHER): Payer: Self-pay | Admitting: Physician Assistant

## 2020-09-22 ENCOUNTER — Other Ambulatory Visit: Payer: Self-pay

## 2020-09-22 ENCOUNTER — Ambulatory Visit (INDEPENDENT_AMBULATORY_CARE_PROVIDER_SITE_OTHER): Payer: Medicare HMO | Admitting: Physician Assistant

## 2020-09-22 VITALS — BP 106/68 | HR 69 | Temp 97.8°F | Ht 62.0 in | Wt 217.0 lb

## 2020-09-22 DIAGNOSIS — Z6841 Body Mass Index (BMI) 40.0 and over, adult: Secondary | ICD-10-CM | POA: Diagnosis not present

## 2020-09-22 DIAGNOSIS — Z6839 Body mass index (BMI) 39.0-39.9, adult: Secondary | ICD-10-CM

## 2020-09-22 DIAGNOSIS — E559 Vitamin D deficiency, unspecified: Secondary | ICD-10-CM

## 2020-09-22 DIAGNOSIS — R7303 Prediabetes: Secondary | ICD-10-CM | POA: Diagnosis not present

## 2020-09-22 MED ORDER — METFORMIN HCL 500 MG PO TABS: 500.0000 mg | ORAL_TABLET | Freq: Two times a day (BID) | ORAL | 0 refills | Status: DC

## 2020-09-22 NOTE — Progress Notes (Signed)
Chief Complaint:   OBESITY Melissa [name] is here to discuss her progress with her obesity treatment plan along with follow-up of her obesity related diagnoses. [name] is on the Category 2 Plan and states she is following her eating plan approximately 95% of the time. [name] states she is walking 30 minutes 7 times per week.  Today's visit was #: 18 Starting weight: 256 lbs Starting date: 11/18/2019 Today's weight: 217 lbs Today's date: 09/22/2020 Total lbs lost to date: 39 Total lbs lost since last in-office visit: 5  Interim History: [name] did very well with staying on plan the last few weeks. She notes excessive hunger after breakfast. Her IC was unable to be checked today due to machine error.   Subjective:   Prediabetes. Dan has a diagnosis of prediabetes based on her elevated HgA1c and was informed this puts her at greater risk of developing diabetes. She continues to work on diet and exercise to decrease her risk of diabetes. [name] is on metformin. No nausea, vomiting, or diarrhea. She does report polyphagia. She is due for labs.  Lab Results  Component Value Date   HGBA1C 5.9 (H) 06/18/2020   Lab Results  Component Value Date   INSULIN 12.9 06/18/2020   INSULIN 23.2 11/18/2019   Vitamin D deficiency. [name] is on OTC Vitamin D 2,000 units daily. Energy is reported to be good. She is due for labs.   Ref. Range 06/18/2020 08:22  Vitamin D, 25-Hydroxy Latest Ref Range: 30.0 - 100.0 ng/mL 49.1   Assessment/Plan:   Prediabetes. [name] will continue to work on weight loss, exercise, and decreasing simple carbohydrates to help decrease the risk of diabetes. Refill was given for metFORMIN (GLUCOPHAGE) 500 MG tablet #60 with 0 refills. Labs will be checked today.   Vitamin D deficiency. Low Vitamin D level contributes to fatigue and are associated with obesity, breast, and colon cancer. VITAMIN D 25 Hydroxy (Vit-D Deficiency, Fractures) level will be checked today.     Class 2 severe obesity with serious comorbidity and body mass index (BMI) of 39.0 to 39.9 in adult, unspecified obesity type (HCC).    [name] is currently in the action stage of change. As such, her goal is to continue with weight loss efforts. She has agreed to keeping a food journal and adhering to recommended goals of 1400 calories and 100 grams of protein.   IC will be checked at her next office visit.  Exercise goals: For substantial health benefits, adults should do at least 150 minutes (2 hours and 30 minutes) a week of moderate-intensity, or 75 minutes (1 hour and 15 minutes) a week of vigorous-intensity aerobic physical activity, or an equivalent combination of moderate- and vigorous-intensity aerobic activity. Aerobic activity should be performed in episodes of at least 10 minutes, and preferably, it should be spread throughout the week.  Behavioral modification strategies: meal planning and cooking strategies and planning for success.  [name] has agreed to follow-up with our clinic in 2 weeks. She was informed of the importance of frequent follow-up visits to maximize her success with intensive lifestyle modifications for her multiple health conditions.   [name] was informed we would discuss her lab results at her next visit unless there is a critical issue that needs to be addressed sooner. [name] agreed to keep her next visit at the agreed upon time to discuss these results.  Objective:   Blood pressure 106/68, pulse 69, temperature 97.8 F (36.6 C), height 5\' 2"  (1.575 m),  weight 217 lb (98.4 kg), SpO2 98 %. Body mass index is 39.69 kg/m.  General: Cooperative, alert, well developed, in no acute distress. HEENT: Conjunctivae and lids unremarkable. Cardiovascular: Regular rhythm.  Lungs: Normal work of breathing. Neurologic: No focal deficits.   Lab Results  Component Value Date   CREATININE 0.97 06/18/2020   BUN 16 06/18/2020   NA 142 06/18/2020   K 4.8 06/18/2020    CL 103 06/18/2020   CO2 23 06/18/2020   Lab Results  Component Value Date   ALT 17 06/18/2020   AST 17 06/18/2020   ALKPHOS 108 06/18/2020   BILITOT 0.3 06/18/2020   Lab Results  Component Value Date   HGBA1C 5.9 (H) 06/18/2020   Lab Results  Component Value Date   INSULIN 12.9 06/18/2020   INSULIN 23.2 11/18/2019   Lab Results  Component Value Date   TSH 3.910 06/18/2020   No results found for: CHOL, HDL, LDLCALC, LDLDIRECT, TRIG, CHOLHDL Lab Results  Component Value Date   WBC 8.1 06/19/2020   HGB 13.2 06/19/2020   HCT 42.4 06/19/2020   MCV 88.3 06/19/2020   PLT 374 06/19/2020   No results found for: IRON, TIBC, FERRITIN  Obesity Behavioral Intervention:   Approximately 15 minutes were spent on the discussion below.  ASK: We discussed the diagnosis of obesity with [name] today and [name] agreed to give [name] permission to discuss obesity behavioral modification therapy today.  ASSESS: [name] has the diagnosis of obesity and her BMI today is 39.8. [name] is in the action stage of change.   ADVISE: [name] was educated on the multiple health risks of obesity as well as the benefit of weight loss to improve her health. She was advised of the need for long term treatment and the importance of lifestyle modifications to improve her current health and to decrease her risk of future health problems.  AGREE: Multiple dietary modification options and treatment options were discussed and [name] agreed to follow the recommendations documented in the above note.  ARRANGE: [name] was educated on the importance of frequent visits to treat obesity as outlined per CMS and USPSTF guidelines and agreed to schedule her next follow up appointment today.  Attestation Statements:   Reviewed by clinician on day of visit: allergies, medications, problem list, medical history, surgical history, family history, social history, and previous encounter notes.  [name] Payment, am acting as  transcriptionist for [name], PA-C   I have reviewed the above documentation for accuracy and completeness, and I agree with the above. [name], PA-C

## 2020-09-23 LAB — COMPREHENSIVE METABOLIC PANEL
ALT: 20 IU/L (ref 0–32)
AST: 13 IU/L (ref 0–40)
Albumin/Globulin Ratio: 1.6 (ref 1.2–2.2)
Albumin: 4.3 g/dL (ref 3.8–4.9)
Alkaline Phosphatase: 111 IU/L (ref 44–121)
BUN/Creatinine Ratio: 23 (ref 9–23)
BUN: 20 mg/dL (ref 6–24)
Bilirubin Total: 0.3 mg/dL (ref 0.0–1.2)
CO2: 25 mmol/L (ref 20–29)
Calcium: 9.8 mg/dL (ref 8.7–10.2)
Chloride: 102 mmol/L (ref 96–106)
Creatinine, Ser: 0.88 mg/dL (ref 0.57–1.00)
GFR calc Af Amer: 85 mL/min/{1.73_m2} (ref >59)
GFR calc non Af Amer: 74 mL/min/{1.73_m2} (ref >59)
Globulin, Total: 2.7 g/dL (ref 1.5–4.5)
Glucose: 97 mg/dL (ref 65–99)
Potassium: 5 mmol/L (ref 3.5–5.2)
Sodium: 140 mmol/L (ref 134–144)
Total Protein: 7 g/dL (ref 6.0–8.5)

## 2020-09-23 LAB — HEMOGLOBIN A1C
Est. average glucose Bld gHb Est-mCnc: 111 mg/dL
Hgb A1c MFr Bld: 5.5 % (ref 4.8–5.6)

## 2020-09-23 LAB — INSULIN, RANDOM: INSULIN: 10.7 u[IU]/mL (ref 2.6–24.9)

## 2020-09-23 LAB — VITAMIN D 25 HYDROXY (VIT D DEFICIENCY, FRACTURES): Vit D, 25-Hydroxy: 76.4 ng/mL (ref 30.0–100.0)

## 2020-09-24 DIAGNOSIS — M5416 Radiculopathy, lumbar region: Secondary | ICD-10-CM | POA: Diagnosis not present

## 2020-09-25 DIAGNOSIS — M25511 Pain in right shoulder: Secondary | ICD-10-CM | POA: Diagnosis not present

## 2020-09-25 DIAGNOSIS — M25611 Stiffness of right shoulder, not elsewhere classified: Secondary | ICD-10-CM | POA: Diagnosis not present

## 2020-09-30 DIAGNOSIS — M25511 Pain in right shoulder: Secondary | ICD-10-CM | POA: Diagnosis not present

## 2020-09-30 DIAGNOSIS — M25611 Stiffness of right shoulder, not elsewhere classified: Secondary | ICD-10-CM | POA: Diagnosis not present

## 2020-10-02 DIAGNOSIS — M25511 Pain in right shoulder: Secondary | ICD-10-CM | POA: Diagnosis not present

## 2020-10-02 DIAGNOSIS — M25611 Stiffness of right shoulder, not elsewhere classified: Secondary | ICD-10-CM | POA: Diagnosis not present

## 2020-10-09 DIAGNOSIS — M25611 Stiffness of right shoulder, not elsewhere classified: Secondary | ICD-10-CM | POA: Diagnosis not present

## 2020-10-09 DIAGNOSIS — M25511 Pain in right shoulder: Secondary | ICD-10-CM | POA: Diagnosis not present

## 2020-10-13 ENCOUNTER — Ambulatory Visit (INDEPENDENT_AMBULATORY_CARE_PROVIDER_SITE_OTHER): Payer: Medicare HMO | Admitting: Physician Assistant

## 2020-10-14 DIAGNOSIS — M25511 Pain in right shoulder: Secondary | ICD-10-CM | POA: Diagnosis not present

## 2020-10-14 DIAGNOSIS — M25611 Stiffness of right shoulder, not elsewhere classified: Secondary | ICD-10-CM | POA: Diagnosis not present

## 2020-10-21 DIAGNOSIS — M25611 Stiffness of right shoulder, not elsewhere classified: Secondary | ICD-10-CM | POA: Diagnosis not present

## 2020-10-21 DIAGNOSIS — M25511 Pain in right shoulder: Secondary | ICD-10-CM | POA: Diagnosis not present

## 2020-10-28 DIAGNOSIS — Z4789 Encounter for other orthopedic aftercare: Secondary | ICD-10-CM | POA: Diagnosis not present

## 2020-11-02 ENCOUNTER — Encounter (INDEPENDENT_AMBULATORY_CARE_PROVIDER_SITE_OTHER): Payer: Self-pay | Admitting: Physician Assistant

## 2020-11-02 ENCOUNTER — Other Ambulatory Visit: Payer: Self-pay

## 2020-11-02 ENCOUNTER — Ambulatory Visit (INDEPENDENT_AMBULATORY_CARE_PROVIDER_SITE_OTHER): Payer: Medicare HMO | Admitting: Physician Assistant

## 2020-11-02 VITALS — BP 126/74 | HR 79 | Temp 97.9°F | Ht 62.0 in | Wt 224.0 lb

## 2020-11-02 DIAGNOSIS — R7303 Prediabetes: Secondary | ICD-10-CM | POA: Diagnosis not present

## 2020-11-02 DIAGNOSIS — Z6841 Body Mass Index (BMI) 40.0 and over, adult: Secondary | ICD-10-CM | POA: Diagnosis not present

## 2020-11-02 DIAGNOSIS — E559 Vitamin D deficiency, unspecified: Secondary | ICD-10-CM

## 2020-11-03 NOTE — Progress Notes (Unsigned)
Chief Complaint:   OBESITY Melissa [name] is here to discuss her progress with her obesity treatment plan along with follow-up of her obesity related diagnoses. Melissa [name] is on keeping a food journal and adhering to recommended goals of 1400 calories and 100 grams of protein daily and states she is following her eating plan approximately 75% of the time. 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  Today's visit was #: 19 Starting weight: 256 lbs Starting date: 11/18/2019 Today's weight: 224 lbs Today's date: 11/02/2020 Total lbs lost to date: 32 Total lbs lost since last in-office visit: 0  Interim History: Melissa [name] reports that she "fell off the wagon" since New Year's Eve. She is eating potatoes, chips and salsa, and sweets. When she averaged out her calories, she was eating around 1600 calories daily.  Subjective:   1. Pre-diabetes Melissa [name] has been out of her metformin for the last 2-3 weeks. I discussed labs with the patient today.  2. Vitamin D deficiency Melissa [name] is on 4,000 units of Vit D. Last Vit D level was at goal. I discussed labs with the patient today.  Assessment/Plan:   1. Pre-diabetes Melissa [name] will continue to work on weight loss, exercise, and decreasing simple carbohydrates to help decrease the risk of diabetes. We will refill metformin 500 mg BID #60 for 1 month.  2. Vitamin D deficiency Low Vitamin D level contributes to fatigue and are associated with obesity, breast, and colon cancer. Melissa [name] agreed to decrease OTC Vitamin D to 2,000 units daily and will follow-up for routine testing of Vitamin D, at least 2-3 times per year to avoid over-replacement.  3. Class 3 severe obesity with serious comorbidity and body mass index (BMI) of 40.0 to 44.9 in adult, unspecified obesity type (HCC) Melissa [name] is currently in the action stage of change. As such, her goal is to continue with weight loss efforts. She has agreed to the Category 2 Plan + 100  calories.   We will recheck IC at her next visit.  Exercise goals: As is.  Behavioral modification strategies: meal planning and cooking strategies and keeping healthy foods in the home.  Melissa [name] has agreed to follow-up with our clinic in 2 weeks. She was informed of the importance of frequent follow-up visits to maximize her success with intensive lifestyle modifications for her multiple health conditions.   Objective:   Blood pressure 126/74, pulse 79, temperature 97.9 F (36.6 C), height 5\' 2"  (1.575 m), weight 224 lb (101.6 kg), SpO2 99 %. Body mass index is 40.97 kg/m.  General: Cooperative, alert, well developed, in no acute distress. HEENT: Conjunctivae and lids unremarkable. Cardiovascular: Regular rhythm.  Lungs: Normal work of breathing. Neurologic: No focal deficits.   Lab Results  Component Value Date   CREATININE 0.88 09/22/2020   BUN 20 09/22/2020   NA 140 09/22/2020   K 5.0 09/22/2020   CL 102 09/22/2020   CO2 25 09/22/2020   Lab Results  Component Value Date   ALT 20 09/22/2020   AST 13 09/22/2020   ALKPHOS 111 09/22/2020   BILITOT 0.3 09/22/2020   Lab Results  Component Value Date   HGBA1C 5.5 09/22/2020   HGBA1C 5.9 (H) 06/18/2020   Lab Results  Component Value Date   INSULIN 10.7 09/22/2020   INSULIN 12.9 06/18/2020   INSULIN 23.2 11/18/2019   Lab Results  Component Value Date   TSH 3.910 06/18/2020   No results found for: CHOL, HDL,  LDLCALC, LDLDIRECT, TRIG, CHOLHDL Lab Results  Component Value Date   WBC 8.1 06/19/2020   HGB 13.2 06/19/2020   HCT 42.4 06/19/2020   MCV 88.3 06/19/2020   PLT 374 06/19/2020   No results found for: IRON, TIBC, FERRITIN  Obesity Behavioral Intervention:   Approximately 15 minutes were spent on the discussion below.  ASK: We discussed the diagnosis of obesity with Melissa [name] today and Melissa [name] agreed to give [name] permission to discuss obesity behavioral modification therapy today.  ASSESS: Melissa [name] has the  diagnosis of obesity and her BMI today is 40.96. Melissa [name] is in the action stage of change.   ADVISE: Melissa [name] was educated on the multiple health risks of obesity as well as the benefit of weight loss to improve her health. She was advised of the need for long term treatment and the importance of lifestyle modifications to improve her current health and to decrease her risk of future health problems.  AGREE: Multiple dietary modification options and treatment options were discussed and Melissa [name] agreed to follow the recommendations documented in the above note.  ARRANGE: Melissa [name] was educated on the importance of frequent visits to treat obesity as outlined per CMS and USPSTF guidelines and agreed to schedule her next follow up appointment today.  Attestation Statements:   Reviewed by clinician on day of visit: allergies, medications, problem list, medical history, surgical history, family history, social history, and previous encounter notes.   Melissa [name], am acting as transcriptionist for [employer], PA-C.  I have reviewed the above documentation for accuracy and completeness, and I agree with the above. Melissa Cliche, PA-C

## 2020-11-06 MED ORDER — METFORMIN HCL 500 MG PO TABS: 500.0000 mg | ORAL_TABLET | Freq: Two times a day (BID) | ORAL | 0 refills | Status: DC

## 2020-11-17 ENCOUNTER — Ambulatory Visit (INDEPENDENT_AMBULATORY_CARE_PROVIDER_SITE_OTHER): Payer: Medicare HMO | Admitting: Physician Assistant

## 2020-11-17 ENCOUNTER — Other Ambulatory Visit: Payer: Self-pay

## 2020-11-17 ENCOUNTER — Encounter (INDEPENDENT_AMBULATORY_CARE_PROVIDER_SITE_OTHER): Payer: Self-pay | Admitting: Physician Assistant

## 2020-11-17 VITALS — BP 115/71 | HR 76 | Temp 97.5°F | Ht 62.0 in | Wt 213.0 lb

## 2020-11-17 DIAGNOSIS — Z9989 Dependence on other enabling machines and devices: Secondary | ICD-10-CM | POA: Diagnosis not present

## 2020-11-17 DIAGNOSIS — Z6839 Body mass index (BMI) 39.0-39.9, adult: Secondary | ICD-10-CM | POA: Diagnosis not present

## 2020-11-17 DIAGNOSIS — G4733 Obstructive sleep apnea (adult) (pediatric): Secondary | ICD-10-CM

## 2020-11-17 DIAGNOSIS — R0602 Shortness of breath: Secondary | ICD-10-CM

## 2020-11-18 NOTE — Progress Notes (Signed)
Chief Complaint:   OBESITY Melissa [name] is here to discuss her progress with her obesity treatment plan along with follow-up of her obesity related diagnoses. Melissa [name] is on the Category 2 Plan + 100 calories and states she is following her eating plan approximately 90% of the time. Melissa [name] states she is doing 0 minutes 0 times per week.  Today's visit was #: 20 Starting weight: 256 lbs Starting date: 11/18/2019 Today's weight: 213 lbs Today's date: 11/17/2020 Total lbs lost to date: 43 Total lbs lost since last in-office visit: 11  Interim History: Melissa [name] reports that she cut out sweets, potatoes, and junk food in general. She was sick last week with an upper respiratory infection, and she continues to feel fatigued. Overall her hunger has decreased. Her RMR today shows a decrease to 1273.  Subjective:   1. SOB (shortness of breath) Melissa [name] notes shortness of breath with exertion. She denies dizziness or lightheadedness. She denies palpitations.  2. OSA on CPAP Melissa [name] is using CPAP nightly as prescribed, and she is sleeping well. She is followed by sleep specialist.  Assessment/Plan:   1. SOB (shortness of breath) IC was done today. Melissa [name] has agreed to work on weight loss and gradually increase exercise to treat her exercise induced shortness of breath. Will continue to monitor closely.  2. OSA on CPAP Intensive lifestyle modifications are the first line treatment for this issue. We discussed several lifestyle modifications today. Melissa [name] will follow up with sleep specialist and will continue to use CPAP nightly. She will continue to work on diet, exercise and weight loss efforts. We will continue to monitor. Orders and follow up as documented in patient record.   3. Class 2 severe obesity with serious comorbidity and body mass index (BMI) of 39.0 to 39.9 in adult, unspecified obesity type (HCC) Melissa [name] is currently in the action stage of change. As such, her goal is to continue with  weight loss efforts. She has agreed to the Category 2 Plan.   Exercise goals: No exercise has been prescribed at this time.  Behavioral modification strategies: keeping healthy foods in the home, avoiding temptations and planning for success.  Melissa [name] has agreed to follow-up with our clinic in 2 weeks. She was informed of the importance of frequent follow-up visits to maximize her success with intensive lifestyle modifications for her multiple health conditions.   Objective:   Blood pressure 115/71, pulse 76, temperature (!) 97.5 F (36.4 C), height 5\' 2"  (1.575 m), weight 213 lb (96.6 kg), SpO2 97 %. Body mass index is 38.96 kg/m.  General: Cooperative, alert, well developed, in no acute distress. HEENT: Conjunctivae and lids unremarkable. Cardiovascular: Regular rhythm.  Lungs: Normal work of breathing. Neurologic: No focal deficits.   Lab Results  Component Value Date   CREATININE 0.88 09/22/2020   BUN 20 09/22/2020   NA 140 09/22/2020   K 5.0 09/22/2020   CL 102 09/22/2020   CO2 25 09/22/2020   Lab Results  Component Value Date   ALT 20 09/22/2020   AST 13 09/22/2020   ALKPHOS 111 09/22/2020   BILITOT 0.3 09/22/2020   Lab Results  Component Value Date   HGBA1C 5.5 09/22/2020   HGBA1C 5.9 (H) 06/18/2020   Lab Results  Component Value Date   INSULIN 10.7 09/22/2020   INSULIN 12.9 06/18/2020   INSULIN 23.2 11/18/2019   Lab Results  Component Value Date   TSH 3.910 06/18/2020   No results found for: CHOL, HDL, LDLCALC, LDLDIRECT, TRIG,  CHOLHDL Lab Results  Component Value Date   WBC 8.1 06/19/2020   HGB 13.2 06/19/2020   HCT 42.4 06/19/2020   MCV 88.3 06/19/2020   PLT 374 06/19/2020   No results found for: IRON, TIBC, FERRITIN  Obesity Behavioral Intervention:   Approximately 15 minutes were spent on the discussion below.  ASK: We discussed the diagnosis of obesity with Melissa [name] today and Aveline agreed to give [name] permission to discuss obesity  behavioral modification therapy today.  ASSESS: Tonni has the diagnosis of obesity and her BMI today is 38.95. Nazirah is in the action stage of change.   ADVISE: Ritha was educated on the multiple health risks of obesity as well as the benefit of weight loss to improve her health. She was advised of the need for long term treatment and the importance of lifestyle modifications to improve her current health and to decrease her risk of future health problems.  AGREE: Multiple dietary modification options and treatment options were discussed and Lititia agreed to follow the recommendations documented in the above note.  ARRANGE: Dori was educated on the importance of frequent visits to treat obesity as outlined per CMS and USPSTF guidelines and agreed to schedule her next follow up appointment today.  Attestation Statements:   Reviewed by clinician on day of visit: allergies, medications, problem list, medical history, surgical history, family history, social history, and previous encounter notes.   Trude Mcburney, am acting as transcriptionist for [employer], PA-C.  I have reviewed the above documentation for accuracy and completeness, and I agree with the above. [name], PA-C

## 2020-11-30 ENCOUNTER — Other Ambulatory Visit: Payer: Self-pay

## 2020-11-30 ENCOUNTER — Encounter (INDEPENDENT_AMBULATORY_CARE_PROVIDER_SITE_OTHER): Payer: Self-pay | Admitting: Family Medicine

## 2020-11-30 ENCOUNTER — Ambulatory Visit (INDEPENDENT_AMBULATORY_CARE_PROVIDER_SITE_OTHER): Payer: Medicare HMO | Admitting: Family Medicine

## 2020-11-30 VITALS — BP 104/68 | HR 80 | Temp 97.9°F | Ht 62.0 in | Wt 216.0 lb

## 2020-11-30 DIAGNOSIS — Z6839 Body mass index (BMI) 39.0-39.9, adult: Secondary | ICD-10-CM | POA: Diagnosis not present

## 2020-11-30 DIAGNOSIS — E038 Other specified hypothyroidism: Secondary | ICD-10-CM

## 2020-11-30 DIAGNOSIS — E039 Hypothyroidism, unspecified: Secondary | ICD-10-CM | POA: Insufficient documentation

## 2020-11-30 DIAGNOSIS — R7303 Prediabetes: Secondary | ICD-10-CM | POA: Diagnosis not present

## 2020-11-30 MED ORDER — RYBELSUS 3 MG PO TABS: ORAL_TABLET | ORAL | 0 refills | Status: DC

## 2020-12-02 NOTE — Progress Notes (Signed)
Chief Complaint:   OBESITY Melissa [name] is here to discuss her progress with her obesity treatment plan along with follow-up of her obesity related diagnoses.   Today's visit was #: 21 Starting weight: 256 lbs Starting date: 11/18/2019 Today's weight: 216 lbs Today's date: 11/30/2020 Total lbs lost to date: 40 lbs Body mass index is 39.51 kg/m.  Total weight loss percentage to date: -15.63%  Interim History:  Melissa [name] is here for a follow up office visit.  she is following the meal plan without concern or issues, except for some increased hunger.  She denies cravings.  Patient's meal and food recall appears to be accurate and consistent with what is on the plan.    Nutrition Plan: Category 2 for 95% of the time. Activity: Walking for 30 minutes 4 times per week.  Assessment/Plan:   No orders of the defined types were placed in this encounter.   Medications Discontinued During This Encounter  Medication Reason  . metFORMIN (GLUCOPHAGE) 500 MG tablet      Meds ordered this encounter  Medications  . Semaglutide (RYBELSUS) 3 MG TABS    Sig: 1 po qd    Dispense:  30 tablet    Refill:  0     1. Other specified hypothyroidism Medication: Synthroid 175 mcg daily.   Melissa [name] is a 55 y.o. female who presents for follow up of hypothyroidism. Current symptoms: no concerns.  Patient denies symptoms. Symptoms have stabilized.  Plan: Patient was instructed not to take MVM or iron within 4 hours of taking thyroid medications. This issue is managed by her PCP. We will continue to monitor alongside Endocrinology/PCP as it relates to her weight loss journey.   Lab Results  Component Value Date   TSH 3.910 06/18/2020   2. Prediabetes Goal is HgbA1c < 5.7.  Medication: metformin.  She has had some increased hunger lately.  She has been on metformin since September 2021.  Plan:  Discontinue metformin after unsuccessful trial, and change to GLP-1.  Start Rybelsus 3 mg 1/2  tablet daily, as per below.  Extensive drug education was provided.   She will continue to focus on protein-rich, low simple carbohydrate foods. We reviewed the importance of hydration, regular exercise for stress reduction, and restorative sleep.   Lab Results  Component Value Date   HGBA1C 5.5 09/22/2020   Lab Results  Component Value Date   INSULIN 10.7 09/22/2020   INSULIN 12.9 06/18/2020   INSULIN 23.2 11/18/2019   - Start Semaglutide (RYBELSUS) 3 MG TABS; 1 po qd  Dispense: 30 tablet; Refill: 0  3. Class 2 severe obesity with serious comorbidity and body mass index (BMI) of 39.0 to 39.9 in adult, unspecified obesity type (HCC)  Course: Melissa [name] is currently in the action stage of change. As such, her goal is to continue with weight loss efforts.   Nutrition goals: She has agreed to the Category 2 Plan.   Exercise goals: For substantial health benefits, adults should do at least 150 minutes (2 hours and 30 minutes) a week of moderate-intensity, or 75 minutes (1 hour and 15 minutes) a week of vigorous-intensity aerobic physical activity, or an equivalent combination of moderate- and vigorous-intensity aerobic activity. Aerobic activity should be performed in episodes of at least 10 minutes, and preferably, it should be spread throughout the week.  Behavioral modification strategies: increasing lean protein intake, decreasing simple carbohydrates and planning for success.  Melissa [name] has agreed to follow-up with our clinic  in 2 weeks with Melissa Cliche, PA-C, or myself. She was informed of the importance of frequent follow-up visits to maximize her success with intensive lifestyle modifications for her multiple health conditions.   Objective:   Blood pressure 104/68, pulse 80, temperature 97.9 F (36.6 C), height 5\' 2"  (1.575 m), weight 216 lb (98 kg), SpO2 97 %. Body mass index is 39.51 kg/m.  General: Cooperative, alert, well developed, in no acute distress. HEENT: Conjunctivae  and lids unremarkable. Cardiovascular: Regular rhythm.  Lungs: Normal work of breathing. Neurologic: No focal deficits.   Lab Results  Component Value Date   CREATININE 0.88 09/22/2020   BUN 20 09/22/2020   NA 140 09/22/2020   K 5.0 09/22/2020   CL 102 09/22/2020   CO2 25 09/22/2020   Lab Results  Component Value Date   ALT 20 09/22/2020   AST 13 09/22/2020   ALKPHOS 111 09/22/2020   BILITOT 0.3 09/22/2020   Lab Results  Component Value Date   HGBA1C 5.5 09/22/2020   HGBA1C 5.9 (H) 06/18/2020   Lab Results  Component Value Date   INSULIN 10.7 09/22/2020   INSULIN 12.9 06/18/2020   INSULIN 23.2 11/18/2019   Lab Results  Component Value Date   TSH 3.910 06/18/2020   Lab Results  Component Value Date   WBC 8.1 06/19/2020   HGB 13.2 06/19/2020   HCT 42.4 06/19/2020   MCV 88.3 06/19/2020   PLT 374 06/19/2020   Obesity Behavioral Intervention:   Approximately 15 minutes were spent on the discussion below.  ASK: We discussed the diagnosis of obesity with 06/21/2020 today and Melissa [name] agreed to give Melissa [name] permission to discuss obesity behavioral modification therapy today.  ASSESS: Melissa [name] has the diagnosis of obesity and her BMI today is 39.7. Melissa [name] is in the action stage of change.   ADVISE: Melissa [name] was educated on the multiple health risks of obesity as well as the benefit of weight loss to improve her health. She was advised of the need for long term treatment and the importance of lifestyle modifications to improve her current health and to decrease her risk of future health problems.  AGREE: Multiple dietary modification options and treatment options were discussed and Melissa [name] agreed to follow the recommendations documented in the above note.  ARRANGE: Melissa [name] was educated on the importance of frequent visits to treat obesity as outlined per CMS and USPSTF guidelines and agreed to schedule her next follow up appointment today.  Attestation Statements:   Reviewed by  clinician on day of visit: allergies, medications, problem list, medical history, surgical history, family history, social history, and previous encounter notes.  I, Melissa [name], CMA, am acting as Insurance claims handler for Energy manager, DO.  I have reviewed the above documentation for accuracy and completeness, and I agree with the above. [employer], D.O.  The 21st Century Cures Act was signed into law in 2016 which includes the topic of electronic health records.  This provides immediate access to information in MyChart.  This includes consultation notes, operative notes, office notes, lab results and pathology reports.  If you have any questions about what you read please let 2017 know at your next visit so we can discuss your concerns and take corrective action if need be.  We are right here with you.

## 2020-12-14 ENCOUNTER — Encounter (INDEPENDENT_AMBULATORY_CARE_PROVIDER_SITE_OTHER): Payer: Self-pay | Admitting: Physician Assistant

## 2020-12-14 ENCOUNTER — Other Ambulatory Visit: Payer: Self-pay

## 2020-12-14 ENCOUNTER — Ambulatory Visit (INDEPENDENT_AMBULATORY_CARE_PROVIDER_SITE_OTHER): Payer: Medicare HMO | Admitting: Physician Assistant

## 2020-12-14 VITALS — BP 144/68 | HR 74 | Temp 98.0°F | Ht 62.0 in | Wt 213.0 lb

## 2020-12-14 DIAGNOSIS — R7303 Prediabetes: Secondary | ICD-10-CM

## 2020-12-14 DIAGNOSIS — Z6838 Body mass index (BMI) 38.0-38.9, adult: Secondary | ICD-10-CM

## 2020-12-14 DIAGNOSIS — K5909 Other constipation: Secondary | ICD-10-CM

## 2020-12-14 MED ORDER — RYBELSUS 7 MG PO TABS: 7.0000 mg | ORAL_TABLET | Freq: Every day | ORAL | 0 refills | Status: DC

## 2020-12-15 NOTE — Progress Notes (Signed)
Chief Complaint:   OBESITY Melissa [name] is here to discuss her progress with her obesity treatment plan along with follow-up of her obesity related diagnoses. Melissa [name] is on the Category 2 Plan and states she is following her eating plan approximately 85% of the time. Melissa [name] states she is walking for 30 minutes 4 times per week.  Today's visit was #: 22 Starting weight: 256 lbs Starting date: 11/18/2019 Today's weight: 213 lbs Today's date: 12/14/2020 Total lbs lost to date: 43 Total lbs lost since last in-office visit: 3  Interim History: Melissa [name] did very well with weight loss. She has been eating out less often. She started Rybelsus after last visit and reports that she continues to be hungry sometimes throughout the day.  Subjective:   1. Pre-diabetes Melissa [name] is on Rybelsus 3 mg, and reports polyphagia. She is no longer taking metformin.  2. Other constipation Melissa [name] is having to take OTC as well as prescribed medications to help with constipation. She denies blood in her stool.  Assessment/Plan:   1. Pre-diabetes Melissa [name] agreed to increase Rybelsus to 7 mg daily with no refills. She will continue to work on weight loss, exercise, and decreasing simple carbohydrates to help decrease the risk of diabetes.   2. Other constipation Melissa [name] was informed that a decrease in bowel movement frequency is normal while losing weight, but stools should not be hard or painful. Melissa [name] is to increase fiber, increase water, and add metamucil daily. Orders and follow up as documented in patient record.   Counseling Getting to Good Bowel Health: Your goal is to have one soft bowel movement each day. Drink at least 8 glasses of water each day. Eat plenty of fiber (goal is over 25 grams each day). It is best to get most of your fiber from dietary sources which includes leafy green vegetables, fresh fruit, and whole grains. You may need to add fiber with the help of OTC fiber supplements. These include  Metamucil, Citrucel, and Flaxseed. If you are still having trouble, try adding Miralax or Magnesium Citrate. If all of these changes do not work, Dietitian.  3. Class 2 severe obesity with serious comorbidity and body mass index (BMI) of 38.0 to 38.9 in adult, unspecified obesity type (HCC) Melissa [name] is currently in the action stage of change. As such, her goal is to continue with weight loss efforts. She has agreed to the Category 2 Plan.   Exercise goals: As is.  Behavioral modification strategies: meal planning and cooking strategies and keeping healthy foods in the home.  Melissa [name] has agreed to follow-up with our clinic in 2 weeks. She was informed of the importance of frequent follow-up visits to maximize her success with intensive lifestyle modifications for her multiple health conditions.   Objective:   Blood pressure (!) 144/68, pulse 74, temperature 98 F (36.7 C), height 5\' 2"  (1.575 m), weight 213 lb (96.6 kg), SpO2 98 %. Body mass index is 38.96 kg/m.  General: Cooperative, alert, well developed, in no acute distress. HEENT: Conjunctivae and lids unremarkable. Cardiovascular: Regular rhythm.  Lungs: Normal work of breathing. Neurologic: No focal deficits.   Lab Results  Component Value Date   CREATININE 0.88 09/22/2020   BUN 20 09/22/2020   NA 140 09/22/2020   K 5.0 09/22/2020   CL 102 09/22/2020   CO2 25 09/22/2020   Lab Results  Component Value Date   ALT 20 09/22/2020   AST 13 09/22/2020   ALKPHOS 111 09/22/2020  BILITOT 0.3 09/22/2020   Lab Results  Component Value Date   HGBA1C 5.5 09/22/2020   HGBA1C 5.9 (H) 06/18/2020   Lab Results  Component Value Date   INSULIN 10.7 09/22/2020   INSULIN 12.9 06/18/2020   INSULIN 23.2 11/18/2019   Lab Results  Component Value Date   TSH 3.910 06/18/2020   No results found for: CHOL, HDL, LDLCALC, LDLDIRECT, TRIG, CHOLHDL Lab Results  Component Value Date   WBC 8.1 06/19/2020   HGB 13.2 06/19/2020    HCT 42.4 06/19/2020   MCV 88.3 06/19/2020   PLT 374 06/19/2020   No results found for: IRON, TIBC, FERRITIN  Obesity Behavioral Intervention:   Approximately 15 minutes were spent on the discussion below.  ASK: We discussed the diagnosis of obesity with Melissa [name] today and Melissa [name] agreed to give [name] permission to discuss obesity behavioral modification therapy today.  ASSESS: Melissa [name] has the diagnosis of obesity and her BMI today is 38.95. Melissa [name] is in the action stage of change.   ADVISE: Melissa [name] was educated on the multiple health risks of obesity as well as the benefit of weight loss to improve her health. She was advised of the need for long term treatment and the importance of lifestyle modifications to improve her current health and to decrease her risk of future health problems.  AGREE: Multiple dietary modification options and treatment options were discussed and Melissa [name] agreed to follow the recommendations documented in the above note.  ARRANGE: Melissa [name] was educated on the importance of frequent visits to treat obesity as outlined per CMS and USPSTF guidelines and agreed to schedule her next follow up appointment today.  Attestation Statements:   Reviewed by clinician on day of visit: allergies, medications, problem list, medical history, surgical history, family history, social history, and previous encounter notes.   Melissa [name], am acting as transcriptionist for [employer], PA-C.  I have reviewed the above documentation for accuracy and completeness, and I agree with the above. [name], PA-C

## 2020-12-31 ENCOUNTER — Other Ambulatory Visit: Payer: Self-pay

## 2020-12-31 ENCOUNTER — Encounter (INDEPENDENT_AMBULATORY_CARE_PROVIDER_SITE_OTHER): Payer: Self-pay | Admitting: Physician Assistant

## 2020-12-31 ENCOUNTER — Ambulatory Visit (INDEPENDENT_AMBULATORY_CARE_PROVIDER_SITE_OTHER): Payer: Medicare HMO | Admitting: Physician Assistant

## 2020-12-31 VITALS — BP 118/71 | HR 73 | Temp 97.8°F | Ht 62.0 in | Wt 211.0 lb

## 2020-12-31 DIAGNOSIS — R7303 Prediabetes: Secondary | ICD-10-CM

## 2020-12-31 DIAGNOSIS — E559 Vitamin D deficiency, unspecified: Secondary | ICD-10-CM | POA: Diagnosis not present

## 2020-12-31 DIAGNOSIS — Z6838 Body mass index (BMI) 38.0-38.9, adult: Secondary | ICD-10-CM | POA: Diagnosis not present

## 2020-12-31 MED ORDER — METFORMIN HCL 500 MG PO TABS: 500.0000 mg | ORAL_TABLET | Freq: Every day | ORAL | 0 refills | Status: DC

## 2021-01-04 DIAGNOSIS — Z1231 Encounter for screening mammogram for malignant neoplasm of breast: Secondary | ICD-10-CM | POA: Diagnosis not present

## 2021-01-06 NOTE — Progress Notes (Signed)
Chief Complaint:   OBESITY Melissa [name] is here to discuss her progress with her obesity treatment plan along with follow-up of her obesity related diagnoses. Melissa [name] is on the Category 2 Plan and states she is following her eating plan approximately 90% of the time. 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  Today's visit was #: 23 Starting weight: 256 lbs Starting date: 11/18/2019 Today's weight: 211 lbs Today's date: 12/31/2020 Total lbs lost to date: 45 Total lbs lost since last in-office visit: 2  Interim History: Melissa [name] reports that her hunger is less due to being busier during the day with warmer weather. Her cravings have decreased. She is not eating enough food at breakfast or eating all of her snacks.  Subjective:   1. Pre-diabetes [name] was on Rybelsus 7 mg for 1 week, then discontinued due to constipation and reflux. She prefers metformin.  2. Vitamin D deficiency [name] is on Vit D OTC, and she is due for labs soon.  Assessment/Plan:   1. Pre-diabetes Melissa [name] agreed to discontinue Rybelsus, and restart metformin 500 mg once daily with no refills. She will continue to work on weight loss, exercise, and decreasing simple carbohydrates to help decrease the risk of diabetes.   - metFORMIN (GLUCOPHAGE) 500 MG tablet; Take 1 tablet (500 mg total) by mouth daily.  Dispense: 30 tablet; Refill: 0  2. Vitamin D deficiency Low Vitamin D level contributes to fatigue and are associated with obesity, breast, and colon cancer. Melissa [name] agreed to continue taking OTC Vitamin D, and we will recheck labs at her next visit. She will follow-up for routine testing of Vitamin D, at least 2-3 times per year to avoid over-replacement.  3. Class 2 severe obesity with serious comorbidity and body mass index (BMI) of 38.0 to 38.9 in adult, unspecified obesity type (HCC) Melissa [name] is currently in the action stage of change. As such, her goal is to continue with weight  loss efforts. She has agreed to the Category 2 Plan.   We will recheck fasting labs at her next visit.  Exercise goals: As is.  Behavioral modification strategies: meal planning and cooking strategies and keeping healthy foods in the home.  Melissa [name] has agreed to follow-up with our clinic in 2 weeks. She was informed of the importance of frequent follow-up visits to maximize her success with intensive lifestyle modifications for her multiple health conditions.   Objective:   Blood pressure 118/71, pulse 73, temperature 97.8 F (36.6 C), height 5\' 2"  (1.575 m), weight 211 lb (95.7 kg). Body mass index is 38.59 kg/m.  General: Cooperative, alert, well developed, in no acute distress. HEENT: Conjunctivae and lids unremarkable. Cardiovascular: Regular rhythm.  Lungs: Normal work of breathing. Neurologic: No focal deficits.   Lab Results  Component Value Date   CREATININE 0.88 09/22/2020   BUN 20 09/22/2020   NA 140 09/22/2020   K 5.0 09/22/2020   CL 102 09/22/2020   CO2 25 09/22/2020   Lab Results  Component Value Date   ALT 20 09/22/2020   AST 13 09/22/2020   ALKPHOS 111 09/22/2020   BILITOT 0.3 09/22/2020   Lab Results  Component Value Date   HGBA1C 5.5 09/22/2020   HGBA1C 5.9 (H) 06/18/2020   Lab Results  Component Value Date   INSULIN 10.7 09/22/2020   INSULIN 12.9 06/18/2020   INSULIN 23.2 11/18/2019   Lab Results  Component Value Date   TSH 3.910 06/18/2020   No results  found for: CHOL, HDL, LDLCALC, LDLDIRECT, TRIG, CHOLHDL Lab Results  Component Value Date   WBC 8.1 06/19/2020   HGB 13.2 06/19/2020   HCT 42.4 06/19/2020   MCV 88.3 06/19/2020   PLT 374 06/19/2020   No results found for: IRON, TIBC, FERRITIN  Obesity Behavioral Intervention:   Approximately 15 minutes were spent on the discussion below.  ASK: We discussed the diagnosis of obesity with Melissa [name] today and Melissa [name] agreed to give Melissa [name] permission to discuss obesity behavioral modification  therapy today.  ASSESS: Melissa [name] has the diagnosis of obesity and her BMI today is 38.58. Melissa [name] is in the action stage of change.   ADVISE: Melissa [name] was educated on the multiple health risks of obesity as well as the benefit of weight loss to improve her health. She was advised of the need for long term treatment and the importance of lifestyle modifications to improve her current health and to decrease her risk of future health problems.  AGREE: Multiple dietary modification options and treatment options were discussed and Melissa [name] agreed to follow the recommendations documented in the above note.  ARRANGE: Melissa [name] was educated on the importance of frequent visits to treat obesity as outlined per CMS and USPSTF guidelines and agreed to schedule her next follow up appointment today.  Attestation Statements:   Reviewed by clinician on day of visit: allergies, medications, problem list, medical history, surgical history, family history, social history, and previous encounter notes.   Melissa [name], am acting as transcriptionist for [employer], PA-C.  I have reviewed the above documentation for accuracy and completeness, and I agree with the above. Melissa Cliche, PA-C

## 2021-01-13 ENCOUNTER — Ambulatory Visit (INDEPENDENT_AMBULATORY_CARE_PROVIDER_SITE_OTHER): Payer: Medicare HMO | Admitting: Physician Assistant

## 2021-01-13 ENCOUNTER — Encounter (INDEPENDENT_AMBULATORY_CARE_PROVIDER_SITE_OTHER): Payer: Self-pay | Admitting: Physician Assistant

## 2021-01-13 ENCOUNTER — Other Ambulatory Visit: Payer: Self-pay

## 2021-01-13 VITALS — BP 107/65 | HR 68 | Temp 97.6°F | Ht 62.0 in | Wt 210.0 lb

## 2021-01-13 DIAGNOSIS — Z6838 Body mass index (BMI) 38.0-38.9, adult: Secondary | ICD-10-CM

## 2021-01-13 DIAGNOSIS — E559 Vitamin D deficiency, unspecified: Secondary | ICD-10-CM | POA: Diagnosis not present

## 2021-01-13 DIAGNOSIS — E039 Hypothyroidism, unspecified: Secondary | ICD-10-CM | POA: Diagnosis not present

## 2021-01-13 DIAGNOSIS — R7303 Prediabetes: Secondary | ICD-10-CM

## 2021-01-13 DIAGNOSIS — E7849 Other hyperlipidemia: Secondary | ICD-10-CM

## 2021-01-13 MED ORDER — METFORMIN HCL 500 MG PO TABS: 500.0000 mg | ORAL_TABLET | Freq: Every day | ORAL | 0 refills | Status: DC

## 2021-01-14 DIAGNOSIS — G4733 Obstructive sleep apnea (adult) (pediatric): Secondary | ICD-10-CM | POA: Diagnosis not present

## 2021-01-14 DIAGNOSIS — G8929 Other chronic pain: Secondary | ICD-10-CM | POA: Diagnosis not present

## 2021-01-14 DIAGNOSIS — E785 Hyperlipidemia, unspecified: Secondary | ICD-10-CM | POA: Diagnosis not present

## 2021-01-14 DIAGNOSIS — J309 Allergic rhinitis, unspecified: Secondary | ICD-10-CM | POA: Diagnosis not present

## 2021-01-14 DIAGNOSIS — K219 Gastro-esophageal reflux disease without esophagitis: Secondary | ICD-10-CM | POA: Diagnosis not present

## 2021-01-14 DIAGNOSIS — E039 Hypothyroidism, unspecified: Secondary | ICD-10-CM | POA: Diagnosis not present

## 2021-01-14 DIAGNOSIS — E119 Type 2 diabetes mellitus without complications: Secondary | ICD-10-CM | POA: Diagnosis not present

## 2021-01-14 DIAGNOSIS — R69 Illness, unspecified: Secondary | ICD-10-CM | POA: Diagnosis not present

## 2021-01-14 LAB — THYROID PANEL WITH TSH
Free Thyroxine Index: 2.9 (ref 1.2–4.9)
T3 Uptake Ratio: 28 % (ref 24–39)
T4, Total: 10.5 ug/dL (ref 4.5–12.0)
TSH: 0.351 u[IU]/mL — ABNORMAL LOW (ref 0.450–4.500)

## 2021-01-14 LAB — COMPREHENSIVE METABOLIC PANEL
ALT: 15 IU/L (ref 0–32)
AST: 15 IU/L (ref 0–40)
Albumin/Globulin Ratio: 2 (ref 1.2–2.2)
Albumin: 4.6 g/dL (ref 3.8–4.9)
Alkaline Phosphatase: 105 IU/L (ref 44–121)
BUN/Creatinine Ratio: 17 (ref 9–23)
BUN: 15 mg/dL (ref 6–24)
Bilirubin Total: 0.3 mg/dL (ref 0.0–1.2)
CO2: 22 mmol/L (ref 20–29)
Calcium: 9.7 mg/dL (ref 8.7–10.2)
Chloride: 100 mmol/L (ref 96–106)
Creatinine, Ser: 0.86 mg/dL (ref 0.57–1.00)
Globulin, Total: 2.3 g/dL (ref 1.5–4.5)
Glucose: 112 mg/dL — ABNORMAL HIGH (ref 65–99)
Potassium: 4.3 mmol/L (ref 3.5–5.2)
Sodium: 140 mmol/L (ref 134–144)
Total Protein: 6.9 g/dL (ref 6.0–8.5)
eGFR: 79 mL/min/{1.73_m2} (ref >59)

## 2021-01-14 LAB — LIPID PANEL
Chol/HDL Ratio: 3.1 ratio (ref 0.0–4.4)
Cholesterol, Total: 194 mg/dL (ref 100–199)
HDL: 62 mg/dL (ref >39)
LDL Chol Calc (NIH): 116 mg/dL — ABNORMAL HIGH (ref 0–99)
Triglycerides: 91 mg/dL (ref 0–149)
VLDL Cholesterol Cal: 16 mg/dL (ref 5–40)

## 2021-01-14 LAB — HEMOGLOBIN A1C
Est. average glucose Bld gHb Est-mCnc: 117 mg/dL
Hgb A1c MFr Bld: 5.7 % — ABNORMAL HIGH (ref 4.8–5.6)

## 2021-01-14 LAB — VITAMIN D 25 HYDROXY (VIT D DEFICIENCY, FRACTURES): Vit D, 25-Hydroxy: 69.7 ng/mL (ref 30.0–100.0)

## 2021-01-14 LAB — INSULIN, RANDOM: INSULIN: 15.6 u[IU]/mL (ref 2.6–24.9)

## 2021-01-18 NOTE — Progress Notes (Signed)
Chief Complaint:   OBESITY Ambyr is here to discuss her progress with her obesity treatment plan along with follow-up of her obesity related diagnoses. Braiden is on the Category 2 Plan and states she is following her eating plan approximately 90% of the time. Daisie states she is walking for 30 minutes 4 times per week.  Today's visit was #: 24 Starting weight: 256 lbs Starting date: 11/18/2019 Today's weight: 210 lbs Today's date: 01/13/2021 Total lbs lost to date: 46 Total lbs lost since last in-office visit: 1  Interim History: Adalei did well with weight loss. She added more food to breakfast, and she is eating her snack calories. 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  Subjective:   1. Pre-diabetes Tanice is on metformin, and she denies nausea, vomiting, or diarrhea. She is due for labs.  2. Vitamin D deficiency Shital is due for labs. She is on OTC Vit D.  3. Other hyperlipidemia Kristia is on pravastatin, and she denies chest pain. She is exercising regularly.  4. Hypothyroidism, unspecified type Sabel is on levothyroxine, and she denies excessive fatigue or heat/cold intolerance.  Assessment/Plan:   1. Pre-diabetes Gali will continue to work on weight loss, exercise, and decreasing simple carbohydrates to help decrease the risk of diabetes. We will check labs today, and we will refill metformin for 1 month.  - metFORMIN (GLUCOPHAGE) 500 MG tablet; Take 1 tablet (500 mg total) by mouth daily.  Dispense: 30 tablet; Refill: 0 - Comprehensive metabolic panel - Hemoglobin A1c - Insulin, random  2. Vitamin D deficiency Low Vitamin D level contributes to fatigue and are associated with obesity, breast, and colon cancer. We will check labs today. Latrina agreed to continue taking OTC Vitamin D and will follow-up for routine testing of Vitamin D, at least 2-3 times per year to avoid over-replacement.  - VITAMIN D 25 Hydroxy (Vit-D Deficiency,  Fractures)  3. Other hyperlipidemia Cardiovascular risk and specific lipid/LDL goals reviewed. We discussed several lifestyle modifications today. We will check labs today. Collyn will continue to work on diet, exercise and weight loss efforts. Orders and follow up as documented in patient record.   Counseling Intensive lifestyle modifications are the first line treatment for this issue. . Dietary changes: Increase soluble fiber. Decrease simple carbohydrates. . Exercise changes: Moderate to vigorous-intensity aerobic activity 150 minutes per week if tolerated. . Lipid-lowering medications: see documented in medical record.  - Comprehensive metabolic panel - Lipid panel  4. Hypothyroidism, unspecified type We will check labs today. Orders and follow up as documented in patient record.  Counseling . Good thyroid control is important for overall health. Supratherapeutic thyroid levels are dangerous and will not improve weight loss results. . Counseling: The correct way to take levothyroxine is fasting, with water, separated by at least 30 minutes from breakfast, and separated by more than 4 hours from calcium, iron, multivitamins, acid reflux medications (PPIs).   - Thyroid Panel With TSH  5. Class 2 severe obesity with serious comorbidity and body mass index (BMI) of 38.0 to 38.9 in adult, unspecified obesity type (HCC) Janalynn is currently in the action stage of change. As such, her goal is to continue with weight loss efforts. She has agreed to the Category 2 Plan.   Exercise goals: As is.  Behavioral modification strategies: meal planning and cooking strategies.  Irelynn has agreed to follow-up with our clinic in 2 weeks. She was informed of the importance  of frequent follow-up visits to maximize her success with intensive lifestyle modifications for her multiple health conditions.   Objective:   Blood pressure 107/65, pulse 68, temperature 97.6 F (36.4 C), temperature source  Oral, height 5\' 2"  (1.575 m), weight 210 lb (95.3 kg), SpO2 98 %. Body mass index is 38.41 kg/m.  General: Cooperative, alert, well developed, in no acute distress. HEENT: Conjunctivae and lids unremarkable. Cardiovascular: Regular rhythm.  Lungs: Normal work of breathing. Neurologic: No focal deficits.   Lab Results  Component Value Date   CREATININE 0.86 01/13/2021   BUN 15 01/13/2021   NA 140 01/13/2021   K 4.3 01/13/2021   CL 100 01/13/2021   CO2 22 01/13/2021   Lab Results  Component Value Date   ALT 15 01/13/2021   AST 15 01/13/2021   ALKPHOS 105 01/13/2021   BILITOT 0.3 01/13/2021   Lab Results  Component Value Date   HGBA1C 5.7 (H) 01/13/2021   HGBA1C 5.5 09/22/2020   HGBA1C 5.9 (H) 06/18/2020   Lab Results  Component Value Date   INSULIN 15.6 01/13/2021   INSULIN 10.7 09/22/2020   INSULIN 12.9 06/18/2020   INSULIN 23.2 11/18/2019   Lab Results  Component Value Date   TSH 0.351 (L) 01/13/2021   Lab Results  Component Value Date   CHOL 194 01/13/2021   HDL 62 01/13/2021   LDLCALC 116 (H) 01/13/2021   TRIG 91 01/13/2021   CHOLHDL 3.1 01/13/2021   Lab Results  Component Value Date   WBC 8.1 06/19/2020   HGB 13.2 06/19/2020   HCT 42.4 06/19/2020   MCV 88.3 06/19/2020   PLT 374 06/19/2020   No results found for: IRON, TIBC, FERRITIN  Obesity Behavioral Intervention:   Approximately 15 minutes were spent on the discussion below.  ASK: We discussed the diagnosis of obesity with 06/21/2020 today and Alyah agreed to give [name]  ASSESS: Taina has the diagnosis of obesity and her BMI today is 38.4. Maja is in the action stage of change.   ADVISE: Areonna was educated on the multiple health risks of obesity as well as the benefit of weight loss to improve her health. She was advised of the need for long term treatment and the importance of lifestyle modifications to improve her current  health and to decrease her risk of future health problems.  AGREE: Multiple dietary modification options and treatment options were discussed and Cassia agreed to follow the recommendations documented in the above note.  ARRANGE: Maeley was educated on the importance of frequent visits to treat obesity as outlined per CMS and USPSTF guidelines and agreed to schedule her next follow up appointment today.  Attestation Statements:   Reviewed by clinician on day of visit: allergies, medications, problem list, medical history, surgical history, family history, social history, and previous encounter notes.   [name], am acting as transcriptionist for Trude Mcburney, PA-C.  I have reviewed the above documentation for accuracy and completeness, and I agree with the above. [employer], PA-C

## 2021-02-03 ENCOUNTER — Ambulatory Visit (INDEPENDENT_AMBULATORY_CARE_PROVIDER_SITE_OTHER): Payer: Medicare HMO | Admitting: Physician Assistant

## 2021-02-08 ENCOUNTER — Encounter (INDEPENDENT_AMBULATORY_CARE_PROVIDER_SITE_OTHER): Payer: Self-pay | Admitting: Physician Assistant

## 2021-02-08 ENCOUNTER — Ambulatory Visit (INDEPENDENT_AMBULATORY_CARE_PROVIDER_SITE_OTHER): Payer: Medicare HMO | Admitting: Physician Assistant

## 2021-02-08 ENCOUNTER — Other Ambulatory Visit: Payer: Self-pay

## 2021-02-08 VITALS — BP 118/75 | HR 75 | Temp 97.5°F | Ht 62.0 in | Wt 212.0 lb

## 2021-02-08 DIAGNOSIS — E7849 Other hyperlipidemia: Secondary | ICD-10-CM

## 2021-02-08 DIAGNOSIS — Z6841 Body Mass Index (BMI) 40.0 and over, adult: Secondary | ICD-10-CM | POA: Diagnosis not present

## 2021-02-08 DIAGNOSIS — R7303 Prediabetes: Secondary | ICD-10-CM

## 2021-02-08 MED ORDER — METFORMIN HCL 500 MG PO TABS: 500.0000 mg | ORAL_TABLET | Freq: Two times a day (BID) | ORAL | 0 refills | Status: DC

## 2021-02-09 NOTE — Progress Notes (Signed)
Chief Complaint:   OBESITY Melissa [name] is here to discuss Melissa [name] progress with Melissa [name] obesity treatment plan along with follow-up of Melissa [name] obesity related diagnoses. Melissa [name] is on the Category 2 Plan and states she is following Melissa [name] eating plan approximately 80% of the time. Melissa [name] states she is Walking fo 30 minutes 5 times per week.  Today's visit was #: 24 Starting weight: 256 lbs Starting date: 11/18/2019 Today's weight: 212 lbs Today's date: 02/09/2021 Total lbs lost to date: 44 lbs Total lbs lost since last in-office visit: 0  Interim History: Melissa [name] reports that she indulged over multiple birthday celebrations and also over the Easter holiday. Otherwise she was able to remain on plan pretty well.  Subjective:   1. Pre-diabetes Medication: Metformin. Melissa [name] has a diagnosis of prediabetes based on Melissa [name] elevated HgA1c and was informed this puts Melissa [name] at greater risk of developing diabetes. She continues to work on diet and exercise to decrease Melissa [name] risk of diabetes. She denies nausea, vomiting, or diarrhea. Last A1C was 5.7, worsened from 5.5. She reports polyphagia.  Lab Results  Component Value Date   HGBA1C 5.7 (H) 01/13/2021   Lab Results  Component Value Date   INSULIN 15.6 01/13/2021   INSULIN 10.7 09/22/2020   INSULIN 12.9 06/18/2020   INSULIN 23.2 11/18/2019   2. Other hyperlipidemia Medication: Pravachol. Patient denies myalgias or chest pain. Followed by PCP.  Lab Results  Component Value Date   CHOL 194 01/13/2021   HDL 62 01/13/2021   LDLCALC 116 (H) 01/13/2021   TRIG 91 01/13/2021   CHOLHDL 3.1 01/13/2021   Lab Results  Component Value Date   ALT 15 01/13/2021   AST 15 01/13/2021   ALKPHOS 105 01/13/2021   BILITOT 0.3 01/13/2021   The 10-year ASCVD risk score Denman George DC Jr., et al., 2013) is: 1.8%   Values used to calculate the score:     Age: 55 years     Sex: Female     Is Non-Hispanic African American: No     Diabetic: No     Tobacco smoker: No      Systolic Blood Pressure: 118 mmHg     Is BP treated: No     HDL Cholesterol: 62 mg/dL     Total Cholesterol: 194 mg/dL  Assessment/Plan:   1. Pre-diabetes Melissa [name] will continue to work on weight loss, exercise, and decreasing simple carbohydrates to help decrease the risk of diabetes. We will increase Metformin to twice daily.  - Start metFORMIN (GLUCOPHAGE) 500 MG tablet; Take 1 tablet (500 mg total) by mouth 2 (two) times daily.  Dispense: 60 tablet; Refill: 0  2. Other hyperlipidemia Cardiovascular risk and specific lipid/LDL goals reviewed.  We discussed several lifestyle modifications today and Melissa [name] will continue to work on diet, exercise and weight loss efforts. Orders and follow up as documented in patient record. Continue medications and follow up with PCP.  Counseling Intensive lifestyle modifications are the first line treatment for this issue. . Dietary changes: Increase soluble fiber. Decrease simple carbohydrates. . Exercise changes: Moderate to vigorous-intensity aerobic activity 150 minutes per week if tolerated. . Lipid-lowering medications: see documented in medical record.  3. Obesity, current BMI 38.9 Melissa [name] is currently in the action stage of change. As such, Melissa [name] goal is to continue with weight loss efforts. She has agreed to the Category 2 Plan.   Exercise goals: As is.  Behavioral modification strategies: meal planning and cooking strategies and keeping healthy foods in the home.  Melissa [name] has agreed to follow-up with our clinic in 2 weeks. She was informed of the importance of frequent follow-up visits to maximize Melissa [name] success with intensive lifestyle modifications for Melissa [name] multiple health conditions.   Objective:   Blood pressure 118/75, pulse 75, temperature (!) 97.5 F (36.4 C), height 5\' 2"  (1.575 m), weight 212 lb (96.2 kg), SpO2 99 %. Body mass index is 38.78 kg/m.  General: Cooperative, alert, well developed, in no acute distress. HEENT: Conjunctivae  and lids unremarkable. Cardiovascular: Regular rhythm.  Lungs: Normal work of breathing. Neurologic: No focal deficits.   Lab Results  Component Value Date   CREATININE 0.86 01/13/2021   BUN 15 01/13/2021   NA 140 01/13/2021   K 4.3 01/13/2021   CL 100 01/13/2021   CO2 22 01/13/2021   Lab Results  Component Value Date   ALT 15 01/13/2021   AST 15 01/13/2021   ALKPHOS 105 01/13/2021   BILITOT 0.3 01/13/2021   Lab Results  Component Value Date   HGBA1C 5.7 (H) 01/13/2021   HGBA1C 5.5 09/22/2020   HGBA1C 5.9 (H) 06/18/2020   Lab Results  Component Value Date   INSULIN 15.6 01/13/2021   INSULIN 10.7 09/22/2020   INSULIN 12.9 06/18/2020   INSULIN 23.2 11/18/2019   Lab Results  Component Value Date   TSH 0.351 (L) 01/13/2021   Lab Results  Component Value Date   CHOL 194 01/13/2021   HDL 62 01/13/2021   LDLCALC 116 (H) 01/13/2021   TRIG 91 01/13/2021   CHOLHDL 3.1 01/13/2021   Lab Results  Component Value Date   WBC 8.1 06/19/2020   HGB 13.2 06/19/2020   HCT 42.4 06/19/2020   MCV 88.3 06/19/2020   PLT 374 06/19/2020   Obesity Behavioral Intervention:   Approximately 15 minutes were spent on the discussion below.  ASK: We discussed the diagnosis of obesity with Melissa [name] today and Melissa [name] agreed to give Melissa [name] permission to discuss obesity behavioral modification therapy today.  ASSESS: Melissa [name] has the diagnosis of obesity and Melissa [name] BMI today is 38.9. Melissa [name] is in the action stage of change.   ADVISE: Melissa [name] was educated on the multiple health risks of obesity as well as the benefit of weight loss to improve Melissa [name] health. She was advised of the need for long term treatment and the importance of lifestyle modifications to improve Melissa [name] current health and to decrease Melissa [name] risk of future health problems.  AGREE: Multiple dietary modification options and treatment options were discussed and Melissa [name] agreed to follow the recommendations documented in the above  note.  ARRANGE: Melissa [name] was educated on the importance of frequent visits to treat obesity as outlined per CMS and USPSTF guidelines and agreed to schedule Melissa [name] next follow up appointment today.  Attestation Statements:   Reviewed by clinician on day of visit: allergies, medications, problem list, medical history, surgical history, family history, social history, and previous encounter notes.  Melissa [name], CMA, am acting as 03-30-1975 for [employer], PA-C.  I have reviewed the above documentation for accuracy and completeness, and I agree with the above. [employer], PA-C

## 2021-02-16 ENCOUNTER — Other Ambulatory Visit (INDEPENDENT_AMBULATORY_CARE_PROVIDER_SITE_OTHER): Payer: Self-pay | Admitting: Physician Assistant

## 2021-02-16 ENCOUNTER — Encounter (INDEPENDENT_AMBULATORY_CARE_PROVIDER_SITE_OTHER): Payer: Self-pay | Admitting: Physician Assistant

## 2021-02-16 MED ORDER — VITAMIN D3 25 MCG (1000 UT) PO CAPS: 4000.0000 [IU] | ORAL_CAPSULE | Freq: Every day | ORAL | 0 refills | Status: DC

## 2021-02-16 NOTE — Telephone Encounter (Signed)
FYI

## 2021-03-01 ENCOUNTER — Encounter (INDEPENDENT_AMBULATORY_CARE_PROVIDER_SITE_OTHER): Payer: Self-pay | Admitting: Physician Assistant

## 2021-03-01 ENCOUNTER — Other Ambulatory Visit: Payer: Self-pay

## 2021-03-01 ENCOUNTER — Ambulatory Visit (INDEPENDENT_AMBULATORY_CARE_PROVIDER_SITE_OTHER): Payer: Medicare HMO | Admitting: Physician Assistant

## 2021-03-01 VITALS — BP 125/79 | HR 70 | Temp 97.6°F | Ht 62.0 in | Wt 220.0 lb

## 2021-03-01 DIAGNOSIS — Z6838 Body mass index (BMI) 38.0-38.9, adult: Secondary | ICD-10-CM | POA: Diagnosis not present

## 2021-03-01 DIAGNOSIS — E559 Vitamin D deficiency, unspecified: Secondary | ICD-10-CM

## 2021-03-01 NOTE — Progress Notes (Signed)
Chief Complaint:   OBESITY Melissa [name] is here to discuss her progress with her obesity treatment plan along with follow-up of her obesity related diagnoses. Melissa [name] is on the Category 2 Plan and states she is following her eating plan approximately 80% of the time. Melissa [name] states she is doing 0 minutes 0 times per week.  Today's visit was #: 25 Starting weight: 256 lbs Starting date: 11/18/2019 Today's weight: 220 lbs Today's date: 03/01/2021 Total lbs lost to date: 36 Total lbs lost since last in-office visit: 0  Interim History: Melissa [name] reports that she is "off the rails". She has been going out to buy sweet treats.  Subjective:   1. Vitamin D deficiency Melissa [name] is on Vit D 4,000 units daily.  Assessment/Plan:   1. Vitamin D deficiency Low Vitamin D level contributes to fatigue and are associated with obesity, breast, and colon cancer. Melissa [name] agreed to continue taking Vitamin D 4,000 units daily and will follow-up for routine testing of Vitamin D, at least 2-3 times per year to avoid over-replacement.  2. Class 2 severe obesity with serious comorbidity and body mass index (BMI) of 38.0 to 38.9 in adult, unspecified obesity type (HCC) Melissa [name] is currently in the action stage of change. As such, her goal is to continue with weight loss efforts. She has agreed to change to following a lower carbohydrate, vegetable and lean protein rich diet plan.   Exercise goals: No exercise has been prescribed at this time.  Behavioral modification strategies: meal planning and cooking strategies and keeping healthy foods in the home.  Melissa [name] has agreed to follow-up with our clinic in 2 weeks. She was informed of the importance of frequent follow-up visits to maximize her success with intensive lifestyle modifications for her multiple health conditions.   Objective:   Blood pressure 125/79, pulse 70, temperature 97.6 F (36.4 C), height 5\' 2"  (1.575 m), weight 220 lb (99.8 kg), SpO2 100 %. Body  mass index is 40.24 kg/m.  General: Cooperative, alert, well developed, in no acute distress. HEENT: Conjunctivae and lids unremarkable. Cardiovascular: Regular rhythm.  Lungs: Normal work of breathing. Neurologic: No focal deficits.   Lab Results  Component Value Date   CREATININE 0.86 01/13/2021   BUN 15 01/13/2021   NA 140 01/13/2021   K 4.3 01/13/2021   CL 100 01/13/2021   CO2 22 01/13/2021   Lab Results  Component Value Date   ALT 15 01/13/2021   AST 15 01/13/2021   ALKPHOS 105 01/13/2021   BILITOT 0.3 01/13/2021   Lab Results  Component Value Date   HGBA1C 5.7 (H) 01/13/2021   HGBA1C 5.5 09/22/2020   HGBA1C 5.9 (H) 06/18/2020   Lab Results  Component Value Date   INSULIN 15.6 01/13/2021   INSULIN 10.7 09/22/2020   INSULIN 12.9 06/18/2020   INSULIN 23.2 11/18/2019   Lab Results  Component Value Date   TSH 0.351 (L) 01/13/2021   Lab Results  Component Value Date   CHOL 194 01/13/2021   HDL 62 01/13/2021   LDLCALC 116 (H) 01/13/2021   TRIG 91 01/13/2021   CHOLHDL 3.1 01/13/2021   Lab Results  Component Value Date   WBC 8.1 06/19/2020   HGB 13.2 06/19/2020   HCT 42.4 06/19/2020   MCV 88.3 06/19/2020   PLT 374 06/19/2020   No results found for: IRON, TIBC, FERRITIN  Obesity Behavioral Intervention:   Approximately 15 minutes were spent on the discussion below.  ASK: We discussed the diagnosis of obesity  with Melissa [name] today and Melissa [name] agreed to give [name] permission to discuss obesity behavioral modification therapy today.  ASSESS: Melissa [name] has the diagnosis of obesity and her BMI today is 40.23. Melissa [name] is in the action stage of change.   ADVISE: Melissa [name] was educated on the multiple health risks of obesity as well as the benefit of weight loss to improve her health. She was advised of the need for long term treatment and the importance of lifestyle modifications to improve her current health and to decrease her risk of future health  problems.  AGREE: Multiple dietary modification options and treatment options were discussed and Melissa [name] agreed to follow the recommendations documented in the above note.  ARRANGE: Melissa [name] was educated on the importance of frequent visits to treat obesity as outlined per CMS and USPSTF guidelines and agreed to schedule her next follow up appointment today.  Attestation Statements:   Reviewed by clinician on day of visit: allergies, medications, problem list, medical history, surgical history, family history, social history, and previous encounter notes.   Melissa [name], am acting as transcriptionist for [employer], PA-C.  I have reviewed the above documentation for accuracy and completeness, and I agree with the above. Melissa Cliche, PA-C

## 2021-03-15 ENCOUNTER — Other Ambulatory Visit: Payer: Self-pay

## 2021-03-15 ENCOUNTER — Ambulatory Visit (INDEPENDENT_AMBULATORY_CARE_PROVIDER_SITE_OTHER): Payer: Medicare HMO | Admitting: Physician Assistant

## 2021-03-15 ENCOUNTER — Encounter (INDEPENDENT_AMBULATORY_CARE_PROVIDER_SITE_OTHER): Payer: Self-pay | Admitting: Physician Assistant

## 2021-03-15 VITALS — BP 122/77 | HR 72 | Temp 98.1°F | Ht 62.0 in | Wt 210.0 lb

## 2021-03-15 DIAGNOSIS — E7849 Other hyperlipidemia: Secondary | ICD-10-CM | POA: Diagnosis not present

## 2021-03-15 DIAGNOSIS — Z6841 Body Mass Index (BMI) 40.0 and over, adult: Secondary | ICD-10-CM

## 2021-03-15 DIAGNOSIS — R7303 Prediabetes: Secondary | ICD-10-CM

## 2021-03-15 MED ORDER — METFORMIN HCL 500 MG PO TABS: 500.0000 mg | ORAL_TABLET | Freq: Two times a day (BID) | ORAL | 0 refills | Status: DC

## 2021-03-16 NOTE — Progress Notes (Signed)
Chief Complaint:   OBESITY Melissa [name] is here to discuss her progress with her obesity treatment plan along with follow-up of her obesity related diagnoses. Melissa [name] is on following a lower carbohydrate, vegetable and lean protein rich diet plan and states she is following her eating plan approximately 98% of the time. 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  Today's visit was #: 26 Starting weight: 256 lbs Starting date: 11/18/2019 Today's weight: 210 lbs Today's date: 03/15/2021 Total lbs lost to date: 46 Total lbs lost since last in-office visit: 10  Interim History: Melissa [name] did great with weight loss on her Lower carbohydrate plan. She missed berries, milk, and bread and she would like to go back on a Category plan. She states that she is going to start working out at the gym.  Subjective:   1. Pre-diabetes Melissa [name] is on metformin BID, and she is tolerating it well.  2. Other hyperlipidemia Melissa [name] is not on medications, and she denies chest pain. Last LDL was not at goal.  Assessment/Plan:   1. Pre-diabetes Melissa [name] will continue to work on weight loss, exercise, and decreasing simple carbohydrates to help decrease the risk of diabetes. We will refill metformin for 1 month.  - metFORMIN (GLUCOPHAGE) 500 MG tablet; Take 1 tablet (500 mg total) by mouth 2 (two) times daily.  Dispense: 60 tablet; Refill: 0  2. Other hyperlipidemia Cardiovascular risk and specific lipid/LDL goals reviewed. We discussed several lifestyle modifications today. Melissa [name] will continue her meal plan, and increase exercise and weight loss efforts. Orders and follow up as documented in patient record.   Counseling Intensive lifestyle modifications are the first line treatment for this issue. . Dietary changes: Increase soluble fiber. Decrease simple carbohydrates. . Exercise changes: Moderate to vigorous-intensity aerobic activity 150 minutes per week if tolerated. . Lipid-lowering  medications: see documented in medical record.  3. Class 3 severe obesity with serious comorbidity and body mass index (BMI) of 40.0 to 44.9 in adult, unspecified obesity type (HCC) Melissa [name] is currently in the action stage of change. As such, her goal is to continue with weight loss efforts. She has agreed to the Category 2 Plan.   Exercise goals: As is.  Behavioral modification strategies: meal planning and cooking strategies and keeping healthy foods in the home.  Melissa [name] has agreed to follow-up with our clinic in 4 weeks. She was informed of the importance of frequent follow-up visits to maximize her success with intensive lifestyle modifications for her multiple health conditions.   Objective:   Blood pressure 122/77, pulse 72, temperature 98.1 F (36.7 C), height 5\' 2"  (1.575 m), weight 210 lb (95.3 kg), SpO2 100 %. Body mass index is 38.41 kg/m.  General: Cooperative, alert, well developed, in no acute distress. HEENT: Conjunctivae and lids unremarkable. Cardiovascular: Regular rhythm.  Lungs: Normal work of breathing. Neurologic: No focal deficits.   Lab Results  Component Value Date   CREATININE 0.86 01/13/2021   BUN 15 01/13/2021   NA 140 01/13/2021   K 4.3 01/13/2021   CL 100 01/13/2021   CO2 22 01/13/2021   Lab Results  Component Value Date   ALT 15 01/13/2021   AST 15 01/13/2021   ALKPHOS 105 01/13/2021   BILITOT 0.3 01/13/2021   Lab Results  Component Value Date   HGBA1C 5.7 (H) 01/13/2021   HGBA1C 5.5 09/22/2020   HGBA1C 5.9 (H) 06/18/2020   Lab Results  Component Value Date   INSULIN 15.6 01/13/2021  INSULIN 10.7 09/22/2020   INSULIN 12.9 06/18/2020   INSULIN 23.2 11/18/2019   Lab Results  Component Value Date   TSH 0.351 (L) 01/13/2021   Lab Results  Component Value Date   CHOL 194 01/13/2021   HDL 62 01/13/2021   LDLCALC 116 (H) 01/13/2021   TRIG 91 01/13/2021   CHOLHDL 3.1 01/13/2021   Lab Results  Component Value Date   WBC 8.1  06/19/2020   HGB 13.2 06/19/2020   HCT 42.4 06/19/2020   MCV 88.3 06/19/2020   PLT 374 06/19/2020   No results found for: IRON, TIBC, FERRITIN  Obesity Behavioral Intervention:   Approximately 15 minutes were spent on the discussion below.  ASK: We discussed the diagnosis of obesity with [name] today and [name] agreed to give [name] permission to discuss obesity behavioral modification therapy today.  ASSESS: [name] has the diagnosis of obesity and her BMI today is 38.4. [name] is in the action stage of change.   ADVISE: [name] was educated on the multiple health risks of obesity as well as the benefit of weight loss to improve her health. She was advised of the need for long term treatment and the importance of lifestyle modifications to improve her current health and to decrease her risk of future health problems.  AGREE: Multiple dietary modification options and treatment options were discussed and [name] agreed to follow the recommendations documented in the above note.  ARRANGE: [name] was educated on the importance of frequent visits to treat obesity as outlined per CMS and USPSTF guidelines and agreed to schedule her next follow up appointment today.  Attestation Statements:   Reviewed by clinician on day of visit: allergies, medications, problem list, medical history, surgical history, family history, social history, and previous encounter notes.   Trude Mcburney, am acting as transcriptionist for [employer], PA-C.  I have reviewed the above documentation for accuracy and completeness, and I agree with the above. [name], PA-C

## 2021-03-18 DIAGNOSIS — M5416 Radiculopathy, lumbar region: Secondary | ICD-10-CM | POA: Diagnosis not present

## 2021-03-23 ENCOUNTER — Encounter (INDEPENDENT_AMBULATORY_CARE_PROVIDER_SITE_OTHER): Payer: Self-pay

## 2021-04-13 ENCOUNTER — Ambulatory Visit (INDEPENDENT_AMBULATORY_CARE_PROVIDER_SITE_OTHER): Payer: Medicare HMO | Admitting: Physician Assistant

## 2021-04-15 ENCOUNTER — Other Ambulatory Visit: Payer: Self-pay

## 2021-04-15 ENCOUNTER — Ambulatory Visit (INDEPENDENT_AMBULATORY_CARE_PROVIDER_SITE_OTHER): Payer: Medicare HMO | Admitting: Physician Assistant

## 2021-04-15 ENCOUNTER — Encounter (INDEPENDENT_AMBULATORY_CARE_PROVIDER_SITE_OTHER): Payer: Self-pay | Admitting: Physician Assistant

## 2021-04-15 VITALS — BP 106/69 | HR 82 | Temp 98.0°F | Ht 62.0 in | Wt 205.0 lb

## 2021-04-15 DIAGNOSIS — M5416 Radiculopathy, lumbar region: Secondary | ICD-10-CM | POA: Diagnosis not present

## 2021-04-15 DIAGNOSIS — R7303 Prediabetes: Secondary | ICD-10-CM

## 2021-04-15 DIAGNOSIS — Z6841 Body Mass Index (BMI) 40.0 and over, adult: Secondary | ICD-10-CM

## 2021-04-15 MED ORDER — METFORMIN HCL 500 MG PO TABS: 500.0000 mg | ORAL_TABLET | Freq: Two times a day (BID) | ORAL | 0 refills | Status: DC

## 2021-04-16 DIAGNOSIS — R7301 Impaired fasting glucose: Secondary | ICD-10-CM | POA: Diagnosis not present

## 2021-04-16 DIAGNOSIS — Z791 Long term (current) use of non-steroidal anti-inflammatories (NSAID): Secondary | ICD-10-CM | POA: Diagnosis not present

## 2021-04-16 DIAGNOSIS — F339 Major depressive disorder, recurrent, unspecified: Secondary | ICD-10-CM | POA: Diagnosis not present

## 2021-04-16 DIAGNOSIS — M8949 Other hypertrophic osteoarthropathy, multiple sites: Secondary | ICD-10-CM | POA: Diagnosis not present

## 2021-04-16 DIAGNOSIS — R69 Illness, unspecified: Secondary | ICD-10-CM | POA: Diagnosis not present

## 2021-04-16 DIAGNOSIS — E039 Hypothyroidism, unspecified: Secondary | ICD-10-CM | POA: Diagnosis not present

## 2021-04-16 DIAGNOSIS — E782 Mixed hyperlipidemia: Secondary | ICD-10-CM | POA: Diagnosis not present

## 2021-04-21 NOTE — Progress Notes (Signed)
Chief Complaint:   OBESITY Melissa [name] is here to discuss her progress with her obesity treatment plan along with follow-up of her obesity related diagnoses. Melissa [name] is on the Category 2 Plan and states she is following her eating plan approximately 92% of the time. Melissa [name] states she is active while doing home improvement and yard work for 2-3 hours 3-4 times per week.   Today's visit was #: 27 Starting weight: 256 lbs Starting date: 11/18/2019 Today's weight: 205 lbs Today's date: 04/15/2021 Total lbs lost to date: 51 Total lbs lost since last in-office visit: 5  Interim History: Melissa [name] reports that she has not been eating "as much junk". Her hunger is controlled. She is drinking more water. She will have labs drawn with her primary care provider tomorrow.  Subjective:   1. Pre-diabetes Melissa [name] reports that metformin is helping decrease her appetite. She denies nausea, vomiting, or diarrhea. Last A1c was 5.7.  Assessment/Plan:   1. Pre-diabetes Melissa [name] will continue to work on weight loss, exercise, and decreasing simple carbohydrates to help decrease the risk of diabetes. We will refill metformin for 1 month.  - metFORMIN (GLUCOPHAGE) 500 MG tablet; Take 1 tablet (500 mg total) by mouth 2 (two) times daily.  Dispense: 60 tablet; Refill: 0  2. Class 3 severe obesity with serious comorbidity and body mass index (BMI) of 40.0 to 44.9 in adult, unspecified obesity type (HCC) Melissa [name] is currently in the action stage of change. As such, her goal is to continue with weight loss efforts. She has agreed to the Category 2 Plan.   Exercise goals: As is.  Behavioral modification strategies: meal planning and cooking strategies and keeping healthy foods in the home.  Melissa [name] has agreed to follow-up with our clinic in 3 weeks. She was informed of the importance of frequent follow-up visits to maximize her success with intensive lifestyle modifications for her multiple health conditions.    Objective:   Blood pressure 106/69, pulse 82, temperature 98 F (36.7 C), height 5\' 2"  (1.575 m), weight 205 lb (93 kg), SpO2 97 %. Body mass index is 37.49 kg/m.  General: Cooperative, alert, well developed, in no acute distress. HEENT: Conjunctivae and lids unremarkable. Cardiovascular: Regular rhythm.  Lungs: Normal work of breathing. Neurologic: No focal deficits.   Lab Results  Component Value Date   CREATININE 0.86 01/13/2021   BUN 15 01/13/2021   NA 140 01/13/2021   K 4.3 01/13/2021   CL 100 01/13/2021   CO2 22 01/13/2021   Lab Results  Component Value Date   ALT 15 01/13/2021   AST 15 01/13/2021   ALKPHOS 105 01/13/2021   BILITOT 0.3 01/13/2021   Lab Results  Component Value Date   HGBA1C 5.7 (H) 01/13/2021   HGBA1C 5.5 09/22/2020   HGBA1C 5.9 (H) 06/18/2020   Lab Results  Component Value Date   INSULIN 15.6 01/13/2021   INSULIN 10.7 09/22/2020   INSULIN 12.9 06/18/2020   INSULIN 23.2 11/18/2019   Lab Results  Component Value Date   TSH 0.351 (L) 01/13/2021   Lab Results  Component Value Date   CHOL 194 01/13/2021   HDL 62 01/13/2021   LDLCALC 116 (H) 01/13/2021   TRIG 91 01/13/2021   CHOLHDL 3.1 01/13/2021   Lab Results  Component Value Date   VD25OH 69.7 01/13/2021   VD25OH 76.4 09/22/2020   VD25OH 49.1 06/18/2020   Lab Results  Component Value Date   WBC 8.1 06/19/2020   HGB 13.2 06/19/2020  HCT 42.4 06/19/2020   MCV 88.3 06/19/2020   PLT 374 06/19/2020   No results found for: IRON, TIBC, FERRITIN  Obesity Behavioral Intervention:   Approximately 15 minutes were spent on the discussion below.  ASK: We discussed the diagnosis of obesity with Melissa [name] today and Melissa [name] agreed to give [name] permission to discuss obesity behavioral modification therapy today.  ASSESS: Melissa [name] has the diagnosis of obesity and her BMI today is 37.49. Avory is in the action stage of change.   ADVISE: Melissa [name] was educated on the multiple health risks  of obesity as well as the benefit of weight loss to improve her health. She was advised of the need for long term treatment and the importance of lifestyle modifications to improve her current health and to decrease her risk of future health problems.  AGREE: Multiple dietary modification options and treatment options were discussed and Melissa [name] agreed to follow the recommendations documented in the above note.  ARRANGE: Melissa [name] was educated on the importance of frequent visits to treat obesity as outlined per CMS and USPSTF guidelines and agreed to schedule her next follow up appointment today.  Attestation Statements:   Reviewed by clinician on day of visit: allergies, medications, problem list, medical history, surgical history, family history, social history, and previous encounter notes.   Melissa [name], am acting as transcriptionist for [employer], PA-C.  I have reviewed the above documentation for accuracy and completeness, and I agree with the above. Melissa Cliche, PA-C

## 2021-05-04 IMAGING — CT CT SHOULDER*R* W/O CM
1 of 2 series · 9 of 14 positions shown, 12 images · non-contrast
Comparison: None.

CLINICAL DATA: Chronic right shoulder pain

EXAM:
CT OF THE UPPER RIGHT EXTREMITY WITHOUT CONTRAST
TECHNIQUE: Multidetector CT imaging of the upper right extremity was performed
according to the standard protocol.

[Series 5: thin soft · axial · 0.56mm/px · z∈[-219,-61]mm · 9 of 328 slices shown, 12 images]
[im 33/328  soft-tissue]
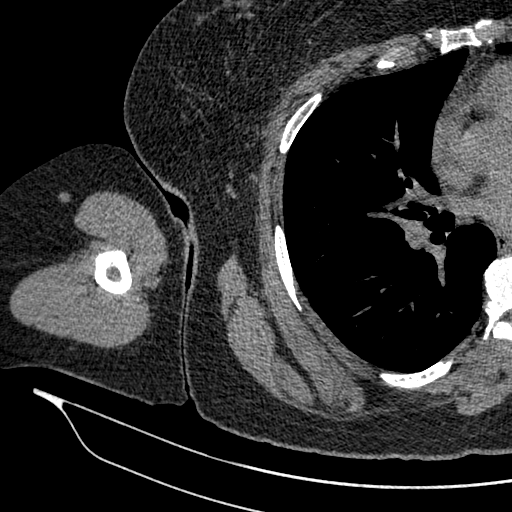
[im 33/328  bone]
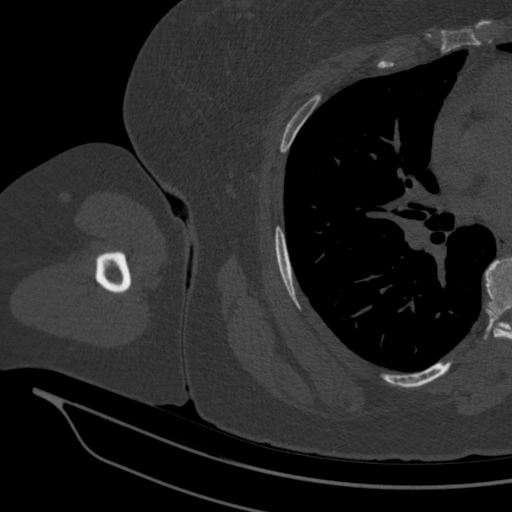
[im 66/328  bone]
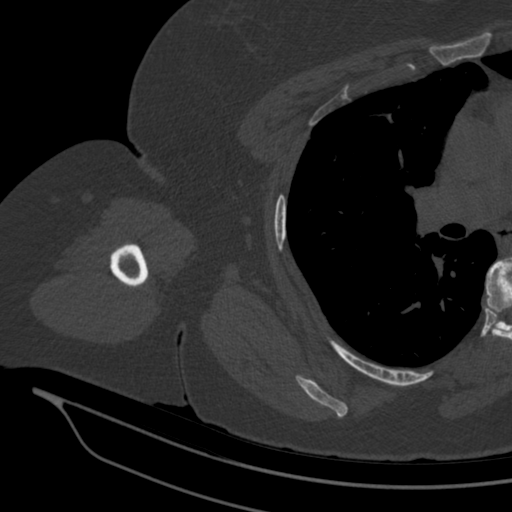
[im 99/328  bone]
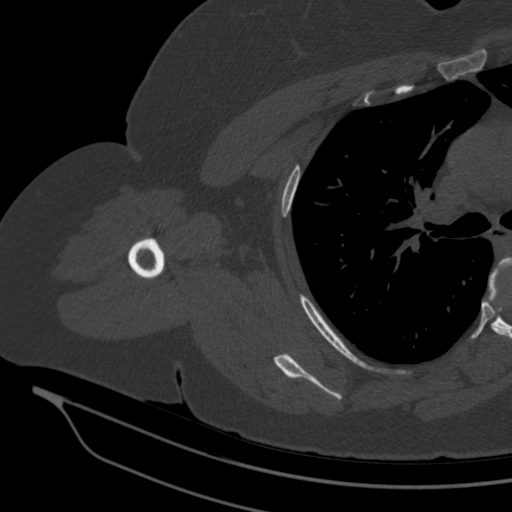
[im 131/328  bone]
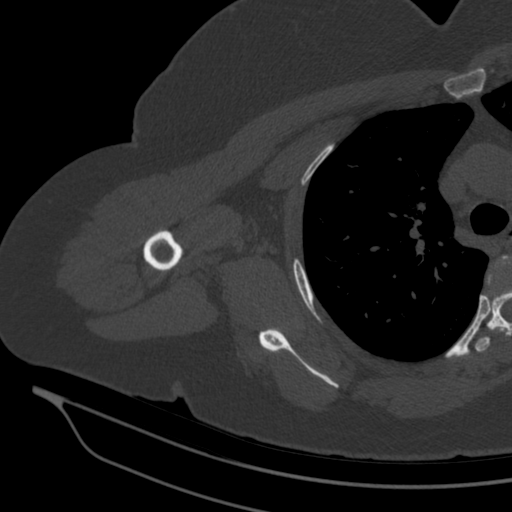
[im 164/328  soft-tissue]
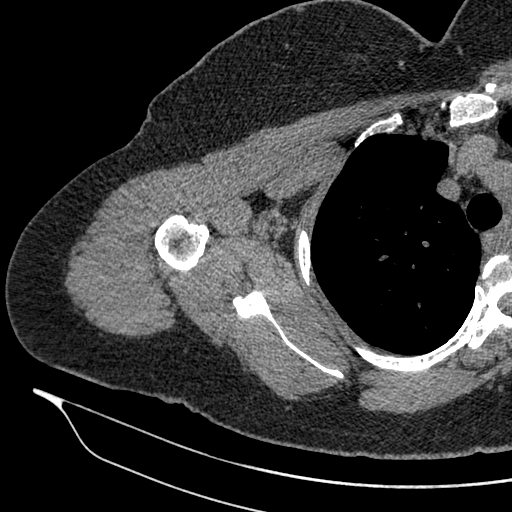
[im 164/328  bone]
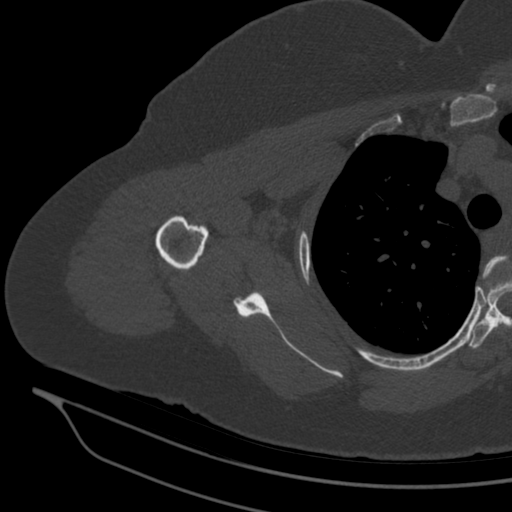
[im 197/328  bone]
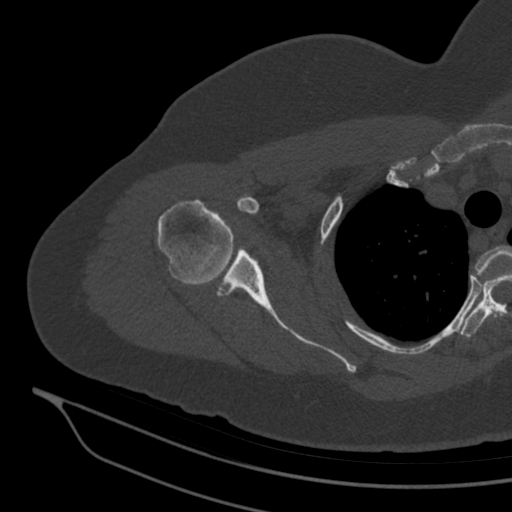
[im 229/328  bone]
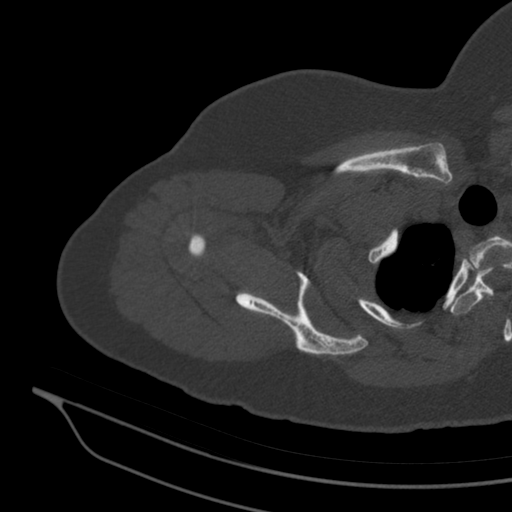
[im 262/328  bone]
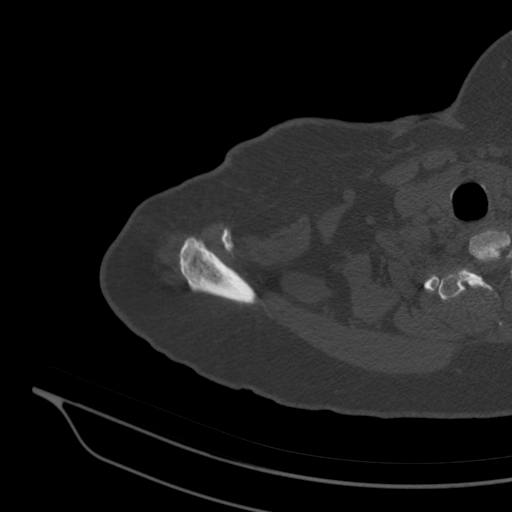
[im 295/328  soft-tissue]
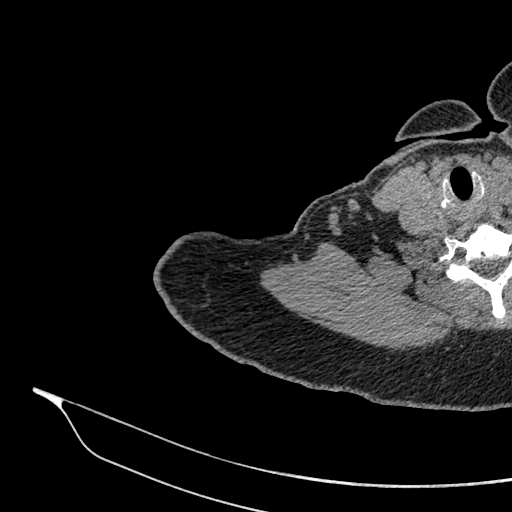
[im 295/328  bone]
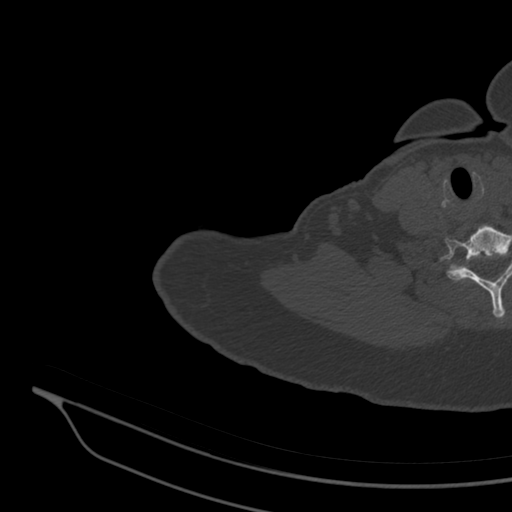

[9 of 14 positions shown; findings below may reference images not displayed]

FINDINGS: Bones/Joint/Cartilage

No acute fracture. No dislocation. Moderate glenohumeral joint
osteoarthritis manifested by mild joint space narrowing, subchondral
sclerosis/cystic change, and marginal osteophyte formation. There
are prominent subchondral cystic changes within the glenoid, most
pronounced posteriorly and inferiorly. Mild superior glenoid
retroversion. No large glenohumeral joint effusion is evident.

Mild degenerative changes of the acromioclavicular joint. No large
subacromial-subdeltoid bursal fluid collection. Remaining visualized
osseous structures appear grossly intact.

Ligaments

Suboptimally assessed by CT.

Muscles and Tendons

Preserved muscle bulk of the rotator cuff musculature without
significant atrophy or fatty infiltration. Rotator cuff tendons
appear grossly intact within the limitations of CT.

Soft tissues

No soft tissue fluid collection or hematoma. No axillary
lymphadenopathy. Visualized lung field is clear.
IMPRESSION: Moderate glenohumeral joint osteoarthritis with prominent glenoid
subchondral cysts.

## 2021-05-06 ENCOUNTER — Encounter (INDEPENDENT_AMBULATORY_CARE_PROVIDER_SITE_OTHER): Payer: Self-pay | Admitting: Physician Assistant

## 2021-05-06 ENCOUNTER — Other Ambulatory Visit: Payer: Self-pay

## 2021-05-06 ENCOUNTER — Ambulatory Visit (INDEPENDENT_AMBULATORY_CARE_PROVIDER_SITE_OTHER): Payer: Medicare HMO | Admitting: Physician Assistant

## 2021-05-06 VITALS — BP 117/71 | HR 78 | Temp 97.9°F | Ht 62.0 in | Wt 205.0 lb

## 2021-05-06 DIAGNOSIS — E7849 Other hyperlipidemia: Secondary | ICD-10-CM

## 2021-05-06 DIAGNOSIS — R7303 Prediabetes: Secondary | ICD-10-CM

## 2021-05-06 DIAGNOSIS — E559 Vitamin D deficiency, unspecified: Secondary | ICD-10-CM

## 2021-05-06 DIAGNOSIS — Z6841 Body Mass Index (BMI) 40.0 and over, adult: Secondary | ICD-10-CM | POA: Diagnosis not present

## 2021-05-06 MED ORDER — METFORMIN HCL 500 MG PO TABS: 500.0000 mg | ORAL_TABLET | Freq: Two times a day (BID) | ORAL | 0 refills | Status: DC

## 2021-05-07 LAB — LIPID PANEL
Chol/HDL Ratio: 2.9 ratio (ref 0.0–4.4)
Cholesterol, Total: 195 mg/dL (ref 100–199)
HDL: 68 mg/dL (ref >39)
LDL Chol Calc (NIH): 112 mg/dL — ABNORMAL HIGH (ref 0–99)
Triglycerides: 82 mg/dL (ref 0–149)
VLDL Cholesterol Cal: 15 mg/dL (ref 5–40)

## 2021-05-07 LAB — VITAMIN D 25 HYDROXY (VIT D DEFICIENCY, FRACTURES): Vit D, 25-Hydroxy: 77.1 ng/mL (ref 30.0–100.0)

## 2021-05-17 NOTE — Progress Notes (Signed)
Chief Complaint:   OBESITY Bela is here to discuss her progress with her obesity treatment plan along with follow-up of her obesity related diagnoses. Bonnetta is on the Category 2 Plan and states she is following her eating plan approximately 85% of the time. 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   Today's visit was #: 28 Starting weight: 256 lbs Starting date: 11/18/2019 Today's weight: 205 lbs Today's date: 05/06/2021 Total lbs lost to date: 51 Total lbs lost since last in-office visit: 0  Interim History: Tristen has been eating a lot of fresh vegetables and fruit. She reports that there are some days that she "grazed" all day and may have overeaten her calories and did not get enough protein.  Subjective:   1. Pre-diabetes Canna is on metformin twice daily, and she is tolerating it well. Last A1c was 5.5 last month with primary care physician.  2. Other hyperlipidemia Monicka's last LDL was elevated, and she denies chest pain. She is currently active.  3. Vitamin D deficiency Olanna is on OTC Vit D 4,000 units daily.  Assessment/Plan:   1. Pre-diabetes Flecia will continue to work on weight loss, exercise, and decreasing simple carbohydrates to help decrease the risk of diabetes. We will refill metformin for 1 month.  - metFORMIN (GLUCOPHAGE) 500 MG tablet; Take 1 tablet (500 mg total) by mouth 2 (two) times daily.  Dispense: 60 tablet; Refill: 0  2. Other hyperlipidemia Cardiovascular risk and specific lipid/LDL goals reviewed. We discussed several lifestyle modifications today. We will check labs today. Akaysha will continue to work on diet, exercise and weight loss efforts. Orders and follow up as documented in patient record.   Counseling Intensive lifestyle modifications are the first line treatment for this issue. Dietary changes: Increase soluble fiber. Decrease simple carbohydrates. Exercise changes: Moderate to  vigorous-intensity aerobic activity 150 minutes per week if tolerated. Lipid-lowering medications: see documented in medical record.  - Lipid panel  3. Vitamin D deficiency Low Vitamin D level contributes to fatigue and are associated with obesity, breast, and colon cancer. We will check labs today, and Leilany will continue taking Vitamin D 4,000 IU daily. She will follow-up for routine testing of Vitamin D, at least 2-3 times per year to avoid over-replacement.  - VITAMIN D 25 Hydroxy (Vit-D Deficiency, Fractures)  4. Class 3 severe obesity with serious comorbidity and body mass index (BMI) of 40.0 to 44.9 in adult, unspecified obesity type (HCC) Arilla is currently in the action stage of change. As such, her goal is to continue with weight loss efforts. She has agreed to the Category 2 Plan.   Exercise goals: As is.  Behavioral modification strategies: increasing lean protein intake and decreasing simple carbohydrates.  Genetta has agreed to follow-up with our clinic in 3 weeks. She was informed of the importance of frequent follow-up visits to maximize her success with intensive lifestyle modifications for her multiple health conditions.   Abbagail was informed we would discuss her lab results at her next visit unless there is a critical issue that needs to be addressed sooner. Dulcie agreed to keep her next visit at the agreed upon time to discuss these results.  Objective:   Blood pressure 117/71, pulse 78, temperature 97.9 F (36.6 C), height 5\' 2"  (1.575 m), weight 205 lb (93 kg), SpO2 99 %. Body mass index is 37.49 kg/m.  General: Cooperative, alert, well developed, in no acute distress.  HEENT: Conjunctivae and lids unremarkable. Cardiovascular: Regular rhythm.  Lungs: Normal work of breathing. Neurologic: No focal deficits.   Lab Results  Component Value Date   CREATININE 0.86 01/13/2021   BUN 15 01/13/2021   NA 140 01/13/2021   K 4.3 01/13/2021   CL 100 01/13/2021    CO2 22 01/13/2021   Lab Results  Component Value Date   ALT 15 01/13/2021   AST 15 01/13/2021   ALKPHOS 105 01/13/2021   BILITOT 0.3 01/13/2021   Lab Results  Component Value Date   HGBA1C 5.7 (H) 01/13/2021   HGBA1C 5.5 09/22/2020   HGBA1C 5.9 (H) 06/18/2020   Lab Results  Component Value Date   INSULIN 15.6 01/13/2021   INSULIN 10.7 09/22/2020   INSULIN 12.9 06/18/2020   INSULIN 23.2 11/18/2019   Lab Results  Component Value Date   TSH 0.351 (L) 01/13/2021   Lab Results  Component Value Date   CHOL 195 05/06/2021   HDL 68 05/06/2021   LDLCALC 112 (H) 05/06/2021   TRIG 82 05/06/2021   CHOLHDL 2.9 05/06/2021   Lab Results  Component Value Date   VD25OH 77.1 05/06/2021   VD25OH 69.7 01/13/2021   VD25OH 76.4 09/22/2020   Lab Results  Component Value Date   WBC 8.1 06/19/2020   HGB 13.2 06/19/2020   HCT 42.4 06/19/2020   MCV 88.3 06/19/2020   PLT 374 06/19/2020   No results found for: IRON, TIBC, FERRITIN  Obesity Behavioral Intervention:   Approximately 15 minutes were spent on the discussion below.  ASK: We discussed the diagnosis of obesity with [name] today and Atavia agreed to give [name] permission to discuss obesity behavioral modification therapy today.  ASSESS: Naketa has the diagnosis of obesity and her BMI today is 37.49. Serina is in the action stage of change.   ADVISE: Kyleeann was educated on the multiple health risks of obesity as well as the benefit of weight loss to improve her health. She was advised of the need for long term treatment and the importance of lifestyle modifications to improve her current health and to decrease her risk of future health problems.  AGREE: Multiple dietary modification options and treatment options were discussed and Arnette agreed to follow the recommendations documented in the above note.  ARRANGE: Kachina was educated on the importance of frequent visits to treat obesity as outlined per CMS and USPSTF  guidelines and agreed to schedule her next follow up appointment today.  Attestation Statements:   Reviewed by clinician on day of visit: allergies, medications, problem list, medical history, surgical history, family history, social history, and previous encounter notes.   Trude Mcburney, am acting as transcriptionist for [employer], PA-C.  I have reviewed the above documentation for accuracy and completeness, and I agree with the above. [name], PA-C

## 2021-05-27 ENCOUNTER — Encounter (INDEPENDENT_AMBULATORY_CARE_PROVIDER_SITE_OTHER): Payer: Self-pay | Admitting: Adult Health

## 2021-05-27 ENCOUNTER — Other Ambulatory Visit: Payer: Self-pay

## 2021-05-27 ENCOUNTER — Ambulatory Visit (INDEPENDENT_AMBULATORY_CARE_PROVIDER_SITE_OTHER): Payer: Medicare HMO | Admitting: Adult Health

## 2021-05-27 VITALS — BP 128/77 | HR 80 | Temp 97.7°F | Ht 62.0 in | Wt 204.0 lb

## 2021-05-27 DIAGNOSIS — M5416 Radiculopathy, lumbar region: Secondary | ICD-10-CM | POA: Diagnosis not present

## 2021-05-27 DIAGNOSIS — Z9189 Other specified personal risk factors, not elsewhere classified: Secondary | ICD-10-CM

## 2021-05-27 DIAGNOSIS — E7849 Other hyperlipidemia: Secondary | ICD-10-CM

## 2021-05-27 DIAGNOSIS — Z6841 Body Mass Index (BMI) 40.0 and over, adult: Secondary | ICD-10-CM

## 2021-05-27 DIAGNOSIS — R7303 Prediabetes: Secondary | ICD-10-CM | POA: Diagnosis not present

## 2021-05-27 DIAGNOSIS — E559 Vitamin D deficiency, unspecified: Secondary | ICD-10-CM | POA: Diagnosis not present

## 2021-05-28 DIAGNOSIS — M62830 Muscle spasm of back: Secondary | ICD-10-CM | POA: Diagnosis not present

## 2021-05-28 DIAGNOSIS — Z5181 Encounter for therapeutic drug level monitoring: Secondary | ICD-10-CM | POA: Diagnosis not present

## 2021-05-28 DIAGNOSIS — G894 Chronic pain syndrome: Secondary | ICD-10-CM | POA: Diagnosis not present

## 2021-05-28 DIAGNOSIS — Z79899 Other long term (current) drug therapy: Secondary | ICD-10-CM | POA: Diagnosis not present

## 2021-06-01 DIAGNOSIS — E785 Hyperlipidemia, unspecified: Secondary | ICD-10-CM | POA: Insufficient documentation

## 2021-06-01 DIAGNOSIS — E7849 Other hyperlipidemia: Secondary | ICD-10-CM | POA: Insufficient documentation

## 2021-06-01 DIAGNOSIS — M5416 Radiculopathy, lumbar region: Secondary | ICD-10-CM | POA: Insufficient documentation

## 2021-06-01 NOTE — Progress Notes (Signed)
Chief Complaint:   OBESITY Yanelis is here to discuss her progress with her obesity treatment plan along with follow-up of her obesity related diagnoses. Lujuana is on the Category 2 Plan and states she is following her eating plan approximately 90% of the time. 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    Internal goal:  Lose under 200 pounds, current weight 204 pounds.  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  Chronic pain followed closely by Dr. Ethelene Hal.  Subjective:   1. Pre-diabetes On 01/13/2021, A1c 5.7, worsened from 6.5 on 09/22/2020.  Sh eis on metformin 500 mg twice daily - tolerating well.  PCP drew A1c on 04/16/2021 - 5.5.  Lab Results  Component Value Date   HGBA1C 5.7 (H) 01/13/2021   Lab Results  Component Value Date   INSULIN 15.6 01/13/2021   INSULIN 10.7 09/22/2020   INSULIN 12.9 06/18/2020   INSULIN 23.2 11/18/2019   2. Lumbar radiculopathy She has lost 52 pounds!  PDMP reviewed - filling hydrocodone/acetaminophen 5/325 mg every 8 days.  Followed by Dr. Susanne Borders specialist.  3. Vitamin D deficiency Vitamin D level on 05/06/2021 - 77.1 - at goal.  She is currently taking OTC vitamin D 4,000 IU each day. She denies nausea, vomiting or muscle weakness.  Lab Results  Component Value Date   VD25OH 77.1 05/06/2021   VD25OH 69.7 01/13/2021   VD25OH 76.4 09/22/2020   4. Other hyperlipidemia She is on pravastatin 40 mg daily - denies myalgias.  On 05/06/2021, lipid panel stable.  Lab Results  Component Value Date   ALT 15 01/13/2021   AST 15 01/13/2021   ALKPHOS 105 01/13/2021   BILITOT 0.3 01/13/2021   Lab Results  Component Value Date   CHOL 195 05/06/2021   HDL 68 05/06/2021    LDLCALC 112 (H) 05/06/2021   TRIG 82 05/06/2021   CHOLHDL 2.9 05/06/2021   Assessment/Plan:   1. Pre-diabetes Continue metformin as directed.  2. Lumbar radiculopathy Continue with weight loss efforts.  Continue follow-up with Dr. Ethelene Hal.  3. Vitamin D deficiency Discussed labs with patient today.  Decrease OTC vitamin D to 3,000 IU daily.  4. Other hyperlipidemia Discussed labs with patient today.  Continue statin therapy.  5. Obesity, current BMI 37.3  Shalona is currently in the action stage of change. As such, her goal is to continue with weight loss efforts. She has agreed to the Category 2 Plan.   Exercise goals:  As is.  Behavioral modification strategies: increasing lean protein intake, decreasing simple carbohydrates, meal planning and cooking strategies, keeping healthy foods in the home, avoiding temptations, and planning for success.  Cailie has agreed to follow-up with our clinic in 2-3 weeks. She was informed of the importance of frequent follow-up visits to maximize her success with intensive lifestyle modifications for her multiple health conditions.   Objective:   Blood pressure 128/77, pulse 80, temperature 97.7 F (36.5 C), height 5\' 2"  (1.575 m), weight 204 lb (92.5 kg), SpO2 97 %. Body mass index is 37.31 kg/m.  General: Cooperative, alert, well developed, in no acute distress. HEENT: Conjunctivae and lids unremarkable. Cardiovascular: Regular rhythm.  Lungs: Normal work of breathing. Neurologic: No focal deficits.  Lab Results  Component Value Date   CREATININE 0.86 01/13/2021   BUN 15 01/13/2021   NA 140 01/13/2021   K 4.3 01/13/2021   CL 100 01/13/2021   CO2 22 01/13/2021   Lab Results  Component Value Date   ALT 15 01/13/2021   AST 15 01/13/2021   ALKPHOS 105 01/13/2021   BILITOT 0.3 01/13/2021   Lab Results  Component Value Date   HGBA1C 5.7 (H) 01/13/2021   HGBA1C 5.5 09/22/2020   HGBA1C 5.9 (H) 06/18/2020   Lab Results   Component Value Date   INSULIN 15.6 01/13/2021   INSULIN 10.7 09/22/2020   INSULIN 12.9 06/18/2020   INSULIN 23.2 11/18/2019   Lab Results  Component Value Date   TSH 0.351 (L) 01/13/2021   Lab Results  Component Value Date   CHOL 195 05/06/2021   HDL 68 05/06/2021   LDLCALC 112 (H) 05/06/2021   TRIG 82 05/06/2021   CHOLHDL 2.9 05/06/2021   Lab Results  Component Value Date   VD25OH 77.1 05/06/2021   VD25OH 69.7 01/13/2021   VD25OH 76.4 09/22/2020   Lab Results  Component Value Date   WBC 8.1 06/19/2020   HGB 13.2 06/19/2020   HCT 42.4 06/19/2020   MCV 88.3 06/19/2020   PLT 374 06/19/2020   Obesity Behavioral Intervention:   Approximately 15 minutes were spent on the discussion below.  ASK: We discussed the diagnosis of obesity with Natalia Leatherwood today and Natalia Leatherwood agreed to give [name] permission to discuss obesity behavioral modification therapy today.  ASSESS: Natalia Leatherwood has the diagnosis of obesity and her BMI today is 26.4. Natalia Leatherwood is in the action stage of change.   ADVISE: Natalia Leatherwood was educated on the multiple health risks of obesity as well as the benefit of weight loss to improve her health. She was advised of the need for long term treatment and the importance of lifestyle modifications to improve her current health and to decrease her risk of future health problems.  AGREE: Multiple dietary modification options and treatment options were discussed and Natalia Leatherwood agreed to follow the recommendations documented in the above note.  ARRANGE: Natalia Leatherwood was educated on the importance of frequent visits to treat obesity as outlined per CMS and USPSTF guidelines and agreed to schedule her next follow up appointment today.  Attestation Statements:   Reviewed by clinician on day of visit: allergies, medications, problem list, medical history, surgical history, family history, social history, and previous encounter notes.  I, Insurance claims handler, CMA, am acting as  Energy manager for [name], NP.  I have reviewed the above documentation for accuracy and completeness, and I agree with the above. -  Anniece Bleiler d. Dhruva Orndoff, NP-C

## 2021-06-21 ENCOUNTER — Other Ambulatory Visit: Payer: Self-pay

## 2021-06-21 ENCOUNTER — Ambulatory Visit (INDEPENDENT_AMBULATORY_CARE_PROVIDER_SITE_OTHER): Payer: Medicare HMO | Admitting: Adult Health

## 2021-06-21 ENCOUNTER — Encounter (INDEPENDENT_AMBULATORY_CARE_PROVIDER_SITE_OTHER): Payer: Self-pay | Admitting: Adult Health

## 2021-06-21 VITALS — BP 98/65 | HR 70 | Temp 97.6°F | Ht 62.0 in | Wt 207.0 lb

## 2021-06-21 DIAGNOSIS — R69 Illness, unspecified: Secondary | ICD-10-CM | POA: Diagnosis not present

## 2021-06-21 DIAGNOSIS — R7303 Prediabetes: Secondary | ICD-10-CM

## 2021-06-21 DIAGNOSIS — Z6841 Body Mass Index (BMI) 40.0 and over, adult: Secondary | ICD-10-CM

## 2021-06-21 DIAGNOSIS — F330 Major depressive disorder, recurrent, mild: Secondary | ICD-10-CM | POA: Diagnosis not present

## 2021-06-21 MED ORDER — METFORMIN HCL 500 MG PO TABS: 500.0000 mg | ORAL_TABLET | Freq: Two times a day (BID) | ORAL | 0 refills | Status: DC

## 2021-06-21 NOTE — Progress Notes (Signed)
Chief Complaint:   OBESITY Melissa [name] is here to discuss her progress with her obesity treatment plan along with follow-up of her obesity related diagnoses. Melissa [name] is on the Category 2 Plan and states she is following her eating plan approximately 80% of the time. Melissa [name] states she is not exercising regularly.  Today's visit was #: 30 Starting weight: 256 lbs Starting date: 11/18/2019 Today's weight: 207 lbs Today's date: 06/21/2021 Total lbs lost to date: 49 lbs Total lbs lost since last in-office visit: 0  Interim History: Melissa [name] reports an increase in fatigue, somnolence, and decrease in motivation.   She denies loss of hope or SI/HI.  It has been years since she has seen a therapist - amenable to referral to [facility]   She will eat off plan when depression symptoms worsen - i.e. comfort food - chips/sweets.  Subjective:   1. Pre-diabetes Melissa [name] has a diagnosis of prediabetes based on her elevated HgA1c and was informed this puts her at greater risk of developing diabetes. She continues to work on diet and exercise to decrease her risk of diabetes. She denies nausea or hypoglycemia.  On 01/13/2021, A1c was 5.7.  she is on metformin 500 mg twice daily - tolerating well.  Lab Results  Component Value Date   HGBA1C 5.7 (H) 01/13/2021   Lab Results  Component Value Date   INSULIN 15.6 01/13/2021   INSULIN 10.7 09/22/2020   INSULIN 12.9 06/18/2020   INSULIN 23.2 11/18/2019   2. Major depressive disorder, recurrent episode, mild with anxious distress (HCC) Melissa [name] reports an increase in fatigue, somnolence, and decrease in motivation.   She denies loss of hope or SI/HI.  It has been years since she has seen a therapist - amenable to referral to [facility].   She will eat off plan when depression symptoms worsen - i.e. comfort food - chips/sweets. She is on Bupropion XL 300mg  QD, Fluoxetine 20mg  QD, and Buspirone 5mg  BID PRN-anxiety. She has been on this medication regime for  years.   Assessment/Plan:   1. Pre-diabetes Melissa [name] will continue to work on weight loss, exercise, and decreasing simple carbohydrates to help decrease the risk of diabetes.  Check labs at next office visit.  Refill metformin 500 mg twice daily, as per below.   - Refill metFORMIN (GLUCOPHAGE) 500 MG tablet; Take 1 tablet (500 mg total) by mouth 2 (two) times daily.  Dispense: 60 tablet; Refill: 0  2. Major depressive disorder, recurrent episode, mild with anxious distress (HCC) Will place referral to Behavioral Health today. Continue on Bupropion XL 300mg  QD, Fluoxetine 20mg  QD, and Buspirone 5mg  BID PRN-anxiety.  - Ambulatory referral to Psychology  3. Obesity, current BMI 38.0  Melissa [name] is currently in the action stage of change. As such, her goal is to continue with weight loss efforts. She has agreed to the Category 2 Plan.   Exercise goals:  As is.  Behavioral modification strategies: increasing lean protein intake, decreasing simple carbohydrates, increasing water intake, meal planning and cooking strategies, keeping healthy foods in the home, and planning for success.  Melissa [name] has agreed to follow-up with our clinic in 3 weeks, fasting. She was informed of the importance of frequent follow-up visits to maximize her success with intensive lifestyle modifications for her multiple health conditions.   Objective:   Blood pressure 98/65, pulse 70, temperature 97.6 F (36.4 C), height 5\' 2"  (1.575 m), weight 207 lb (93.9 kg), SpO2 98 %. Body mass index is 37.86 kg/m.  General: Cooperative,  alert, well developed, in no acute distress. HEENT: Conjunctivae and lids unremarkable. Cardiovascular: Regular rhythm.  Lungs: Normal work of breathing. Neurologic: No focal deficits.   Lab Results  Component Value Date   CREATININE 0.86 01/13/2021   BUN 15 01/13/2021   NA 140 01/13/2021   K 4.3 01/13/2021   CL 100 01/13/2021   CO2 22 01/13/2021   Lab Results  Component Value Date    ALT 15 01/13/2021   AST 15 01/13/2021   ALKPHOS 105 01/13/2021   BILITOT 0.3 01/13/2021   Lab Results  Component Value Date   HGBA1C 5.7 (H) 01/13/2021   HGBA1C 5.5 09/22/2020   HGBA1C 5.9 (H) 06/18/2020   Lab Results  Component Value Date   INSULIN 15.6 01/13/2021   INSULIN 10.7 09/22/2020   INSULIN 12.9 06/18/2020   INSULIN 23.2 11/18/2019   Lab Results  Component Value Date   TSH 0.351 (L) 01/13/2021   Lab Results  Component Value Date   CHOL 195 05/06/2021   HDL 68 05/06/2021   LDLCALC 112 (H) 05/06/2021   TRIG 82 05/06/2021   CHOLHDL 2.9 05/06/2021   Lab Results  Component Value Date   VD25OH 77.1 05/06/2021   VD25OH 69.7 01/13/2021   VD25OH 76.4 09/22/2020   Lab Results  Component Value Date   WBC 8.1 06/19/2020   HGB 13.2 06/19/2020   HCT 42.4 06/19/2020   MCV 88.3 06/19/2020   PLT 374 06/19/2020   Obesity Behavioral Intervention:   Approximately 15 minutes were spent on the discussion below.  ASK: We discussed the diagnosis of obesity with Melissa [name] today and Melissa [name] agreed to give [name] permission to discuss obesity behavioral modification therapy today.  ASSESS: Melissa [name] has the diagnosis of obesity and her BMI today is 38.0. Melissa [name] is in the action stage of change.   ADVISE: Melissa [name] was educated on the multiple health risks of obesity as well as the benefit of weight loss to improve her health. She was advised of the need for long term treatment and the importance of lifestyle modifications to improve her current health and to decrease her risk of future health problems.  AGREE: Multiple dietary modification options and treatment options were discussed and Melissa [name] agreed to follow the recommendations documented in the above note.  ARRANGE: Melissa [name] was educated on the importance of frequent visits to treat obesity as outlined per CMS and USPSTF guidelines and agreed to schedule her next follow up appointment today.  Attestation Statements:   Reviewed by  clinician on day of visit: allergies, medications, problem list, medical history, surgical history, family history, social history, and previous encounter notes.  I, Insurance claims handler, CMA, am acting as Energy manager for [name], NP.  I have reviewed the above documentation for accuracy and completeness, and I agree with the above. - Felisa Zechman d. Kieth Hartis, NP-C

## 2021-06-25 DIAGNOSIS — M170 Bilateral primary osteoarthritis of knee: Secondary | ICD-10-CM | POA: Diagnosis not present

## 2021-07-02 DIAGNOSIS — G4733 Obstructive sleep apnea (adult) (pediatric): Secondary | ICD-10-CM | POA: Diagnosis not present

## 2021-07-02 DIAGNOSIS — G4700 Insomnia, unspecified: Secondary | ICD-10-CM | POA: Diagnosis not present

## 2021-07-13 DIAGNOSIS — J3089 Other allergic rhinitis: Secondary | ICD-10-CM | POA: Diagnosis not present

## 2021-07-13 DIAGNOSIS — R634 Abnormal weight loss: Secondary | ICD-10-CM | POA: Diagnosis not present

## 2021-07-13 DIAGNOSIS — G4733 Obstructive sleep apnea (adult) (pediatric): Secondary | ICD-10-CM | POA: Diagnosis not present

## 2021-07-13 DIAGNOSIS — Z9989 Dependence on other enabling machines and devices: Secondary | ICD-10-CM | POA: Diagnosis not present

## 2021-07-13 DIAGNOSIS — E6609 Other obesity due to excess calories: Secondary | ICD-10-CM | POA: Diagnosis not present

## 2021-07-13 DIAGNOSIS — Z6839 Body mass index (BMI) 39.0-39.9, adult: Secondary | ICD-10-CM | POA: Diagnosis not present

## 2021-07-15 ENCOUNTER — Ambulatory Visit (INDEPENDENT_AMBULATORY_CARE_PROVIDER_SITE_OTHER): Payer: Medicare HMO | Admitting: Adult Health

## 2021-07-15 ENCOUNTER — Other Ambulatory Visit: Payer: Self-pay

## 2021-07-15 ENCOUNTER — Encounter (INDEPENDENT_AMBULATORY_CARE_PROVIDER_SITE_OTHER): Payer: Self-pay | Admitting: Adult Health

## 2021-07-15 VITALS — BP 110/69 | HR 69 | Temp 97.7°F | Ht 62.0 in | Wt 206.0 lb

## 2021-07-15 DIAGNOSIS — E039 Hypothyroidism, unspecified: Secondary | ICD-10-CM | POA: Diagnosis not present

## 2021-07-15 DIAGNOSIS — R7303 Prediabetes: Secondary | ICD-10-CM | POA: Diagnosis not present

## 2021-07-15 DIAGNOSIS — Z6841 Body Mass Index (BMI) 40.0 and over, adult: Secondary | ICD-10-CM

## 2021-07-15 MED ORDER — METFORMIN HCL 500 MG PO TABS: ORAL_TABLET | ORAL | 0 refills | Status: DC

## 2021-07-15 NOTE — Progress Notes (Signed)
Chief Complaint:   OBESITY Melissa [name] is here to discuss her progress with her obesity treatment plan along with follow-up of her obesity related diagnoses. Melissa [name] is on the Category 2 Plan and states she is following her eating plan approximately 75% of the time. Melissa [name] states she is walking for 30 minutes 3 times per week.  Today's visit was #: 31 Starting weight: 256 lbs Starting date: 11/18/2019 Today's weight: 206 lbs Today's date: 07/15/2021 Total lbs lost to date: 50 lbs Total lbs lost since last in-office visit: 1 lb  Interim History: Melissa [name] has been consuming more carbohydrates (bread, pie, pasta). She has had 4 weeks of constipation: Previously 3 BM per day, now 1 BM per day with stool softener.   She denies abdominal pain, hematochezia, family history of colon cancer.  She is on Narcotics for chronic back pain.  Her pain mgt specialist recently started her on Linzess - she reports taking in evening with all of her other evening medications.  Subjective:   1. Pre-diabetes On metformin 500 mg - 2 tablets with dinner. She has experienced evening polyphagia. She denies diarrhea.  2. Hypothyroidism, unspecified type Worsening insomnia, constipation. She is on levothyroxine 150 mcg daily - managed by PCP. She denies palpitations, profuse sweating, or increased anxiety.  Lab Results  Component Value Date   TSH 0.351 (L) 01/13/2021   Assessment/Plan:   1. Pre-diabetes Change metformin to 2 tablets at lunch. Check labs today. Refill metformin 500 mg -  2 tablets at lunch.  - Refill metFORMIN (GLUCOPHAGE) 500 MG tablet; 2 tabs at lunch  Dispense: 60 tablet; Refill: 0 - Comprehensive metabolic panel - Hemoglobin A1c - Insulin, random  2. Hypothyroidism, unspecified type Check labs - forward results to PCP for levothyroxine management.  - TSH+T4F+T3Free  3. Obesity, current BMI 37.8  Melissa [name] is currently in the action stage of change. As such, her goal is  to continue with weight loss efforts. She has agreed to the Category 2 Plan.   Exercise goals:  As is.  Behavioral modification strategies: increasing lean protein intake, decreasing simple carbohydrates, meal planning and cooking strategies, keeping healthy foods in the home, and planning for success.  Melissa [name] has agreed to follow-up with our clinic in 3 weeks. She was informed of the importance of frequent follow-up visits to maximize her success with intensive lifestyle modifications for her multiple health conditions.   Objective:   Blood pressure 110/69, pulse 69, temperature 97.7 F (36.5 C), height 5\' 2"  (1.575 m), weight 206 lb (93.4 kg), SpO2 100 %. Body mass index is 37.68 kg/m.  General: Cooperative, alert, well developed, in no acute distress. HEENT: Conjunctivae and lids unremarkable. Cardiovascular: Regular rhythm.  Lungs: Normal work of breathing. Neurologic: No focal deficits.   Lab Results  Component Value Date   CREATININE 0.86 01/13/2021   BUN 15 01/13/2021   NA 140 01/13/2021   K 4.3 01/13/2021   CL 100 01/13/2021   CO2 22 01/13/2021   Lab Results  Component Value Date   ALT 15 01/13/2021   AST 15 01/13/2021   ALKPHOS 105 01/13/2021   BILITOT 0.3 01/13/2021   Lab Results  Component Value Date   HGBA1C 5.7 (H) 01/13/2021   HGBA1C 5.5 09/22/2020   HGBA1C 5.9 (H) 06/18/2020   Lab Results  Component Value Date   INSULIN 15.6 01/13/2021   INSULIN 10.7 09/22/2020   INSULIN 12.9 06/18/2020   INSULIN 23.2 11/18/2019   Lab Results  Component Value  Date   TSH 0.351 (L) 01/13/2021   Lab Results  Component Value Date   CHOL 195 05/06/2021   HDL 68 05/06/2021   LDLCALC 112 (H) 05/06/2021   TRIG 82 05/06/2021   CHOLHDL 2.9 05/06/2021   Lab Results  Component Value Date   VD25OH 77.1 05/06/2021   VD25OH 69.7 01/13/2021   VD25OH 76.4 09/22/2020   Lab Results  Component Value Date   WBC 8.1 06/19/2020   HGB 13.2 06/19/2020   HCT 42.4  06/19/2020   MCV 88.3 06/19/2020   PLT 374 06/19/2020   Obesity Behavioral Intervention:   Approximately 15 minutes were spent on the discussion below.  ASK: We discussed the diagnosis of obesity with Melissa [name] today and Melissa [name] agreed to give Melissa [name] permission to discuss obesity behavioral modification therapy today.  ASSESS: Melissa [name] has the diagnosis of obesity and her BMI today is 37.8. Melissa [name] is in the action stage of change.   ADVISE: Melissa [name] was educated on the multiple health risks of obesity as well as the benefit of weight loss to improve her health. She was advised of the need for long term treatment and the importance of lifestyle modifications to improve her current health and to decrease her risk of future health problems.  AGREE: Multiple dietary modification options and treatment options were discussed and Melissa [name] agreed to follow the recommendations documented in the above note.  ARRANGE: Melissa [name] was educated on the importance of frequent visits to treat obesity as outlined per CMS and USPSTF guidelines and agreed to schedule her next follow up appointment today.  Attestation Statements:   Reviewed by clinician on day of visit: allergies, medications, problem list, medical history, surgical history, family history, social history, and previous encounter notes.  I, Insurance claims handler, CMA, am acting as Energy manager for Melissa Hamburger, NP.  I have reviewed the above documentation for accuracy and completeness, and I agree with the above. -  Jerrie Gullo d. Deforest Maiden, NP-C

## 2021-07-16 ENCOUNTER — Ambulatory Visit (INDEPENDENT_AMBULATORY_CARE_PROVIDER_SITE_OTHER): Payer: Medicare HMO | Admitting: Psychologist

## 2021-07-16 DIAGNOSIS — F331 Major depressive disorder, recurrent, moderate: Secondary | ICD-10-CM | POA: Diagnosis not present

## 2021-07-16 DIAGNOSIS — R69 Illness, unspecified: Secondary | ICD-10-CM | POA: Diagnosis not present

## 2021-07-17 LAB — COMPREHENSIVE METABOLIC PANEL
ALT: 16 IU/L (ref 0–32)
AST: 13 IU/L (ref 0–40)
Albumin/Globulin Ratio: 1.8 (ref 1.2–2.2)
Albumin: 4.6 g/dL (ref 3.8–4.9)
Alkaline Phosphatase: 92 IU/L (ref 44–121)
BUN/Creatinine Ratio: 20 (ref 9–23)
BUN: 18 mg/dL (ref 6–24)
Bilirubin Total: 0.3 mg/dL (ref 0.0–1.2)
CO2: 24 mmol/L (ref 20–29)
Calcium: 10.1 mg/dL (ref 8.7–10.2)
Chloride: 103 mmol/L (ref 96–106)
Creatinine, Ser: 0.9 mg/dL (ref 0.57–1.00)
Globulin, Total: 2.6 g/dL (ref 1.5–4.5)
Glucose: 93 mg/dL (ref 65–99)
Potassium: 5.5 mmol/L — ABNORMAL HIGH (ref 3.5–5.2)
Sodium: 141 mmol/L (ref 134–144)
Total Protein: 7.2 g/dL (ref 6.0–8.5)
eGFR: 75 mL/min/{1.73_m2} (ref >59)

## 2021-07-17 LAB — TSH+T4F+T3FREE
Free T4: 1.68 ng/dL (ref 0.82–1.77)
T3, Free: 2.3 pg/mL (ref 2.0–4.4)
TSH: 0.903 u[IU]/mL (ref 0.450–4.500)

## 2021-07-17 LAB — HEMOGLOBIN A1C
Est. average glucose Bld gHb Est-mCnc: 117 mg/dL
Hgb A1c MFr Bld: 5.7 % — ABNORMAL HIGH (ref 4.8–5.6)

## 2021-07-17 LAB — INSULIN, RANDOM: INSULIN: 8.8 u[IU]/mL (ref 2.6–24.9)

## 2021-08-02 ENCOUNTER — Ambulatory Visit (INDEPENDENT_AMBULATORY_CARE_PROVIDER_SITE_OTHER): Payer: Medicare HMO | Admitting: Psychologist

## 2021-08-02 ENCOUNTER — Other Ambulatory Visit: Payer: Self-pay

## 2021-08-02 DIAGNOSIS — R69 Illness, unspecified: Secondary | ICD-10-CM | POA: Diagnosis not present

## 2021-08-02 DIAGNOSIS — F331 Major depressive disorder, recurrent, moderate: Secondary | ICD-10-CM

## 2021-08-03 DIAGNOSIS — H524 Presbyopia: Secondary | ICD-10-CM | POA: Diagnosis not present

## 2021-08-11 ENCOUNTER — Encounter (INDEPENDENT_AMBULATORY_CARE_PROVIDER_SITE_OTHER): Payer: Self-pay | Admitting: Adult Health

## 2021-08-11 ENCOUNTER — Other Ambulatory Visit: Payer: Self-pay

## 2021-08-11 ENCOUNTER — Ambulatory Visit (INDEPENDENT_AMBULATORY_CARE_PROVIDER_SITE_OTHER): Payer: Medicare HMO | Admitting: Adult Health

## 2021-08-11 VITALS — BP 121/73 | HR 74 | Temp 97.9°F | Ht 62.0 in | Wt 208.0 lb

## 2021-08-11 DIAGNOSIS — R7303 Prediabetes: Secondary | ICD-10-CM

## 2021-08-11 DIAGNOSIS — Z6841 Body Mass Index (BMI) 40.0 and over, adult: Secondary | ICD-10-CM | POA: Diagnosis not present

## 2021-08-11 DIAGNOSIS — E039 Hypothyroidism, unspecified: Secondary | ICD-10-CM | POA: Diagnosis not present

## 2021-08-11 DIAGNOSIS — E875 Hyperkalemia: Secondary | ICD-10-CM

## 2021-08-11 MED ORDER — METFORMIN HCL 500 MG PO TABS: ORAL_TABLET | ORAL | 0 refills | Status: DC

## 2021-08-11 NOTE — Progress Notes (Signed)
Chief Complaint:   OBESITY Melissa [name] is here to discuss her progress with her obesity treatment plan along with follow-up of her obesity related diagnoses. Melissa [name] is on the Category 2 Plan and states she is following her eating plan approximately 75% of the time. Melissa [name] states she is walking for 45 minutes 4-5 times per week.  Today's visit was #: 32 Starting weight: 256 lbs Starting date: 11/18/2019 Today's weight: 208 lbs Today's date: 08/11/2021 Total lbs lost to date: 48 lbs Total lbs lost since last in-office visit: 0  Interim History: The cold weather will trigger her to eat more and sleep more.  Melissa [name] has been eating out more frequently the last month.    Of note:  Sleep study scheduled for home in the next few weeks.  Subjective:   1. Hyperkalemia Worsening.  Discussed labs with patient today.  She denies chest pain/palpitations. Per patient, she has had previous elevated potassium in the past.  2. Hypothyroidism, unspecified type On 07/15/2021, thyroid panel - normal. She is on levothyroxine 150 mcg daily - been on this dose for 6 months - managed by PCP.  3. Pre-diabetes Discussed labs with patient today.  On 07/15/2021, BG 93, A1c 5.7, insulin level 8.8. She takes 2 tablets of metformin at lunch - still experiencing polyphagia in the late afternoon.   Unable to tolerate Rybelsus - GERD, nausea.  Assessment/Plan:   1. Hyperkalemia Reduce potassium rich foods. Recheck labs today - CMP.  - Comprehensive metabolic panel  2. Hypothyroidism, unspecified type Continue levothyroxine 150 mcg daily.  3. Pre-diabetes Refill metformin 500 mg 2 tablets daily at lunch.  Increase protein.  - Refill metFORMIN (GLUCOPHAGE) 500 MG tablet; 2 tabs at lunch  Dispense: 60 tablet; Refill: 0  4. Obesity, current BMI 38.1  Melissa [name] is currently in the action stage of change. As such, her goal is to continue with weight loss efforts. She has agreed to the Category 2 Plan.    Use chili to satisfy cravings for warm, comfort food.  Exercise goals:  As is.  Behavioral modification strategies: increasing lean protein intake, decreasing simple carbohydrates, decreasing eating out, meal planning and cooking strategies, keeping healthy foods in the home, better snacking choices, and keeping a strict food journal.  Melissa [name] has agreed to follow-up with our clinic in 2 weeks. She was informed of the importance of frequent follow-up visits to maximize her success with intensive lifestyle modifications for her multiple health conditions.   Objective:   Blood pressure 121/73, pulse 74, temperature 97.9 F (36.6 C), height 5\' 2"  (1.575 m), weight 208 lb (94.3 kg), SpO2 99 %. Body mass index is 38.04 kg/m.  General: Cooperative, alert, well developed, in no acute distress. HEENT: Conjunctivae and lids unremarkable. Cardiovascular: Regular rhythm.  Lungs: Normal work of breathing. Neurologic: No focal deficits.   Lab Results  Component Value Date   CREATININE 0.90 07/15/2021   BUN 18 07/15/2021   NA 141 07/15/2021   K 5.5 (H) 07/15/2021   CL 103 07/15/2021   CO2 24 07/15/2021   Lab Results  Component Value Date   ALT 16 07/15/2021   AST 13 07/15/2021   ALKPHOS 92 07/15/2021   BILITOT 0.3 07/15/2021   Lab Results  Component Value Date   HGBA1C 5.7 (H) 07/15/2021   HGBA1C 5.7 (H) 01/13/2021   HGBA1C 5.5 09/22/2020   HGBA1C 5.9 (H) 06/18/2020   Lab Results  Component Value Date   INSULIN 8.8 07/15/2021   INSULIN  15.6 01/13/2021   INSULIN 10.7 09/22/2020   INSULIN 12.9 06/18/2020   INSULIN 23.2 11/18/2019   Lab Results  Component Value Date   TSH 0.903 07/15/2021   Lab Results  Component Value Date   CHOL 195 05/06/2021   HDL 68 05/06/2021   LDLCALC 112 (H) 05/06/2021   TRIG 82 05/06/2021   CHOLHDL 2.9 05/06/2021   Lab Results  Component Value Date   VD25OH 77.1 05/06/2021   VD25OH 69.7 01/13/2021   VD25OH 76.4 09/22/2020   Lab Results   Component Value Date   WBC 8.1 06/19/2020   HGB 13.2 06/19/2020   HCT 42.4 06/19/2020   MCV 88.3 06/19/2020   PLT 374 06/19/2020   Obesity Behavioral Intervention:   Approximately 15 minutes were spent on the discussion below.  ASK: We discussed the diagnosis of obesity with Melissa [name] today and Melissa [name] agreed to give [name] permission to discuss obesity behavioral modification therapy today.  ASSESS: Melissa [name] has the diagnosis of obesity and her BMI today is 38.1. Melissa [name] is in the action stage of change.   ADVISE: Melissa [name] was educated on the multiple health risks of obesity as well as the benefit of weight loss to improve her health. She was advised of the need for long term treatment and the importance of lifestyle modifications to improve her current health and to decrease her risk of future health problems.  AGREE: Multiple dietary modification options and treatment options were discussed and Melissa [name] agreed to follow the recommendations documented in the above note.  ARRANGE: Melissa [name] was educated on the importance of frequent visits to treat obesity as outlined per CMS and USPSTF guidelines and agreed to schedule her next follow up appointment today.  Attestation Statements:   Reviewed by clinician on day of visit: allergies, medications, problem list, medical history, surgical history, family history, social history, and previous encounter notes.  I, Insurance claims handler, CMA, am acting as Energy manager for [name], NP.  I have reviewed the above documentation for accuracy and completeness, and I agree with the above. -  Lisanne Ponce d. Naysa Puskas, NP-C

## 2021-08-12 ENCOUNTER — Encounter (INDEPENDENT_AMBULATORY_CARE_PROVIDER_SITE_OTHER): Payer: Self-pay | Admitting: Adult Health

## 2021-08-12 LAB — COMPREHENSIVE METABOLIC PANEL
ALT: 13 IU/L (ref 0–32)
AST: 13 IU/L (ref 0–40)
Albumin/Globulin Ratio: 2.1 (ref 1.2–2.2)
Albumin: 5 g/dL — ABNORMAL HIGH (ref 3.8–4.9)
Alkaline Phosphatase: 99 IU/L (ref 44–121)
BUN/Creatinine Ratio: 22 (ref 9–23)
BUN: 20 mg/dL (ref 6–24)
Bilirubin Total: 0.3 mg/dL (ref 0.0–1.2)
CO2: 23 mmol/L (ref 20–29)
Calcium: 10.1 mg/dL (ref 8.7–10.2)
Chloride: 102 mmol/L (ref 96–106)
Creatinine, Ser: 0.92 mg/dL (ref 0.57–1.00)
Globulin, Total: 2.4 g/dL (ref 1.5–4.5)
Glucose: 95 mg/dL (ref 70–99)
Potassium: 5 mmol/L (ref 3.5–5.2)
Sodium: 140 mmol/L (ref 134–144)
Total Protein: 7.4 g/dL (ref 6.0–8.5)
eGFR: 73 mL/min/{1.73_m2} (ref >59)

## 2021-08-16 NOTE — Telephone Encounter (Signed)
FYI

## 2021-08-19 DIAGNOSIS — J3089 Other allergic rhinitis: Secondary | ICD-10-CM | POA: Diagnosis not present

## 2021-08-19 DIAGNOSIS — R634 Abnormal weight loss: Secondary | ICD-10-CM | POA: Diagnosis not present

## 2021-08-19 DIAGNOSIS — Z6839 Body mass index (BMI) 39.0-39.9, adult: Secondary | ICD-10-CM | POA: Diagnosis not present

## 2021-08-19 DIAGNOSIS — G4733 Obstructive sleep apnea (adult) (pediatric): Secondary | ICD-10-CM | POA: Diagnosis not present

## 2021-08-19 DIAGNOSIS — E6609 Other obesity due to excess calories: Secondary | ICD-10-CM | POA: Diagnosis not present

## 2021-08-19 DIAGNOSIS — Z9989 Dependence on other enabling machines and devices: Secondary | ICD-10-CM | POA: Diagnosis not present

## 2021-08-20 DIAGNOSIS — G4733 Obstructive sleep apnea (adult) (pediatric): Secondary | ICD-10-CM | POA: Diagnosis not present

## 2021-08-24 ENCOUNTER — Ambulatory Visit (INDEPENDENT_AMBULATORY_CARE_PROVIDER_SITE_OTHER): Payer: Medicare HMO | Admitting: Psychologist

## 2021-08-24 DIAGNOSIS — F331 Major depressive disorder, recurrent, moderate: Secondary | ICD-10-CM

## 2021-08-24 DIAGNOSIS — R69 Illness, unspecified: Secondary | ICD-10-CM | POA: Diagnosis not present

## 2021-08-25 ENCOUNTER — Ambulatory Visit (INDEPENDENT_AMBULATORY_CARE_PROVIDER_SITE_OTHER): Payer: Medicare HMO | Admitting: Adult Health

## 2021-08-25 DIAGNOSIS — U071 COVID-19: Secondary | ICD-10-CM | POA: Diagnosis not present

## 2021-09-08 ENCOUNTER — Ambulatory Visit: Payer: Medicare HMO | Admitting: Psychologist

## 2021-09-20 ENCOUNTER — Other Ambulatory Visit: Payer: Self-pay

## 2021-09-20 ENCOUNTER — Encounter (INDEPENDENT_AMBULATORY_CARE_PROVIDER_SITE_OTHER): Payer: Self-pay | Admitting: Adult Health

## 2021-09-20 ENCOUNTER — Ambulatory Visit (INDEPENDENT_AMBULATORY_CARE_PROVIDER_SITE_OTHER): Payer: Medicare HMO | Admitting: Adult Health

## 2021-09-20 VITALS — BP 123/70 | HR 82 | Temp 98.0°F | Ht 62.0 in | Wt 211.0 lb

## 2021-09-20 DIAGNOSIS — E66813 Obesity, class 3: Secondary | ICD-10-CM

## 2021-09-20 DIAGNOSIS — E875 Hyperkalemia: Secondary | ICD-10-CM

## 2021-09-20 DIAGNOSIS — R7303 Prediabetes: Secondary | ICD-10-CM | POA: Diagnosis not present

## 2021-09-20 DIAGNOSIS — Z9189 Other specified personal risk factors, not elsewhere classified: Secondary | ICD-10-CM

## 2021-09-20 DIAGNOSIS — M5030 Other cervical disc degeneration, unspecified cervical region: Secondary | ICD-10-CM | POA: Diagnosis not present

## 2021-09-20 DIAGNOSIS — M5416 Radiculopathy, lumbar region: Secondary | ICD-10-CM | POA: Diagnosis not present

## 2021-09-20 DIAGNOSIS — G8929 Other chronic pain: Secondary | ICD-10-CM

## 2021-09-20 DIAGNOSIS — M545 Low back pain, unspecified: Secondary | ICD-10-CM | POA: Diagnosis not present

## 2021-09-20 DIAGNOSIS — M5414 Radiculopathy, thoracic region: Secondary | ICD-10-CM | POA: Diagnosis not present

## 2021-09-20 MED ORDER — METFORMIN HCL 500 MG PO TABS: ORAL_TABLET | ORAL | 0 refills | Status: DC

## 2021-09-21 NOTE — Progress Notes (Signed)
Chief Complaint:   OBESITY Melissa [name] is here to discuss her progress with her obesity treatment plan along with follow-up of her obesity related diagnoses. Melissa [name] is on the Category 2 Plan and states she is following her eating plan approximately 80% of the time. Melissa [name] states she is not exercising regularly at this time.  Today's visit was #: 17 Starting weight: 256 lbs Starting date: 11/18/2019 Today's weight: 211 lbs Today's date: 09/20/2021 Total lbs lost to date: 45 lbs Total lbs lost since last in-office visit: 0  Interim History:  Melissa [name] repots mid October, she had a COVID-19 infection - pharyngitis, nasal congestion, low grade fever, cough, body aches.   Denies any current symptoms. She maintained 80% compliance on the eating plan, even when acutely ill.  Subjective:   1. Pre-diabetes On 07/15/2021, A1c was 5.7.  2. Hyperkalemia On 08/11/2021, CMP showed potassium of 5.0. She denies acute cardiac symptoms.  3. Chronic low back pain, unspecified back pain laterality, unspecified whether sciatica present She recently increased Norco 5/325 from daily to BID - followed by Dr. Nelva Bush at [facility].  Assessment/Plan:   1. Pre-diabetes Refill metformin 500 mg 1 tablet with breakfast and 1 tablet with lunch.  - Refill metFORMIN (GLUCOPHAGE) 500 MG tablet; 1 tab with breakfast and 1 tab with lunch  Dispense: 60 tablet; Refill: 0  2. Hyperkalemia Monitor labs.  3. Chronic low back pain, unspecified back pain laterality, unspecified whether sciatica present Continue with Dr. Nelva Bush at [facility]. Continue with weight loss efforts.  4. Obesity, current BMI 38.7  Melissa [name] is currently in the action stage of change. As such, her goal is to continue with weight loss efforts. She has agreed to the Category 2 Plan and following a lower carbohydrate, vegetable and lean protein rich diet plan.   Obtain influenza vaccination.  Convert from Category 2 to Low  Carbohydrate.  Exercise goals: No exercise has been prescribed at this time.  Pain limits exercise.  Behavioral modification strategies: increasing lean protein intake, decreasing simple carbohydrates, meal planning and cooking strategies, keeping healthy foods in the home, and planning for success.  Melissa [name] has agreed to follow-up with our clinic in 2 weeks. She was informed of the importance of frequent follow-up visits to maximize her success with intensive lifestyle modifications for her multiple health conditions.   Objective:   Blood pressure 123/70, pulse 82, temperature 98 F (36.7 C), height 5\' 2"  (1.575 m), weight 211 lb (95.7 kg), SpO2 98 %. Body mass index is 38.59 kg/m.  General: Cooperative, alert, well developed, in no acute distress. HEENT: Conjunctivae and lids unremarkable. Cardiovascular: Regular rhythm.  Lungs: Normal work of breathing. Neurologic: No focal deficits.   Lab Results  Component Value Date   CREATININE 0.92 08/11/2021   BUN 20 08/11/2021   NA 140 08/11/2021   K 5.0 08/11/2021   CL 102 08/11/2021   CO2 23 08/11/2021   Lab Results  Component Value Date   ALT 13 08/11/2021   AST 13 08/11/2021   ALKPHOS 99 08/11/2021   BILITOT 0.3 08/11/2021   Lab Results  Component Value Date   HGBA1C 5.7 (H) 07/15/2021   HGBA1C 5.7 (H) 01/13/2021   HGBA1C 5.5 09/22/2020   HGBA1C 5.9 (H) 06/18/2020   Lab Results  Component Value Date   INSULIN 8.8 07/15/2021   INSULIN 15.6 01/13/2021   INSULIN 10.7 09/22/2020   INSULIN 12.9 06/18/2020   INSULIN 23.2 11/18/2019   Lab Results  Component Value Date  TSH 0.903 07/15/2021   Lab Results  Component Value Date   CHOL 195 05/06/2021   HDL 68 05/06/2021   LDLCALC 112 (H) 05/06/2021   TRIG 82 05/06/2021   CHOLHDL 2.9 05/06/2021   Lab Results  Component Value Date   VD25OH 77.1 05/06/2021   VD25OH 69.7 01/13/2021   VD25OH 76.4 09/22/2020   Lab Results  Component Value Date   WBC 8.1 06/19/2020    HGB 13.2 06/19/2020   HCT 42.4 06/19/2020   MCV 88.3 06/19/2020   PLT 374 06/19/2020   Obesity Behavioral Intervention:   Approximately 15 minutes were spent on the discussion below.  ASK: We discussed the diagnosis of obesity with Melissa [name] today and Melissa [name] agreed to give [name] permission to discuss obesity behavioral modification therapy today.  ASSESS: Melissa [name] has the diagnosis of obesity and her BMI today is 38.7. Melissa [name] is in the action stage of change.   ADVISE: Melissa [name] was educated on the multiple health risks of obesity as well as the benefit of weight loss to improve her health. She was advised of the need for long term treatment and the importance of lifestyle modifications to improve her current health and to decrease her risk of future health problems.  AGREE: Multiple dietary modification options and treatment options were discussed and Melissa [name] agreed to follow the recommendations documented in the above note.  ARRANGE: Melissa [name] was educated on the importance of frequent visits to treat obesity as outlined per CMS and USPSTF guidelines and agreed to schedule her next follow up appointment today.  Attestation Statements:   Reviewed by clinician on day of visit: allergies, medications, problem list, medical history, surgical history, family history, social history, and previous encounter notes.  I, Insurance claims handler, CMA, am acting as Energy manager for [name], NP.  I have reviewed the above documentation for accuracy and completeness, and I agree with the above. -  Shemeika Starzyk d. Cathlin Buchan, NP-C

## 2021-10-06 ENCOUNTER — Other Ambulatory Visit: Payer: Self-pay

## 2021-10-06 ENCOUNTER — Ambulatory Visit (INDEPENDENT_AMBULATORY_CARE_PROVIDER_SITE_OTHER): Payer: Medicare HMO | Admitting: Adult Health

## 2021-10-06 ENCOUNTER — Encounter (INDEPENDENT_AMBULATORY_CARE_PROVIDER_SITE_OTHER): Payer: Self-pay | Admitting: Adult Health

## 2021-10-06 VITALS — BP 123/71 | HR 71 | Temp 98.0°F | Ht 62.0 in | Wt 208.0 lb

## 2021-10-06 DIAGNOSIS — Z6841 Body Mass Index (BMI) 40.0 and over, adult: Secondary | ICD-10-CM | POA: Diagnosis not present

## 2021-10-06 DIAGNOSIS — K5903 Drug induced constipation: Secondary | ICD-10-CM

## 2021-10-06 DIAGNOSIS — R7303 Prediabetes: Secondary | ICD-10-CM | POA: Diagnosis not present

## 2021-10-07 NOTE — Progress Notes (Signed)
Chief Complaint:   OBESITY Melissa [name] is here to discuss her progress with her obesity treatment plan along with follow-up of her obesity related diagnoses. Melissa [name] is on the Category 2 Plan and following a lower carbohydrate, vegetable and lean protein rich diet plan and states she is following her eating plan approximately 70% of the time. Melissa [name] states she is not exercising regularly at this time.  Today's visit was #: 14 Starting weight: 256 lbs Starting date: 11/18/2019 Today's weight: 208 lbs Today's date: 10/06/2021 Total lbs lost to date: 48 lbs Total lbs lost since last in-office visit: 3 lbs  Interim History:  Melissa [name] is disappointed that she followed the low carbohydrate eating plan 70%. Her mother will prepare a low carbohydrate Christmas dinner for her- very thoughtful! Typical day of intact: Breakfast - eggs with cheese/mushrooms. Lunch - meat and vegetables. Dinner - meat and vegetables. Often a sweet treat after lunch.  Subjective:   1. Pre-diabetes She is on metformin - 500 mg BID - tolerating well. She denies polyphagia.  2. Drug-induced constipation Dr. Nelva Bush increased Linzess from 72 mcg to 145 mcg. She is on Norco 5/325 mg QHS - chronic lumbar back pain.  Assessment/Plan:   1. Pre-diabetes Continue metformin.  No need for refill.  2. Drug-induced constipation Continue with orthopedic specialist and activity as tolerated.  3. Obesity, current BMI 38.1  Melissa [name] is currently in the action stage of change. As such, her goal is to continue with weight loss efforts. She has agreed to following a lower carbohydrate, vegetable and lean protein rich diet plan.   Exercise goals:  As is.  Behavioral modification strategies: increasing lean protein intake, meal planning and cooking strategies, keeping healthy foods in the home, and planning for success.  Melissa [name] has agreed to follow-up with our clinic in 2 weeks. She was informed of the importance of frequent  follow-up visits to maximize her success with intensive lifestyle modifications for her multiple health conditions.   Objective:   Blood pressure 123/71, pulse 71, temperature 98 F (36.7 C), height 5\' 2"  (1.575 m), weight 208 lb (94.3 kg), SpO2 99 %. Body mass index is 38.04 kg/m.  General: Cooperative, alert, well developed, in no acute distress. HEENT: Conjunctivae and lids unremarkable. Cardiovascular: Regular rhythm.  Lungs: Normal work of breathing. Neurologic: No focal deficits.   Lab Results  Component Value Date   CREATININE 0.92 08/11/2021   BUN 20 08/11/2021   NA 140 08/11/2021   K 5.0 08/11/2021   CL 102 08/11/2021   CO2 23 08/11/2021   Lab Results  Component Value Date   ALT 13 08/11/2021   AST 13 08/11/2021   ALKPHOS 99 08/11/2021   BILITOT 0.3 08/11/2021   Lab Results  Component Value Date   HGBA1C 5.7 (H) 07/15/2021   HGBA1C 5.7 (H) 01/13/2021   HGBA1C 5.5 09/22/2020   HGBA1C 5.9 (H) 06/18/2020   Lab Results  Component Value Date   INSULIN 8.8 07/15/2021   INSULIN 15.6 01/13/2021   INSULIN 10.7 09/22/2020   INSULIN 12.9 06/18/2020   INSULIN 23.2 11/18/2019   Lab Results  Component Value Date   TSH 0.903 07/15/2021   Lab Results  Component Value Date   CHOL 195 05/06/2021   HDL 68 05/06/2021   LDLCALC 112 (H) 05/06/2021   TRIG 82 05/06/2021   CHOLHDL 2.9 05/06/2021   Lab Results  Component Value Date   VD25OH 77.1 05/06/2021   VD25OH 69.7 01/13/2021   VD25OH 76.4  09/22/2020   Lab Results  Component Value Date   WBC 8.1 06/19/2020   HGB 13.2 06/19/2020   HCT 42.4 06/19/2020   MCV 88.3 06/19/2020   PLT 374 06/19/2020   Attestation Statements:   Reviewed by clinician on day of visit: allergies, medications, problem list, medical history, surgical history, family history, social history, and previous encounter notes.  Time spent on visit including pre-visit chart review and post-visit care and charting was 28 minutes.   I, Comptroller, CMA, am acting as Energy manager for [name], NP.  I have reviewed the above documentation for accuracy and completeness, and I agree with the above. - Baylon Santelli d. Preeya Cleckley, NP-C

## 2021-10-15 DIAGNOSIS — E039 Hypothyroidism, unspecified: Secondary | ICD-10-CM | POA: Diagnosis not present

## 2021-10-15 DIAGNOSIS — G8929 Other chronic pain: Secondary | ICD-10-CM | POA: Diagnosis not present

## 2021-10-15 DIAGNOSIS — Z0000 Encounter for general adult medical examination without abnormal findings: Secondary | ICD-10-CM | POA: Diagnosis not present

## 2021-10-15 DIAGNOSIS — M159 Polyosteoarthritis, unspecified: Secondary | ICD-10-CM | POA: Diagnosis not present

## 2021-10-15 DIAGNOSIS — R7301 Impaired fasting glucose: Secondary | ICD-10-CM | POA: Diagnosis not present

## 2021-10-15 DIAGNOSIS — M545 Low back pain, unspecified: Secondary | ICD-10-CM | POA: Diagnosis not present

## 2021-10-15 DIAGNOSIS — E782 Mixed hyperlipidemia: Secondary | ICD-10-CM | POA: Diagnosis not present

## 2021-10-15 DIAGNOSIS — F339 Major depressive disorder, recurrent, unspecified: Secondary | ICD-10-CM | POA: Diagnosis not present

## 2021-10-15 DIAGNOSIS — R69 Illness, unspecified: Secondary | ICD-10-CM | POA: Diagnosis not present

## 2021-10-20 ENCOUNTER — Ambulatory Visit (INDEPENDENT_AMBULATORY_CARE_PROVIDER_SITE_OTHER): Payer: Medicare HMO | Admitting: Adult Health

## 2021-10-20 ENCOUNTER — Other Ambulatory Visit: Payer: Self-pay

## 2021-10-20 ENCOUNTER — Encounter (INDEPENDENT_AMBULATORY_CARE_PROVIDER_SITE_OTHER): Payer: Self-pay | Admitting: Adult Health

## 2021-10-20 VITALS — BP 112/74 | HR 78 | Temp 97.9°F | Ht 62.0 in | Wt 214.0 lb

## 2021-10-20 DIAGNOSIS — Z6841 Body Mass Index (BMI) 40.0 and over, adult: Secondary | ICD-10-CM

## 2021-10-20 DIAGNOSIS — E7849 Other hyperlipidemia: Secondary | ICD-10-CM | POA: Diagnosis not present

## 2021-10-20 DIAGNOSIS — R7303 Prediabetes: Secondary | ICD-10-CM

## 2021-10-20 DIAGNOSIS — E039 Hypothyroidism, unspecified: Secondary | ICD-10-CM | POA: Diagnosis not present

## 2021-10-20 DIAGNOSIS — Z9189 Other specified personal risk factors, not elsewhere classified: Secondary | ICD-10-CM | POA: Diagnosis not present

## 2021-10-20 MED ORDER — METFORMIN HCL 500 MG PO TABS: ORAL_TABLET | ORAL | 0 refills | Status: DC

## 2021-10-20 NOTE — Progress Notes (Signed)
Chief Complaint:   OBESITY Melissa [name] is here to discuss her progress with her obesity treatment plan along with follow-up of her obesity related diagnoses. Melissa [name] is on the Category 2 Plan and states she is following her eating plan approximately 70% of the time. Melissa [name] states she is doing increased activity.  Today's visit was #: 67 Starting weight: 256 lbs Starting date: 11/18/2019 Today's weight: 214 lbs Today's date: 10/20/2021 Total lbs lost to date: 42 lbs Total lbs lost since last in-office visit: 0  Interim History:  Melissa [name] says that the holidays were less than great - 2 prominent deaths at her church. Best friend has a broken foot - brace/Cam boot.   [place]2 [place] on house. Unable to travel home due to her friend's injury (broken foot).  Subjective:   1. Pre-diabetes On 10/15/2021, A1c 5.7 with PCP. She is on metformin 500 mg BID - tolerating well.  2. Hypothyroidism, unspecified type On 10/15/2021 - TSH 0.242 - suppressed. Levothyroxine 150 mcg decrease to 125 mcg by PCP.  3. Other hyperlipidemia On 10/15/2021, LDL 131, total 221.  PCP increased pravastatin from 40 mg to 80 mg.  4. At risk for heart disease Melissa [name] is at a higher than average risk for cardiovascular disease due to worsening hyperlipidemia, prediabetes, and obesity.   Assessment/Plan:   1. Pre-diabetes Refill metformin 500 mg 1 tablet with breakfast, 1 tablet with lunch daily.  - Refill metFORMIN (GLUCOPHAGE) 500 MG tablet; 1 tab with breakfast and 1 tab with lunch  Dispense: 60 tablet; Refill: 0  2. Hypothyroidism, unspecified type Take decreased levothyroxine dose.  Follow up with PCP as directed.  3. Other hyperlipidemia Take increased statin.  Follow up with PCP as directed.  4. At risk for heart disease Melissa [name] was given approximately 15 minutes of coronary artery disease prevention counseling today. She is 55 y.o. female and has risk factors for heart disease  including obesity. We discussed intensive lifestyle modifications today with an emphasis on specific weight loss instructions and strategies.   Repetitive spaced learning was employed today to elicit superior memory formation and behavioral change.  5. Obesity, current BMI 39.1  Melissa [name] is currently in the action stage of change. As such, her goal is to continue with weight loss efforts. She has agreed to the Category 2 Plan.   Exercise goals:  NEAT activities.  Behavioral modification strategies: increasing lean protein intake, decreasing simple carbohydrates, meal planning and cooking strategies, keeping healthy foods in the home, and planning for success.  Melissa [name] has agreed to follow-up with our clinic in 2 weeks. She was informed of the importance of frequent follow-up visits to maximize her success with intensive lifestyle modifications for her multiple health conditions.   Objective:   Blood pressure 112/74, pulse 78, temperature 97.9 F (36.6 C), temperature source Oral, height 5\' 2"  (1.575 m), weight 214 lb (97.1 kg), SpO2 99 %. Body mass index is 39.14 kg/m.  General: Cooperative, alert, well developed, in no acute distress. HEENT: Conjunctivae and lids unremarkable. Cardiovascular: Regular rhythm.  Lungs: Normal work of breathing. Neurologic: No focal deficits.   Lab Results  Component Value Date   CREATININE 0.92 08/11/2021   BUN 20 08/11/2021   NA 140 08/11/2021   K 5.0 08/11/2021   CL 102 08/11/2021   CO2 23 08/11/2021   Lab Results  Component Value Date   ALT 13 08/11/2021   AST 13 08/11/2021   ALKPHOS 99 08/11/2021  BILITOT 0.3 08/11/2021   Lab Results  Component Value Date   HGBA1C 5.7 (H) 07/15/2021   HGBA1C 5.7 (H) 01/13/2021   HGBA1C 5.5 09/22/2020   HGBA1C 5.9 (H) 06/18/2020   Lab Results  Component Value Date   INSULIN 8.8 07/15/2021   INSULIN 15.6 01/13/2021   INSULIN 10.7 09/22/2020   INSULIN 12.9 06/18/2020   INSULIN 23.2 11/18/2019    Lab Results  Component Value Date   TSH 0.903 07/15/2021   Lab Results  Component Value Date   CHOL 195 05/06/2021   HDL 68 05/06/2021   LDLCALC 112 (H) 05/06/2021   TRIG 82 05/06/2021   CHOLHDL 2.9 05/06/2021   Lab Results  Component Value Date   VD25OH 77.1 05/06/2021   VD25OH 69.7 01/13/2021   VD25OH 76.4 09/22/2020   Lab Results  Component Value Date   WBC 8.1 06/19/2020   HGB 13.2 06/19/2020   HCT 42.4 06/19/2020   MCV 88.3 06/19/2020   PLT 374 06/19/2020   Attestation Statements:   Reviewed by clinician on day of visit: allergies, medications, problem list, medical history, surgical history, family history, social history, and previous encounter notes.  I, Insurance claims handler, CMA, am acting as Energy manager for [name], NP.  I have reviewed the above documentation for accuracy and completeness, and I agree with the above. - Demi Trieu d. Reina Wilton, NP-C

## 2021-11-03 ENCOUNTER — Other Ambulatory Visit: Payer: Self-pay

## 2021-11-03 ENCOUNTER — Ambulatory Visit (INDEPENDENT_AMBULATORY_CARE_PROVIDER_SITE_OTHER): Payer: Medicare HMO | Admitting: Adult Health

## 2021-11-03 ENCOUNTER — Encounter (INDEPENDENT_AMBULATORY_CARE_PROVIDER_SITE_OTHER): Payer: Self-pay | Admitting: Adult Health

## 2021-11-03 VITALS — BP 113/71 | HR 84 | Temp 97.8°F | Ht 62.0 in | Wt 213.0 lb

## 2021-11-03 DIAGNOSIS — E7849 Other hyperlipidemia: Secondary | ICD-10-CM | POA: Diagnosis not present

## 2021-11-03 DIAGNOSIS — R7303 Prediabetes: Secondary | ICD-10-CM | POA: Diagnosis not present

## 2021-11-03 DIAGNOSIS — Z6839 Body mass index (BMI) 39.0-39.9, adult: Secondary | ICD-10-CM

## 2021-11-03 DIAGNOSIS — Z6841 Body Mass Index (BMI) 40.0 and over, adult: Secondary | ICD-10-CM

## 2021-11-04 NOTE — Progress Notes (Signed)
Chief Complaint:   OBESITY Melissa [name] is here to discuss her progress with her obesity treatment plan along with follow-up of her obesity related diagnoses. Coralyn is on the Category 2 Plan and states she is following her eating plan approximately 75% of the time. Sheela states she is not exercising regularly at this time.  Today's visit was #: 41 Starting weight: 256 lbs Starting date: 11/18/2019 Today's weight: 213 lbs Today's date: 11/03/2021 Total lbs lost to date: 43 lbs Total lbs lost since last in-office visit: 1 lb  Interim History: 2023 Goals  1) To walk 5k steps per day.   2) Decrease sweets/carbohydrate intake. 3) Keep sweet treats - 200 calories per day.  Subjective:   1. Pre-diabetes She can easily remember to take metformin 500 mg at breakfast, less consistent with lunchtime dose. When she takes both doses - hunger levels are decreased.  2. Other hyperlipidemia On 10/15/2021, LDL 131, total 221.  PCP increased pravastatin from 40 mg to 80 mg. She denies myalgias.  Assessment/Plan:   1. Pre-diabetes Take 2 tablets metformin 500 mg at breakfast - to help increase daily compliance.  2. Other hyperlipidemia Continue pravastatin 80 mg daily, per PCP.  3. Obesity, current BMI 39.0  Mulani is currently in the action stage of change. As such, her goal is to continue with weight loss efforts. She has agreed to the Category 2 Plan.   Exercise goals:  Increase daily walking.  Behavioral modification strategies: increasing lean protein intake, decreasing simple carbohydrates, meal planning and cooking strategies, keeping healthy foods in the home, better snacking choices, and planning for success.  Yoeli has agreed to follow-up with our clinic in 2 weeks. She was informed of the importance of frequent follow-up visits to maximize her success with intensive lifestyle modifications for her multiple health conditions.   Objective:   Blood pressure 113/71, pulse 84,  temperature 97.8 F (36.6 C), height 5\' 2"  (1.575 m), weight 213 lb (96.6 kg), SpO2 99 %. Body mass index is 38.96 kg/m.  General: Cooperative, alert, well developed, in no acute distress. HEENT: Conjunctivae and lids unremarkable. Cardiovascular: Regular rhythm.  Lungs: Normal work of breathing. Neurologic: No focal deficits.   Lab Results  Component Value Date   CREATININE 0.92 08/11/2021   BUN 20 08/11/2021   NA 140 08/11/2021   K 5.0 08/11/2021   CL 102 08/11/2021   CO2 23 08/11/2021   Lab Results  Component Value Date   ALT 13 08/11/2021   AST 13 08/11/2021   ALKPHOS 99 08/11/2021   BILITOT 0.3 08/11/2021   Lab Results  Component Value Date   HGBA1C 5.7 (H) 07/15/2021   HGBA1C 5.7 (H) 01/13/2021   HGBA1C 5.5 09/22/2020   HGBA1C 5.9 (H) 06/18/2020   Lab Results  Component Value Date   INSULIN 8.8 07/15/2021   INSULIN 15.6 01/13/2021   INSULIN 10.7 09/22/2020   INSULIN 12.9 06/18/2020   INSULIN 23.2 11/18/2019   Lab Results  Component Value Date   TSH 0.903 07/15/2021   Lab Results  Component Value Date   CHOL 195 05/06/2021   HDL 68 05/06/2021   LDLCALC 112 (H) 05/06/2021   TRIG 82 05/06/2021   CHOLHDL 2.9 05/06/2021   Lab Results  Component Value Date   VD25OH 77.1 05/06/2021   VD25OH 69.7 01/13/2021   VD25OH 76.4 09/22/2020   Lab Results  Component Value Date   WBC 8.1 06/19/2020   HGB 13.2 06/19/2020   HCT 42.4  06/19/2020   MCV 88.3 06/19/2020   PLT 374 06/19/2020   Attestation Statements:   Reviewed by clinician on day of visit: allergies, medications, problem list, medical history, surgical history, family history, social history, and previous encounter notes.  Time spent on visit including pre-visit chart review and post-visit care and charting was 27 minutes.   I, Water quality scientist, CMA, am acting as Location manager for Mina Marble, NP.  I have reviewed the above documentation for accuracy and completeness, and I agree with the above.  -  Lakedra Washington d. Nicole Hafley, NP-C

## 2021-11-15 DIAGNOSIS — M5451 Vertebrogenic low back pain: Secondary | ICD-10-CM | POA: Diagnosis not present

## 2021-11-15 DIAGNOSIS — M4310 Spondylolisthesis, site unspecified: Secondary | ICD-10-CM | POA: Diagnosis not present

## 2021-11-17 ENCOUNTER — Other Ambulatory Visit: Payer: Self-pay

## 2021-11-17 ENCOUNTER — Encounter (INDEPENDENT_AMBULATORY_CARE_PROVIDER_SITE_OTHER): Payer: Self-pay | Admitting: Adult Health

## 2021-11-17 ENCOUNTER — Ambulatory Visit (INDEPENDENT_AMBULATORY_CARE_PROVIDER_SITE_OTHER): Payer: Medicare HMO | Admitting: Adult Health

## 2021-11-17 VITALS — BP 116/79 | HR 75 | Temp 98.2°F | Ht 62.0 in | Wt 214.0 lb

## 2021-11-17 DIAGNOSIS — Z6839 Body mass index (BMI) 39.0-39.9, adult: Secondary | ICD-10-CM | POA: Diagnosis not present

## 2021-11-17 DIAGNOSIS — E039 Hypothyroidism, unspecified: Secondary | ICD-10-CM | POA: Diagnosis not present

## 2021-11-17 DIAGNOSIS — E7849 Other hyperlipidemia: Secondary | ICD-10-CM

## 2021-11-17 DIAGNOSIS — R7303 Prediabetes: Secondary | ICD-10-CM

## 2021-11-17 DIAGNOSIS — M4310 Spondylolisthesis, site unspecified: Secondary | ICD-10-CM | POA: Diagnosis not present

## 2021-11-17 DIAGNOSIS — E669 Obesity, unspecified: Secondary | ICD-10-CM

## 2021-11-17 DIAGNOSIS — Z6841 Body Mass Index (BMI) 40.0 and over, adult: Secondary | ICD-10-CM

## 2021-11-17 MED ORDER — METFORMIN HCL 500 MG PO TABS: ORAL_TABLET | ORAL | 0 refills | Status: DC

## 2021-11-17 NOTE — Progress Notes (Signed)
Chief Complaint:   OBESITY Adie is here to discuss her progress with her obesity treatment plan along with follow-up of her obesity related diagnoses. Laiken is on the Category 2 Plan and states she is following her eating plan approximately 85% of the time. Yolunda states she is walking for 20-30 minutes 3-4 times per week.  Today's visit was #: 70 Starting weight: 256 lbs Starting date: 11/18/2019 Today's weight: 214 lbs Today's date: 11/17/2021 Total lbs lost to date: 42 lbs Total lbs lost since last in-office visit: 0  Interim History:  Temple recently signed up for a gym membership at [employer].   New client intake on 11/22/2021.  Dr. Rolena Infante ordered MRI of lumbar back for spondylolisthesis.  Subjective:   1. Pre-diabetes Last 2 A1c checks - 5.7 with elevated insulin level. On 08/11/2021, CMP - GFR 73. To improve compliance, recommended to take both metformin 500 mg tablets with breakfast instead BID dosing. She has been more successful taking both tabs at breakfast consistently.  2. Spondylolisthesis, unspecified spinal region Dr. Rolena Infante ordered MRI of lumbar back for spondylolisthesis. Per pt- surgical intervention is likely.  3. Other hyperlipidemia PCP ran labs on 10/15/2021 - Lipid Panel: total 221, HDL 78, LDL 131. PCP increased pravastatin 40 mg to 80 mg with recent lipid panel. She denies myaglia's.  4. Hypothyroidism, unspecified type PCP ran thyroid panel on 10/15/2021 - TSH 0.242. PCP decreased levothyroxine from 150 mcg to 125 mcg.  Assessment/Plan:   1. Pre-diabetes Refill metformin 500 mg 2 tablets at breakfast. Check insulin.  - Refill metFORMIN (GLUCOPHAGE) 500 MG tablet; 2 tabs at breakfast  Dispense: 60 tablet; Refill: 0  2. Spondylolisthesis, unspecified spinal region Complete ordered MRI.  3. Other hyperlipidemia Continue daily pravastatin 80 mg.  4. Hypothyroidism, unspecified type Continue levothyroxine 125 mcg.  Check TSH at next  office visit. Send results to PCP - Vicenta Aly, NP/[facility].  5. Obesity, current BMI 39.1  Lavren is currently in the action stage of change. As such, her goal is to continue with weight loss efforts. She has agreed to the Category 2 Plan.   Check fasting labs at next office visit - insulin, TSH, vitamin D level.  Exercise goals:  Exercise plan per [name] program.  Behavioral modification strategies: increasing lean protein intake, decreasing simple carbohydrates, meal planning and cooking strategies, keeping healthy foods in the home, and planning for success.  Sheniya has agreed to follow-up with our clinic in 2 weeks. She was informed of the importance of frequent follow-up visits to maximize her success with intensive lifestyle modifications for her multiple health conditions.   Objective:   Blood pressure 116/79, pulse 75, temperature 98.2 F (36.8 C), height 5\' 2"  (1.575 m), weight 214 lb (97.1 kg), SpO2 99 %. Body mass index is 39.14 kg/m.  General: Cooperative, alert, well developed, in no acute distress. HEENT: Conjunctivae and lids unremarkable. Cardiovascular: Regular rhythm.  Lungs: Normal work of breathing. Neurologic: No focal deficits.   Lab Results  Component Value Date   CREATININE 0.92 08/11/2021   BUN 20 08/11/2021   NA 140 08/11/2021   K 5.0 08/11/2021   CL 102 08/11/2021   CO2 23 08/11/2021   Lab Results  Component Value Date   ALT 13 08/11/2021   AST 13 08/11/2021   ALKPHOS 99 08/11/2021   BILITOT 0.3 08/11/2021   Lab Results  Component Value Date   HGBA1C 5.7 (H) 07/15/2021   HGBA1C 5.7 (H) 01/13/2021   HGBA1C  5.5 09/22/2020   HGBA1C 5.9 (H) 06/18/2020   Lab Results  Component Value Date   INSULIN 8.8 07/15/2021   INSULIN 15.6 01/13/2021   INSULIN 10.7 09/22/2020   INSULIN 12.9 06/18/2020   INSULIN 23.2 11/18/2019   Lab Results  Component Value Date   TSH 0.903 07/15/2021   Lab Results  Component Value Date   CHOL 195  05/06/2021   HDL 68 05/06/2021   LDLCALC 112 (H) 05/06/2021   TRIG 82 05/06/2021   CHOLHDL 2.9 05/06/2021   Lab Results  Component Value Date   VD25OH 77.1 05/06/2021   VD25OH 69.7 01/13/2021   VD25OH 76.4 09/22/2020   Lab Results  Component Value Date   WBC 8.1 06/19/2020   HGB 13.2 06/19/2020   HCT 42.4 06/19/2020   MCV 88.3 06/19/2020   PLT 374 06/19/2020   Obesity Behavioral Intervention:   Approximately 15 minutes were spent on the discussion below.  ASK: We discussed the diagnosis of obesity with [name] today and Eyvonne agreed to give [name] permission to discuss obesity behavioral modification therapy today.  ASSESS: Lerline has the diagnosis of obesity and her BMI today is 39.1. Denica is in the action stage of change.   ADVISE: Sheyly was educated on the multiple health risks of obesity as well as the benefit of weight loss to improve her health. She was advised of the need for long term treatment and the importance of lifestyle modifications to improve her current health and to decrease her risk of future health problems.  AGREE: Multiple dietary modification options and treatment options were discussed and Lachana agreed to follow the recommendations documented in the above note.  ARRANGE: Danielle was educated on the importance of frequent visits to treat obesity as outlined per CMS and USPSTF guidelines and agreed to schedule her next follow up appointment today.  Attestation Statements:   Reviewed by clinician on day of visit: allergies, medications, problem list, medical history, surgical history, family history, social history, and previous encounter notes.  I, Water quality scientist, CMA, am acting as Location manager for Mina Marble, NP.  I have reviewed the above documentation for accuracy and completeness, and I agree with the above. -  Olof Marcil d. Tesla Keeler, NP-C

## 2021-11-19 DIAGNOSIS — Z30432 Encounter for removal of intrauterine contraceptive device: Secondary | ICD-10-CM | POA: Diagnosis not present

## 2021-11-24 DIAGNOSIS — M5451 Vertebrogenic low back pain: Secondary | ICD-10-CM | POA: Diagnosis not present

## 2021-11-25 DIAGNOSIS — R69 Illness, unspecified: Secondary | ICD-10-CM | POA: Diagnosis not present

## 2021-11-25 DIAGNOSIS — K5900 Constipation, unspecified: Secondary | ICD-10-CM | POA: Diagnosis not present

## 2021-11-25 DIAGNOSIS — E8881 Metabolic syndrome: Secondary | ICD-10-CM | POA: Diagnosis not present

## 2021-11-25 DIAGNOSIS — J309 Allergic rhinitis, unspecified: Secondary | ICD-10-CM | POA: Diagnosis not present

## 2021-11-25 DIAGNOSIS — M1990 Unspecified osteoarthritis, unspecified site: Secondary | ICD-10-CM | POA: Diagnosis not present

## 2021-11-25 DIAGNOSIS — I951 Orthostatic hypotension: Secondary | ICD-10-CM | POA: Diagnosis not present

## 2021-11-25 DIAGNOSIS — R030 Elevated blood-pressure reading, without diagnosis of hypertension: Secondary | ICD-10-CM | POA: Diagnosis not present

## 2021-11-25 DIAGNOSIS — F324 Major depressive disorder, single episode, in partial remission: Secondary | ICD-10-CM | POA: Diagnosis not present

## 2021-11-25 DIAGNOSIS — K219 Gastro-esophageal reflux disease without esophagitis: Secondary | ICD-10-CM | POA: Diagnosis not present

## 2021-11-25 DIAGNOSIS — E039 Hypothyroidism, unspecified: Secondary | ICD-10-CM | POA: Diagnosis not present

## 2021-11-25 DIAGNOSIS — G8929 Other chronic pain: Secondary | ICD-10-CM | POA: Diagnosis not present

## 2021-11-25 DIAGNOSIS — E785 Hyperlipidemia, unspecified: Secondary | ICD-10-CM | POA: Diagnosis not present

## 2021-11-29 DIAGNOSIS — M5451 Vertebrogenic low back pain: Secondary | ICD-10-CM | POA: Diagnosis not present

## 2021-12-01 ENCOUNTER — Encounter (INDEPENDENT_AMBULATORY_CARE_PROVIDER_SITE_OTHER): Payer: Self-pay | Admitting: Adult Health

## 2021-12-01 ENCOUNTER — Ambulatory Visit (INDEPENDENT_AMBULATORY_CARE_PROVIDER_SITE_OTHER): Payer: Medicare HMO | Admitting: Adult Health

## 2021-12-01 ENCOUNTER — Other Ambulatory Visit: Payer: Self-pay

## 2021-12-01 VITALS — BP 109/70 | HR 73 | Temp 98.0°F | Ht 62.0 in | Wt 211.0 lb

## 2021-12-01 DIAGNOSIS — E875 Hyperkalemia: Secondary | ICD-10-CM | POA: Diagnosis not present

## 2021-12-01 DIAGNOSIS — E559 Vitamin D deficiency, unspecified: Secondary | ICD-10-CM

## 2021-12-01 DIAGNOSIS — R7303 Prediabetes: Secondary | ICD-10-CM

## 2021-12-01 DIAGNOSIS — Z6838 Body mass index (BMI) 38.0-38.9, adult: Secondary | ICD-10-CM

## 2021-12-01 DIAGNOSIS — E039 Hypothyroidism, unspecified: Secondary | ICD-10-CM | POA: Diagnosis not present

## 2021-12-01 DIAGNOSIS — E669 Obesity, unspecified: Secondary | ICD-10-CM

## 2021-12-01 NOTE — Progress Notes (Signed)
Chief Complaint:   OBESITY Melissa [name] is here to discuss her progress with her obesity treatment plan along with follow-up of her obesity related diagnoses. Melissa [name] is on the Category 2 Plan and states she is following her eating plan approximately 85-90% of the time. Melissa [name] states she is walking/going to the gym for 20-40 minutes 3-4 times per week.  Today's visit was #: 4 Starting weight: 256 lbs Starting date: 11/18/2019 Today's weight: 211 lbs Today's date: 12/01/2021 Total lbs lost to date: 45 lbs Total lbs lost since last in-office visit: 3 lbs  Interim History:  Melissa [name] says that life has been more stable and predictable lately. Recent increase physical activity- Melissa [name] gym visits on Monday/Wednesday/Friday. Increased daily walking.  Subjective:   1. Pre-diabetes She is on metformin 500 mg - 2 tablets at breakfast. She reports a decrease in appetite and carbohydrate/sugar cravings. She denies GI upset with metformin use.  2. Hyperkalemia She denies cardiac symptoms at present.  3. Vitamin D deficiency She is on OTC vitamin D3 1000 - 3 tablets daily.  4. Hypothyroidism, unspecified type She is on levothyroxine 125 mcg daily.  Assessment/Plan:   1. Pre-diabetes Check labs.  - Hemoglobin A1c - Insulin, random - Vitamin B12  2. Hyperkalemia Check labs today.  - Comprehensive metabolic panel  3. Vitamin D deficiency Check vitamin D level today.  - VITAMIN D 25 Hydroxy (Vit-D Deficiency, Fractures)  4. Hypothyroidism, unspecified type Check labs.  - TSH+T4F+T3Free  5. Obesity, current BMI 38.7  Melissa [name] is currently in the action stage of change. As such, her goal is to continue with weight loss efforts. She has agreed to the Category 2 Plan.   MRI completed - follow-up with Dr. Dorma [name] - ordered PT and 1 more steroid injection.  On December 22, 2021, PT will begin.  Exercise goals:  As is.  Behavioral modification strategies: increasing lean  protein intake, decreasing simple carbohydrates, meal planning and cooking strategies, keeping healthy foods in the home, and planning for success.  Melissa [name] has agreed to follow-up with our clinic in 2 weeks. She was informed of the importance of frequent follow-up visits to maximize her success with intensive lifestyle modifications for her multiple health conditions.   Melissa [name] was informed we would discuss her lab results at her next visit unless there is a critical issue that needs to be addressed sooner. Melissa [name] agreed to keep her next visit at the agreed upon time to discuss these results.  Objective:   Blood pressure 109/70, pulse 73, temperature 98 F (36.7 C), height 5\' 2"  (1.575 m), weight 211 lb (95.7 kg), SpO2 99 %. Body mass index is 38.59 kg/m.  General: Cooperative, alert, well developed, in no acute distress. HEENT: Conjunctivae and lids unremarkable. Cardiovascular: Regular rhythm.  Lungs: Normal work of breathing. Neurologic: No focal deficits.   Lab Results  Component Value Date   CREATININE 0.92 08/11/2021   BUN 20 08/11/2021   NA 140 08/11/2021   K 5.0 08/11/2021   CL 102 08/11/2021   CO2 23 08/11/2021   Lab Results  Component Value Date   ALT 13 08/11/2021   AST 13 08/11/2021   ALKPHOS 99 08/11/2021   BILITOT 0.3 08/11/2021   Lab Results  Component Value Date   HGBA1C 5.7 (H) 07/15/2021   HGBA1C 5.7 (H) 01/13/2021   HGBA1C 5.5 09/22/2020   HGBA1C 5.9 (H) 06/18/2020   Lab Results  Component Value Date   INSULIN 8.8 07/15/2021   INSULIN 15.6  01/13/2021   INSULIN 10.7 09/22/2020   INSULIN 12.9 06/18/2020   INSULIN 23.2 11/18/2019   Lab Results  Component Value Date   TSH 0.903 07/15/2021   Lab Results  Component Value Date   CHOL 195 05/06/2021   HDL 68 05/06/2021   LDLCALC 112 (H) 05/06/2021   TRIG 82 05/06/2021   CHOLHDL 2.9 05/06/2021   Lab Results  Component Value Date   VD25OH 77.1 05/06/2021   VD25OH 69.7 01/13/2021   VD25OH 76.4  09/22/2020   Lab Results  Component Value Date   WBC 8.1 06/19/2020   HGB 13.2 06/19/2020   HCT 42.4 06/19/2020   MCV 88.3 06/19/2020   PLT 374 06/19/2020   Obesity Behavioral Intervention:   Approximately 15 minutes were spent on the discussion below.  ASK: We discussed the diagnosis of obesity with Melissa [name] today and Melissa [name] agreed to give [name] permission to discuss obesity behavioral modification therapy today.  ASSESS: Melissa [name] has the diagnosis of obesity and her BMI today is 38.7. Melissa [name] is in the action stage of change.   ADVISE: Melissa [name] was educated on the multiple health risks of obesity as well as the benefit of weight loss to improve her health. She was advised of the need for long term treatment and the importance of lifestyle modifications to improve her current health and to decrease her risk of future health problems.  AGREE: Multiple dietary modification options and treatment options were discussed and Melissa [name] agreed to follow the recommendations documented in the above note.  ARRANGE: Melissa [name] was educated on the importance of frequent visits to treat obesity as outlined per CMS and USPSTF guidelines and agreed to schedule her next follow up appointment today.  Attestation Statements:   Reviewed by clinician on day of visit: allergies, medications, problem list, medical history, surgical history, family history, social history, and previous encounter notes.  I, Melissa [name], CMA, am acting as Location manager for Mina Marble, NP.  I have reviewed the above documentation for accuracy and completeness, and I agree with the above. -  Kerrilyn Azbill d. Matisse Roskelley, NP-C

## 2021-12-02 ENCOUNTER — Other Ambulatory Visit (INDEPENDENT_AMBULATORY_CARE_PROVIDER_SITE_OTHER): Payer: Self-pay | Admitting: Adult Health

## 2021-12-02 ENCOUNTER — Encounter (INDEPENDENT_AMBULATORY_CARE_PROVIDER_SITE_OTHER): Payer: Self-pay | Admitting: Adult Health

## 2021-12-02 LAB — COMPREHENSIVE METABOLIC PANEL
ALT: 21 IU/L (ref 0–32)
AST: 26 IU/L (ref 0–40)
Albumin/Globulin Ratio: 2 (ref 1.2–2.2)
Albumin: 4.9 g/dL (ref 3.8–4.9)
Alkaline Phosphatase: 93 IU/L (ref 44–121)
BUN/Creatinine Ratio: 21 (ref 9–23)
BUN: 20 mg/dL (ref 6–24)
Bilirubin Total: 0.3 mg/dL (ref 0.0–1.2)
CO2: 20 mmol/L (ref 20–29)
Calcium: 9.9 mg/dL (ref 8.7–10.2)
Chloride: 101 mmol/L (ref 96–106)
Creatinine, Ser: 0.96 mg/dL (ref 0.57–1.00)
Globulin, Total: 2.4 g/dL (ref 1.5–4.5)
Glucose: 104 mg/dL — ABNORMAL HIGH (ref 70–99)
Potassium: 5 mmol/L (ref 3.5–5.2)
Sodium: 141 mmol/L (ref 134–144)
Total Protein: 7.3 g/dL (ref 6.0–8.5)
eGFR: 69 mL/min/{1.73_m2} (ref >59)

## 2021-12-02 LAB — VITAMIN D 25 HYDROXY (VIT D DEFICIENCY, FRACTURES): Vit D, 25-Hydroxy: 102 ng/mL — ABNORMAL HIGH (ref 30.0–100.0)

## 2021-12-02 LAB — INSULIN, RANDOM: INSULIN: 12.5 u[IU]/mL (ref 2.6–24.9)

## 2021-12-02 LAB — TSH+T4F+T3FREE
Free T4: 1.55 ng/dL (ref 0.82–1.77)
T3, Free: 2.4 pg/mL (ref 2.0–4.4)
TSH: 1.58 u[IU]/mL (ref 0.450–4.500)

## 2021-12-02 LAB — HEMOGLOBIN A1C
Est. average glucose Bld gHb Est-mCnc: 114 mg/dL
Hgb A1c MFr Bld: 5.6 % (ref 4.8–5.6)

## 2021-12-02 LAB — VITAMIN B12: Vitamin B-12: 810 pg/mL (ref 232–1245)

## 2021-12-16 ENCOUNTER — Other Ambulatory Visit: Payer: Self-pay

## 2021-12-16 ENCOUNTER — Ambulatory Visit (INDEPENDENT_AMBULATORY_CARE_PROVIDER_SITE_OTHER): Payer: Medicare HMO | Admitting: Adult Health

## 2021-12-16 ENCOUNTER — Encounter (INDEPENDENT_AMBULATORY_CARE_PROVIDER_SITE_OTHER): Payer: Self-pay | Admitting: Adult Health

## 2021-12-16 VITALS — BP 101/64 | HR 72 | Temp 98.0°F | Ht 62.0 in | Wt 212.0 lb

## 2021-12-16 DIAGNOSIS — Z6838 Body mass index (BMI) 38.0-38.9, adult: Secondary | ICD-10-CM | POA: Diagnosis not present

## 2021-12-16 DIAGNOSIS — E559 Vitamin D deficiency, unspecified: Secondary | ICD-10-CM

## 2021-12-16 DIAGNOSIS — E669 Obesity, unspecified: Secondary | ICD-10-CM

## 2021-12-16 DIAGNOSIS — R7303 Prediabetes: Secondary | ICD-10-CM | POA: Diagnosis not present

## 2021-12-16 DIAGNOSIS — Z6841 Body Mass Index (BMI) 40.0 and over, adult: Secondary | ICD-10-CM

## 2021-12-16 DIAGNOSIS — E875 Hyperkalemia: Secondary | ICD-10-CM

## 2021-12-16 MED ORDER — METFORMIN HCL 500 MG PO TABS: ORAL_TABLET | ORAL | 0 refills | Status: DC

## 2021-12-16 NOTE — Progress Notes (Signed)
Chief Complaint:   OBESITY Melissa [name] is here to discuss her progress with her obesity treatment plan along with follow-up of her obesity related diagnoses. Melissa [name] is on the Category 2 Plan and states she is following her eating plan approximately 85% of the time. Melissa [name] states she is going to the gym for 45-60 minutes 3-4 times per week.  Today's visit was #: 59 Starting weight: 256 lbs Starting date: 11/18/2019 Today's weight: 212 lbs Today's date: 12/16/2021 Total lbs lost to date: 44 lbs Total lbs lost since last in-office visit: 0  Interim History:  Melissa [name] is enjoying her regular exercise regime at [place]. She is using machines and cardio equipment. She has yet to join a group fitness class.  Starts PT next week for chronic back pain - [name].  Subjective:   1. Vitamin D deficiency Discussed labs with patient today.  On 12/01/2021, vitamin D level - 102 - slightly over-replaced. She was on OTC vitamin D3 1000 IU - 3 capsules per day. Instructed to stop on 12/02/21- has remained off OTC supplement since then. She denies N/V/muscle weakness.  2. Pre-diabetes Discussed labs with patient today.  On 12/01/2021, BG 109, A1c 5.6, insulin 12.5. She is on metformin 500 mg - 2 tablets at breakfast. Tolerating well, and endorses decreased appetite with consistent use biguanide use.  3. Hyperkalemia Discussed labs with patient today.  On 12/01/2021, CMP - potassium 5.0 - normal. She denies acute cardiac symptoms.  Assessment/Plan:   1. Vitamin D deficiency Remain off OTC vitamin D supplement.  2. Pre-diabetes Refill metformin 500 mg - 2 tablets at breakfast.  - Refill metFORMIN (GLUCOPHAGE) 500 MG tablet; 2 tabs at breakfast  Dispense: 60 tablet; Refill: 0  3. Hyperkalemia Monitor labs.  4. Obesity, current BMI 38.8  Melissa [name] is currently in the action stage of change. As such, her goal is to continue with weight loss efforts.  She has agreed to the Category 2 Plan.   Exercise goals:  As is.  Behavioral modification strategies: increasing lean protein intake, decreasing simple carbohydrates, meal planning and cooking strategies, keeping healthy foods in the home, and planning for success.  Melissa [name] has agreed to follow-up with our clinic in 2 weeks. She was informed of the importance of frequent follow-up visits to maximize her success with intensive lifestyle modifications for her multiple health conditions.   Objective:   Blood pressure 101/64, pulse 72, temperature 98 F (36.7 C), height 5\' 2"  (1.575 m), weight 212 lb (96.2 kg), SpO2 99 %. Body mass index is 38.78 kg/m.  General: Cooperative, alert, well developed, in no acute distress. HEENT: Conjunctivae and lids unremarkable. Cardiovascular: Regular rhythm.  Lungs: Normal work of breathing. Neurologic: No focal deficits.   Lab Results  Component Value Date   CREATININE 0.96 12/01/2021   BUN 20 12/01/2021   NA 141 12/01/2021   K 5.0 12/01/2021   CL 101 12/01/2021   CO2 20 12/01/2021   Lab Results  Component Value Date   ALT 21 12/01/2021   AST 26 12/01/2021   ALKPHOS 93 12/01/2021   BILITOT 0.3 12/01/2021   Lab Results  Component Value Date   HGBA1C 5.6 12/01/2021   HGBA1C 5.7 (H) 07/15/2021   HGBA1C 5.7 (H) 01/13/2021   HGBA1C 5.5 09/22/2020   HGBA1C 5.9 (H) 06/18/2020   Lab Results  Component Value Date   INSULIN 12.5 12/01/2021   INSULIN 8.8 07/15/2021   INSULIN 15.6 01/13/2021   INSULIN 10.7  09/22/2020   INSULIN 12.9 06/18/2020   Lab Results  Component Value Date   TSH 1.580 12/01/2021   Lab Results  Component Value Date   CHOL 195 05/06/2021   HDL 68 05/06/2021   LDLCALC 112 (H) 05/06/2021   TRIG 82 05/06/2021   CHOLHDL 2.9 05/06/2021   Lab Results  Component Value Date   VD25OH 102.0 (H) 12/01/2021   VD25OH 77.1 05/06/2021   VD25OH 69.7 01/13/2021   Lab Results  Component Value Date   WBC 8.1 06/19/2020    HGB 13.2 06/19/2020   HCT 42.4 06/19/2020   MCV 88.3 06/19/2020   PLT 374 06/19/2020   Obesity Behavioral Intervention:   Approximately 15 minutes were spent on the discussion below.  ASK: We discussed the diagnosis of obesity with [name] today and [name] agreed to give [name] permission to discuss obesity behavioral modification therapy today.  ASSESS: [name] has the diagnosis of obesity and her BMI today is 38.8. Melissa [name] is in the action stage of change.   ADVISE: Melissa [name] was educated on the multiple health risks of obesity as well as the benefit of weight loss to improve her health. She was advised of the need for long term treatment and the importance of lifestyle modifications to improve her current health and to decrease her risk of future health problems.  AGREE: Multiple dietary modification options and treatment options were discussed and Melissa [name] agreed to follow the recommendations documented in the above note.  ARRANGE: Melissa [name] was educated on the importance of frequent visits to treat obesity as outlined per CMS and USPSTF guidelines and agreed to schedule her next follow up appointment today.  Attestation Statements:   Reviewed by clinician on day of visit: allergies, medications, problem list, medical history, surgical history, family history, social history, and previous encounter notes.  I, Water quality scientist, CMA, am acting as Location manager for Mina Marble, NP.  I have reviewed the above documentation for accuracy and completeness, and I agree with the above. -  Alfie Rideaux d. Raeann Offner, NP-C

## 2021-12-20 DIAGNOSIS — G894 Chronic pain syndrome: Secondary | ICD-10-CM | POA: Diagnosis not present

## 2021-12-22 ENCOUNTER — Ambulatory Visit (HOSPITAL_BASED_OUTPATIENT_CLINIC_OR_DEPARTMENT_OTHER): Payer: Medicare HMO | Attending: Orthopedic Surgery | Admitting: Physical Therapy

## 2021-12-22 ENCOUNTER — Other Ambulatory Visit: Payer: Self-pay

## 2021-12-22 ENCOUNTER — Encounter (HOSPITAL_BASED_OUTPATIENT_CLINIC_OR_DEPARTMENT_OTHER): Payer: Self-pay | Admitting: Physical Therapy

## 2021-12-22 DIAGNOSIS — R262 Difficulty in walking, not elsewhere classified: Secondary | ICD-10-CM | POA: Insufficient documentation

## 2021-12-22 DIAGNOSIS — M545 Low back pain, unspecified: Secondary | ICD-10-CM | POA: Diagnosis not present

## 2021-12-22 DIAGNOSIS — M6281 Muscle weakness (generalized): Secondary | ICD-10-CM | POA: Insufficient documentation

## 2021-12-22 NOTE — Therapy (Signed)
OUTPATIENT PHYSICAL THERAPY THORACOLUMBAR EVALUATION   Patient Name: Melissa [name] MRN: [id number] DOB:[birth year], 56 y.o., female Today's Date: 12/22/2021   PT End of Session - 12/22/21 1235     Visit Number 1    Number of Visits 21    Date for PT Re-Evaluation 03/22/22    Authorization Type [employer] MCR    PT Start Time 1145    PT Stop Time 1230    PT Time Calculation (min) 45 min    Activity Tolerance Patient tolerated treatment well    Behavior During Therapy WFL for tasks assessed/performed             Past Medical History:  Diagnosis Date   Allergic rhinitis    Anxiety    Arthritis    Back pain    Constipated    Depression    Diaphragmatic hernia    Fatty liver    GERD (gastroesophageal reflux disease)    Hypertension    Hypothyroidism    IBS (irritable bowel syndrome)    Joint pain    Kidney problem    Lactose intolerance    Morbid obesity (HCC)    OSA (obstructive sleep apnea)    Osteoarthritis    Seasonal allergies    Tinnitus    Vitamin D deficiency    Past Surgical History:  Procedure Laterality Date   CARPAL TUNNEL RELEASE     rt/lt   FINGER ARTHRODESIS  11/08/2011   Procedure: ARTHRODESIS FINGER;  Surgeon: Wyn Forster., MD;  Location: [facility];  Service: Orthopedics;  Laterality: Left;  left thumb carpal-metacarpal fusion   KNEE ARTHROSCOPY     both knees   PARTIAL KNEE ARTHROPLASTY  11/05/2012   Procedure: UNICOMPARTMENTAL KNEE;  Surgeon: [name], MD;  Location: [facility];  Service: Orthopedics;  Laterality: Right;   TONSILLECTOMY     TOTAL SHOULDER ARTHROPLASTY Right 06/25/2020   Procedure: TOTAL SHOULDER ARTHROPLASTY;  Surgeon: Yolonda Kida, MD;  Location: [facility];  Service: Orthopedics;  Laterality: Right;  2.5 hrs   URETER SURGERY     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-little lt kidney function, has one functioning kidney   WISDOM TOOTH EXTRACTION     Patient Active Problem List   Diagnosis Date  Noted   Hyperkalemia 08/11/2021   Major depressive disorder, recurrent episode, mild with anxious distress (HCC) 06/21/2021   Lumbar radiculopathy 06/01/2021   Other hyperlipidemia 06/01/2021   Hypothyroidism 11/30/2020   Prediabetes 11/30/2020   Constipated    DDD (degenerative disc disease), cervical 08/09/2018   Neck pain 07/21/2018   Chronic back pain 04/03/2018   Spondylolisthesis 04/03/2018   Osteoarthritis of right knee 02/23/2018   Osteoarthritis of left knee 02/05/2018   Acquired hypothyroidism 12/13/2016   History of systemic reaction to bee sting 12/13/2016   Atrophy of left kidney 09/10/2015   Hydronephrosis of right kidney 09/10/2015   Obstructive sleep apnea syndrome, mild 08/05/2014   Expected blood loss anemia 11/06/2012   Obese 11/06/2012   S/P right UKR 11/05/2012   Major depression, recurrent, chronic (HCC) 02/08/2011   Vitamin D deficiency 04/30/2010   Diaphragmatic hernia 09/11/2007    PCP: Elizabeth Palau, FNP  REFERRING PROVIDER: Venita Lick, MD  REFERRING DIAG: M54.51 (ICD-10-CM) - Vertebrogenic low back pain   THERAPY DIAG:  Pain, lumbar region - Plan: PT plan of care cert/re-cert  Muscle weakness (generalized) - Plan: PT plan of care cert/re-cert  Difficulty walking - Plan: PT plan of care cert/re-cert  ONSET  DATE: 01/2018  SUBJECTIVE:                                                                                                                                                                                           SUBJECTIVE STATEMENT: Pt states that in 2019, she was trying to start a generator and the back went out. Pt has tried PT and injections in the past. Her last injection was June 2022 and it did not make a difference. She has trouble sleeping at night. Pt states images were taken. She has bulging disks as well as "slipping discs." She has L2-3 cysts a well. She states she also stenosis. She has NT coming down both legs- depending  on the day. L is worse than R. Pt states that PT the past has not helped. She states that losing 50lbs recently has helped. Pt is a member here at [employer]- working on legs and core. 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 Workouts do no bother back while doing but it hurts after. Pt states that her R knee is also very painful and is likely time for full knee replacement.   Pt feels like she will have to have back surgery at some point. She is deciding between knee or back.   PERTINENT HISTORY:  Chronic pain, OA, R partial knee and R shoulder replacements, DDD, depression, vertigo (last 6 months)  PAIN:  Are you having pain? Yes NPRS scale: 6/10 Pain location: Center of the back near tailbone Pain orientation: Bilateral  PAIN TYPE: constant pressure, "drilling"  Aggravating factors: walking, bending, lifting, sitting too long, sleeping on back Relieving factors: Tylenol, hydrocodone  PRECAUTIONS: None  WEIGHT BEARING RESTRICTIONS No  FALLS:  Has patient fallen in last 6 months? No, Number of falls: 0  LIVING ENVIRONMENT: Lives with: lives alone Lives in: House/apartment Stairs: Yes; 5 steps to enter Has following equipment at home: Single point cane and walking stick, does not use since weight loss ; walking on uneven   OCCUPATION: disability; helps with farm animal rescue  PLOF: Independent with basic ADLs  PATIENT GOALS : Prolonging having to have back surgery   OBJECTIVE:   DIAGNOSTIC FINDINGS:  N/A in Epic  PATIENT SURVEYS:  FOTO unavailable  SCREENING FOR RED FLAGS: Bowel or bladder incontinence: No Spinal tumors: No Cauda equina syndrome: No Compression fracture: No   COGNITION:  Overall cognitive status: Within functional limits for tasks assessed     SENSATION:  Light touch: Deficits L3 NT with touch    POSTURE:  Exaggerated lumbar lordosis in standing  PALPATION: Signifcant TTP and hypertonicity of bilateral L3-5 paraspinals  Joint  mobility: lower  lumbar facet joints stiff and resting in extension position  LUMBARAROM/PROM  A/PROM A/PROM  12/22/2021  Flexion WFL  Extension 75% p!  Right lateral flexion WFL  Left lateral flexion WFL p!  Right rotation WFL  Left rotation WFL p!   (Blank rows = not tested)  LE AROM: moderately limited in all planes at the hip on L; R WFL; R knee significantly limited with knee flexion; ext [facility]  LE MMT:  MMT Right 12/22/2021 Left 12/22/2021  Hip flexion 4+/5 4/5  Hip extension    Hip abduction 4+/5 4/5  Hip adduction 4+/5 4+/5  Knee flexion 4+/5 4/5  Knee extension 4+/5 4/5   (Blank rows = not tested)  LUMBAR SPECIAL TESTS:  Straight leg raise test: Positive, Slump test: Positive, and FABER test: Positive  FUNCTIONAL TESTS:  5 times sit to stand: 17.5s  GAIT: Distance walked: 62ft Assistive device utilized: None Level of assistance: Complete Independence Comments: antalgic, compensated Trendelenburg  Stairs: R UE support ascend reciprocal; descend R UE support step to with R leading due to knee pain; bilateral trunk sway with ascend and descent    TODAY'S TREATMENT   Exercises Supine Posterior Pelvic Tilt - 2 x daily - 7 x weekly - 2 sets - 10 reps - 2 hold Neutral Lumbar Spine Curl Up - 2 x daily - 7 x weekly - 2 sets - 10 reps - 2 hold Seated Lumbar Flexion Stretch - 2 x daily - 7 x weekly - 1 sets - 10 reps - 2 hold    PATIENT EDUCATION:  Education details: MOI, diagnosis, prognosis, anatomy, exercise progression, DOMS expectations, muscle firing,  envelope of function, HEP, POC  Person educated: Patient Education method: Explanation, Demonstration, Tactile cues, Verbal cues, and Handouts Education comprehension: verbalized understanding, returned demonstration, verbal cues required, and tactile cues required   HOME EXERCISE PROGRAM: Access Code: [email or web address] URL: [email or web address] Date: 12/22/2021 Prepared by: Zebedee Iba  ASSESSMENT:  CLINICAL  IMPRESSION: Patient is a 56 y.o. female who was seen today for physical therapy evaluation and treatment for cc of LBP and difficulty walking. Pt's s/s appear consistent with a lumbar radiculopathy and history of stenosis affecting L>R. Pt does appear to have lumbar flexion preference. Pt with report of mild nausea during session that may be related to history of vertigo-like symptoms. Pt also has concurrent R knee OA/pain that also limits her functional mobility. Pt with significant history of chronic pain and movement deficits.   OBJECTIVE IMPAIRMENTS Abnormal gait, decreased activity tolerance, decreased balance, decreased endurance, decreased mobility, difficulty walking, decreased ROM, decreased strength, hypomobility, increased muscle spasms, impaired flexibility, impaired UE functional use, improper body mechanics, postural dysfunction, obesity, and pain.   ACTIVITY LIMITATIONS cleaning, community activity, occupation, yard work, shopping, and exercise .   PERSONAL FACTORS Age, Behavior pattern, Fitness, Past/current experiences, Time since onset of injury/illness/exacerbation, and 1-2 comorbidities:    are also affecting patient's functional outcome.    REHAB POTENTIAL: Fair    CLINICAL DECISION MAKING: Evolving/moderate complexity  EVALUATION COMPLEXITY: Low   GOALS:   SHORT TERM GOALS:  STG Name Target Date Goal status  1 Pt will become independent with HEP in order to demonstrate synthesis of PT education.  Baseline:  02/02/2022 INITIAL  2 Pt will report at least 2 pt reduction on NPRS scale for pain in order to demonstrate functional improvement with household activity, self care, and ADL.  Baseline:  02/02/2022 INITIAL  3 Pt will be able to demonstrate/report  ability to sit/stand/sleep for extended periods of time without pain in order to demonstrate functional improvement and tolerance to static positioning.  Baseline: 02/02/2022 INITIAL   LONG TERM GOALS:   LTG Name  Target Date Goal status  1 Pt  will become independent with final HEP in order to demonstrate synthesis of PT education.  Baseline: 03/16/2022 INITIAL  2 Pt will be able to perform 5XSTS in under 12s  in order to demonstrate functional improvement above the cut off score for adults.  Baseline: 03/16/2022 INITIAL  3 Pt will be able to demonstrate/report ability to walk >30 mins without pain in order to demonstrate functional improvement and tolerance to exercise and community mobility.  Baseline: 03/16/2022 INITIAL  4 Pt will be able to lift/squat/hold >25 lbs in order to demonstrate functional improvement in lumbopelvic strength for full ADL and exercise.  Baseline: 03/16/2022 INITIAL   PLAN: PT FREQUENCY: 1-2x/week  PT DURATION: 12 weeks (likely D/C by 8 weeks)  PLANNED INTERVENTIONS: Therapeutic exercises, Therapeutic activity, Neuromuscular re-education, Balance training, Gait training, Patient/Family education, Joint manipulation, Joint mobilization, Stair training, DME instructions, Aquatic Therapy, Dry Needling, Electrical stimulation, Spinal manipulation, Spinal mobilization, Cryotherapy, Moist heat, Taping, Vasopneumatic device, Traction, Ultrasound, Ionotophoresis 4mg /ml Dexamethasone, and Manual therapy  PLAN FOR NEXT SESSION: intro aquatics- lumbar flexion preference, hip stretching, general movement   Zebedee IbaAlan Lolamae Voisin, PT 12/22/2021, 12:53 PM

## 2021-12-28 NOTE — Therapy (Signed)
OUTPATIENT PHYSICAL THERAPY TREATMENT NOTE   Patient Name: Melissa [name] MRN: [id number] DOB:09-08-1966, 56 y.o., female Today's Date: 12/29/2021  PCP: Elizabeth PalauAnderson, Teresa, FNP REFERRING PROVIDER: Elizabeth PalauAnderson, Teresa, FNP   PT End of Session - 12/29/21 0724     Visit Number 2    Number of Visits 21    Date for PT Re-Evaluation 03/22/22    Authorization Type A[employer] MCR    PT Start Time 0718    PT Stop Time 0759    PT Time Calculation (min) 41 min    Activity Tolerance Patient tolerated treatment well    Behavior During Therapy WFL for tasks assessed/performed             Past Medical History:  Diagnosis Date   Allergic rhinitis    Anxiety    Arthritis    Back pain    Constipated    Depression    Diaphragmatic hernia    Fatty liver    GERD (gastroesophageal reflux disease)    Hypertension    Hypothyroidism    IBS (irritable bowel syndrome)    Joint pain    Kidney problem    Lactose intolerance    Morbid obesity (HCC)    OSA (obstructive sleep apnea)    Osteoarthritis    Seasonal allergies    Tinnitus    Vitamin D deficiency    Past Surgical History:  Procedure Laterality Date   CARPAL TUNNEL RELEASE     rt/lt   FINGER ARTHRODESIS  11/08/2011   Procedure: ARTHRODESIS FINGER;  Surgeon: Wyn Forsterobert V Sypher Jr., MD;  Location: [facility];  Service: Orthopedics;  Laterality: Left;  left thumb carpal-metacarpal fusion   KNEE ARTHROSCOPY     both knees   PARTIAL KNEE ARTHROPLASTY  11/05/2012   Procedure: UNICOMPARTMENTAL KNEE;  Surgeon: Shelda PalMatthew D Olin, MD;  Location: [facility];  Service: Orthopedics;  Laterality: Right;   TONSILLECTOMY     TOTAL SHOULDER ARTHROPLASTY Right 06/25/2020   Procedure: TOTAL SHOULDER ARTHROPLASTY;  Surgeon: Yolonda Kidaogers, Jason Patrick, MD;  Location: [facility];  Service: Orthopedics;  Laterality: Right;  2.5 hrs   URETER SURGERY     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-little lt kidney function, has one functioning kidney   WISDOM TOOTH  EXTRACTION     Patient Active Problem List   Diagnosis Date Noted   Hyperkalemia 08/11/2021   Major depressive disorder, recurrent episode, mild with anxious distress (HCC) 06/21/2021   Lumbar radiculopathy 06/01/2021   Other hyperlipidemia 06/01/2021   Hypothyroidism 11/30/2020   Prediabetes 11/30/2020   Constipated    DDD (degenerative disc disease), cervical 08/09/2018   Neck pain 07/21/2018   Chronic back pain 04/03/2018   Spondylolisthesis 04/03/2018   Osteoarthritis of right knee 02/23/2018   Osteoarthritis of left knee 02/05/2018   Acquired hypothyroidism 12/13/2016   History of systemic reaction to bee sting 12/13/2016   Atrophy of left kidney 09/10/2015   Hydronephrosis of right kidney 09/10/2015   Obstructive sleep apnea syndrome, mild 08/05/2014   Expected blood loss anemia 11/06/2012   Obese 11/06/2012   S/P right UKR 11/05/2012   Major depression, recurrent, chronic (HCC) 02/08/2011   Vitamin D deficiency 04/30/2010   Diaphragmatic hernia 09/11/2007      REFERRING PROVIDER: Venita LickBrooks, Dahari, MD   REFERRING DIAG: M54.51 (ICD-10-CM) - Vertebrogenic low back pain    THERAPY DIAG:  Pain, lumbar region - Plan: PT plan of care cert/re-cert   Muscle weakness (generalized) - Plan: PT plan of care cert/re-cert  Difficulty walking - Plan: PT plan of care cert/re-cert   ONSET DATE: 01/2018   SUBJECTIVE:                                                                                                                                                                                            SUBJECTIVE STATEMENT:  Pt reports that one of the exercises ("partial sit up") gives her vertigo symptoms.  Otherwise HEP going ok.    PERTINENT HISTORY:  Chronic pain, OA, R partial knee and R shoulder replacements, DDD, depression, vertigo (last 6 months)   PAIN:  Are you having pain? No NPRS scale: 0/10 Pain location: Center of the back near tailbone Pain orientation:  Bilateral  PAIN TYPE: constant pressure, "drilling"  Aggravating factors: walking, bending, lifting, sitting too long, sleeping on back Relieving factors: Tylenol, hydrocodone   PRECAUTIONS: None   WEIGHT BEARING RESTRICTIONS No   FALLS:  Has patient fallen in last 6 months? No, Number of falls: 0   LIVING ENVIRONMENT: Lives with: lives alone Lives in: House/apartment Stairs: Yes; 5 steps to enter Has following equipment at home: Single point cane and walking stick, does not use since weight loss ; walking on uneven    OCCUPATION: disability; helps with farm animal rescue   PLOF: Independent with basic ADLs   PATIENT GOALS : Prolonging having to have back surgery     OBJECTIVE:    *All objective findings taken at eval unless otherwise noted.  DIAGNOSTIC FINDINGS:  N/A in Epic   PATIENT SURVEYS:  FOTO unavailable   SCREENING FOR RED FLAGS: Bowel or bladder incontinence: No Spinal tumors: No Cauda equina syndrome: No Compression fracture: No     COGNITION:          Overall cognitive status: Within functional limits for tasks assessed                        SENSATION:          Light touch: Deficits L3 NT with touch            POSTURE:  Exaggerated lumbar lordosis in standing   PALPATION: Signifcant TTP and hypertonicity of bilateral L3-5 paraspinals   Joint mobility: lower lumbar facet joints stiff and resting in extension position   LUMBARAROM/PROM   A/PROM A/PROM  12/22/2021  Flexion WFL  Extension 75% p!  Right lateral flexion WFL  Left lateral flexion WFL p!  Right rotation WFL  Left rotation WFL p!   (Blank rows = not tested)   LE AROM: moderately limited in all planes at the hip on  L; R WFL; R knee significantly limited with knee flexion; ext [facility]   LE MMT:   MMT Right 12/22/2021 Left 12/22/2021  Hip flexion 4+/5 4/5  Hip extension      Hip abduction 4+/5 4/5  Hip adduction 4+/5 4+/5  Knee flexion 4+/5 4/5  Knee extension 4+/5 4/5   (Blank  rows = not tested)   LUMBAR SPECIAL TESTS:  Straight leg raise test: Positive, Slump test: Positive, and FABER test: Positive   FUNCTIONAL TESTS:  5 times sit to stand: 17.5s   GAIT: Distance walked: 68ft Assistive device utilized: None Level of assistance: Complete Independence Comments: antalgic, compensated Trendelenburg   Stairs: R UE support ascend reciprocal; descend R UE support step to with R leading due to knee pain; bilateral trunk sway with ascend and descent       TODAY'S TREATMENT  12/29/21:  Pt seen for aquatic therapy today.  Treatment took place in water 3.25-4 ft in depth at the [facility] pool. Temp of water was 89.  Pt entered/exited the pool via stairs independently, step to pattern,  with single rail.  Warm up of walking forward, backward, side stepping, chest deep water. Holding onto wall:  Squats x 15 Hip abdct x 20 each leg, Hip ext x 20 each leg Forward walking holding rainbow dumbbells under water Ab set gently submerging yellow noodle, 5 sec x 8 Shoulder ext to/from noodle pushing yellow noodle to thighs with core engaged Light jogging forward/backward with palms under water for resistance Bicycle with yellow noodle Jumping jacks with noodle between legs  Hip flexor stretch (without UE support)  Pt requires buoyancy for support and to offload joints with strengthening exercises. Viscosity of the water is needed for resistance of strengthening; water current perturbations provides challenge to standing balance unsupported, requiring increased core activation.   Eval Exercises Supine Posterior Pelvic Tilt - 2 x daily - 7 x weekly - 2 sets - 10 reps - 2 hold Neutral Lumbar Spine Curl Up - 2 x daily - 7 x weekly - 2 sets - 10 reps - 2 hold Seated Lumbar Flexion Stretch - 2 x daily - 7 x weekly - 1 sets - 10 reps - 2 hold       PATIENT EDUCATION:  Education details: Intro to [employer], expectations of DOMS Person educated: Patient Education  method: Explanation, Demonstration, Tactile cues, Verbal cues, Education comprehension: verbalized understanding, returned demonstration, verbal cues required, and tactile cues required     HOME EXERCISE PROGRAM: Access Code: [email or web address] URL: [email or web address] Date: 12/22/2021 Prepared by: Zebedee Iba   ASSESSMENT:   CLINICAL IMPRESSION: Vertigo symptoms reported with partial sit up in HEP; advised pt to hold this exercise for now. Yellow dumbbell under water increased Rt shoulder discomfort; rainbow dumbbell tolerated. She reported some "talking" of 3/10 in Rt knee after completing supported jumping jacks.  Pt is confident in the aquatic environment completing some exercises without support.  Goals are ongoing.      OBJECTIVE IMPAIRMENTS Abnormal gait, decreased activity tolerance, decreased balance, decreased endurance, decreased mobility, difficulty walking, decreased ROM, decreased strength, hypomobility, increased muscle spasms, impaired flexibility, impaired UE functional use, improper body mechanics, postural dysfunction, obesity, and pain.    ACTIVITY LIMITATIONS cleaning, community activity, occupation, yard work, shopping, and exercise .    PERSONAL FACTORS Age, Behavior pattern, Fitness, Past/current experiences, Time since onset of injury/illness/exacerbation, and 1-2 comorbidities:    are also affecting patient's functional outcome.      REHAB POTENTIAL: Fair  CLINICAL DECISION MAKING: Evolving/moderate complexity   EVALUATION COMPLEXITY: Low     GOALS:     SHORT TERM GOALS:   STG Name Target Date Goal status  1 Pt will become independent with HEP in order to demonstrate synthesis of PT education.   Baseline:  02/02/2022 INITIAL  2 Pt will report at least 2 pt reduction on NPRS scale for pain in order to demonstrate functional improvement with household activity, self care, and ADL.   Baseline:  02/02/2022 INITIAL  3 Pt will be able to  demonstrate/report ability to sit/stand/sleep for extended periods of time without pain in order to demonstrate functional improvement and tolerance to static positioning.  Baseline: 02/02/2022 INITIAL    LONG TERM GOALS:    LTG Name Target Date Goal status  1 Pt  will become independent with final HEP in order to demonstrate synthesis of PT education.   Baseline: 03/16/2022 INITIAL  2 Pt will be able to perform 5XSTS in under 12s  in order to demonstrate functional improvement above the cut off score for adults.  Baseline: 03/16/2022 INITIAL  3 Pt will be able to demonstrate/report ability to walk >30 mins without pain in order to demonstrate functional improvement and tolerance to exercise and community mobility.   Baseline: 03/16/2022 INITIAL  4 Pt will be able to lift/squat/hold >25 lbs in order to demonstrate functional improvement in lumbopelvic strength for full ADL and exercise.  Baseline: 03/16/2022 INITIAL    PLAN: PT FREQUENCY: 1-2x/week   PT DURATION: 12 weeks (likely D/C by 8 weeks)   PLANNED INTERVENTIONS: Therapeutic exercises, Therapeutic activity, Neuromuscular re-education, Balance training, Gait training, Patient/Family education, Joint manipulation, Joint mobilization, Stair training, DME instructions, Aquatic Therapy, Dry Needling, Electrical stimulation, Spinal manipulation, Spinal mobilization, Cryotherapy, Moist heat, Taping, Vasopneumatic device, Traction, Ultrasound, Ionotophoresis 4mg /ml Dexamethasone, and Manual therapy   PLAN FOR NEXT SESSION: assess response to aquatics- lumbar flexion preference, hip stretching, general movement [employer], PTA 12/29/21 7:59 AM

## 2021-12-29 ENCOUNTER — Ambulatory Visit (HOSPITAL_BASED_OUTPATIENT_CLINIC_OR_DEPARTMENT_OTHER): Payer: Medicare HMO | Admitting: Physical Therapy

## 2021-12-29 ENCOUNTER — Other Ambulatory Visit: Payer: Self-pay

## 2021-12-29 ENCOUNTER — Ambulatory Visit (INDEPENDENT_AMBULATORY_CARE_PROVIDER_SITE_OTHER): Payer: Medicare HMO | Admitting: Adult Health

## 2021-12-29 ENCOUNTER — Encounter (INDEPENDENT_AMBULATORY_CARE_PROVIDER_SITE_OTHER): Payer: Self-pay | Admitting: Adult Health

## 2021-12-29 VITALS — BP 128/70 | HR 79 | Temp 98.1°F | Ht 62.0 in | Wt 212.0 lb

## 2021-12-29 DIAGNOSIS — E875 Hyperkalemia: Secondary | ICD-10-CM | POA: Diagnosis not present

## 2021-12-29 DIAGNOSIS — M545 Low back pain, unspecified: Secondary | ICD-10-CM

## 2021-12-29 DIAGNOSIS — M6281 Muscle weakness (generalized): Secondary | ICD-10-CM | POA: Diagnosis not present

## 2021-12-29 DIAGNOSIS — E669 Obesity, unspecified: Secondary | ICD-10-CM

## 2021-12-29 DIAGNOSIS — Z6838 Body mass index (BMI) 38.0-38.9, adult: Secondary | ICD-10-CM | POA: Diagnosis not present

## 2021-12-29 DIAGNOSIS — R262 Difficulty in walking, not elsewhere classified: Secondary | ICD-10-CM

## 2021-12-29 DIAGNOSIS — E559 Vitamin D deficiency, unspecified: Secondary | ICD-10-CM

## 2021-12-29 NOTE — Therapy (Signed)
OUTPATIENT PHYSICAL THERAPY TREATMENT NOTE   Patient Name: Melissa [name] MRN: [id number] DOB:28-Feb-1966, 56 y.o., female Today's Date: 12/30/2021  PCP: Vicenta Aly, [place] REFERRING PROVIDER: Vicenta Aly, FNP   PT End of Session - 12/30/21 [id number]     Visit Number 3    Number of Visits 21    Date for PT Re-Evaluation 03/22/22    Authorization Type Aet[employer] MCR    PT Start Time [id number]    PT Stop Time 0840    PT Time Calculation (min) 45 min    Activity Tolerance Patient tolerated treatment well    Behavior During Therapy WFL for tasks assessed/performed              Past Medical History:  Diagnosis Date   Allergic rhinitis    Anxiety    Arthritis    Back pain    Constipated    Depression    Diaphragmatic hernia    Fatty liver    GERD (gastroesophageal reflux disease)    Hypertension    Hypothyroidism    IBS (irritable bowel syndrome)    Joint pain    Kidney problem    Lactose intolerance    Morbid obesity (HCC)    OSA (obstructive sleep apnea)    Osteoarthritis    Seasonal allergies    Tinnitus    Vitamin D deficiency    Past Surgical History:  Procedure Laterality Date   CARPAL TUNNEL RELEASE     rt/lt   FINGER ARTHRODESIS  11/08/2011   Procedure: ARTHRODESIS FINGER;  Surgeon: Cammie Sickle., MD;  Location: [place];  Service: Orthopedics;  Laterality: Left;  left thumb carpal-metacarpal fusion   KNEE ARTHROSCOPY     both knees   PARTIAL KNEE ARTHROPLASTY  11/05/2012   Procedure: UNICOMPARTMENTAL KNEE;  Surgeon: Mauri Pole, MD;  Location: [facility];  Service: Orthopedics;  Laterality: Right;   TONSILLECTOMY     TOTAL SHOULDER ARTHROPLASTY Right 06/25/2020   Procedure: TOTAL SHOULDER ARTHROPLASTY;  Surgeon: Nicholes Stairs, MD;  Location: [facility];  Service: Orthopedics;  Laterality: Right;  2.5 hrs   URETER SURGERY     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-little lt kidney function, has one functioning kidney   WISDOM TOOTH  EXTRACTION     Patient Active Problem List   Diagnosis Date Noted   Hyperkalemia 08/11/2021   Major depressive disorder, recurrent episode, mild with anxious distress ([place]) 06/21/2021   Lumbar radiculopathy 06/01/2021   Other hyperlipidemia 06/01/2021   Hypothyroidism 11/30/2020   Prediabetes 11/30/2020   Constipated    DDD (degenerative disc disease), cervical 08/09/2018   Neck pain 07/21/2018   Chronic back pain 04/03/2018   Spondylolisthesis 04/03/2018   Osteoarthritis of right knee 02/23/2018   Osteoarthritis of left knee 02/05/2018   Acquired hypothyroidism 12/13/2016   History of systemic reaction to bee sting 12/13/2016   Atrophy of left kidney 09/10/2015   Hydronephrosis of right kidney 09/10/2015   Obstructive sleep apnea syndrome, mild 08/05/2014   Expected blood loss anemia 11/06/2012   Obese 11/06/2012   S/P right UKR 11/05/2012   Major depression, recurrent, chronic ([place]) 02/08/2011   Vitamin D deficiency 04/30/2010   Diaphragmatic hernia 09/11/2007      REFERRING PROVIDER: Melina Schools, MD   REFERRING DIAG: M54.51 (ICD-10-CM) - Vertebrogenic low back pain    THERAPY DIAG:  Pain, lumbar region - Plan: PT plan of care cert/re-cert   Muscle weakness (generalized) - Plan: PT plan of care cert/re-cert  Difficulty walking - Plan: PT plan of care cert/re-cert   ONSET DATE: [id number]   SUBJECTIVE:                                                                                                                                                                    SUBJECTIVE STATEMENT:   Pt reports she woke up with a little more pain in back this morning. Not sure why.  She felt fine after last session.   PERTINENT HISTORY:  Chronic pain, OA, R partial knee and R shoulder replacements, DDD, depression, vertigo (last 6 months)   PAIN:  Are you having pain? No NPRS scale: 4/10 Pain location: low back Pain orientation: Bilateral  PAIN TYPE: constant pressure,  "drilling"  Aggravating factors: walking, bending, lifting, sitting too long, sleeping on back Relieving factors: Tylenol, hydrocodone   PRECAUTIONS: None   WEIGHT BEARING RESTRICTIONS No   FALLS:  Has patient fallen in last 6 months? No, Number of falls: 0   LIVING ENVIRONMENT: Lives with: lives alone Lives in: House/apartment Stairs: Yes; 5 steps to enter Has following equipment at home: Single point cane and walking stick, does not use since weight loss ; walking on uneven    OCCUPATION: disability; helps with farm animal rescue   PLOF: Independent with basic ADLs   PATIENT GOALS : Prolonging having to have back surgery     OBJECTIVE:    *All objective findings taken at eval unless otherwise noted.  DIAGNOSTIC FINDINGS:  N/A in Epic   PATIENT SURVEYS:  FOTO unavailable   SCREENING FOR RED FLAGS: Bowel or bladder incontinence: No Spinal tumors: No Cauda equina syndrome: No Compression fracture: No     COGNITION:          Overall cognitive status: Within functional limits for tasks assessed                        SENSATION:          Light touch: Deficits L3 NT with touch            POSTURE:  Exaggerated lumbar lordosis in standing   PALPATION: Signifcant TTP and hypertonicity of bilateral L3-5 paraspinals   Joint mobility: lower lumbar facet joints stiff and resting in extension position   LUMBARAROM/PROM   A/PROM A/PROM  12/22/2021  Flexion WFL  Extension 75% p!  Right lateral flexion WFL  Left lateral flexion WFL p!  Right rotation [facility]  Left rotation WFL p!   (Blank rows = not tested)   LE AROM: moderately limited in all planes at the hip on L; R WFL; R knee significantly limited with knee flexion; ext [facility]   LE MMT:   MMT Right 12/22/2021 Left 12/22/2021  Hip flexion 4+/5 4/5  Hip extension      Hip abduction 4+/5 4/5  Hip adduction 4+/5 4+/5  Knee flexion 4+/5 4/5  Knee extension 4+/5 4/5   (Blank rows = not tested)   LUMBAR SPECIAL TESTS:   Straight leg raise test: Positive, Slump test: Positive, and FABER test: Positive   FUNCTIONAL TESTS:  5 times sit to stand: 17.5s   GAIT: Distance walked: 97ft Assistive device utilized: None Level of assistance: Complete Independence Comments: antalgic, compensated Trendelenburg   Stairs: R UE support ascend reciprocal; descend R UE support step to with R leading due to knee pain; bilateral trunk sway with ascend and descent       TODAY'S TREATMENT  12/29/21:  Pt seen for aquatic therapy today.  Treatment took place in water 3.25-4 ft in depth at the [facility] pool. Temp of water was 89.  Pt entered/exited the pool via stairs independently, step to pattern,  with single rail.  Warm up of walking forward in chest deep water. Supported supine float x 4 min Supported bicycle Core activation submerging kickboard under water with forward row x 20; trunk rotation (small range) with kickboard x 20 (increased L LBP) Walking with rainbow dumbbells under water - forward / backward  Walking lunges forward/ backward with rainbow dumbbells under water Hip/knee flexion crossing midline x 10 each leg (increased back pain on Lt) Hip abdct / knee flexion x 10 each leg Side step with squat and arm abdct/ add  Trialed pendulum front/ back with yellow dumbbells; unable to tolerate   Pt requires buoyancy for support and to offload joints with strengthening exercises. Viscosity of the water is needed for resistance of strengthening; water current perturbations provides challenge to standing balance unsupported, requiring increased core activation.   Eval Exercises Supine Posterior Pelvic Tilt - 2 x daily - 7 x weekly - 2 sets - 10 reps - 2 hold Neutral Lumbar Spine Curl Up - 2 x daily - 7 x weekly - 2 sets - 10 reps - 2 hold Seated Lumbar Flexion Stretch - 2 x daily - 7 x weekly - 1 sets - 10 reps - 2 hold       PATIENT EDUCATION:  Education details: Intro to [employer], expectations  of DOMS Person educated: Patient Education method: Explanation, Demonstration, Tactile cues, Verbal cues, Education comprehension: verbalized understanding, returned demonstration, verbal cues required, and tactile cues required     HOME EXERCISE PROGRAM: Access Code: [email or web address] URL: [email or web address] Date: 12/22/2021 Prepared by: Zebedee Iba   ASSESSMENT:   CLINICAL IMPRESSION: Lumbar rotation and extension continue to be aggravating actions.  Pt's pain reduced to 0/10 with supine float and change of positions.  All other exercises tolerated well, without increase in knee or back pain.  Goals are ongoing.      OBJECTIVE IMPAIRMENTS Abnormal gait, decreased activity tolerance, decreased balance, decreased endurance, decreased mobility, difficulty walking, decreased ROM, decreased strength, hypomobility, increased muscle spasms, impaired flexibility, impaired UE functional use, improper body mechanics, postural dysfunction, obesity, and pain.    ACTIVITY LIMITATIONS cleaning, community activity, occupation, yard work, shopping, and exercise .    PERSONAL FACTORS Age, Behavior pattern, Fitness, Past/current experiences, Time since onset of injury/illness/exacerbation, and 1-2 comorbidities:    are also affecting patient's functional outcome.      REHAB POTENTIAL: Fair     CLINICAL DECISION MAKING: Evolving/moderate complexity   EVALUATION COMPLEXITY: Low     GOALS:     SHORT TERM GOALS:  STG Name Target Date Goal status  1 Pt will become independent with HEP in order to demonstrate synthesis of PT education.   Baseline:  02/02/2022 INITIAL  2 Pt will report at least 2 pt reduction on NPRS scale for pain in order to demonstrate functional improvement with household activity, self care, and ADL.   Baseline:  02/02/2022 INITIAL  3 Pt will be able to demonstrate/report ability to sit/stand/sleep for extended periods of time without pain in order to demonstrate  functional improvement and tolerance to static positioning.  Baseline: 02/02/2022 INITIAL    LONG TERM GOALS:    LTG Name Target Date Goal status  1 Pt  will become independent with final HEP in order to demonstrate synthesis of PT education.   Baseline: 03/16/2022 INITIAL  2 Pt will be able to perform 5XSTS in under 12s  in order to demonstrate functional improvement above the cut off score for adults.  Baseline: 03/16/2022 INITIAL  3 Pt will be able to demonstrate/report ability to walk >30 mins without pain in order to demonstrate functional improvement and tolerance to exercise and community mobility.   Baseline: 03/16/2022 INITIAL  4 Pt will be able to lift/squat/hold >25 lbs in order to demonstrate functional improvement in lumbopelvic strength for full ADL and exercise.  Baseline: 03/16/2022 INITIAL    PLAN: PT FREQUENCY: 1-2x/week   PT DURATION: 12 weeks (likely D/C by 8 weeks)   PLANNED INTERVENTIONS: Therapeutic exercises, Therapeutic activity, Neuromuscular re-education, Balance training, Gait training, Patient/Family education, Joint manipulation, Joint mobilization, Stair training, DME instructions, Aquatic Therapy, Dry Needling, Electrical stimulation, Spinal manipulation, Spinal mobilization, Cryotherapy, Moist heat, Taping, Vasopneumatic device, Traction, Ultrasound, Ionotophoresis 4mg /ml Dexamethasone, and Manual therapy   PLAN FOR NEXT SESSION: assess response to aquatics- lumbar flexion preference, hip stretching, general movement  Kerin Perna, PTA 12/30/21 1:03 PM

## 2021-12-29 NOTE — Progress Notes (Signed)
? ? ? ?Chief Complaint:  ? ?OBESITY ?Melissa [name] is here to discuss her progress with her obesity treatment plan along with follow-up of her obesity related diagnoses. Melissa [name] is on the Category 2 Plan and states she is following her eating plan approximately 60% of the time. Melissa [name] states she is going to the gym for 40-45 minutes 3-4 times per week. ? ?Today's visit was #: 71 ?Starting weight: 256 lbs ?Starting date: 11/18/2019 ?Today's weight: 212 lb ?Today's date: 12/29/2021 ?Total lbs lost to date: 44 lbs ?Total lbs lost since last in-office visit: 0 ? ?Interim History: ? [name] has had a frustrating week: ?1) Last month she experienced 3 days without waster at home last month.   ?Trench dug to run new power line.  Trench digging cut trough internet line. ? ?2) While loading the digging machine into back of her truck -Saturday -  broke back window of turck. ? ?Of note:  Started Aquatic therapy today. Will follow up with Orthopedic specialist once PT is complete. ? ?Subjective:  ? ?1. Hyperkalemia ?She continues to deny acute cardiac symptoms. ? ?2. Vitamin D deficiency ?She has remained on a vitamin D supplement since 12/02/2021. ?Om 12/01/2021. Vitamin D level 102.2. - denies N/V/muscle weakness. ? ?Assessment/Plan:  ? ?1. Hyperkalemia ?Monitor labs. ? ?2. Vitamin D deficiency ?Remain off all Vit D Supplementation. ?Check Vit D Level April 2023. ? ?3. Obesity, current BMI 38.8 ?Check labs in April 2023. ? ?Melissa [name]  currently in the action stage of change. As such, her goal is to maintain weight for now. She has agreed to the Category 2 Plan.  ? ?Check IC at next office visit on 01/11/2022. ? ?Exercise goals:  As is. ? ?Behavioral modification strategies: increasing lean protein intake, decreasing simple carbohydrates, meal planning and cooking strategies, keeping healthy foods in the home, and planning for success. ? ?Melissa [name] has agreed to follow-up with our clinic in 2 weeks. She was informed of the importance of frequent  follow-up visits to maximize her success with intensive lifestyle modifications for her multiple health conditions.  ? ?Objective:  ? ?Blood pressure 128/70, pulse 79, temperature 98.1 ?F (36.7 ?C), height 5\' 2"  (1.575 m), weight 212 lb (96.2 kg), SpO2 99 %. ?Body mass index is 38.78 kg/m?. ? ?General: Cooperative, alert, well developed, in no acute distress. ?HEENT: Conjunctivae and lids unremarkable. ?Cardiovascular: Regular rhythm.  ?Lungs: Normal work of breathing. ?Neurologic: No focal deficits.  ? ?Lab Results  ?Component Value Date  ? CREATININE 0.96 12/01/2021  ? BUN 20 12/01/2021  ? NA 141 12/01/2021  ? K 5.0 12/01/2021  ? CL 101 12/01/2021  ? CO2 20 12/01/2021  ? ?Lab Results  ?Component Value Date  ? ALT 21 12/01/2021  ? AST 26 12/01/2021  ? ALKPHOS 93 12/01/2021  ? BILITOT 0.3 12/01/2021  ? ?Lab Results  ?Component Value Date  ? HGBA1C 5.6 12/01/2021  ? HGBA1C 5.7 (H) 07/15/2021  ? HGBA1C 5.7 (H) 01/13/2021  ? HGBA1C 5.5 09/22/2020  ? HGBA1C 5.9 (H) 06/18/2020  ? ?Lab Results  ?Component Value Date  ? INSULIN 12.5 12/01/2021  ? INSULIN 8.8 07/15/2021  ? INSULIN 15.6 01/13/2021  ? INSULIN 10.7 09/22/2020  ? INSULIN 12.9 06/18/2020  ? ?Lab Results  ?Component Value Date  ? TSH 1.580 12/01/2021  ? ?Lab Results  ?Component Value Date  ? CHOL 195 05/06/2021  ? HDL 68 05/06/2021  ? LDLCALC 112 (H) 05/06/2021  ? TRIG 82 05/06/2021  ? CHOLHDL 2.9 05/06/2021  ? ?  Lab Results  ?Component Value Date  ? VD25OH 102.0 (H) 12/01/2021  ? VD25OH 77.1 05/06/2021  ? VD25OH 69.7 01/13/2021  ? ?Lab Results  ?Component Value Date  ? WBC 8.1 06/19/2020  ? HGB 13.2 06/19/2020  ? HCT 42.4 06/19/2020  ? MCV 88.3 06/19/2020  ? PLT 374 06/19/2020  ? ?Attestation Statements:  ? ?Reviewed by clinician on day of visit: allergies, medications, problem list, medical history, surgical history, family history, social history, and previous encounter notes. ? ?Time spent on visit including pre-visit chart review and post-visit care and  charting was 28 minutes.  ? ?I, Water quality scientist, CMA, am acting as Location manager for Mina Marble, NP. ? ?I have reviewed the above documentation for accuracy and completeness, and I agree with the above. -  Female Iafrate d. Hakeen Shipes, NP-C ?

## 2021-12-30 ENCOUNTER — Ambulatory Visit (HOSPITAL_BASED_OUTPATIENT_CLINIC_OR_DEPARTMENT_OTHER): Payer: Medicare HMO | Admitting: Physical Therapy

## 2021-12-30 ENCOUNTER — Encounter (HOSPITAL_BASED_OUTPATIENT_CLINIC_OR_DEPARTMENT_OTHER): Payer: Self-pay | Admitting: Physical Therapy

## 2021-12-30 DIAGNOSIS — M545 Low back pain, unspecified: Secondary | ICD-10-CM | POA: Diagnosis not present

## 2021-12-30 DIAGNOSIS — R262 Difficulty in walking, not elsewhere classified: Secondary | ICD-10-CM

## 2021-12-30 DIAGNOSIS — M6281 Muscle weakness (generalized): Secondary | ICD-10-CM

## 2022-01-02 NOTE — Therapy (Addendum)
OUTPATIENT PHYSICAL THERAPY TREATMENT NOTE   Patient Name: Melissa [name] MRN: [id number] DOB:1966-07-06, 56 y.o., female Today's Date: 01/03/2022  PCP: Elizabeth PalauAnderson, Teresa, FNP REFERRING PROVIDER: Elizabeth PalauAnderson, Teresa, FNP   PT End of Session - 01/03/22 0727     Visit Number 4    Number of Visits 21    Date for PT Re-Evaluation 03/22/22    Authorization Type [employer] MCR    PT Start Time 0716    PT Stop Time 0758    PT Time Calculation (min) 42 min    Activity Tolerance Patient tolerated treatment well    Behavior During Therapy WFL for tasks assessed/performed               Past Medical History:  Diagnosis Date   Allergic rhinitis    Anxiety    Arthritis    Back pain    Constipated    Depression    Diaphragmatic hernia    Fatty liver    GERD (gastroesophageal reflux disease)    Hypertension    Hypothyroidism    IBS (irritable bowel syndrome)    Joint pain    Kidney problem    Lactose intolerance    Morbid obesity (HCC)    OSA (obstructive sleep apnea)    Osteoarthritis    Seasonal allergies    Tinnitus    Vitamin D deficiency    Past Surgical History:  Procedure Laterality Date   CARPAL TUNNEL RELEASE     rt/lt   FINGER ARTHRODESIS  11/08/2011   Procedure: ARTHRODESIS FINGER;  Surgeon: Wyn Forsterobert V Sypher Jr., MD;  Location: [facility];  Service: Orthopedics;  Laterality: Left;  left thumb carpal-metacarpal fusion   KNEE ARTHROSCOPY     both knees   PARTIAL KNEE ARTHROPLASTY  11/05/2012   Procedure: UNICOMPARTMENTAL KNEE;  Surgeon: Shelda PalMatthew D Olin, MD;  Location: [facility];  Service: Orthopedics;  Laterality: Right;   TONSILLECTOMY     TOTAL SHOULDER ARTHROPLASTY Right 06/25/2020   Procedure: TOTAL SHOULDER ARTHROPLASTY;  Surgeon: Yolonda Kidaogers, Jason Patrick, MD;  Location: [facility];  Service: Orthopedics;  Laterality: Right;  2.5 hrs   URETER SURGERY     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-little lt kidney function, has one functioning kidney   WISDOM  TOOTH EXTRACTION     Patient Active Problem List   Diagnosis Date Noted   Hyperkalemia 08/11/2021   Major depressive disorder, recurrent episode, mild with anxious distress (HCC) 06/21/2021   Lumbar radiculopathy 06/01/2021   Other hyperlipidemia 06/01/2021   Hypothyroidism 11/30/2020   Prediabetes 11/30/2020   Constipated    DDD (degenerative disc disease), cervical 08/09/2018   Neck pain 07/21/2018   Chronic back pain 04/03/2018   Spondylolisthesis 04/03/2018   Osteoarthritis of right knee 02/23/2018   Osteoarthritis of left knee 02/05/2018   Acquired hypothyroidism 12/13/2016   History of systemic reaction to bee sting 12/13/2016   Atrophy of left kidney 09/10/2015   Hydronephrosis of right kidney 09/10/2015   Obstructive sleep apnea syndrome, mild 08/05/2014   Expected blood loss anemia 11/06/2012   Obese 11/06/2012   S/P right UKR 11/05/2012   Major depression, recurrent, chronic (HCC) 02/08/2011   Vitamin D deficiency 04/30/2010   Diaphragmatic hernia 09/11/2007      REFERRING PROVIDER: Venita LickBrooks, Dahari, MD   REFERRING DIAG: M54.51 (ICD-10-CM) - Vertebrogenic low back pain    THERAPY DIAG:  Pain, lumbar region - Plan: PT plan of care cert/re-cert   Muscle weakness (generalized) - Plan: PT plan of care cert/re-cert  Difficulty walking - Plan: PT plan of care cert/re-cert   ONSET DATE: 01/2018   SUBJECTIVE:                                                                                                                                                                    SUBJECTIVE STATEMENT:   Pt reports a little more Rt knee pain lately.  She is still taking hydrocodone for pain daily.  PERTINENT HISTORY:  Chronic pain, OA, R partial knee and R shoulder replacements, DDD, depression, vertigo (last 6 months)   PAIN:  Are you having pain? yes   NPRS scale: 4/10 Pain location: low back Pain orientation: Bilateral , wrapping around Rt hip and going into  thigh/knee PAIN TYPE: achy, dull Aggravating factors: walking, bending, lifting, sitting too long, sleeping on back Relieving factors: Tylenol, hydrocodone   PRECAUTIONS: None   WEIGHT BEARING RESTRICTIONS No   FALLS:  Has patient fallen in last 6 months? No, Number of falls: 0   LIVING ENVIRONMENT: Lives with: lives alone Lives in: House/apartment Stairs: Yes; 5 steps to enter Has following equipment at home: Single point cane and walking stick, does not use since weight loss ; walking on uneven    OCCUPATION: disability; helps with farm animal rescue   PLOF: Independent with basic ADLs   PATIENT GOALS : Prolonging having to have back surgery     OBJECTIVE:    *All objective findings taken at eval unless otherwise noted.  DIAGNOSTIC FINDINGS:  N/A in Epic   PATIENT SURVEYS:  FOTO unavailable   SCREENING FOR RED FLAGS: Bowel or bladder incontinence: No Spinal tumors: No Cauda equina syndrome: No Compression fracture: No     COGNITION:          Overall cognitive status: Within functional limits for tasks assessed                        SENSATION:          Light touch: Deficits L3 NT with touch            POSTURE:  Exaggerated lumbar lordosis in standing   PALPATION: Signifcant TTP and hypertonicity of bilateral L3-5 paraspinals   Joint mobility: lower lumbar facet joints stiff and resting in extension position   LUMBARAROM/PROM   A/PROM A/PROM  12/22/2021  Flexion WFL  Extension 75% p!  Right lateral flexion WFL  Left lateral flexion WFL p!  Right rotation [facility]  Left rotation WFL p!   (Blank rows = not tested)   LE AROM: moderately limited in all planes at the hip on L; R WFL; R knee significantly limited with knee flexion; ext [facility]   LE MMT:   MMT Right 12/22/2021 Left  12/22/2021  Hip flexion 4+/5 4/5  Hip extension      Hip abduction 4+/5 4/5  Hip adduction 4+/5 4+/5  Knee flexion 4+/5 4/5  Knee extension 4+/5 4/5   (Blank rows = not tested)    LUMBAR SPECIAL TESTS:  Straight leg raise test: Positive, Slump test: Positive, and FABER test: Positive   FUNCTIONAL TESTS:  5 times sit to stand: 17.5s   GAIT: Distance walked: 46ft Assistive device utilized: None Level of assistance: Complete Independence Comments: antalgic, compensated Trendelenburg   Stairs: R UE support ascend reciprocal; descend R UE support step to with R leading due to knee pain; bilateral trunk sway with ascend and descent       TODAY'S TREATMENT  01/03/22:  Pt seen for aquatic therapy today.  Treatment took place in water 3.25-4 ft in depth at the [facility] pool. Temp of water was 89.  Pt entered/exited the pool via stairs independently, step to pattern,  with single rail.  Seated on bench on deck: quad stretch with single foot tucked under bench, back supported x 20 sec x 2reps RLE, 1 reps LLE  Warm up of walking forward/backward and sidestepping in chest deep water. Pilates plank with arms on bench in water with hip ext x 10 each leg Plank with arms on yellow noodle under water x 1 min Squats pushing yellow dumbbell under water x 20, cues for positioning Kickboard partially submerged with push/ pull x 20, for core activiation Walking lunges forward/ backward with rainbow dumbbells under water Sit to/from stand from bench (with blue step in water for feet) x 12 , slight post lean for core engagement prior to forward propulsion. Side step lunge x 5 each side.  Forward/ backward walking  Supine supported float x 4 min   Trial of reg Rock tape applied in upside down horse shoe to medial/lateral Rt knee and crossing at tibial tuberosity with 20% stretch - to decrease pain, decompress tissue, increase proprioception. Pt given safe removal instructions (verbally; pt verbalized understanding)  Pt requires buoyancy for support and to offload joints with strengthening exercises. Viscosity of the water is needed for resistance of strengthening; water  current perturbations provides challenge to standing balance unsupported, requiring increased core activation.   Eval Exercises Supine Posterior Pelvic Tilt - 2 x daily - 7 x weekly - 2 sets - 10 reps - 2 hold Neutral Lumbar Spine Curl Up - 2 x daily - 7 x weekly - 2 sets - 10 reps - 2 hold Seated Lumbar Flexion Stretch - 2 x daily - 7 x weekly - 1 sets - 10 reps - 2 hold       PATIENT EDUCATION:  Education details: Intro to [employer], expectations of DOMS Person educated: Patient Education method: Explanation, Demonstration, Tactile cues, Verbal cues, Education comprehension: verbalized understanding, returned demonstration, verbal cues required, and tactile cues required     HOME EXERCISE PROGRAM: Access Code: [email or web address] URL: [email or web address] Date: 12/22/2021 Prepared by: Zebedee Iba   ASSESSMENT:   CLINICAL IMPRESSION: Pt reported good reduction of back and RLE pain while completing aquatic exercises.  Avoided trunk rotation with resistance in water due to increase in symptoms with this last visit. She did report some soreness (4/10) in Rt shoulder with exercises using floatation device for resistance; eliminated with rest. Progressing towards goals.    OBJECTIVE IMPAIRMENTS Abnormal gait, decreased activity tolerance, decreased balance, decreased endurance, decreased mobility, difficulty walking, decreased ROM, decreased strength, hypomobility, increased muscle spasms, impaired flexibility, impaired  UE functional use, improper body mechanics, postural dysfunction, obesity, and pain.    ACTIVITY LIMITATIONS cleaning, community activity, occupation, yard work, shopping, and exercise .    PERSONAL FACTORS Age, Behavior pattern, Fitness, Past/current experiences, Time since onset of injury/illness/exacerbation, and 1-2 comorbidities:    are also affecting patient's functional outcome.      REHAB POTENTIAL: Fair     CLINICAL DECISION MAKING: Evolving/moderate  complexity   EVALUATION COMPLEXITY: Low     GOALS:     SHORT TERM GOALS:   STG Name Target Date Goal status  1 Pt will become independent with HEP in order to demonstrate synthesis of PT education.   Baseline:  02/02/2022 INITIAL  2 Pt will report at least 2 pt reduction on NPRS scale for pain in order to demonstrate functional improvement with household activity, self care, and ADL.   Baseline:  02/02/2022 INITIAL  3 Pt will be able to demonstrate/report ability to sit/stand/sleep for extended periods of time without pain in order to demonstrate functional improvement and tolerance to static positioning.  Baseline: 02/02/2022 INITIAL    LONG TERM GOALS:    LTG Name Target Date Goal status  1 Pt  will become independent with final HEP in order to demonstrate synthesis of PT education.   Baseline: 03/16/2022 INITIAL  2 Pt will be able to perform 5XSTS in under 12s  in order to demonstrate functional improvement above the cut off score for adults.  Baseline: 03/16/2022 INITIAL  3 Pt will be able to demonstrate/report ability to walk >30 mins without pain in order to demonstrate functional improvement and tolerance to exercise and community mobility.   Baseline: 03/16/2022 INITIAL  4 Pt will be able to lift/squat/hold >25 lbs in order to demonstrate functional improvement in lumbopelvic strength for full ADL and exercise.  Baseline: 03/16/2022 INITIAL    PLAN: PT FREQUENCY: 1-2x/week   PT DURATION: 12 weeks (likely D/C by 8 weeks)   PLANNED INTERVENTIONS: Therapeutic exercises, Therapeutic activity, Neuromuscular re-education, Balance training, Gait training, Patient/Family education, Joint manipulation, Joint mobilization, Stair training, DME instructions, Aquatic Therapy, Dry Needling, Electrical stimulation, Spinal manipulation, Spinal mobilization, Cryotherapy, Moist heat, Taping, Vasopneumatic device, Traction, Ultrasound, Ionotophoresis 4mg /ml Dexamethasone, and Manual therapy    PLAN FOR NEXT SESSION: continue aquatics- lumbar flexion preference, hip stretching, general movement.  Assess response to kinesiology tape.  Avoid resisted trunk rotation.   , PTA 01/03/22 7:45 AM

## 2022-01-03 ENCOUNTER — Other Ambulatory Visit: Payer: Self-pay

## 2022-01-03 ENCOUNTER — Ambulatory Visit (HOSPITAL_BASED_OUTPATIENT_CLINIC_OR_DEPARTMENT_OTHER): Payer: Medicare HMO | Admitting: Physical Therapy

## 2022-01-03 ENCOUNTER — Encounter (HOSPITAL_BASED_OUTPATIENT_CLINIC_OR_DEPARTMENT_OTHER): Payer: Self-pay | Admitting: Physical Therapy

## 2022-01-03 DIAGNOSIS — R262 Difficulty in walking, not elsewhere classified: Secondary | ICD-10-CM

## 2022-01-03 DIAGNOSIS — M545 Low back pain, unspecified: Secondary | ICD-10-CM

## 2022-01-03 DIAGNOSIS — M6281 Muscle weakness (generalized): Secondary | ICD-10-CM | POA: Diagnosis not present

## 2022-01-05 NOTE — Therapy (Signed)
?OUTPATIENT PHYSICAL THERAPY TREATMENT NOTE ? ? ?Patient Name: Melissa [name] ?MRN: [id number] ?DOB:05-18-1966, 56 y.o., female ?Today's Date: 01/06/2022 ? ?PCP: Elizabeth Palau, FNP ?REFERRING PROVIDER: Elizabeth Palau, FNP ? ? PT End of Session - 01/06/22 0817   ? ? Visit Number 5   ? Number of Visits 21   ? Date for PT Re-Evaluation 03/22/22   ? Authorization Type Aetna MCR   ? PT Start Time 0800   ? PT Stop Time 0840   ? PT Time Calculation (min) 40 min   ? Activity Tolerance Patient tolerated treatment well   ? Behavior During Therapy [facility] for tasks assessed/performed   ? ?  ?  ? ?  ? ? ? ? ? ?Past Medical History:  ?Diagnosis Date  ? Allergic rhinitis   ? Anxiety   ? Arthritis   ? Back pain   ? Constipated   ? Depression   ? Diaphragmatic hernia   ? Fatty liver   ? GERD (gastroesophageal reflux disease)   ? Hypertension   ? Hypothyroidism   ? IBS (irritable bowel syndrome)   ? Joint pain   ? Kidney problem   ? Lactose intolerance   ? Morbid obesity (HCC)   ? OSA (obstructive sleep apnea)   ? Osteoarthritis   ? Seasonal allergies   ? Tinnitus   ? Vitamin D deficiency   ? ?Past Surgical History:  ?Procedure Laterality Date  ? CARPAL TUNNEL RELEASE    ? rt/lt  ? FINGER ARTHRODESIS  11/08/2011  ? Procedure: ARTHRODESIS FINGER;  Surgeon: Wyn Forster., MD;  Location: [facility];  Service: Orthopedics;  Laterality: Left;  left thumb carpal-metacarpal fusion  ? KNEE ARTHROSCOPY    ? both knees  ? PARTIAL KNEE ARTHROPLASTY  11/05/2012  ? Procedure: UNICOMPARTMENTAL KNEE;  Surgeon: [name], MD;  Location: [facility];  Service: Orthopedics;  Laterality: Right;  ? TONSILLECTOMY    ? TOTAL SHOULDER ARTHROPLASTY Right 06/25/2020  ? Procedure: TOTAL SHOULDER ARTHROPLASTY;  Surgeon: Yolonda Kida, MD;  Location: [facility];  Service: Orthopedics;  Laterality: Right;  2.5 hrs  ? URETER SURGERY    ? 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 WISDOM  TOOTH EXTRACTION    ? ?Patient Active Problem List  ? Diagnosis Date Noted  ? Hyperkalemia 08/11/2021  ? Major depressive disorder, recurrent episode, mild with anxious distress (HCC) 06/21/2021  ? Lumbar radiculopathy 06/01/2021  ? Other hyperlipidemia 06/01/2021  ? Hypothyroidism 11/30/2020  ? Prediabetes 11/30/2020  ? Constipated   ? DDD (degenerative disc disease), cervical 08/09/2018  ? Neck pain 07/21/2018  ? Chronic back pain 04/03/2018  ? Spondylolisthesis 04/03/2018  ? Osteoarthritis of right knee 02/23/2018  ? Osteoarthritis of left knee 02/05/2018  ? Acquired hypothyroidism 12/13/2016  ? History of systemic reaction to bee sting 12/13/2016  ? Atrophy of left kidney 09/10/2015  ? Hydronephrosis of right kidney 09/10/2015  ? Obstructive sleep apnea syndrome, mild 08/05/2014  ? Expected blood loss anemia 11/06/2012  ? Obese 11/06/2012  ? S/P right UKR 11/05/2012  ? Major depression, recurrent, chronic (HCC) 02/08/2011  ? Vitamin D deficiency 04/30/2010  ? Diaphragmatic hernia 09/11/2007  ? ? ?  ?REFERRING PROVIDER: Venita Lick, MD ?  ?REFERRING DIAG: M54.51 (ICD-10-CM) - Vertebrogenic low back pain  ?  ?THERAPY DIAG:  ?Pain, lumbar region - Plan: PT plan of care cert/re-cert ?  ?Muscle weakness (generalized) - Plan: PT plan of care  cert/re-cert ?  ?Difficulty walking - Plan: PT plan of care cert/re-cert ?  ?ONSET DATE: 01/2018 ?  ?SUBJECTIVE:                                                                                                                                                                    ?SUBJECTIVE STATEMENT: ?  Pt reports she felt some relief with the tape application to knee.  She has worked out the last 2 days with less pain.  She has avoided gym equipment for trunk flexion/ back extension and she has noticed less back pain in afternoons.   ? ?PERTINENT HISTORY:  ?Chronic pain, OA, R partial knee and R shoulder replacements, DDD, depression, vertigo (last 6 months) ?  ?PAIN:  ?Are you  having pain? no   ?NPRS scale: 0/10 ?Pain location:  ?Pain orientation:  ?PAIN TYPE:  ?Aggravating factors: ?Relieving factors:  ?  ?PRECAUTIONS: None ?  ?WEIGHT BEARING RESTRICTIONS No ?  ?FALLS:  ?Has patient fallen in last 6 months? No, Number of falls: 0 ?  ?LIVING ENVIRONMENT: ?Lives with: lives alone ?Lives in: House/apartment ?Stairs: Yes; 5 steps to enter ?Has following equipment at home: Single point cane and walking stick, does not use since weight loss ; walking on uneven  ?  ?OCCUPATION: disability; helps with farm animal rescue ?  ?PLOF: Independent with basic ADLs ?  ?PATIENT GOALS : Prolonging having to have back surgery ?  ?  ?OBJECTIVE:  ?  *All objective findings taken at eval unless otherwise noted.  ?DIAGNOSTIC FINDINGS:  ?N/A in Epic ?  ?PATIENT SURVEYS:  ?FOTO unavailable ?  ?SCREENING FOR RED FLAGS: ?Bowel or bladder incontinence: No ?Spinal tumors: No ?Cauda equina syndrome: No ?Compression fracture: No ?  ?  ?COGNITION: ?         Overall cognitive status: Within functional limits for tasks assessed              ?          ?SENSATION: ?         Light touch: Deficits L3 NT with touch  ?          ?POSTURE:  ?Exaggerated lumbar lordosis in standing ?  ?PALPATION: ?Signifcant TTP and hypertonicity of bilateral L3-5 paraspinals ?  ?Joint mobility: lower lumbar facet joints stiff and resting in extension position ?  ?LUMBARAROM/PROM ?  ?A/PROM A/PROM  ?12/22/2021  ?Flexion WFL  ?Extension 75% p!  ?Right lateral flexion WFL  ?Left lateral flexion WFL p!  ?Right rotation WFL  ?Left rotation WFL p!  ? (Blank rows = not tested) ?  ?LE AROM: moderately limited in all planes at the hip on L; R WFL; R knee significantly limited with knee flexion; ext WFL ?  ?  LE MMT: ?  ?MMT Right ?12/22/2021 Left ?12/22/2021  ?Hip flexion 4+/5 4/5  ?Hip extension      ?Hip abduction 4+/5 4/5  ?Hip adduction 4+/5 4+/5  ?Knee flexion 4+/5 4/5  ?Knee extension 4+/5 4/5  ? (Blank rows = not tested) ?  ?LUMBAR SPECIAL TESTS:   ?Straight leg raise test: Positive, Slump test: Positive, and FABER test: Positive ?  ?FUNCTIONAL TESTS:  ?5 times sit to stand: 17.5s ?  ?GAIT: ?Distance walked: 14ft ?Assistive device utilized: None ?Level of assistance: Complete Independence ?Comments: antalgic, compensated Trendelenburg ?  ?Stairs: R UE support ascend reciprocal; descend R UE support step to with R leading due to knee pain; bilateral trunk sway with ascend and descent ?  ?  ?  ?TODAY'S TREATMENT  ?01/03/22:  ?Pt seen for aquatic therapy today.  Treatment took place in water 3.25-4 ft in depth at the [facility] pool. Temp of water was 91?.  Pt entered/exited the pool via stairs independently, step to pattern,  with single rail. ? ?Warm up of walking forward/backward and sidestepping in chest deep water. ?Pilates plank with arms on bench in water with hip ext x 10 each leg ?Plank with arms on yellow noodle under water x 10 sec, 6 reps ?Squats pushing yellow dumbbell under water x 10; repeated with hip abdct after squat x 10 each ?Walking lunges forward/ backward with yellow dumbbells under water ?Monster walk forward  ?Braiding L/ R with cues for technique ?Sit to/from stand with core engaged/ knee lift prior to standing back up x 20 ?Supine supported float x 4 min for decompression ? ? [place] tape applied in upside down horse shoe to medial/lateral Rt knee and crossing at tibial tuberosity with 20% stretch - to decrease pain, decompress tissue, increase proprioception. Pt given safe removal instructions (verbally; pt verbalized understanding) ? ?Pt requires buoyancy for support and to offload joints with strengthening exercises. Viscosity of the water is needed for resistance of strengthening; water current perturbations provides challenge to standing balance unsupported, requiring increased core activation. ? ? ?Eval Exercises ?Supine Posterior Pelvic Tilt - 2 x daily - 7 x weekly - 2 sets - 10 reps - 2 hold ?Neutral Lumbar Spine  Curl Up - 2 x daily - 7 x weekly - 2 sets - 10 reps - 2 hold ?Seated Lumbar Flexion Stretch - 2 x daily - 7 x weekly - 1 sets - 10 reps - 2 hold ?  ?  ?  ?PATIENT EDUCATION:  ?Education details: expectations of DO

## 2022-01-06 ENCOUNTER — Ambulatory Visit (HOSPITAL_BASED_OUTPATIENT_CLINIC_OR_DEPARTMENT_OTHER): Payer: Medicare HMO | Admitting: Physical Therapy

## 2022-01-06 ENCOUNTER — Encounter (HOSPITAL_BASED_OUTPATIENT_CLINIC_OR_DEPARTMENT_OTHER): Payer: Self-pay | Admitting: Physical Therapy

## 2022-01-06 ENCOUNTER — Other Ambulatory Visit: Payer: Self-pay

## 2022-01-06 DIAGNOSIS — M6281 Muscle weakness (generalized): Secondary | ICD-10-CM

## 2022-01-06 DIAGNOSIS — R262 Difficulty in walking, not elsewhere classified: Secondary | ICD-10-CM | POA: Diagnosis not present

## 2022-01-06 DIAGNOSIS — M545 Low back pain, unspecified: Secondary | ICD-10-CM

## 2022-01-10 ENCOUNTER — Ambulatory Visit (HOSPITAL_BASED_OUTPATIENT_CLINIC_OR_DEPARTMENT_OTHER): Payer: Medicare HMO | Admitting: Physical Therapy

## 2022-01-10 ENCOUNTER — Other Ambulatory Visit: Payer: Self-pay

## 2022-01-10 DIAGNOSIS — R262 Difficulty in walking, not elsewhere classified: Secondary | ICD-10-CM | POA: Diagnosis not present

## 2022-01-10 DIAGNOSIS — M545 Low back pain, unspecified: Secondary | ICD-10-CM | POA: Diagnosis not present

## 2022-01-10 DIAGNOSIS — M6281 Muscle weakness (generalized): Secondary | ICD-10-CM

## 2022-01-10 DIAGNOSIS — Z1231 Encounter for screening mammogram for malignant neoplasm of breast: Secondary | ICD-10-CM | POA: Diagnosis not present

## 2022-01-10 NOTE — Therapy (Addendum)
?OUTPATIENT PHYSICAL THERAPY TREATMENT NOTE ? ? ?Patient Name: Melissa [name] ?MRN: [id number] ?DOB:1966-05-20, 56 y.o., female ?Today's Date: 01/10/2022 ? ?PCP: Vicenta Aly, [place] ?REFERRING PROVIDER: Vicenta Aly, FNP ? ? PT End of Session - 01/10/22 1054   ? ? Visit Number 6   ? Number of Visits 21   ? Date for PT Re-Evaluation 03/22/22   ? PT Start Time 0815   ? PT Stop Time 0900   ? PT Time Calculation (min) 45 min   ? ?  ?  ? ?  ? ? ? ? ? ? ?Past Medical History:  ?Diagnosis Date  ? Allergic rhinitis   ? Anxiety   ? Arthritis   ? Back pain   ? Constipated   ? Depression   ? Diaphragmatic hernia   ? Fatty liver   ? GERD (gastroesophageal reflux disease)   ? Hypertension   ? Hypothyroidism   ? IBS (irritable bowel syndrome)   ? Joint pain   ? Kidney problem   ? Lactose intolerance   ? Morbid obesity ([place])   ? OSA (obstructive sleep apnea)   ? Osteoarthritis   ? Seasonal allergies   ? Tinnitus   ? Vitamin D deficiency   ? ?Past Surgical History:  ?Procedure Laterality Date  ? CARPAL TUNNEL RELEASE    ? rt/lt  ? FINGER ARTHRODESIS  11/08/2011  ? Procedure: ARTHRODESIS FINGER;  Surgeon: Cammie Sickle., MD;  Location: [place];  Service: Orthopedics;  Laterality: Left;  left thumb carpal-metacarpal fusion  ? KNEE ARTHROSCOPY    ? both knees  ? PARTIAL KNEE ARTHROPLASTY  11/05/2012  ? Procedure: UNICOMPARTMENTAL KNEE;  Surgeon: Mauri Pole, MD;  Location: [facility];  Service: Orthopedics;  Laterality: Right;  ? TONSILLECTOMY    ? TOTAL SHOULDER ARTHROPLASTY Right 06/25/2020  ? Procedure: TOTAL SHOULDER ARTHROPLASTY;  Surgeon: Nicholes Stairs, MD;  Location: [facility];  Service: Orthopedics;  Laterality: Right;  2.5 hrs  ? URETER SURGERY    ? 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 WISDOM TOOTH EXTRACTION    ? ?Patient Active Problem List  ? Diagnosis Date Noted  ? Hyperkalemia 08/11/2021  ? Major depressive disorder, recurrent  episode, mild with anxious distress ([place]) 06/21/2021  ? Lumbar radiculopathy 06/01/2021  ? Other hyperlipidemia 06/01/2021  ? Hypothyroidism 11/30/2020  ? Prediabetes 11/30/2020  ? Constipated   ? DDD (degenerative disc disease), cervical 08/09/2018  ? Neck pain 07/21/2018  ? Chronic back pain 04/03/2018  ? Spondylolisthesis 04/03/2018  ? Osteoarthritis of right knee 02/23/2018  ? Osteoarthritis of left knee 02/05/2018  ? Acquired hypothyroidism 12/13/2016  ? History of systemic reaction to bee sting 12/13/2016  ? Atrophy of left kidney 09/10/2015  ? Hydronephrosis of right kidney 09/10/2015  ? Obstructive sleep apnea syndrome, mild 08/05/2014  ? Expected blood loss anemia 11/06/2012  ? Obese 11/06/2012  ? S/P right UKR 11/05/2012  ? Major depression, recurrent, chronic ([place]) 02/08/2011  ? Vitamin D deficiency 04/30/2010  ? Diaphragmatic hernia 09/11/2007  ? ? ?  ?REFERRING PROVIDER: Melina Schools, MD ?  ?REFERRING DIAG: M54.51 (ICD-10-CM) - Vertebrogenic low back pain  ?  ?THERAPY DIAG:  ?Pain, lumbar region - Plan: PT plan of care cert/re-cert ?  ?Muscle weakness (generalized) - Plan: PT plan of care cert/re-cert ?  ?Difficulty walking - Plan: PT plan of care cert/re-cert ?  ?ONSET DATE: 01/2018 ?  ?SUBJECTIVE:                                                                                                                                                                    ?  SUBJECTIVE STATEMENT: ?  Some knee pain no back pain.  ? ?PERTINENT HISTORY:  ?Chronic pain, OA, R partial knee and R shoulder replacements, DDD, depression, vertigo (last 6 months) ?  ?PAIN:  ?Are you having pain? no   ?NPRS scale: 0/10 ?Pain location:  ?Pain orientation:  ?PAIN TYPE:  ?Aggravating factors: ?Relieving factors:  ?  ?PRECAUTIONS: None ?  ?WEIGHT BEARING RESTRICTIONS No ?  ?FALLS:  ?Has patient fallen in last 6 months? No, Number of falls: 0 ?  ?LIVING ENVIRONMENT: ?Lives with: lives alone ?Lives in: House/apartment ?Stairs: Yes; 5  steps to enter ?Has following equipment at home: Single point cane and walking stick, does not use since weight loss ; walking on uneven  ?  ?OCCUPATION: disability; helps with farm animal rescue ?  ?PLOF: Independent with basic ADLs ?  ?PATIENT GOALS : Prolonging having to have back surgery ?  ?  ?OBJECTIVE:  ?  *All objective findings taken at eval unless otherwise noted.  ?DIAGNOSTIC FINDINGS:  ?N/A in Epic ?  ?PATIENT SURVEYS:  ?FOTO unavailable ?  ?SCREENING FOR RED FLAGS: ?Bowel or bladder incontinence: No ?Spinal tumors: No ?Cauda equina syndrome: No ?Compression fracture: No ?  ?  ?COGNITION: ?         Overall cognitive status: Within functional limits for tasks assessed              ?          ?SENSATION: ?         Light touch: Deficits L3 NT with touch  ?          ?POSTURE:  ?Exaggerated lumbar lordosis in standing ?  ?PALPATION: ?Signifcant TTP and hypertonicity of bilateral L3-5 paraspinals ?  ?Joint mobility: lower lumbar facet joints stiff and resting in extension position ?  ?LUMBARAROM/PROM ?  ?A/PROM A/PROM  ?12/22/2021  ?Flexion WFL  ?Extension 75% p!  ?Right lateral flexion WFL  ?Left lateral flexion WFL p!  ?Right rotation WFL  ?Left rotation WFL p!  ? (Blank rows = not tested) ?  ?LE AROM: moderately limited in all planes at the hip on L; R WFL; R knee significantly limited with knee flexion; ext [facility] ?  ?LE MMT: ?  ?MMT Right ?12/22/2021 Left ?12/22/2021  ?Hip flexion 4+/5 4/5  ?Hip extension      ?Hip abduction 4+/5 4/5  ?Hip adduction 4+/5 4+/5  ?Knee flexion 4+/5 4/5  ?Knee extension 4+/5 4/5  ? (Blank rows = not tested) ?  ?LUMBAR SPECIAL TESTS:  ?Straight leg raise test: Positive, Slump test: Positive, and FABER test: Positive ?  ?FUNCTIONAL TESTS:  ?5 times sit to stand: 17.5s ?  ?GAIT: ?Distance walked: 2935ft ?Assistive device utilized: None ?Level of assistance: Complete Independence ?Comments: antalgic, compensated Trendelenburg ?  ?Stairs: R UE support ascend reciprocal; descend R UE support  step to with R leading due to knee pain; bilateral trunk sway with ascend and descent ?  ?  ?  ?TODAY'S TREATMENT  ?01/03/22:  ?Pt seen for aquatic therapy today.  Treatment took place in water 3.25-4 ft in depth at the [facility]. Temp of water was 91?.  Pt entered/exited the pool via stairs independently, step to pattern,  with single rail. ? ?Warm up of walking forward/backward and sidestepping in chest deep water. ?Sit to/from stand with core engaged x10 from 3rd step(bottom) then x 10 from 4th step  ?Pilates plank with arms on bench in water with hip ext x 10 each  leg ?Plank with arms on yellow noodle under water x 30s, 3 reps ?-arm pull down x10 ?Resisted core rotation R/L x10 ea ?Step ups forward R/L x10 bottom step.  Attempted R side step without good toleration. Lx10 ?Monster walk forward x6 widths ?Walking lunges forward/ backward with yellow dumbbells under water x4 widths ?Braiding L/ R ? ?Pt requires buoyancy for support and to offload joints with strengthening exercises. Viscosity of the water is needed for resistance of strengthening; water current perturbations provides challenge to standing balance unsupported, requiring increased core activation. ? ? ?Eval Exercises ?Supine Posterior Pelvic Tilt - 2 x daily - 7 x weekly - 2 sets - 10 reps - 2 hold ?Neutral Lumbar Spine Curl Up - 2 x daily - 7 x weekly - 2 sets - 10 reps - 2 hold ?Seated Lumbar Flexion Stretch - 2 x daily - 7 x weekly - 1 sets - 10 reps - 2 hold ?  ?  ?  ?PATIENT EDUCATION:  ?Education details: expectations of DOMS with increase in difficulty of exercise ?Person educated: Patient ?Education method: Explanation, Demonstration, Tactile cues, Verbal cues, ?Education comprehension: verbalized understanding, returned demonstration, verbal cues required, and tactile cues required ?  ?  ?HOME EXERCISE PROGRAM: ?Access Code: [email or web address] ?URL: [email or web address] ?Date: 12/22/2021 ?Prepared by: Daleen Bo ?   ?ASSESSMENT: ?  ?CLINICAL IMPRESSION: ? Pt reports positive response from taping but declines application today.  She tolerates increased core strengthening exercises today without increase in sx. She is cued thro

## 2022-01-11 ENCOUNTER — Ambulatory Visit (INDEPENDENT_AMBULATORY_CARE_PROVIDER_SITE_OTHER): Payer: Medicare HMO | Admitting: Adult Health

## 2022-01-11 ENCOUNTER — Encounter (INDEPENDENT_AMBULATORY_CARE_PROVIDER_SITE_OTHER): Payer: Self-pay | Admitting: Adult Health

## 2022-01-11 VITALS — BP 108/71 | HR 76 | Temp 98.1°F | Ht 62.0 in | Wt 210.0 lb

## 2022-01-11 DIAGNOSIS — E039 Hypothyroidism, unspecified: Secondary | ICD-10-CM

## 2022-01-11 DIAGNOSIS — E8881 Metabolic syndrome: Secondary | ICD-10-CM | POA: Diagnosis not present

## 2022-01-11 DIAGNOSIS — Z6838 Body mass index (BMI) 38.0-38.9, adult: Secondary | ICD-10-CM

## 2022-01-11 DIAGNOSIS — R0602 Shortness of breath: Secondary | ICD-10-CM | POA: Insufficient documentation

## 2022-01-11 DIAGNOSIS — E559 Vitamin D deficiency, unspecified: Secondary | ICD-10-CM

## 2022-01-11 DIAGNOSIS — E7849 Other hyperlipidemia: Secondary | ICD-10-CM | POA: Diagnosis not present

## 2022-01-11 DIAGNOSIS — E669 Obesity, unspecified: Secondary | ICD-10-CM

## 2022-01-11 DIAGNOSIS — E875 Hyperkalemia: Secondary | ICD-10-CM | POA: Diagnosis not present

## 2022-01-11 NOTE — Progress Notes (Signed)
? ? ? ?Chief Complaint:  ? ?OBESITY ?Leshae is here to discuss her progress with her obesity treatment plan along with follow-up of her obesity related diagnoses. Sherrilynn is on the Category 2 Plan and states she is following her eating plan approximately 80% of the time. Preet states she is going to the gym for 60 minutes 4 times per week. ? ?Today's visit was #: 41 ?Starting weight: 256 lbs ?Starting date: 11/18/2019 ?Today's weight: 210 lbs ?Today's date: 01/11/2022 ?Total lbs lost to date: 46 lbs ?Total lbs lost since last in-office visit: 2 lbs ? ?Interim History:  ?On 11/18/2019, RMR 1539 ?On 01/11/2021, RMR 1800. ?She has increased her metabolism by 261! ? ?Subjective:  ? ?1. SOB (shortness of breath) ?She reports dyspnea with extreme exertion, denies CP. ?On 11/18/2019, RMR 1539 ?On 01/11/2021, RMR 1800. Vo2 261 ml/min ?She has increased her metabolism by 261! ? ?2. Hyperkalemia ?No palpitations/CP. ? ?3. Insulin resistance ?She is on metformin 500 mg - 2 tablets at breakfast. ?She denies GI upset. ?Rybelsus caused significant increase in GERD symptoms/low appetite. ? ?4. Other hyperlipidemia ?She is on pravastatin 80 mg daily - managed by PCP. ?She denies myalgias. ? ?5. Vitamin D deficiency ?She is not on any vitamin D3 supplement. ? ?6. Hypothyroidism, unspecified type ?Max dose of levothyroxine 200 mcg - been slowly decreasing dose. ?Currently on levothyroxine 125 mcg. ? ?Assessment/Plan:  ? ?1. SOB (shortness of breath) ?Check IC. ? ?2. Hyperkalemia ?Check labs today. ? ?- Comprehensive metabolic panel ? ?3. Insulin resistance ?Check labs today. ? ?- Hemoglobin A1c ?- Insulin, random ?- Vitamin B12 ? ?4. Other hyperlipidemia ?Will check labs today. ? ?- Lipid panel ? ?5. Vitamin D deficiency ?Check vitamin D level today. ? ?- VITAMIN D 25 Hydroxy (Vit-D Deficiency, Fractures) ? ?6. Hypothyroidism, unspecified type ?Follow-up with PCP. ? ?7. Obesity, current BMI 38.4 ? ?Krystall is currently in the action stage  of change. As such, her goal is to continue with weight loss efforts. She has agreed to the Category 3 Plan with 200 snack calories.  ? ?Convert from Category 2 to Category 3 with 200 snack calories. ? ?Exercise goals:  As is. ? ?Behavioral modification strategies: increasing lean protein intake, decreasing simple carbohydrates, meal planning and cooking strategies, keeping healthy foods in the home, and planning for success. ? ?Adileni has agreed to follow-up with our clinic in 2 weeks. She was informed of the importance of frequent follow-up visits to maximize her success with intensive lifestyle modifications for her multiple health conditions.  ? ?Tawny was informed we would discuss her lab results at her next visit unless there is a critical issue that needs to be addressed sooner. Naveena agreed to keep her next visit at the agreed upon time to discuss these results. ? ?Objective:  ? ?Blood pressure 108/71, pulse 76, temperature 98.1 ?F (36.7 ?C), height 5\' 2"  (1.575 m), weight 210 lb (95.3 kg), SpO2 99 %. ?Body mass index is 38.41 kg/m?. ? ?General: Cooperative, alert, well developed, in no acute distress. ?HEENT: Conjunctivae and lids unremarkable. ?Cardiovascular: Regular rhythm.  ?Lungs: Normal work of breathing. ?Neurologic: No focal deficits.  ? ?Lab Results  ?Component Value Date  ? CREATININE 0.96 12/01/2021  ? BUN 20 12/01/2021  ? NA 141 12/01/2021  ? K 5.0 12/01/2021  ? CL 101 12/01/2021  ? CO2 20 12/01/2021  ? ?Lab Results  ?Component Value Date  ? ALT 21 12/01/2021  ? AST 26 12/01/2021  ? ALKPHOS 93 12/01/2021  ?  BILITOT 0.3 12/01/2021  ? ?Lab Results  ?Component Value Date  ? HGBA1C 5.6 12/01/2021  ? HGBA1C 5.7 (H) 07/15/2021  ? HGBA1C 5.7 (H) 01/13/2021  ? HGBA1C 5.5 09/22/2020  ? HGBA1C 5.9 (H) 06/18/2020  ? ?Lab Results  ?Component Value Date  ? INSULIN 12.5 12/01/2021  ? INSULIN 8.8 07/15/2021  ? INSULIN 15.6 01/13/2021  ? INSULIN 10.7 09/22/2020  ? INSULIN 12.9 06/18/2020  ? ?Lab Results   ?Component Value Date  ? TSH 1.580 12/01/2021  ? ?Lab Results  ?Component Value Date  ? CHOL 195 05/06/2021  ? HDL 68 05/06/2021  ? LDLCALC 112 (H) 05/06/2021  ? TRIG 82 05/06/2021  ? CHOLHDL 2.9 05/06/2021  ? ?Lab Results  ?Component Value Date  ? VD25OH 102.0 (H) 12/01/2021  ? VD25OH 77.1 05/06/2021  ? VD25OH 69.7 01/13/2021  ? ?Lab Results  ?Component Value Date  ? WBC 8.1 06/19/2020  ? HGB 13.2 06/19/2020  ? HCT 42.4 06/19/2020  ? MCV 88.3 06/19/2020  ? PLT 374 06/19/2020  ? ?Obesity Behavioral Intervention:  ? ?Approximately 15 minutes were spent on the discussion below. ? ?ASK: ?We discussed the diagnosis of obesity with [name] today and Rhiannon agreed to give [name] permission to discuss obesity behavioral modification therapy today. ? ?ASSESS: ?Kasaundra has the diagnosis of obesity and her BMI today is 38.4. Krystyn is in the action stage of change.  ? ?ADVISE: ?Hetty was educated on the multiple health risks of obesity as well as the benefit of weight loss to improve her health. She was advised of the need for long term treatment and the importance of lifestyle modifications to improve her current health and to decrease her risk of future health problems. ? ?AGREE: ?Multiple dietary modification options and treatment options were discussed and Maytee agreed to follow the recommendations documented in the above note. ? ?ARRANGE: ?Sonnia was educated on the importance of frequent visits to treat obesity as outlined per CMS and USPSTF guidelines and agreed to schedule her next follow up appointment today. ? ?Attestation Statements:  ? ?Reviewed by clinician on day of visit: allergies, medications, problem list, medical history, surgical history, family history, social history, and previous encounter notes. ? ?I, Water quality scientist, CMA, am acting as Location manager for Mina Marble, NP. ? ?I have reviewed the above documentation for accuracy and completeness, and I agree with the above. -  Tajon Moring d. Wynee Matarazzo, NP-C ?

## 2022-01-11 NOTE — Therapy (Signed)
?OUTPATIENT PHYSICAL THERAPY TREATMENT NOTE ? ? ?Patient Name: Melissa [name] ?MRN: [id number] ?DOB:Aug 13, 1966, 56 y.o., female ?Today's Date: 01/12/2022 ? ?PCP: Elizabeth Palau, FNP ?REFERRING PROVIDER: Elizabeth Palau, FNP ? ? PT End of Session - 01/12/22 0759   ? ? Visit Number 7   ? Number of Visits 21   ? Date for PT Re-Evaluation 03/22/22   ? Authorization Type Aetna MCR   ? PT Start Time [id number]   ? PT Stop Time [phone]   ? PT Time Calculation (min) 48 min   ? Activity Tolerance Patient tolerated treatment well   ? Behavior During Therapy [facility] for tasks assessed/performed   ? ?  ?  ? ?  ? ? ? ? ? ? ? ?Past Medical History:  ?Diagnosis Date  ? Allergic rhinitis   ? Anxiety   ? Arthritis   ? Back pain   ? Constipated   ? Depression   ? Diaphragmatic hernia   ? Fatty liver   ? GERD (gastroesophageal reflux disease)   ? Hypertension   ? Hypothyroidism   ? IBS (irritable bowel syndrome)   ? Joint pain   ? Kidney problem   ? Lactose intolerance   ? Morbid obesity (HCC)   ? OSA (obstructive sleep apnea)   ? Osteoarthritis   ? Seasonal allergies   ? Tinnitus   ? Vitamin D deficiency   ? ?Past Surgical History:  ?Procedure Laterality Date  ? CARPAL TUNNEL RELEASE    ? rt/lt  ? FINGER ARTHRODESIS  11/08/2011  ? Procedure: ARTHRODESIS FINGER;  Surgeon: Wyn Forster., MD;  Location: [facility];  Service: Orthopedics;  Laterality: Left;  left thumb carpal-metacarpal fusion  ? KNEE ARTHROSCOPY    ? both knees  ? PARTIAL KNEE ARTHROPLASTY  11/05/2012  ? Procedure: UNICOMPARTMENTAL KNEE;  Surgeon: [name], MD;  Location: [facility];  Service: Orthopedics;  Laterality: Right;  ? TONSILLECTOMY    ? TOTAL SHOULDER ARTHROPLASTY Right 06/25/2020  ? Procedure: TOTAL SHOULDER ARTHROPLASTY;  Surgeon: Yolonda Kida, MD;  Location: [facility];  Service: Orthopedics;  Laterality: Right;  2.5 hrs  ? URETER SURGERY    ? 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  WISDOM TOOTH EXTRACTION    ? ?Patient Active Problem List  ? Diagnosis Date Noted  ? Insulin resistance 01/11/2022  ? SOB (shortness of breath) 01/11/2022  ? Hyperkalemia 08/11/2021  ? Major depressive disorder, recurrent episode, mild with anxious distress (HCC) 06/21/2021  ? Lumbar radiculopathy 06/01/2021  ? Other hyperlipidemia 06/01/2021  ? Hypothyroidism 11/30/2020  ? Prediabetes 11/30/2020  ? Constipated   ? DDD (degenerative disc disease), cervical 08/09/2018  ? Neck pain 07/21/2018  ? Chronic back pain 04/03/2018  ? Spondylolisthesis 04/03/2018  ? Osteoarthritis of right knee 02/23/2018  ? Osteoarthritis of left knee 02/05/2018  ? Acquired hypothyroidism 12/13/2016  ? History of systemic reaction to bee sting 12/13/2016  ? Atrophy of left kidney 09/10/2015  ? Hydronephrosis of right kidney 09/10/2015  ? Obstructive sleep apnea syndrome, mild 08/05/2014  ? Expected blood loss anemia 11/06/2012  ? Obese 11/06/2012  ? S/P right UKR 11/05/2012  ? Major depression, recurrent, chronic (HCC) 02/08/2011  ? Vitamin D deficiency 04/30/2010  ? Diaphragmatic hernia 09/11/2007  ? ? ?  ?REFERRING PROVIDER: Venita Lick, MD ?  ?REFERRING DIAG: M54.51 (ICD-10-CM) - Vertebrogenic low back pain  ?  ?THERAPY DIAG:  ?Pain, lumbar region - Plan: PT plan  of care cert/re-cert ?  ?Muscle weakness (generalized) - Plan: PT plan of care cert/re-cert ?  ?Difficulty walking - Plan: PT plan of care cert/re-cert ?  ?ONSET DATE: 01/2018 ?  ?SUBJECTIVE:                                                                                                                                                                    ?SUBJECTIVE STATEMENT: ?Pt reports her pain depends on the day.  Overall, she states her metabolism has improved (lost a couple #'s) per [employer].  She is avoiding certain machines at gym and this has lessened the back pain.  The kinesiology tape wasn't as helpful the second time round so she is unsure if she'd like to  have knee taped again.   "I'm still going to have to have (back) surgery." ? ?PERTINENT HISTORY:  ?Chronic pain, OA, R partial knee and R shoulder replacements, DDD, depression, vertigo (last 6 months) ?  ?PAIN:  ?Are you having pain? no   ?NPRS scale: 0/10 ?Pain location:  ?Pain orientation:  ?PAIN TYPE:  ?Aggravating factors: ?Relieving factors:  ?  ?PRECAUTIONS: None ?  ?WEIGHT BEARING RESTRICTIONS No ?  ?FALLS:  ?Has patient fallen in last 6 months? No, Number of falls: 0 ?  ?LIVING ENVIRONMENT: ?Lives with: lives alone ?Lives in: House/apartment ?Stairs: Yes; 5 steps to enter ?Has following equipment at home: Single point cane and walking stick, does not use since weight loss ; walking on uneven  ?  ?OCCUPATION: disability; helps with farm animal rescue ?  ?PLOF: Independent with basic ADLs ?  ?PATIENT GOALS : Prolonging having to have back surgery ?  ?  ?OBJECTIVE:  ?  *All objective findings taken at eval unless otherwise noted.  ?DIAGNOSTIC FINDINGS:  ?N/A in Epic ?  ?PATIENT SURVEYS:  ?FOTO unavailable ?  ?SCREENING FOR RED FLAGS: ?Bowel or bladder incontinence: No ?Spinal tumors: No ?Cauda equina syndrome: No ?Compression fracture: No ?  ?  ?COGNITION: ?         Overall cognitive status: Within functional limits for tasks assessed              ?          ?SENSATION: ?         Light touch: Deficits L3 NT with touch  ?          ?POSTURE:  ?Exaggerated lumbar lordosis in standing ?  ?PALPATION: ?Signifcant TTP and hypertonicity of bilateral L3-5 paraspinals ?  ?Joint mobility: lower lumbar facet joints stiff and resting in extension position ?  ?LUMBARAROM/PROM ?  ?A/PROM A/PROM  ?12/22/2021  ?Flexion WFL  ?Extension 75% p!  ?Right lateral flexion WFL  ?Left lateral flexion WFL p!  ?Right rotation  WFL  ?Left rotation WFL p!  ? (Blank rows = not tested) ?  ?LE AROM: moderately limited in all planes at the hip on L; R WFL; R knee significantly limited with knee flexion; ext [facility] ?  ?LE MMT: ?  ?MMT Right ?12/22/2021  Left ?12/22/2021  ?Hip flexion 4+/5 4/5  ?Hip extension      ?Hip abduction 4+/5 4/5  ?Hip adduction 4+/5 4+/5  ?Knee flexion 4+/5 4/5  ?Knee extension 4+/5 4/5  ? (Blank rows = not tested) ?  ?LUMBAR SPECIAL TESTS:  ?Straight leg raise test: Positive, Slump test: Positive, and FABER test: Positive ?  ?FUNCTIONAL TESTS:  ?5 times sit to stand: 17.5s ?  ?GAIT: ?Distance walked: 41ft ?Assistive device utilized: None ?Level of assistance: Complete Independence ?Comments: antalgic, compensated Trendelenburg ?  ?Stairs: R UE support ascend reciprocal; descend R UE support step to with R leading due to knee pain; bilateral trunk sway with ascend and descent ?  ?  ?  ?TODAY'S TREATMENT  ?01/12/22:  ?Pt seen for aquatic therapy today.  Treatment took place in water 3.25-4 ft in depth at the [facility] pool. Temp of water was 92?.  Pt entered/exited the pool via stairs independently, step to pattern,  with single rail. ? ?Warm up of walking forward/backward and sidestepping in chest deep water - 3 laps of each ?Braiding L/ R x 3 ?Pilates plank with arms on bench in water with hip ext x 10 each leg ?Opposite arm/leg, arms on bench x 10 each ?Monster walk forward 2 laps, with arms; backward with arms 2 laps ?Sit to/from stand with core engaged x10 from 3rd step(bottom)  ?Squats pushing yellow dumbbell underwater x 15 reps ?Plank with arms on yellow noodle under water x 30s, 3 reps ?-arm pull down x10, 2 ?Resisted core rotation R/L x10 ea, limited range - used kickboard ?Step ups forward R/L x10 bottom step, with high opp knee ?Forward walking lunges with yellow dumbbells under water ?Plank with arms on yellow dumbbells x 30 sec x 2 ? ?Pt requires buoyancy for support and to offload joints with strengthening exercises. Viscosity of the water is needed for resistance of strengthening; water current perturbations provides challenge to standing balance unsupported, requiring increased core activation. ? ? ?Eval  Exercises ?Supine Posterior Pelvic Tilt - 2 x daily - 7 x weekly - 2 sets - 10 reps - 2 hold ? ?Seated Lumbar Flexion Stretch - 2 x daily - 7 x weekly - 1 sets - 10 reps - 2 hold ?  ?  ?  ?PATIENT EDUCATION:  ?[place]

## 2022-01-12 ENCOUNTER — Encounter (INDEPENDENT_AMBULATORY_CARE_PROVIDER_SITE_OTHER): Payer: Self-pay | Admitting: Adult Health

## 2022-01-12 ENCOUNTER — Other Ambulatory Visit: Payer: Self-pay

## 2022-01-12 ENCOUNTER — Encounter (HOSPITAL_BASED_OUTPATIENT_CLINIC_OR_DEPARTMENT_OTHER): Payer: Self-pay | Admitting: Physical Therapy

## 2022-01-12 ENCOUNTER — Ambulatory Visit (HOSPITAL_BASED_OUTPATIENT_CLINIC_OR_DEPARTMENT_OTHER): Payer: Medicare HMO | Admitting: Physical Therapy

## 2022-01-12 DIAGNOSIS — R262 Difficulty in walking, not elsewhere classified: Secondary | ICD-10-CM | POA: Diagnosis not present

## 2022-01-12 DIAGNOSIS — M6281 Muscle weakness (generalized): Secondary | ICD-10-CM | POA: Diagnosis not present

## 2022-01-12 DIAGNOSIS — M545 Low back pain, unspecified: Secondary | ICD-10-CM

## 2022-01-12 LAB — LIPID PANEL
Chol/HDL Ratio: 2.4 ratio (ref 0.0–4.4)
Cholesterol, Total: 207 mg/dL — ABNORMAL HIGH (ref 100–199)
HDL: 86 mg/dL (ref >39)
LDL Chol Calc (NIH): 109 mg/dL — ABNORMAL HIGH (ref 0–99)
Triglycerides: 69 mg/dL (ref 0–149)
VLDL Cholesterol Cal: 12 mg/dL (ref 5–40)

## 2022-01-12 LAB — COMPREHENSIVE METABOLIC PANEL
ALT: 19 IU/L (ref 0–32)
AST: 16 IU/L (ref 0–40)
Albumin/Globulin Ratio: 2 (ref 1.2–2.2)
Albumin: 4.9 g/dL (ref 3.8–4.9)
Alkaline Phosphatase: 115 IU/L (ref 44–121)
BUN/Creatinine Ratio: 20 (ref 9–23)
BUN: 18 mg/dL (ref 6–24)
Bilirubin Total: 0.2 mg/dL (ref 0.0–1.2)
CO2: 24 mmol/L (ref 20–29)
Calcium: 10.3 mg/dL — ABNORMAL HIGH (ref 8.7–10.2)
Chloride: 103 mmol/L (ref 96–106)
Creatinine, Ser: 0.91 mg/dL (ref 0.57–1.00)
Globulin, Total: 2.5 g/dL (ref 1.5–4.5)
Glucose: 101 mg/dL — ABNORMAL HIGH (ref 70–99)
Potassium: 5.6 mmol/L — ABNORMAL HIGH (ref 3.5–5.2)
Sodium: 143 mmol/L (ref 134–144)
Total Protein: 7.4 g/dL (ref 6.0–8.5)
eGFR: 73 mL/min/{1.73_m2} (ref >59)

## 2022-01-12 LAB — INSULIN, RANDOM: INSULIN: 8.3 u[IU]/mL (ref 2.6–24.9)

## 2022-01-12 LAB — VITAMIN D 25 HYDROXY (VIT D DEFICIENCY, FRACTURES): Vit D, 25-Hydroxy: 71.2 ng/mL (ref 30.0–100.0)

## 2022-01-12 LAB — VITAMIN B12: Vitamin B-12: 1031 pg/mL (ref 232–1245)

## 2022-01-12 LAB — HEMOGLOBIN A1C
Est. average glucose Bld gHb Est-mCnc: 117 mg/dL
Hgb A1c MFr Bld: 5.7 % — ABNORMAL HIGH (ref 4.8–5.6)

## 2022-01-18 ENCOUNTER — Other Ambulatory Visit: Payer: Self-pay

## 2022-01-18 ENCOUNTER — Encounter (HOSPITAL_BASED_OUTPATIENT_CLINIC_OR_DEPARTMENT_OTHER): Payer: Self-pay | Admitting: Physical Therapy

## 2022-01-18 ENCOUNTER — Ambulatory Visit (HOSPITAL_BASED_OUTPATIENT_CLINIC_OR_DEPARTMENT_OTHER): Payer: Medicare HMO | Admitting: Physical Therapy

## 2022-01-18 DIAGNOSIS — R262 Difficulty in walking, not elsewhere classified: Secondary | ICD-10-CM

## 2022-01-18 DIAGNOSIS — M545 Low back pain, unspecified: Secondary | ICD-10-CM | POA: Diagnosis not present

## 2022-01-18 DIAGNOSIS — M6281 Muscle weakness (generalized): Secondary | ICD-10-CM

## 2022-01-18 NOTE — Therapy (Signed)
?OUTPATIENT PHYSICAL THERAPY TREATMENT NOTE ? ? ?Patient Name: Melissa [name] ?MRN: [id number] ?DOB:[birth year], 56 y.o., female ?Today's Date: 01/18/2022 ? ?PCP: Elizabeth Palau, FNP ?REFERRING PROVIDER: Elizabeth Palau, FNP ? ? PT End of Session - 01/18/22 0723   ? ? Visit Number 8   ? Number of Visits 21   ? Date for PT Re-Evaluation 03/22/22   ? Authorization Type Aetna MCR   ? PT Start Time 0715   ? PT Stop Time 0800   ? PT Time Calculation (min) 45 min   ? Activity Tolerance Patient tolerated treatment well   ? Behavior During Therapy [facility] for tasks assessed/performed   ? ?  ?  ? ?  ? ? ? ? ? ? ? ?Past Medical History:  ?Diagnosis Date  ? Allergic rhinitis   ? Anxiety   ? Arthritis   ? Back pain   ? Constipated   ? Depression   ? Diaphragmatic hernia   ? Fatty liver   ? GERD (gastroesophageal reflux disease)   ? Hypertension   ? Hypothyroidism   ? IBS (irritable bowel syndrome)   ? Joint pain   ? Kidney problem   ? Lactose intolerance   ? Morbid obesity (HCC)   ? OSA (obstructive sleep apnea)   ? Osteoarthritis   ? Seasonal allergies   ? Tinnitus   ? Vitamin D deficiency   ? ?Past Surgical History:  ?Procedure Laterality Date  ? CARPAL TUNNEL RELEASE    ? rt/lt  ? FINGER ARTHRODESIS  11/08/2011  ? Procedure: ARTHRODESIS FINGER;  Surgeon: Wyn Forster., MD;  Location: [facility];  Service: Orthopedics;  Laterality: Left;  left thumb carpal-metacarpal fusion  ? KNEE ARTHROSCOPY    ? both knees  ? PARTIAL KNEE ARTHROPLASTY  11/05/2012  ? Procedure: UNICOMPARTMENTAL KNEE;  Surgeon: [name], MD;  Location: [facility];  Service: Orthopedics;  Laterality: Right;  ? TONSILLECTOMY    ? TOTAL SHOULDER ARTHROPLASTY Right 06/25/2020  ? Procedure: TOTAL SHOULDER ARTHROPLASTY;  Surgeon: Yolonda Kida, MD;  Location: [facility];  Service: Orthopedics;  Laterality: Right;  2.5 hrs  ? URETER SURGERY    ? 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  WISDOM TOOTH EXTRACTION    ? ?Patient Active Problem List  ? Diagnosis Date Noted  ? Insulin resistance 01/11/2022  ? SOB (shortness of breath) 01/11/2022  ? Hyperkalemia 08/11/2021  ? Major depressive disorder, recurrent episode, mild with anxious distress (HCC) 06/21/2021  ? Lumbar radiculopathy 06/01/2021  ? Other hyperlipidemia 06/01/2021  ? Hypothyroidism 11/30/2020  ? Prediabetes 11/30/2020  ? Constipated   ? DDD (degenerative disc disease), cervical 08/09/2018  ? Neck pain 07/21/2018  ? Chronic back pain 04/03/2018  ? Spondylolisthesis 04/03/2018  ? Osteoarthritis of right knee 02/23/2018  ? Osteoarthritis of left knee 02/05/2018  ? Acquired hypothyroidism 12/13/2016  ? History of systemic reaction to bee sting 12/13/2016  ? Atrophy of left kidney 09/10/2015  ? Hydronephrosis of right kidney 09/10/2015  ? Obstructive sleep apnea syndrome, mild 08/05/2014  ? Expected blood loss anemia 11/06/2012  ? Obese 11/06/2012  ? S/P right UKR 11/05/2012  ? Major depression, recurrent, chronic (HCC) 02/08/2011  ? Vitamin D deficiency 04/30/2010  ? Diaphragmatic hernia 09/11/2007  ? ? ?  ?REFERRING PROVIDER: Venita Lick, MD ?  ?REFERRING DIAG: M54.51 (ICD-10-CM) - Vertebrogenic low back pain  ?  ?THERAPY DIAG:  ?Pain, lumbar region - Plan: PT plan  of care cert/re-cert ?  ?Muscle weakness (generalized) - Plan: PT plan of care cert/re-cert ?  ?Difficulty walking - Plan: PT plan of care cert/re-cert ?  ?ONSET DATE: 01/2018 ?  ?SUBJECTIVE:                                                                                                                                                                    ?SUBJECTIVE STATEMENT: ?Pt reports she woke up with Rt knee pain.  Back has been ok, "depends on the day".   ? ?PERTINENT HISTORY:  ?Chronic pain, OA, R partial knee and R shoulder replacements, DDD, depression, vertigo (last 6 months) ?  ?PAIN:  ?Are you having pain? no   ?NPRS scale: 5/10 ?Pain location: knee ?Pain orientation:  Right ?PAIN TYPE: achey ?Aggravating factors: ?Relieving factors:  ?  ?PRECAUTIONS: None ?  ?WEIGHT BEARING RESTRICTIONS No ?  ?FALLS:  ?Has patient fallen in last 6 months? No, Number of falls: 0 ?  ?LIVING ENVIRONMENT: ?Lives with: lives alone ?Lives in: House/apartment ?Stairs: Yes; 5 steps to enter ?Has following equipment at home: Single point cane and walking stick, does not use since weight loss ; walking on uneven  ?  ?OCCUPATION: disability; helps with farm animal rescue ?  ?PLOF: Independent with basic ADLs ?  ?PATIENT GOALS : Prolonging having to have back surgery ?  ?  ?OBJECTIVE:  ?  *All objective findings taken at eval unless otherwise noted.  ?DIAGNOSTIC FINDINGS:  ?N/A in Epic ?  ?PATIENT SURVEYS:  ?FOTO unavailable ?  ?SCREENING FOR RED FLAGS: ?Bowel or bladder incontinence: No ?Spinal tumors: No ?Cauda equina syndrome: No ?Compression fracture: No ?  ?  ?COGNITION: ?         Overall cognitive status: Within functional limits for tasks assessed              ?          ?SENSATION: ?         Light touch: Deficits L3 NT with touch  ?          ?POSTURE:  ?Exaggerated lumbar lordosis in standing ?  ?PALPATION: ?Signifcant TTP and hypertonicity of bilateral L3-5 paraspinals ?  ?Joint mobility: lower lumbar facet joints stiff and resting in extension position ?  ?LUMBARAROM/PROM ?  ?A/PROM A/PROM  ?12/22/2021  ?Flexion WFL  ?Extension 75% p!  ?Right lateral flexion WFL  ?Left lateral flexion WFL p!  ?Right rotation WFL  ?Left rotation WFL p!  ? (Blank rows = not tested) ?  ?LE AROM: moderately limited in all planes at the hip on L; R WFL; R knee significantly limited with knee flexion; [facility] ?  ?LE MMT: ?  ?MMT Right ?12/22/2021 Left ?12/22/2021  ?Hip flexion  4+/5 4/5  ?Hip extension      ?Hip abduction 4+/5 4/5  ?Hip adduction 4+/5 4+/5  ?Knee flexion 4+/5 4/5  ?Knee extension 4+/5 4/5  ? (Blank rows = not tested) ?  ?LUMBAR SPECIAL TESTS:  ?Straight leg raise test: Positive, Slump test: Positive, and FABER  test: Positive ?  ?FUNCTIONAL TESTS:  ?5 times sit to stand: 17.5s ?  ?GAIT: ?Distance walked: 7ft ?Assistive device utilized: None ?Level of assistance: Complete Independence ?Comments: antalgic, compensated Trendelenburg ?  ?Stairs: R UE support ascend reciprocal; descend R UE support step to with R leading due to knee pain; bilateral trunk sway with ascend and descent ?  ?  ?  ?TODAY'S TREATMENT  ?01/12/22:  ?Pt seen for aquatic therapy today.  Treatment took place in water 3.25-4.8 ft in depth at the [facility] pool. Temp of water was 92?.  Pt entered/exited the pool via stairs independently, step to pattern,  with single rail. ? ?Warm up of walking forward/backward and sidestepping in chest deep water - 3 laps of each ?Braiding L/ R x 3 ?Pilates plank with arms on bench in water with hip ext x 10 each leg ?Opposite arm/leg, arms on bench x 10 each ?Plank with arms on yellow noodle under water x 20s, 3 reps ?Noodle between legs:  bicycle, cross country skii, scissors ?Step ups forward R/L x10 bottom step, with high opp knee (only on LLE; Rt knee painful with high Lt knee) ?Squats pushing yellow dumbbell underwater x 15 reps ?Sit to/from stand with core engaged x10 from 4th step(from bottom)  ?Monster walk forward 2 laps, with arms; backward with arms 4 laps ?-arm pull down x10, 2 ?Resisted core rotation R/L x10 ea, limited range - used kickboard ?Forward lunges with yellow dumbbells under water, alternating LEs x 10 each ?Pendulum swings side to side x 10 with yellow dumbbells ? ?Pt requires buoyancy for support and to offload joints with strengthening exercises. Viscosity of the water is needed for resistance of strengthening; water current perturbations provides challenge to standing balance unsupported, requiring increased core activation. ? ?  ?  ?PATIENT EDUCATION:  ?Education details: expectations of DOMS with increase in difficulty of exercise ?Person educated: Patient ?Education method:  Explanation, Demonstration, Tactile cues, Verbal cues, ?Education comprehension: verbalized understanding, returned demonstration, verbal cues required, and tactile cues required ?  ?  ?HOME EXERCISE PROGRAM: ?

## 2022-01-21 ENCOUNTER — Ambulatory Visit (HOSPITAL_BASED_OUTPATIENT_CLINIC_OR_DEPARTMENT_OTHER): Payer: Medicare HMO | Admitting: Physical Therapy

## 2022-01-21 ENCOUNTER — Encounter (HOSPITAL_BASED_OUTPATIENT_CLINIC_OR_DEPARTMENT_OTHER): Payer: Self-pay | Admitting: Physical Therapy

## 2022-01-21 DIAGNOSIS — R262 Difficulty in walking, not elsewhere classified: Secondary | ICD-10-CM | POA: Diagnosis not present

## 2022-01-21 DIAGNOSIS — M545 Low back pain, unspecified: Secondary | ICD-10-CM | POA: Diagnosis not present

## 2022-01-21 DIAGNOSIS — M6281 Muscle weakness (generalized): Secondary | ICD-10-CM

## 2022-01-21 NOTE — Therapy (Signed)
?OUTPATIENT PHYSICAL THERAPY PROGRESS NOTE ? ?Progress Note ?Reporting Period 12/22/21 to 01/21/22 ? ? ?See note below for Objective Data and Assessment of Progress/Goals.  ? ?  ? ? ?Patient Name: Melissa [name] ?MRN: [id number] ?DOB:[birth year], 56 y.o., female ?Today's Date: 01/21/2022 ? ?PCP: Elizabeth Palau, FNP ?REFERRING PROVIDER: Elizabeth Palau, FNP ? ? PT End of Session - 01/21/22 0947   ? ? Visit Number 9   ? Number of Visits 21   ? Date for PT Re-Evaluation 03/22/22   ? Authorization Type A[employer]na MCR   ? PT Start Time [phone]   ? PT Stop Time 0830   ? PT Time Calculation (min) 27 min   ? Activity Tolerance Patient tolerated treatment well   ? Behavior During Therapy [facility] for tasks assessed/performed   ? ?  ?  ? ?  ? ? ? ? ? ? ? ? ?Past Medical History:  ?Diagnosis Date  ? Allergic rhinitis   ? Anxiety   ? Arthritis   ? Back pain   ? Constipated   ? Depression   ? Diaphragmatic hernia   ? Fatty liver   ? GERD (gastroesophageal reflux disease)   ? Hypertension   ? Hypothyroidism   ? IBS (irritable bowel syndrome)   ? Joint pain   ? Kidney problem   ? Lactose intolerance   ? Morbid obesity (HCC)   ? OSA (obstructive sleep apnea)   ? Osteoarthritis   ? Seasonal allergies   ? Tinnitus   ? Vitamin D deficiency   ? ?Past Surgical History:  ?Procedure Laterality Date  ? CARPAL TUNNEL RELEASE    ? rt/lt  ? FINGER ARTHRODESIS  11/08/2011  ? Procedure: ARTHRODESIS FINGER;  Surgeon: Wyn Forster., MD;  Location: [facility];  Service: Orthopedics;  Laterality: Left;  left thumb carpal-metacarpal fusion  ? KNEE ARTHROSCOPY    ? both knees  ? PARTIAL KNEE ARTHROPLASTY  11/05/2012  ? Procedure: UNICOMPARTMENTAL KNEE;  Surgeon: [name], MD;  Location: [facility];  Service: Orthopedics;  Laterality: Right;  ? TONSILLECTOMY    ? TOTAL SHOULDER ARTHROPLASTY Right 06/25/2020  ? Procedure: TOTAL SHOULDER ARTHROPLASTY;  Surgeon: Yolonda Kida, MD;  Location: [facility];  Service: Orthopedics;  Laterality:  Right;  2.5 hrs  ? URETER SURGERY    ? 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 WISDOM TOOTH EXTRACTION    ? ?Patient Active Problem List  ? Diagnosis Date Noted  ? Insulin resistance 01/11/2022  ? SOB (shortness of breath) 01/11/2022  ? Hyperkalemia 08/11/2021  ? Major depressive disorder, recurrent episode, mild with anxious distress (HCC) 06/21/2021  ? Lumbar radiculopathy 06/01/2021  ? Other hyperlipidemia 06/01/2021  ? Hypothyroidism 11/30/2020  ? Prediabetes 11/30/2020  ? Constipated   ? DDD (degenerative disc disease), cervical 08/09/2018  ? Neck pain 07/21/2018  ? Chronic back pain 04/03/2018  ? Spondylolisthesis 04/03/2018  ? Osteoarthritis of right knee 02/23/2018  ? Osteoarthritis of left knee 02/05/2018  ? Acquired hypothyroidism 12/13/2016  ? History of systemic reaction to bee sting 12/13/2016  ? Atrophy of left kidney 09/10/2015  ? Hydronephrosis of right kidney 09/10/2015  ? Obstructive sleep apnea syndrome, mild 08/05/2014  ? Expected blood loss anemia 11/06/2012  ? Obese 11/06/2012  ? S/P right UKR 11/05/2012  ? Major depression, recurrent, chronic (HCC) 02/08/2011  ? Vitamin D deficiency 04/30/2010  ? Diaphragmatic hernia 09/11/2007  ? ? ?  ?REFERRING PROVIDER: Venita Lick,  MD ?  ?REFERRING DIAG: M54.51 (ICD-10-CM) - Vertebrogenic low back pain  ?  ?THERAPY DIAG:  ?Pain, lumbar region - Plan: PT plan of care cert/re-cert ?  ?Muscle weakness (generalized) - Plan: PT plan of care cert/re-cert ?  ?Difficulty walking - Plan: PT plan of care cert/re-cert ?  ?ONSET DATE: 01/2018 ?  ?SUBJECTIVE:                                                                                                                                                                    ?SUBJECTIVE STATEMENT: ?Pt states she has feeling a little bit better. She still feels that she will still have pain at the end of the day. She feels like she will still need to have surgery on  the knee. She feels that walking has improved. She is still maintaining her strength while at the gym and with therapy. She is now able to 2 miles vs the mile before. Still need to take a a rest break halfway.  ? ?PERTINENT HISTORY:  ?Chronic pain, OA, R partial knee and R shoulder replacements, DDD, depression, vertigo (last 6 months) ?  ?PAIN:  ?Are you having pain? Yes   ?NPRS scale: 5/10 ?Pain location: knee ?Pain orientation: Right ?PAIN TYPE: achey ?Aggravating factors: ?Relieving factors:  ?  ?PRECAUTIONS: None ?  ?WEIGHT BEARING RESTRICTIONS No ?  ?FALLS:  ?Has patient fallen in last 6 months? No, Number of falls: 0 ?  ?LIVING ENVIRONMENT: ?Lives with: lives alone ?Lives in: House/apartment ?Stairs: Yes; 5 steps to enter ?Has following equipment at home: Single point cane and walking stick, does not use since weight loss ; walking on uneven  ?  ?OCCUPATION: disability; helps with farm animal rescue ?  ?PLOF: Independent with basic ADLs ?  ?PATIENT GOALS : Prolonging having to have back surgery ?  ?  ?OBJECTIVE:  ? DIAGNOSTIC FINDINGS:  ?N/A in Epic ?  ?          ?SENSATION: ?         Light touch: Deficits L3 NT with touch  ?          ?POSTURE:  ?Exaggerated lumbar lordosis in standing ?  ?PALPATION: ?Signifcant TTP and hypertonicity of bilateral L3-5 paraspinals ?  ?Joint mobility: lower lumbar facet joints stiff and resting in extension position ?  ?LUMBARAROM/PROM ?  ?A/PROM A/PROM  ?01/21/2022  ?Flexion WFL  ?Extension WFL  ?Right lateral flexion WFL  ?Left lateral flexion WFL  ?Right rotation WFL  ?Left rotation WFL p!  ? (Blank rows = not tested) ?  ?LE AROM: moderately limited in all planes at the hip on L; R WFL; R knee significantly limited with knee flexion; ext [facility] ?  ?LE MMT: ?  ?MMT  Right ?01/21/2022 Left ?01/21/2022  ?Hip flexion 4+/5 4+/5  ?Hip extension      ?Hip abduction 5/5 5/5  ?Hip adduction 5/5 5/5  ?Knee flexion 4+/5 4+/5  ?Knee extension 5/5 5/5  ? (Blank rows = not tested) ?  ?   ?FUNCTIONAL TESTS:  ?5 times sit to stand: 13.5s at PN ? ?17.5s at eval ?  ?GAIT: ?Distance walked: 8035ft ?Assistive device utilized: None ?Level of assistance: Complete Independence ?Comments: antalgic, compensated Trendelenburg ?  ?Stairs: R UE support ascend reciprocal; descend R UE support step to with R leading due to weakness;  ?  ?  ?TODAY'S TREATMENT  ?3/31 ? ?Review of HEP, progression of exercise, exercise resources in the gym, rehab prior to surgery, self pain management strategies ? ?01/12/22:  ?Pt seen for aquatic therapy today.  Treatment took place in water 3.25-4.8 ft in depth at the [facility] pool. Temp of water was 92?.  Pt entered/exited the pool via stairs independently, step to pattern,  with single rail. ? ?Warm up of walking forward/backward and sidestepping in chest deep water - 3 laps of each ?Braiding L/ R x 3 ?Pilates plank with arms on bench in water with hip ext x 10 each leg ?Opposite arm/leg, arms on bench x 10 each ?Plank with arms on yellow noodle under water x 20s, 3 reps ?Noodle between legs:  bicycle, cross country skii, scissors ?Step ups forward R/L x10 bottom step, with high opp knee (only on LLE; Rt knee painful with high Lt knee) ?Squats pushing yellow dumbbell underwater x 15 reps ?Sit to/from stand with core engaged x10 from 4th step(from bottom)  ?Monster walk forward 2 laps, with arms; backward with arms 4 laps ?-arm pull down x10, 2 ?Resisted core rotation R/L x10 ea, limited range - used kickboard ?Forward lunges with yellow dumbbells under water, alternating LEs x 10 each ?Pendulum swings side to side x 10 with yellow dumbbells ? ?Pt requires buoyancy for support and to offload joints with strengthening exercises. Viscosity of the water is needed for resistance of strengthening; water current perturbations provides challenge to standing balance unsupported, requiring increased core activation. ? ?  ?  ?PATIENT EDUCATION:  ?Education details: exam findings,  exercise progression, DOMS expectations, envelope of function, HEP, POC ? ?Person educated: Patient ?Education method: Explanation, Demonstration, Tactile cues, Verbal cues, ?Education comprehension: verbalized underst

## 2022-01-26 ENCOUNTER — Ambulatory Visit (INDEPENDENT_AMBULATORY_CARE_PROVIDER_SITE_OTHER): Payer: Medicare HMO | Admitting: Adult Health

## 2022-01-26 ENCOUNTER — Encounter (INDEPENDENT_AMBULATORY_CARE_PROVIDER_SITE_OTHER): Payer: Self-pay | Admitting: Adult Health

## 2022-01-26 VITALS — BP 108/73 | HR 79 | Temp 98.1°F | Ht 62.0 in | Wt 209.0 lb

## 2022-01-26 DIAGNOSIS — E669 Obesity, unspecified: Secondary | ICD-10-CM

## 2022-01-26 DIAGNOSIS — E8881 Metabolic syndrome: Secondary | ICD-10-CM | POA: Diagnosis not present

## 2022-01-26 DIAGNOSIS — Z6838 Body mass index (BMI) 38.0-38.9, adult: Secondary | ICD-10-CM

## 2022-01-26 DIAGNOSIS — E875 Hyperkalemia: Secondary | ICD-10-CM | POA: Diagnosis not present

## 2022-01-26 DIAGNOSIS — R7303 Prediabetes: Secondary | ICD-10-CM

## 2022-01-26 DIAGNOSIS — E559 Vitamin D deficiency, unspecified: Secondary | ICD-10-CM

## 2022-01-26 DIAGNOSIS — E7849 Other hyperlipidemia: Secondary | ICD-10-CM

## 2022-01-26 MED ORDER — METFORMIN HCL 500 MG PO TABS: ORAL_TABLET | ORAL | 0 refills | Status: DC

## 2022-01-27 DIAGNOSIS — E782 Mixed hyperlipidemia: Secondary | ICD-10-CM | POA: Diagnosis not present

## 2022-01-27 DIAGNOSIS — E039 Hypothyroidism, unspecified: Secondary | ICD-10-CM | POA: Diagnosis not present

## 2022-01-28 DIAGNOSIS — M4316 Spondylolisthesis, lumbar region: Secondary | ICD-10-CM | POA: Diagnosis not present

## 2022-01-28 DIAGNOSIS — M25561 Pain in right knee: Secondary | ICD-10-CM | POA: Diagnosis not present

## 2022-01-28 DIAGNOSIS — M5416 Radiculopathy, lumbar region: Secondary | ICD-10-CM | POA: Diagnosis not present

## 2022-01-28 DIAGNOSIS — M5451 Vertebrogenic low back pain: Secondary | ICD-10-CM | POA: Diagnosis not present

## 2022-02-01 DIAGNOSIS — D689 Coagulation defect, unspecified: Secondary | ICD-10-CM | POA: Diagnosis not present

## 2022-02-01 DIAGNOSIS — M4316 Spondylolisthesis, lumbar region: Secondary | ICD-10-CM | POA: Diagnosis not present

## 2022-02-01 DIAGNOSIS — R7301 Impaired fasting glucose: Secondary | ICD-10-CM | POA: Diagnosis not present

## 2022-02-01 DIAGNOSIS — Z01818 Encounter for other preprocedural examination: Secondary | ICD-10-CM | POA: Diagnosis not present

## 2022-02-03 NOTE — Progress Notes (Signed)
? ? ? ?Chief Complaint:  ? ?OBESITY ?Roselin is here to discuss her progress with her obesity treatment plan along with follow-up of her obesity related diagnoses. Tierany is on the Category 3 Plan with 200 snack calories and states she is following her eating plan approximately 80% of the time. Kenadi states she is going to the gym for 60 minutes 4-5 times per week. ? ?Today's visit was #: 42 ?Starting weight: 256 lbs ?Starting date: 11/18/2019 ?Today's weight: 209 lbs ?Today's date: 01/26/2022 ?Total lbs lost to date: 47 lbs ?Total lbs lost since last in-office visit: 1 lb ? ?Interim History:  ?At last office visit, Tresa's RMR was checked - metabolism increased 260 calories. ?Current RMR 1800. ?Converted to Category 3 meal plan. ? ?Subjective:  ? ?1. Insulin resistance ?Discussed labs with patient today.  ?On 01/11/2022, BG 101, A1c 5.7, insulin 8.3. ?On 01/11/2022, CMP - GFR 73. ? ?2. Hyperkalemia ?Worsening.  Discussed labs with patient today.  ?On 01/11/2022, CMP - potassium 5.6 - no acute cardiac symptoms. ?She is not on any antihypertensives and BP at OV - 108/73 ? ?3. Vitamin D deficiency ?Discussed labs with patient today.  ?On 01/11/2022, vitamin D level - 71.2. ?She has been off OTC vitamin D supplement for awhile. ? ?4. Other hyperlipidemia ?Discussed labs with patient today.  ?On 01/11/2022, EMP - LFTs within normal limits. ?Lipid panel - total 207, LDL 109 - both above goal.  HDL - 86 - wonderful. ?PCP manages pravastatin 80 mg daily. ? ?Assessment/Plan:  ? ?1. Insulin resistance ?Refill metformin 500 mg - 2 tablets at breakfast. ? ?- Refill metFORMIN (GLUCOPHAGE) 500 MG tablet; 2 tabs at breakfast  Dispense: 60 tablet; Refill: 0 ? ?2. Hyperkalemia ?Avoid high potassium foods - MyChart message with information sent to patient. ?If Red Flag sx's develop- seek immediate medical assistance.  Pt verbalized understanding/agreement. ? ?3. Vitamin D deficiency ?Restart OTC vitamin D3 1000 IU daily - 1 capsule  daily. ? ?4. Other hyperlipidemia ?Follow-up with PCP. ? ?5. Obesity, current BMI 38.3 ? ?Deriyah is currently in the action stage of change. As such, her goal is to continue with weight loss efforts. She has agreed to the Category 3 Plan with snack options.  ? ?Exercise goals:  As is. ? ?Behavioral modification strategies: increasing lean protein intake, decreasing simple carbohydrates, keeping healthy foods in the home, ways to avoid boredom eating, and planning for success. ? ?Jarielis has agreed to follow-up with our clinic in 2 weeks. She was informed of the importance of frequent follow-up visits to maximize her success with intensive lifestyle modifications for her multiple health conditions.  ? ?Objective:  ? ?Blood pressure 108/73, pulse 79, temperature 98.1 ?F (36.7 ?C), height 5\' 2"  (1.575 m), weight 209 lb (94.8 kg), SpO2 98 %. ?Body mass index is 38.23 kg/m?. ? ?General: Cooperative, alert, well developed, in no acute distress. ?HEENT: Conjunctivae and lids unremarkable. ?Cardiovascular: Regular rhythm.  ?Lungs: Normal work of breathing. ?Neurologic: No focal deficits.  ? ?Lab Results  ?Component Value Date  ? CREATININE 0.91 01/11/2022  ? BUN 18 01/11/2022  ? NA 143 01/11/2022  ? K 5.6 (H) 01/11/2022  ? CL 103 01/11/2022  ? CO2 24 01/11/2022  ? ?Lab Results  ?Component Value Date  ? ALT 19 01/11/2022  ? AST 16 01/11/2022  ? ALKPHOS 115 01/11/2022  ? BILITOT 0.2 01/11/2022  ? ?Lab Results  ?Component Value Date  ? HGBA1C 5.7 (H) 01/11/2022  ? HGBA1C 5.6 12/01/2021  ?  HGBA1C 5.7 (H) 07/15/2021  ? HGBA1C 5.7 (H) 01/13/2021  ? HGBA1C 5.5 09/22/2020  ? ?Lab Results  ?Component Value Date  ? INSULIN 8.3 01/11/2022  ? INSULIN 12.5 12/01/2021  ? INSULIN 8.8 07/15/2021  ? INSULIN 15.6 01/13/2021  ? INSULIN 10.7 09/22/2020  ? ?Lab Results  ?Component Value Date  ? TSH 1.580 12/01/2021  ? ?Lab Results  ?Component Value Date  ? CHOL 207 (H) 01/11/2022  ? HDL 86 01/11/2022  ? LDLCALC 109 (H) 01/11/2022  ? TRIG 69  01/11/2022  ? CHOLHDL 2.4 01/11/2022  ? ?Lab Results  ?Component Value Date  ? VD25OH 71.2 01/11/2022  ? VD25OH 102.0 (H) 12/01/2021  ? VD25OH 77.1 05/06/2021  ? ?Lab Results  ?Component Value Date  ? WBC 8.1 06/19/2020  ? HGB 13.2 06/19/2020  ? HCT 42.4 06/19/2020  ? MCV 88.3 06/19/2020  ? PLT 374 06/19/2020  ? ?Obesity Behavioral Intervention:  ? ?Approximately 15 minutes were spent on the discussion below. ? ?ASK: ?We discussed the diagnosis of obesity with [name] today and Etsuko agreed to give [name] permission to discuss obesity behavioral modification therapy today. ? ?ASSESS: ?Janelli has the diagnosis of obesity and her BMI today is 38.3. Ailia is in the action stage of change.  ? ?ADVISE: ?Shameya was educated on the multiple health risks of obesity as well as the benefit of weight loss to improve her health. She was advised of the need for long term treatment and the importance of lifestyle modifications to improve her current health and to decrease her risk of future health problems. ? ?AGREE: ?Multiple dietary modification options and treatment options were discussed and Laurabelle agreed to follow the recommendations documented in the above note. ? ?ARRANGE: ?Davon was educated on the importance of frequent visits to treat obesity as outlined per CMS and USPSTF guidelines and agreed to schedule her next follow up appointment today. ? ?Attestation Statements:  ? ?Reviewed by clinician on day of visit: allergies, medications, problem list, medical history, surgical history, family history, social history, and previous encounter notes. ? ?I, Water quality scientist, CMA, am acting as Location manager for Mina Marble, NP. ? ?I have reviewed the above documentation for accuracy and completeness, and I agree with the above. -  Tarrell Debes d. Crosby [name], NP-C ?

## 2022-02-14 ENCOUNTER — Ambulatory Visit (INDEPENDENT_AMBULATORY_CARE_PROVIDER_SITE_OTHER): Payer: Medicare HMO | Admitting: Adult Health

## 2022-02-14 ENCOUNTER — Encounter (INDEPENDENT_AMBULATORY_CARE_PROVIDER_SITE_OTHER): Payer: Self-pay | Admitting: Adult Health

## 2022-02-14 VITALS — BP 121/81 | HR 74 | Temp 98.4°F | Ht 62.0 in | Wt 214.0 lb

## 2022-02-14 DIAGNOSIS — Z6839 Body mass index (BMI) 39.0-39.9, adult: Secondary | ICD-10-CM

## 2022-02-14 DIAGNOSIS — M545 Low back pain, unspecified: Secondary | ICD-10-CM

## 2022-02-14 DIAGNOSIS — E669 Obesity, unspecified: Secondary | ICD-10-CM

## 2022-02-14 DIAGNOSIS — E8881 Metabolic syndrome: Secondary | ICD-10-CM | POA: Diagnosis not present

## 2022-02-14 MED ORDER — METFORMIN HCL 500 MG PO TABS: ORAL_TABLET | ORAL | 0 refills | Status: DC

## 2022-02-17 DIAGNOSIS — M5451 Vertebrogenic low back pain: Secondary | ICD-10-CM | POA: Diagnosis not present

## 2022-02-22 DIAGNOSIS — M5451 Vertebrogenic low back pain: Secondary | ICD-10-CM | POA: Diagnosis not present

## 2022-02-23 NOTE — Progress Notes (Signed)
? ? ? ?Chief Complaint:  ? ?OBESITY ?Dru is here to discuss her progress with her obesity treatment plan along with follow-up of her obesity related diagnoses. Troylene is on the Category 3 Plan and states she is following her eating plan approximately 60% of the time. Patti states she is doing gym exercise for 60 minutes 4 times per week. ? ?Today's visit was #: 62 ?Starting weight: 256 lbs ?Starting date: 11/18/2019 ?Today's weight: 214 lbs ?Today's date: 02/14/2022 ?Total lbs lost to date: 42 lbs ?Total lbs lost since last in-office visit: 0 ? ?Interim History:  ?Camala's blood pressure is soft today- 92/51, recheck manual pressure 121/81 (L arm, seated). ?Epic review demonstrates SBP 90-120 DBP 50-70  ?She denies acute cardiac sx's. ?She took Zanaflex 4 mg at bedtime last night and hydrocodone 5-325 mg this morning for increased chronic back pain. ? ?She estimates to drink 6 times of 16 ounce bottles of water per day. ?Her current BMI is 39.3- at goal for orthopedic surgical procedures. ? ?Subjective:  ? ?1. Insulin resistance ?01/11/2022: A1c 5.7, BG 101, Insulin Level 8.3- all slightly above goal. ?She is on Metformin 500 mg 2 tablets at breakfast. She denies GI side effects.  ? ?2. Lumbar spine pain ?Denim's current back pain was rated 6/10 localized to central lumbar spine pain.  ?She reports pain radiates bilaterally down legs. ?She describes pain as a constant burning/stinging. ?She took Zanaflex 4 mg at bedtime last night and hydrocodone 5-325 mg this morning for increased chronic back pain. ?She denies changes in bowel/bladder habits. ? ?Assessment/Plan:  ? ?1. Insulin resistance ?We will refill Metformin 500 mg for 1 month with no refills. Seleena will continue to work on weight loss, exercise, and decreasing simple carbohydrates to help decrease the risk of diabetes. Kesslyn agreed to follow-up with [name] as directed to closely monitor her progress. ? ?- metFORMIN (GLUCOPHAGE) 500 MG tablet; 2 tabs at  breakfast  Dispense: 60 tablet; Refill: 0 ? ?2. Lumbar spine pain ?Zylpha will follow up with [facility]. Rolena Infante at [facility] today.  ? ?3. Obesity, current BMI 39.3 ?Bernie is currently in the action stage of change. As such, her goal is to continue with weight loss efforts. She has agreed to the Category 3 Plan.  ? ?Exercise goals:  As is. ? ?Behavioral modification strategies: increasing lean protein intake, decreasing simple carbohydrates, meal planning and cooking strategies, keeping healthy foods in the home, and planning for success. ? ?Cina has agreed to follow-up with our clinic in 2 weeks. She was informed of the importance of frequent follow-up visits to maximize her success with intensive lifestyle modifications for her multiple health conditions.  ? ?Objective:  ? ?Blood pressure 121/81, pulse 74, temperature 98.4 ?F (36.9 ?C), height 5\' 2"  (1.575 m), weight 214 lb (97.1 kg), SpO2 99 %. ?Body mass index is 39.14 kg/m?. ? ?General: Cooperative, alert, well developed, in no acute distress. ?HEENT: Conjunctivae and lids unremarkable. ?Cardiovascular: Regular rhythm.  ?Lungs: Normal work of breathing. ?Neurologic: No focal deficits.  ? ?Lab Results  ?Component Value Date  ? CREATININE 0.91 01/11/2022  ? BUN 18 01/11/2022  ? NA 143 01/11/2022  ? K 5.6 (H) 01/11/2022  ? CL 103 01/11/2022  ? CO2 24 01/11/2022  ? ?Lab Results  ?Component Value Date  ? ALT 19 01/11/2022  ? AST 16 01/11/2022  ? ALKPHOS 115 01/11/2022  ? BILITOT 0.2 01/11/2022  ? ?Lab Results  ?Component Value Date  ? HGBA1C 5.7 (H) 01/11/2022  ?  HGBA1C 5.6 12/01/2021  ? HGBA1C 5.7 (H) 07/15/2021  ? HGBA1C 5.7 (H) 01/13/2021  ? HGBA1C 5.5 09/22/2020  ? ?Lab Results  ?Component Value Date  ? INSULIN 8.3 01/11/2022  ? INSULIN 12.5 12/01/2021  ? INSULIN 8.8 07/15/2021  ? INSULIN 15.6 01/13/2021  ? INSULIN 10.7 09/22/2020  ? ?Lab Results  ?Component Value Date  ? TSH 1.580 12/01/2021  ? ?Lab Results  ?Component Value Date  ? CHOL 207 (H) 01/11/2022  ? HDL  86 01/11/2022  ? LDLCALC 109 (H) 01/11/2022  ? TRIG 69 01/11/2022  ? CHOLHDL 2.4 01/11/2022  ? ?Lab Results  ?Component Value Date  ? VD25OH 71.2 01/11/2022  ? VD25OH 102.0 (H) 12/01/2021  ? VD25OH 77.1 05/06/2021  ? ?Lab Results  ?Component Value Date  ? WBC 8.1 06/19/2020  ? HGB 13.2 06/19/2020  ? HCT 42.4 06/19/2020  ? MCV 88.3 06/19/2020  ? PLT 374 06/19/2020  ? ?No results found for: IRON, TIBC, FERRITIN ? ?Obesity Behavioral Intervention:  ? ?Approximately 15 minutes were spent on the discussion below. ? ?ASK: ?We discussed the diagnosis of obesity with [name] today and Bunnie agreed to give [name] permission to discuss obesity behavioral modification therapy today. ? ?ASSESS: ?Vernadine has the diagnosis of obesity and her BMI today is 39.3. Ashayla is in the action stage of change.  ? ?ADVISE: ?Jillann was educated on the multiple health risks of obesity as well as the benefit of weight loss to improve her health. She was advised of the need for long term treatment and the importance of lifestyle modifications to improve her current health and to decrease her risk of future health problems. ? ?AGREE: ?Multiple dietary modification options and treatment options were discussed and Ceres agreed to follow the recommendations documented in the above note. ? ?ARRANGE: ?Kmya was educated on the importance of frequent visits to treat obesity as outlined per CMS and USPSTF guidelines and agreed to schedule her next follow up appointment today. ? ?Attestation Statements:  ? ?Reviewed by clinician on day of visit: allergies, medications, problem list, medical history, surgical history, family history, social history, and previous encounter notes. ? ?I, Lizbeth Bark, RMA, am acting as Location manager for Mina Marble, NP. ? ?I have reviewed the above documentation for accuracy and completeness, and I agree with the above. -  Lubertha Leite d. Kendell Gammon, NP-C ?

## 2022-02-24 DIAGNOSIS — M5451 Vertebrogenic low back pain: Secondary | ICD-10-CM | POA: Diagnosis not present

## 2022-02-28 DIAGNOSIS — M5451 Vertebrogenic low back pain: Secondary | ICD-10-CM | POA: Diagnosis not present

## 2022-03-02 ENCOUNTER — Encounter (INDEPENDENT_AMBULATORY_CARE_PROVIDER_SITE_OTHER): Payer: Self-pay | Admitting: Adult Health

## 2022-03-02 ENCOUNTER — Ambulatory Visit (INDEPENDENT_AMBULATORY_CARE_PROVIDER_SITE_OTHER): Payer: Medicare HMO | Admitting: Adult Health

## 2022-03-02 VITALS — BP 126/74 | HR 83 | Temp 97.9°F | Ht 62.0 in | Wt 214.0 lb

## 2022-03-02 DIAGNOSIS — Z6839 Body mass index (BMI) 39.0-39.9, adult: Secondary | ICD-10-CM

## 2022-03-02 DIAGNOSIS — M545 Low back pain, unspecified: Secondary | ICD-10-CM

## 2022-03-02 DIAGNOSIS — E669 Obesity, unspecified: Secondary | ICD-10-CM | POA: Diagnosis not present

## 2022-03-02 DIAGNOSIS — E559 Vitamin D deficiency, unspecified: Secondary | ICD-10-CM | POA: Diagnosis not present

## 2022-03-02 DIAGNOSIS — M5451 Vertebrogenic low back pain: Secondary | ICD-10-CM | POA: Diagnosis not present

## 2022-03-07 NOTE — Progress Notes (Signed)
? ? ? ?Chief Complaint:  ? ?OBESITY ?Melissa [name] is here to discuss her progress with her obesity treatment plan along with follow-up of her obesity related diagnoses. Melissa [name] is on the Category 3 Plan and states she is following her eating plan approximately 85% of the time. Melissa [name] states she is going to the gym 60-120 minutes 5 times per week. ? ?Today's visit was #: 69 ?Starting weight: 256 lbs ?Starting date: 11/18/2019 ?Today's weight: 214 lbs  ?Today's date: 03/02/2022 ?Total lbs lost to date: 72 ?Total lbs lost since last in-office visit: 0 ? ?Interim History:  ?Insurance has denied coverage for back surgery.  ?Thus far she has completed 4 weeks of outpatient physical therapy.  ?Insurance requires that she complete 4 more weeks of PT, to equal total of 8 weeks of therapy. ? ?Current wt 214 lbs/BMI 39.2.  ? ?Subjective:  ? ?1. Lumbar spine pain ?Insurance has denied coverage for back surgery.  ?Thus far she has completed 4 weeks of outpatient physical therapy.  ?Insurance requires that she complete 4 more weeks of PT, to equal total of 8 weeks of therapy. ? ?2. Vitamin D deficiency ?On 01/11/22 Vit D level at 71.2- therapeutic.  ?She is currently taking over the counter Vit D3, 1,000 IU daily. ? ?Assessment/Plan:  ? ?1. Lumbar spine pain ?Melissa [name] will complete required course of physical therapy per insurance. She will follow up with Dr. Coralee Pesa Surgeon. ? ?2. Vitamin D deficiency ?Will check Vit D level at next office visit.  ?She will continue on over the counter Vit D3 supplement. ? ?3. Obesity, current BMI 39.2 ?Melissa [name] is currently in the action stage of change. As such, her goal is to continue with weight loss efforts. She has agreed to the Category 3 Plan.  ? ?Exercise goals: As is. ? ?Behavioral modification strategies: increasing lean protein intake, decreasing simple carbohydrates, meal planning and cooking strategies, keeping healthy foods in the home, and planning for success. ? ?Melissa [name] has agreed  to follow-up with our clinic in 4 weeks. She was informed of the importance of frequent follow-up visits to maximize her success with intensive lifestyle modifications for her multiple health conditions.  ? ?Objective:  ? ?Blood pressure 126/74, pulse 83, temperature 97.9 ?F (36.6 ?C), height 5\' 2"  (1.575 m), weight 214 lb (97.1 kg), SpO2 99 %. ?Body mass index is 39.14 kg/m?. ? ?General: Cooperative, alert, well developed, in no acute distress. ?HEENT: Conjunctivae and lids unremarkable. ?Cardiovascular: Regular rhythm.  ?Lungs: Normal work of breathing. ?Neurologic: No focal deficits.  ? ?Lab Results  ?Component Value Date  ? CREATININE 0.91 01/11/2022  ? BUN 18 01/11/2022  ? NA 143 01/11/2022  ? K 5.6 (H) 01/11/2022  ? CL 103 01/11/2022  ? CO2 24 01/11/2022  ? ?Lab Results  ?Component Value Date  ? ALT 19 01/11/2022  ? AST 16 01/11/2022  ? ALKPHOS 115 01/11/2022  ? BILITOT 0.2 01/11/2022  ? ?Lab Results  ?Component Value Date  ? HGBA1C 5.7 (H) 01/11/2022  ? HGBA1C 5.6 12/01/2021  ? HGBA1C 5.7 (H) 07/15/2021  ? HGBA1C 5.7 (H) 01/13/2021  ? HGBA1C 5.5 09/22/2020  ? ?Lab Results  ?Component Value Date  ? INSULIN 8.3 01/11/2022  ? INSULIN 12.5 12/01/2021  ? INSULIN 8.8 07/15/2021  ? INSULIN 15.6 01/13/2021  ? INSULIN 10.7 09/22/2020  ? ?Lab Results  ?Component Value Date  ? TSH 1.580 12/01/2021  ? ?Lab Results  ?Component Value Date  ? CHOL 207 (H) 01/11/2022  ? HDL 86  01/11/2022  ? LDLCALC 109 (H) 01/11/2022  ? TRIG 69 01/11/2022  ? CHOLHDL 2.4 01/11/2022  ? ?Lab Results  ?Component Value Date  ? VD25OH 71.2 01/11/2022  ? VD25OH 102.0 (H) 12/01/2021  ? VD25OH 77.1 05/06/2021  ? ?Lab Results  ?Component Value Date  ? WBC 8.1 06/19/2020  ? HGB 13.2 06/19/2020  ? HCT 42.4 06/19/2020  ? MCV 88.3 06/19/2020  ? PLT 374 06/19/2020  ? ?No results found for: IRON, TIBC, FERRITIN ? ?Obesity Behavioral Intervention:  ? ? ?Attestation Statements:  ? ?Reviewed by clinician on day of visit: allergies, medications, problem list,  medical history, surgical history, family history, social history, and previous encounter notes. ? ?Time spent on visit including pre-visit chart review and post-visit care and charting was 27 minutes.  ? ?I, Brendell Tyus, RMA, am acting as transcriptionist for Mina Marble, NP. ? ?I have reviewed the above documentation for accuracy and completeness, and I agree with the above. -  Christne Platts d. Komal Stangelo, NP-C ?

## 2022-03-08 DIAGNOSIS — M545 Low back pain, unspecified: Secondary | ICD-10-CM | POA: Insufficient documentation

## 2022-03-08 DIAGNOSIS — M5451 Vertebrogenic low back pain: Secondary | ICD-10-CM | POA: Diagnosis not present

## 2022-03-10 DIAGNOSIS — M5451 Vertebrogenic low back pain: Secondary | ICD-10-CM | POA: Diagnosis not present

## 2022-03-14 DIAGNOSIS — M5451 Vertebrogenic low back pain: Secondary | ICD-10-CM | POA: Diagnosis not present

## 2022-03-16 ENCOUNTER — Encounter (INDEPENDENT_AMBULATORY_CARE_PROVIDER_SITE_OTHER): Payer: Self-pay | Admitting: Adult Health

## 2022-03-16 ENCOUNTER — Ambulatory Visit (INDEPENDENT_AMBULATORY_CARE_PROVIDER_SITE_OTHER): Payer: Medicare HMO | Admitting: Adult Health

## 2022-03-16 VITALS — BP 110/71 | HR 73 | Temp 99.0°F | Ht 62.0 in | Wt 216.0 lb

## 2022-03-16 DIAGNOSIS — E669 Obesity, unspecified: Secondary | ICD-10-CM

## 2022-03-16 DIAGNOSIS — M1711 Unilateral primary osteoarthritis, right knee: Secondary | ICD-10-CM | POA: Diagnosis not present

## 2022-03-16 DIAGNOSIS — E88819 Insulin resistance, unspecified: Secondary | ICD-10-CM

## 2022-03-16 DIAGNOSIS — E66813 Obesity, class 3: Secondary | ICD-10-CM

## 2022-03-16 DIAGNOSIS — Z6839 Body mass index (BMI) 39.0-39.9, adult: Secondary | ICD-10-CM | POA: Diagnosis not present

## 2022-03-16 DIAGNOSIS — E8881 Metabolic syndrome: Secondary | ICD-10-CM | POA: Diagnosis not present

## 2022-03-16 MED ORDER — WEGOVY 0.25 MG/0.5ML ~~LOC~~ SOAJ: 0.2500 mg | SUBCUTANEOUS | 0 refills | Status: DC

## 2022-03-16 MED ORDER — METFORMIN HCL 500 MG PO TABS: ORAL_TABLET | ORAL | 0 refills | Status: DC

## 2022-03-17 DIAGNOSIS — M5451 Vertebrogenic low back pain: Secondary | ICD-10-CM | POA: Diagnosis not present

## 2022-03-18 DIAGNOSIS — M4316 Spondylolisthesis, lumbar region: Secondary | ICD-10-CM | POA: Diagnosis not present

## 2022-03-18 DIAGNOSIS — M5451 Vertebrogenic low back pain: Secondary | ICD-10-CM | POA: Diagnosis not present

## 2022-03-18 NOTE — Progress Notes (Unsigned)
Chief Complaint:   OBESITY Melissa [name] is here to discuss her progress with her obesity treatment plan along with follow-up of her obesity related diagnoses. [name] is on the Category 3 Plan and states she is following her eating plan approximately 85% of the time. [name] states she is walking and at the gym for 60 minutes 5 times per week.  Today's visit was #: 60 Starting weight: 256 lbs Starting date: 11/18/2019 Today's weight: 216 lbs Today's date: 03/16/2022 Total lbs lost to date: 40 lbs Total lbs lost since last in-office visit: 0  Interim History: Today she received a "gel shot", 13 shot series right knee.  Followed by *** office visit. Dr Brooks/Bark/ Dr Marland Kitchen ( knees she will complete 8 weeks of PT as required by her insurance to cover ***.  Subjective:   1. Insulin resistance Persistent prediabetes despite Metformin 500 mg 2 tablets at breakfast.  She denies family history of MTC or *** history of MEM, pancreatitis.  Assessment/Plan:   1. Insulin resistance Start Wegovy *** Refill Metformin 500 mg 2 tablets at breakfast.  Dispense 60 tablets, no refills.   See below.   - metFORMIN (GLUCOPHAGE) 500 MG tablet; 2 tabs at breakfast  Dispense: 60 tablet; Refill: 0  2. Obesity, current BMI 39.7 Start Wegovy 0.25 mg once a week.  Dispense 2 mL no refills.   - Semaglutide-Weight Management (WEGOVY) 0.25 MG/0.5ML SOAJ; Inject 0.25 mg into the skin once a week.  Dispense: 2 mL; Refill: 0  [name] is currently in the action stage of change. As such, her goal is to continue with weight loss efforts. She has agreed to the Category 3 Plan.   Exercise goals:  As is.   Behavioral modification strategies: increasing lean protein intake, decreasing simple carbohydrates, meal planning and cooking strategies, keeping healthy foods in the home, and planning for success.  Cortlyn has agreed to follow-up with our clinic in 3 weeks. She was informed of the importance of frequent follow-up  visits to maximize her success with intensive lifestyle modifications for her multiple health conditions.   Ethna was informed we would discuss her lab results at her next visit unless there is a critical issue that needs to be addressed sooner. Shauntea agreed to keep her next visit at the agreed upon time to discuss these results.  Objective:   Blood pressure 110/71, pulse 73, temperature 99 F (37.2 C), height 5\' 2"  (1.575 m), weight 216 lb (98 kg), SpO2 99 %. Body mass index is 39.51 kg/m.  General: Cooperative, alert, well developed, in no acute distress. HEENT: Conjunctivae and lids unremarkable. Cardiovascular: Regular rhythm.  Lungs: Normal work of breathing. Neurologic: No focal deficits.   Lab Results  Component Value Date   CREATININE 0.91 01/11/2022   BUN 18 01/11/2022   NA 143 01/11/2022   K 5.6 (H) 01/11/2022   CL 103 01/11/2022   CO2 24 01/11/2022   Lab Results  Component Value Date   ALT 19 01/11/2022   AST 16 01/11/2022   ALKPHOS 115 01/11/2022   BILITOT 0.2 01/11/2022   Lab Results  Component Value Date   HGBA1C 5.7 (H) 01/11/2022   HGBA1C 5.6 12/01/2021   HGBA1C 5.7 (H) 07/15/2021   HGBA1C 5.7 (H) 01/13/2021   HGBA1C 5.5 09/22/2020   Lab Results  Component Value Date   INSULIN 8.3 01/11/2022   INSULIN 12.5 12/01/2021   INSULIN 8.8 07/15/2021   INSULIN 15.6 01/13/2021   INSULIN 10.7 09/22/2020  Lab Results  Component Value Date   TSH 1.580 12/01/2021   Lab Results  Component Value Date   CHOL 207 (H) 01/11/2022   HDL 86 01/11/2022   LDLCALC 109 (H) 01/11/2022   TRIG 69 01/11/2022   CHOLHDL 2.4 01/11/2022   Lab Results  Component Value Date   VD25OH 71.2 01/11/2022   VD25OH 102.0 (H) 12/01/2021   VD25OH 77.1 05/06/2021   Lab Results  Component Value Date   WBC 8.1 06/19/2020   HGB 13.2 06/19/2020   HCT 42.4 06/19/2020   MCV 88.3 06/19/2020   PLT 374 06/19/2020   No results found for: IRON, TIBC, FERRITIN  Obesity Behavioral  Intervention:   Approximately 15 minutes were spent on the discussion below.  ASK: We discussed the diagnosis of obesity with [name] today and [name] agreed to give [name] permission to discuss obesity behavioral modification therapy today.  ASSESS: Ahitana has the diagnosis of obesity and her BMI today is 39.7. Chantia is in the action stage of change.   ADVISE: [name] was educated on the multiple health risks of obesity as well as the benefit of weight loss to improve her health. She was advised of the need for long term treatment and the importance of lifestyle modifications to improve her current health and to decrease her risk of future health problems.  AGREE: Multiple dietary modification options and treatment options were discussed and [name] agreed to follow the recommendations documented in the above note.  ARRANGE: [name] was educated on the importance of frequent visits to treat obesity as outlined per CMS and USPSTF guidelines and agreed to schedule her next follow up appointment today.  Attestation Statements:   Reviewed by clinician on day of visit: allergies, medications, problem list, medical history, surgical history, family history, social history, and previous encounter notes.  I, Davy Pique, RMA, am acting as Location manager for Mina Marble, NP.  I have reviewed the above documentation for accuracy and completeness, and I agree with the above. -  ***

## 2022-03-22 ENCOUNTER — Telehealth (INDEPENDENT_AMBULATORY_CARE_PROVIDER_SITE_OTHER): Payer: Self-pay | Admitting: Adult Health

## 2022-03-22 ENCOUNTER — Encounter (INDEPENDENT_AMBULATORY_CARE_PROVIDER_SITE_OTHER): Payer: Self-pay

## 2022-03-22 DIAGNOSIS — Z5181 Encounter for therapeutic drug level monitoring: Secondary | ICD-10-CM | POA: Diagnosis not present

## 2022-03-22 DIAGNOSIS — G8929 Other chronic pain: Secondary | ICD-10-CM | POA: Diagnosis not present

## 2022-03-22 DIAGNOSIS — M5030 Other cervical disc degeneration, unspecified cervical region: Secondary | ICD-10-CM | POA: Diagnosis not present

## 2022-03-22 DIAGNOSIS — G894 Chronic pain syndrome: Secondary | ICD-10-CM | POA: Diagnosis not present

## 2022-03-22 DIAGNOSIS — Z79899 Other long term (current) drug therapy: Secondary | ICD-10-CM | POA: Diagnosis not present

## 2022-03-22 NOTE — Telephone Encounter (Signed)
Melissa [name] - Prior authorization denied for [employer]. Patient has [name]. Per insurance:Your plan's Medicare Part D drug plan cannot cover drugs used for loss of appetite (anorexia), weight loss, or weight gain. Your Medicare Part D drug plan was asked to cover the requested drug. The requested drug is used for loss of appetite (anorexia), weight loss, or to help you gain weight. Under section 1927(d)(2) of the Social Security Act, drugs used for loss of appetite (anorexia), weight loss, or weight gain are excluded from coverage under the Medicare Part D drug benefit. Since the requested drug is excluded from Medicare Part D coverage, the request for coverage under your Medicare Part D benefit is denied. Patient sent denial message via mychart.

## 2022-03-23 DIAGNOSIS — M1711 Unilateral primary osteoarthritis, right knee: Secondary | ICD-10-CM | POA: Diagnosis not present

## 2022-03-30 ENCOUNTER — Ambulatory Visit (INDEPENDENT_AMBULATORY_CARE_PROVIDER_SITE_OTHER): Payer: Medicare HMO | Admitting: Adult Health

## 2022-03-30 ENCOUNTER — Encounter (INDEPENDENT_AMBULATORY_CARE_PROVIDER_SITE_OTHER): Payer: Self-pay | Admitting: Adult Health

## 2022-03-30 VITALS — BP 128/77 | HR 91 | Temp 98.0°F | Ht 62.0 in | Wt 216.0 lb

## 2022-03-30 DIAGNOSIS — E8881 Metabolic syndrome: Secondary | ICD-10-CM | POA: Diagnosis not present

## 2022-03-30 DIAGNOSIS — E669 Obesity, unspecified: Secondary | ICD-10-CM

## 2022-03-30 DIAGNOSIS — Z7984 Long term (current) use of oral hypoglycemic drugs: Secondary | ICD-10-CM

## 2022-03-30 DIAGNOSIS — Z6839 Body mass index (BMI) 39.0-39.9, adult: Secondary | ICD-10-CM | POA: Diagnosis not present

## 2022-03-30 DIAGNOSIS — E559 Vitamin D deficiency, unspecified: Secondary | ICD-10-CM

## 2022-03-30 DIAGNOSIS — M1711 Unilateral primary osteoarthritis, right knee: Secondary | ICD-10-CM | POA: Diagnosis not present

## 2022-04-04 NOTE — Progress Notes (Signed)
Chief Complaint:   OBESITY Melissa [name] is here to discuss her progress with her obesity treatment plan along with follow-up of her obesity related diagnoses. Melissa [name] is on the Category 3 Plan and states she is following her eating plan approximately 85% of the time. Melissa [name] states she is gym/farm for 60 minutes 4 times per week.  Today's visit was #: 93 Starting weight: 256 lbs Starting date: 11/18/2019 Today's weight: 216 lbs Today's date: 03/30/2022 Total lbs lost to date: 40 Total lbs lost since last in-office visit: 0  Interim History:  She has been approved for back surgery (L4-5 fusion).   Insurance denied Melissa [name] coverage, she remained on metformin 500 mg, 2 tablets at breakfast and is tolerating it well.   She has brought bioimpedance/scale data from [place] [facility] [facility] that was recorded 03/28/2022.  Subjective:   1. Insulin resistance [name] was denied by insurance, she remained on metformin 500 mg 2 tablets at breakfast. She denies GI upset.  2. Vitamin D deficiency She is on OTC vitamin D3 1000 units daily.  Assessment/Plan:   1. Insulin resistance Continue metformin 500 mg 2 tablets at breakfast daily.   During OV- verbally submitted Wegovy medication appeal.  Estimated response time is 10 days.  2. Vitamin D deficiency Continue oral OTC vitamin D supplement.   We will recheck labs at her next office visit.  3. Obesity, current BMI 39.5 Melissa [name] is currently in the action stage of change. As such, her goal is to continue with weight loss efforts. She has agreed to the Category 3 Plan.   Compared [facility] and [name] Bioimpedance results.  Data is slightly different, however both consistently reflect increase in Muscle Mass and decrease in Adipose Mass over the last 3 months.   We will recheck fasting labs at her next office visit  Exercise goals: As is.  Behavioral modification strategies: increasing lean protein intake, decreasing simple carbohydrates, meal planning and cooking  strategies, keeping healthy foods in the home, and planning for success.  Melissa [name] has agreed to follow-up with our clinic in 4 weeks. She was informed of the importance of frequent follow-up visits to maximize her success with intensive lifestyle modifications for her multiple health conditions.   Objective:   Blood pressure 128/77, pulse 91, temperature 98 F (36.7 C), height 5\' 2"  (1.575 m), weight 216 lb (98 kg), SpO2 99 %. Body mass index is 39.51 kg/m.  General: Cooperative, alert, well developed, in no acute distress. HEENT: Conjunctivae and lids unremarkable. Cardiovascular: Regular rhythm.  Lungs: Normal work of breathing. Neurologic: No focal deficits.   Lab Results  Component Value Date   CREATININE 0.91 01/11/2022   BUN 18 01/11/2022   NA 143 01/11/2022   K 5.6 (H) 01/11/2022   CL 103 01/11/2022   CO2 24 01/11/2022   Lab Results  Component Value Date   ALT 19 01/11/2022   AST 16 01/11/2022   ALKPHOS 115 01/11/2022   BILITOT 0.2 01/11/2022   Lab Results  Component Value Date   HGBA1C 5.7 (H) 01/11/2022   HGBA1C 5.6 12/01/2021   HGBA1C 5.7 (H) 07/15/2021   HGBA1C 5.7 (H) 01/13/2021   HGBA1C 5.5 09/22/2020   Lab Results  Component Value Date   INSULIN 8.3 01/11/2022   INSULIN 12.5 12/01/2021   INSULIN 8.8 07/15/2021   INSULIN 15.6 01/13/2021   INSULIN 10.7 09/22/2020   Lab Results  Component Value Date   TSH 1.580 12/01/2021   Lab Results  Component Value Date  CHOL 207 (H) 01/11/2022   HDL 86 01/11/2022   LDLCALC 109 (H) 01/11/2022   TRIG 69 01/11/2022   CHOLHDL 2.4 01/11/2022   Lab Results  Component Value Date   VD25OH 71.2 01/11/2022   VD25OH 102.0 (H) 12/01/2021   VD25OH 77.1 05/06/2021   Lab Results  Component Value Date   WBC 8.1 06/19/2020   HGB 13.2 06/19/2020   HCT 42.4 06/19/2020   MCV 88.3 06/19/2020   PLT 374 06/19/2020   No results found for: "IRON", "TIBC", "FERRITIN"  Attestation Statements:   Reviewed by clinician  on day of visit: allergies, medications, problem list, medical history, surgical history, family history, social history, and previous encounter notes.  Time spent on visit including pre-visit chart review and post-visit care and charting was 27 minutes.   Wilhemena Durie, am acting as transcriptionist for Mina Marble, NP.  I have reviewed the above documentation for accuracy and completeness, and I agree with the above. -  Nakhi Choi d. Naiya Corral, NP-C

## 2022-04-07 ENCOUNTER — Encounter (INDEPENDENT_AMBULATORY_CARE_PROVIDER_SITE_OTHER): Payer: Self-pay | Admitting: Adult Health

## 2022-04-08 ENCOUNTER — Ambulatory Visit: Payer: Self-pay | Admitting: Orthopedic Surgery

## 2022-04-15 DIAGNOSIS — R7301 Impaired fasting glucose: Secondary | ICD-10-CM | POA: Diagnosis not present

## 2022-04-15 DIAGNOSIS — Z6841 Body Mass Index (BMI) 40.0 and over, adult: Secondary | ICD-10-CM | POA: Diagnosis not present

## 2022-04-15 DIAGNOSIS — E039 Hypothyroidism, unspecified: Secondary | ICD-10-CM | POA: Diagnosis not present

## 2022-04-15 DIAGNOSIS — R69 Illness, unspecified: Secondary | ICD-10-CM | POA: Diagnosis not present

## 2022-04-15 DIAGNOSIS — E782 Mixed hyperlipidemia: Secondary | ICD-10-CM | POA: Diagnosis not present

## 2022-04-15 DIAGNOSIS — M159 Polyosteoarthritis, unspecified: Secondary | ICD-10-CM | POA: Diagnosis not present

## 2022-04-15 DIAGNOSIS — F339 Major depressive disorder, recurrent, unspecified: Secondary | ICD-10-CM | POA: Diagnosis not present

## 2022-04-20 ENCOUNTER — Other Ambulatory Visit (INDEPENDENT_AMBULATORY_CARE_PROVIDER_SITE_OTHER): Payer: Self-pay | Admitting: Adult Health

## 2022-04-20 DIAGNOSIS — E8881 Metabolic syndrome: Secondary | ICD-10-CM

## 2022-04-24 ENCOUNTER — Other Ambulatory Visit (INDEPENDENT_AMBULATORY_CARE_PROVIDER_SITE_OTHER): Payer: Self-pay | Admitting: Adult Health

## 2022-04-24 DIAGNOSIS — E8881 Metabolic syndrome: Secondary | ICD-10-CM

## 2022-05-02 ENCOUNTER — Other Ambulatory Visit (INDEPENDENT_AMBULATORY_CARE_PROVIDER_SITE_OTHER): Payer: Self-pay | Admitting: Adult Health

## 2022-05-02 DIAGNOSIS — E8881 Metabolic syndrome: Secondary | ICD-10-CM

## 2022-05-03 ENCOUNTER — Ambulatory Visit: Payer: Medicare HMO | Admitting: Vascular Surgery

## 2022-05-03 ENCOUNTER — Encounter: Payer: Self-pay | Admitting: Vascular Surgery

## 2022-05-03 VITALS — BP 102/64 | HR 81 | Temp 97.4°F | Resp 16 | Ht 62.0 in | Wt 218.0 lb

## 2022-05-03 DIAGNOSIS — M5416 Radiculopathy, lumbar region: Secondary | ICD-10-CM | POA: Diagnosis not present

## 2022-05-03 NOTE — Pre-Procedure Instructions (Signed)
Surgical Instructions    Your procedure is scheduled on Monday 05/09/22.   Report to [facility] Main Entrance "A" at 05:30 A.M., then check in with the Admitting office.  Call this number if you have problems the morning of surgery:  [phone]   If you have any questions prior to your surgery date call [phone]: Open Monday-Friday 8am-4pm    Remember:  Do not eat or drink after midnight the night before your surgery     Take these medicines the morning of surgery with A SIP OF WATER:   buPROPion (WELLBUTRIN XL)   FLUoxetine (PROZAC)  levothyroxine (SYNTHROID)   LINZESS  pravastatin (PRAVACHOL)    Take these medicines if needed:   acetaminophen (TYLENOL)  busPIRone (BUSPAR)  HYDROcodone-acetaminophen (NORCO/VICODIN)  meclizine (ANTIVERT)   EPINEPHrine  DO NOT TAKE metFORMIN (GLUCOPHAGE) the morning of surgery.   As of today, STOP taking any Aspirin (unless otherwise instructed by your surgeon) Aleve, Naproxen, Ibuprofen, Motrin, Advil, Goody's, BC's, all herbal medications, fish oil, and all vitamins.           Do not wear jewelry or makeup Do not wear lotions, powders, perfumes/colognes, or deodorant. Do not shave 48 hours prior to surgery.  Men may shave face and neck. Do not bring valuables to the hospital. Do not wear nail polish, gel polish, artificial nails, or any other type of covering on natural nails (fingers and toes) If you have artificial nails or gel coating that need to be removed by a nail salon, please have this removed prior to surgery. Artificial nails or gel coating may interfere with anesthesia's ability to adequately monitor your vital signs.  [facility] is not responsible for any belongings or valuables. .   Do NOT Smoke (Tobacco/Vaping)  24 hours prior to your procedure  If you use a CPAP at night, you may bring your mask for your overnight stay.   Contacts, glasses, hearing aids, dentures or partials may not be worn into surgery, please  bring cases for these belongings   For patients admitted to the hospital, discharge time will be determined by your treatment team.   Patients discharged the day of surgery will not be allowed to drive home, and someone needs to stay with them for 24 hours.   SURGICAL WAITING ROOM VISITATION Patients having surgery or a procedure in a hospital may have two support people. Children under the age of 87 must have an adult with them who is not the patient. They may stay in the waiting area during the procedure and may switch out with other visitors. If the patient needs to stay at the hospital during part of their recovery, the visitor guidelines for inpatient rooms apply.  Please refer to the [facility] website for the visitor guidelines for Inpatients (after your surgery is over and you are in a regular room).       Special instructions:    Oral Hygiene is also important to reduce your risk of infection.  Remember - BRUSH YOUR TEETH THE MORNING OF SURGERY WITH YOUR REGULAR TOOTHPASTE   Melissa [name]- Preparing For Surgery  Before surgery, you can play an important role. Because skin is not sterile, your skin needs to be as free of germs as possible. You can reduce the number of germs on your skin by washing with CHG (chlorahexidine gluconate) Soap before surgery.  CHG is an antiseptic cleaner which kills germs and bonds with the skin to continue killing germs even after washing.  Please do not use if you have an allergy to CHG or antibacterial soaps. If your skin becomes reddened/irritated stop using the CHG.  Do not shave (including legs and underarms) for at least 48 hours prior to first CHG shower. It is OK to shave your face.  Please follow these instructions carefully.     Shower the NIGHT BEFORE SURGERY and the MORNING OF SURGERY with CHG Soap.   If you chose to wash your hair, wash your hair first as usual with your normal shampoo. After you shampoo, rinse your hair and body  thoroughly to remove the shampoo.  Then [employer] and genitals (private parts) with your normal soap and rinse thoroughly to remove soap.  After that Use CHG Soap as you would any other liquid soap. You can apply CHG directly to the skin and wash gently with a scrungie or a clean washcloth.   Apply the CHG Soap to your body ONLY FROM THE NECK DOWN.  Do not use on open wounds or open sores. Avoid contact with your eyes, ears, mouth and genitals (private parts). Wash Face and genitals (private parts)  with your normal soap.   Wash thoroughly, paying special attention to the area where your surgery will be performed.  Thoroughly rinse your body with warm water from the neck down.  DO NOT shower/wash with your normal soap after using and rinsing off the CHG Soap.  Pat yourself dry with a CLEAN TOWEL.  Wear CLEAN PAJAMAS to bed the night before surgery  Place CLEAN SHEETS on your bed the night before your surgery  DO NOT SLEEP WITH PETS.   Day of Surgery:  Take a shower with CHG soap. Wear Clean/Comfortable clothing the morning of surgery Do not apply any deodorants/lotions.   Remember to brush your teeth WITH YOUR REGULAR TOOTHPASTE.    If you received a COVID test during your pre-op visit, it is requested that you wear a mask when out in public, stay away from anyone that may not be feeling well, and notify your surgeon if you develop symptoms. If you have been in contact with anyone that has tested positive in the last 10 days, please notify your surgeon.    Please read over the following fact sheets that you were given.

## 2022-05-03 NOTE — Progress Notes (Signed)
Patient name: Melissa [name] MRN: [id number] DOB: [birth year] Sex: female  REASON FOR CONSULT: Evaluate for abdominal exposure for L4-L5 OLIF  HPI: Melissa [name] is a 56 y.o. female, with history of chronic lower back pain, hypertension, osteoarthritis that presents for evaluation of abdominal exposure for L4-L5 OLIF.  Patient states she has been in pain for at least the last several years with lower back pain.  She has been on disability and unable to work.  She has been evaluated by Dr. Shon Baton who has recommended an L4-L5 OLIF.  Vascular surgery was asked to assist with abdominal exposure.  She states her previous abdominal surgery includes a lower midline incision for a left kidney surgery when she was 56 years old.  No other hernia repairs with mesh in her abdominal wall.  Past Medical History:  Diagnosis Date   Allergic rhinitis    Anxiety    Arthritis    Back pain    Constipated    Depression    Diaphragmatic hernia    Fatty liver    GERD (gastroesophageal reflux disease)    Hypertension    Hypothyroidism    IBS (irritable bowel syndrome)    Joint pain    Kidney problem    Lactose intolerance    Morbid obesity (HCC)    OSA (obstructive sleep apnea)    Osteoarthritis    Seasonal allergies    Tinnitus    Vitamin D deficiency     Past Surgical History:  Procedure Laterality Date   CARPAL TUNNEL RELEASE     rt/lt   FINGER ARTHRODESIS  11/08/2011   Procedure: ARTHRODESIS FINGER;  Surgeon: Wyn Forster., MD;  [facility]:  [facility];  Service: Orthopedics;  Laterality: Left;  left thumb carpal-metacarpal fusion   KNEE ARTHROSCOPY     both knees   PARTIAL KNEE ARTHROPLASTY  11/05/2012   Procedure: UNICOMPARTMENTAL KNEE;  Surgeon: [name], MD;  Location: [facility];  Service: Orthopedics;  Laterality: Right;   TONSILLECTOMY     TOTAL SHOULDER ARTHROPLASTY Right 06/25/2020   Procedure: TOTAL SHOULDER ARTHROPLASTY;  Surgeon: Yolonda Kida, MD;   Location: [facility];  Service: Orthopedics;  Laterality: Right;  2.5 hrs   URETER SURGERY     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-little lt kidney function, has one functioning kidney   WISDOM TOOTH EXTRACTION      Family History  Problem Relation Age of Onset   Arthritis Mother        spianl stenosis   Atrial fibrillation Mother    Transient ischemic attack Mother    High Cholesterol Mother    Diabetes Father    Heart attack Father        3 stents   Heart disease Father    Atrial fibrillation Sister     SOCIAL HISTORY: Social History   Socioeconomic History   Marital status: Single    Spouse name: Not on file   Number of children: Not on file   Years of education: Not on file   Highest education level: Not on file  Occupational History   Occupation: [employer]  Tobacco Use   Smoking status: Never   Smokeless tobacco: Never  Vaping Use   Vaping Use: Never used  Substance and Sexual Activity   Alcohol use: No    Comment: Quit 13 years ago   Drug use: Never   Sexual activity: Not on file  Other Topics Concern   Not on file  Social  History Narrative   Lives home alone, (has dogs and cats).  Disabled, due to back.  Education 2 college degree's.  Coffee/ sodas 2-3 daily   Social Determinants of Health   Financial Resource Strain: Not on file  Food Insecurity: Not on file  Transportation Needs: Not on file  Physical Activity: Not on file  Stress: Not on file  Social Connections: Not on file  Intimate Partner Violence: Not on file    Allergies  Allergen Reactions   Bee Venom Hives    Current Outpatient Medications  Medication Sig Dispense Refill   acetaminophen (TYLENOL) 500 MG tablet Take 500-1,000 mg by mouth every 8 (eight) hours as needed (pain.).      Ascorbic Acid (VITAMIN C) 1000 MG tablet Take 3,000 mg by mouth at bedtime.     B Complex-C (B-COMPLEX WITH VITAMIN C) tablet Take 1 tablet by mouth at bedtime.      buPROPion (WELLBUTRIN XL) 300 MG 24 hr tablet  Take 300 mg by mouth daily.     busPIRone (BUSPAR) 5 MG tablet Take 5 mg by mouth 2 (two) times daily as needed for anxiety.     chlorpheniramine (CHLOR-TRIMETON) 4 MG tablet Take 4 mg by mouth daily.     cholecalciferol (VITAMIN D3) 25 MCG (1000 UNIT) tablet Take 1,000 Units by mouth daily.     cyclobenzaprine (FLEXERIL) 10 MG tablet Take 10 mg by mouth at bedtime.     diphenhydrAMINE (BENADRYL) 25 mg capsule Take 1 capsule (25 mg total) by mouth every 6 (six) hours as needed for itching, allergies or sleep. (Patient taking differently: Take 50-75 mg by mouth daily as needed for itching, allergies or sleep.) 30 capsule    EPINEPHrine 0.3 mg/0.3 mL IJ SOAJ injection Inject 0.3 mg into the muscle once as needed (allergic reaction).      FLUoxetine (PROZAC) 40 MG capsule Take 40 mg by mouth daily.     HYDROcodone-acetaminophen (NORCO/VICODIN) 5-325 MG tablet Take 1 tablet by mouth every 6 (six) hours as needed for moderate pain.     levothyroxine (SYNTHROID) 125 MCG tablet Take 125 mcg by mouth daily before breakfast.     Lidocaine (SALONPAS PAIN RELIEVING EX) Apply 1 Application topically daily as needed (pain).     LINZESS 145 MCG CAPS capsule Take 145 mcg by mouth daily.     meclizine (ANTIVERT) 25 MG tablet Take 25 mg by mouth 3 (three) times daily as needed for dizziness.     metFORMIN (GLUCOPHAGE) 500 MG tablet 2 tabs at breakfast 60 tablet 0   montelukast (SINGULAIR) 10 MG tablet Take 10 mg by mouth at bedtime.     Naphazoline-Pheniramine ([employer] EYE ALLERGY RELIEF OP) Place 1 drop into both eyes daily as needed (allergies).     omeprazole (PRILOSEC) 40 MG capsule Take 40 mg by mouth at bedtime.      pravastatin (PRAVACHOL) 80 MG tablet Take by mouth.     triamcinolone cream (KENALOG) 0.1 % Apply 1 application topically 2 (two) times daily as needed (rash).      aspirin EC 81 MG tablet Take 81 mg by mouth daily. (Patient not taking: Reported on 05/03/2022)     Semaglutide-Weight Management  (WEGOVY) 0.25 MG/0.5ML SOAJ Inject 0.25 mg into the skin once a week. (Patient not taking: Reported on 04/27/2022) 2 mL 0   No current facility-administered medications for this visit.    REVIEW OF SYSTEMS:  [X]  denotes positive finding, [ ]  denotes negative finding Cardiac  Comments:  Chest pain or chest pressure:    Shortness of breath upon exertion:    Short of breath when lying flat:    Irregular heart rhythm:        Vascular    Pain in calf, thigh, or hip brought on by ambulation:    Pain in feet at night that wakes you up from your sleep:     Blood clot in your veins:    Leg swelling:         Pulmonary    Oxygen at home:    Productive cough:     Wheezing:         Neurologic    Sudden weakness in arms or legs:     Sudden numbness in arms or legs:     Sudden onset of difficulty speaking or slurred speech:    Temporary loss of vision in one eye:     Problems with dizziness:         Gastrointestinal    Blood in stool:     Vomited blood:         Genitourinary    Burning when urinating:     Blood in urine:        Psychiatric    Major depression:         Hematologic    Bleeding problems:    Problems with blood clotting too easily:        Skin    Rashes or ulcers:        Constitutional    Fever or chills:      PHYSICAL EXAM: Vitals:   05/03/22 1412  BP: 102/64  Pulse: 81  Resp: 16  Temp: (!) 97.4 F (36.3 C)  TempSrc: Temporal  SpO2: 96%  Weight: 218 lb (98.9 kg)  Height: 5\' 2"  (1.575 m)    GENERAL: The patient is a well-nourished female, in no acute distress. The vital signs are documented above. CARDIAC: There is a regular rate and rhythm.  VASCULAR:  Palpable femoral pulses bilaterally Palpable DP pulses bilaterally Lower midline incision well-healed PULMONARY: No respiratory distress.. ABDOMEN: Soft and non-tender. MUSCULOSKELETAL: There are no major deformities or cyanosis. NEUROLOGIC: No focal weakness or paresthesias are detected. SKIN:  There are no ulcers or rashes noted. PSYCHIATRIC: The patient has a normal affect.  DATA:   MRI reviewed from 11/24/2021     Assessment/Plan:  56 y.o. female, with history of chronic lower back pain, hypertension, osteoarthritis that presents for evaluation of abdominal exposure for L4-L5 OLIF.  Discussed her being in the right lateral position and making an oblique incision over her left abdominal wall at the disc level and then entering the retroperitoneum to mobilize peritoneum and left ureter and subsequently mobilized left psoas to get to the L4-L5 disc space from an oblique approach.  Discussed possibly having to mobilize iliac vessels.  Risk of injury to the above structures also discussed.  Look forward to assisting Dr. 41 on Monday.  All questions answered.   Wednesday, MD [facility] [place] Office: [phone]

## 2022-05-04 ENCOUNTER — Encounter (HOSPITAL_COMMUNITY)
Admission: RE | Admit: 2022-05-04 | Discharge: 2022-05-04 | Disposition: A | Discharge status: Home / Self Care | Payer: Medicare HMO | Source: Ambulatory Visit | Attending: Orthopedic Surgery | Admitting: Orthopedic Surgery

## 2022-05-04 ENCOUNTER — Other Ambulatory Visit: Payer: Self-pay

## 2022-05-04 ENCOUNTER — Encounter (HOSPITAL_COMMUNITY): Payer: Self-pay

## 2022-05-04 VITALS — BP 151/80 | HR 78 | Temp 97.9°F | Resp 17 | Ht 62.0 in | Wt 220.1 lb

## 2022-05-04 DIAGNOSIS — Z01818 Encounter for other preprocedural examination: Secondary | ICD-10-CM | POA: Insufficient documentation

## 2022-05-04 LAB — BASIC METABOLIC PANEL
Anion gap: 10 (ref 5–15)
BUN: 18 mg/dL (ref 6–20)
CO2: 27 mmol/L (ref 22–32)
Calcium: 9.6 mg/dL (ref 8.9–10.3)
Chloride: 103 mmol/L (ref 98–111)
Creatinine, Ser: 0.87 mg/dL (ref 0.44–1.00)
GFR, Estimated: >60 mL/min (ref >60)
Glucose, Bld: 122 mg/dL — ABNORMAL HIGH (ref 70–99)
Potassium: 4.4 mmol/L (ref 3.5–5.1)
Sodium: 140 mmol/L (ref 135–145)

## 2022-05-04 LAB — CBC
HCT: 39.6 % (ref 36.0–46.0)
Hemoglobin: 12.7 g/dL (ref 12.0–15.0)
MCH: 27.8 pg (ref 26.0–34.0)
MCHC: 32.1 g/dL (ref 30.0–36.0)
MCV: 86.7 fL (ref 80.0–100.0)
Platelets: 356 10*3/uL (ref 150–400)
RBC: 4.57 MIL/uL (ref 3.87–5.11)
RDW: 13.6 % (ref 11.5–15.5)
WBC: 5.9 10*3/uL (ref 4.0–10.5)
nRBC: 0 % (ref 0.0–0.2)

## 2022-05-04 LAB — TYPE AND SCREEN
ABO/RH(D): O POS
Antibody Screen: NEGATIVE

## 2022-05-04 LAB — SURGICAL PCR SCREEN
MRSA, PCR: NEGATIVE
Staphylococcus aureus: POSITIVE — AB

## 2022-05-04 NOTE — Progress Notes (Signed)
PCP - Elizabeth Palau, NP at [facility] Cardiologist - denies  PPM/ICD - n/a Device Orders - n/a Rep Notified - n/a  Chest x-ray - n/a EKG - 05/04/22 Stress Test - denies ECHO - denies Cardiac Cath - denies  Sleep Study - +OSA. Has not used CPAP in over one year since weight loss.  CPAP - Does not use  Fasting Blood Sugar - n/a Checks Blood Sugar _____ times a day- n/a  Blood Thinner Instructions: n/a Aspirin Instructions: n/a  ERAS Protcol - No. NPO  COVID TEST- n/a   Anesthesia review: No  Patient denies shortness of breath, fever, cough and chest pain at PAT appointment   All instructions explained to the patient, with a verbal understanding of the material. Patient agrees to go over the instructions while at home for a better understanding. The opportunity to ask questions was provided.

## 2022-05-05 ENCOUNTER — Encounter (INDEPENDENT_AMBULATORY_CARE_PROVIDER_SITE_OTHER): Payer: Self-pay | Admitting: Adult Health

## 2022-05-05 ENCOUNTER — Ambulatory Visit (INDEPENDENT_AMBULATORY_CARE_PROVIDER_SITE_OTHER): Payer: Medicare HMO | Admitting: Adult Health

## 2022-05-05 VITALS — BP 112/74 | HR 77 | Temp 98.1°F | Ht 62.0 in | Wt 213.0 lb

## 2022-05-05 DIAGNOSIS — E559 Vitamin D deficiency, unspecified: Secondary | ICD-10-CM

## 2022-05-05 DIAGNOSIS — E669 Obesity, unspecified: Secondary | ICD-10-CM | POA: Diagnosis not present

## 2022-05-05 DIAGNOSIS — R7303 Prediabetes: Secondary | ICD-10-CM | POA: Diagnosis not present

## 2022-05-05 DIAGNOSIS — Z6839 Body mass index (BMI) 39.0-39.9, adult: Secondary | ICD-10-CM | POA: Diagnosis not present

## 2022-05-05 DIAGNOSIS — Z9189 Other specified personal risk factors, not elsewhere classified: Secondary | ICD-10-CM

## 2022-05-05 MED ORDER — METFORMIN HCL 500 MG PO TABS: ORAL_TABLET | ORAL | 0 refills | Status: DC

## 2022-05-06 ENCOUNTER — Other Ambulatory Visit: Payer: Self-pay

## 2022-05-06 LAB — INSULIN, RANDOM: INSULIN: 10.6 u[IU]/mL (ref 2.6–24.9)

## 2022-05-06 LAB — HEMOGLOBIN A1C
Est. average glucose Bld gHb Est-mCnc: 117 mg/dL
Hgb A1c MFr Bld: 5.7 % — ABNORMAL HIGH (ref 4.8–5.6)

## 2022-05-06 LAB — VITAMIN D 25 HYDROXY (VIT D DEFICIENCY, FRACTURES): Vit D, 25-Hydroxy: 61.5 ng/mL (ref 30.0–100.0)

## 2022-05-08 NOTE — Anesthesia Preprocedure Evaluation (Signed)
Anesthesia Evaluation  Patient identified by MRN, date of birth, ID band Patient awake    Reviewed: Allergy & Precautions, NPO status , Patient's Chart, lab work & pertinent test results  History of Anesthesia Complications Negative for: history of anesthetic complications  Airway Mallampati: III  TM Distance: >3 FB Neck ROM: Full    Dental  (+) Dental Advisory Given   Pulmonary sleep apnea (retested, no longer needs cpap) ,   Hx diaphragmatic hernia    Pulmonary exam normal        Cardiovascular hypertension, Normal cardiovascular exam     Neuro/Psych PSYCHIATRIC DISORDERS Anxiety Depression  Tinnitus     GI/Hepatic GERD  Medicated and Controlled, IBS    Endo/Other  Hypothyroidism  Obesity Pre-DM   Renal/GU  Left kidney atrophy, normal creatinine      Musculoskeletal  (+) Arthritis , Osteoarthritis,  narcotic dependent  Abdominal   Peds  Hematology negative hematology ROS (+)   Anesthesia Other Findings   Reproductive/Obstetrics                            Anesthesia Physical Anesthesia Plan  ASA: 3  Anesthesia Plan: General   Post-op Pain Management: Tylenol PO (pre-op)*, Regional block* and Celebrex PO (pre-op)*   Induction: Intravenous  PONV Risk Score and Plan: 3 and Treatment may vary due to age or medical condition, Ondansetron, Dexamethasone, Midazolam and Scopolamine patch - Pre-op  Airway Management Planned: Oral ETT  Additional Equipment: ClearSight  Intra-op Plan:   Post-operative Plan: Extubation in OR  Informed Consent: I have reviewed the patients History and Physical, chart, labs and discussed the procedure including the risks, benefits and alternatives for the proposed anesthesia with the patient or authorized representative who has indicated his/her understanding and acceptance.     Dental advisory given  Plan Discussed with: CRNA and  Anesthesiologist  Anesthesia Plan Comments:       Anesthesia Quick Evaluation

## 2022-05-09 ENCOUNTER — Encounter (HOSPITAL_COMMUNITY): Payer: Self-pay | Admitting: Orthopedic Surgery

## 2022-05-09 ENCOUNTER — Other Ambulatory Visit: Payer: Self-pay

## 2022-05-09 ENCOUNTER — Inpatient Hospital Stay (HOSPITAL_COMMUNITY): Payer: Medicare HMO

## 2022-05-09 ENCOUNTER — Inpatient Hospital Stay (HOSPITAL_COMMUNITY)
Admission: RE | Disposition: A | Discharge status: Home / Self Care | Payer: Self-pay | Source: Home / Self Care | Attending: Orthopedic Surgery

## 2022-05-09 ENCOUNTER — Inpatient Hospital Stay (HOSPITAL_COMMUNITY)
Admission: RE | Admit: 2022-05-09 | Discharge: 2022-05-11 | DRG: 460 | Disposition: A | Discharge status: Home / Self Care | Payer: Medicare HMO | Attending: Orthopedic Surgery | Admitting: Orthopedic Surgery

## 2022-05-09 ENCOUNTER — Inpatient Hospital Stay (HOSPITAL_COMMUNITY): Payer: Medicare HMO | Admitting: Anesthesiology

## 2022-05-09 DIAGNOSIS — M5416 Radiculopathy, lumbar region: Secondary | ICD-10-CM | POA: Diagnosis not present

## 2022-05-09 DIAGNOSIS — Z8249 Family history of ischemic heart disease and other diseases of the circulatory system: Secondary | ICD-10-CM

## 2022-05-09 DIAGNOSIS — M549 Dorsalgia, unspecified: Secondary | ICD-10-CM | POA: Diagnosis not present

## 2022-05-09 DIAGNOSIS — E039 Hypothyroidism, unspecified: Secondary | ICD-10-CM | POA: Diagnosis present

## 2022-05-09 DIAGNOSIS — M5116 Intervertebral disc disorders with radiculopathy, lumbar region: Secondary | ICD-10-CM | POA: Diagnosis not present

## 2022-05-09 DIAGNOSIS — Z01818 Encounter for other preprocedural examination: Secondary | ICD-10-CM

## 2022-05-09 DIAGNOSIS — M48061 Spinal stenosis, lumbar region without neurogenic claudication: Secondary | ICD-10-CM | POA: Diagnosis not present

## 2022-05-09 DIAGNOSIS — Z981 Arthrodesis status: Secondary | ICD-10-CM

## 2022-05-09 DIAGNOSIS — Z823 Family history of stroke: Secondary | ICD-10-CM

## 2022-05-09 DIAGNOSIS — E559 Vitamin D deficiency, unspecified: Secondary | ICD-10-CM | POA: Diagnosis present

## 2022-05-09 DIAGNOSIS — R69 Illness, unspecified: Secondary | ICD-10-CM | POA: Diagnosis not present

## 2022-05-09 DIAGNOSIS — Z7982 Long term (current) use of aspirin: Secondary | ICD-10-CM

## 2022-05-09 DIAGNOSIS — G8918 Other acute postprocedural pain: Secondary | ICD-10-CM | POA: Diagnosis not present

## 2022-05-09 DIAGNOSIS — F1120 Opioid dependence, uncomplicated: Secondary | ICD-10-CM | POA: Diagnosis present

## 2022-05-09 DIAGNOSIS — G4733 Obstructive sleep apnea (adult) (pediatric): Secondary | ICD-10-CM | POA: Diagnosis present

## 2022-05-09 DIAGNOSIS — G8929 Other chronic pain: Secondary | ICD-10-CM | POA: Diagnosis not present

## 2022-05-09 DIAGNOSIS — Z6839 Body mass index (BMI) 39.0-39.9, adult: Secondary | ICD-10-CM

## 2022-05-09 DIAGNOSIS — Z79899 Other long term (current) drug therapy: Secondary | ICD-10-CM

## 2022-05-09 DIAGNOSIS — F32A Depression, unspecified: Secondary | ICD-10-CM | POA: Diagnosis present

## 2022-05-09 DIAGNOSIS — M4326 Fusion of spine, lumbar region: Secondary | ICD-10-CM | POA: Diagnosis not present

## 2022-05-09 DIAGNOSIS — M1990 Unspecified osteoarthritis, unspecified site: Secondary | ICD-10-CM | POA: Diagnosis present

## 2022-05-09 DIAGNOSIS — K589 Irritable bowel syndrome without diarrhea: Secondary | ICD-10-CM | POA: Diagnosis present

## 2022-05-09 DIAGNOSIS — K219 Gastro-esophageal reflux disease without esophagitis: Secondary | ICD-10-CM | POA: Diagnosis not present

## 2022-05-09 DIAGNOSIS — Z8261 Family history of arthritis: Secondary | ICD-10-CM

## 2022-05-09 DIAGNOSIS — M4316 Spondylolisthesis, lumbar region: Secondary | ICD-10-CM | POA: Diagnosis not present

## 2022-05-09 DIAGNOSIS — Z79890 Hormone replacement therapy (postmenopausal): Secondary | ICD-10-CM

## 2022-05-09 DIAGNOSIS — Z91030 Bee allergy status: Secondary | ICD-10-CM

## 2022-05-09 DIAGNOSIS — Z83438 Family history of other disorder of lipoprotein metabolism and other lipidemia: Secondary | ICD-10-CM

## 2022-05-09 DIAGNOSIS — Z96611 Presence of right artificial shoulder joint: Secondary | ICD-10-CM | POA: Diagnosis not present

## 2022-05-09 DIAGNOSIS — K760 Fatty (change of) liver, not elsewhere classified: Secondary | ICD-10-CM | POA: Diagnosis not present

## 2022-05-09 DIAGNOSIS — I10 Essential (primary) hypertension: Secondary | ICD-10-CM | POA: Diagnosis not present

## 2022-05-09 DIAGNOSIS — F419 Anxiety disorder, unspecified: Secondary | ICD-10-CM | POA: Diagnosis present

## 2022-05-09 DIAGNOSIS — Z833 Family history of diabetes mellitus: Secondary | ICD-10-CM

## 2022-05-09 HISTORY — PX: OBLIQUE LUMBAR INTERBODY FUSION 1 LEVEL WITH PERCUTANEOUS SCREWS: SHX6890

## 2022-05-09 HISTORY — PX: ABDOMINAL EXPOSURE: SHX5708

## 2022-05-09 SURGERY — OBLIQUE LUMBAR INTERBODY FUSION 1 LEVEL WITH PERCUTANEOUS SCREWS
Anesthesia: General | Site: Spine Lumbar

## 2022-05-09 MED ORDER — BUPIVACAINE-EPINEPHRINE 0.25% -1:200000 IJ SOLN
INTRAMUSCULAR | Status: DC | PRN
  Administered 2022-05-09: 20 mL

## 2022-05-09 MED ORDER — OXYCODONE HCL 5 MG/5ML PO SOLN: 5.0000 mg | Freq: Once | ORAL | Status: DC | PRN

## 2022-05-09 MED ORDER — CHLORHEXIDINE GLUCONATE 0.12 % MT SOLN: 15.0000 mL | Freq: Once | OROMUCOSAL | Status: AC

## 2022-05-09 MED ORDER — PHENOL 1.4 % MT LIQD
1.0000 | OROMUCOSAL | Status: DC | PRN
Start: 2022-05-09 — End: 2022-05-11

## 2022-05-09 MED ORDER — HYDROMORPHONE HCL 1 MG/ML IJ SOLN: 0.2500 mg | INTRAMUSCULAR | Status: DC | PRN

## 2022-05-09 MED ORDER — CHLORHEXIDINE GLUCONATE 0.12 % MT SOLN
OROMUCOSAL | Status: AC
  Administered 2022-05-09: 15 mL via OROMUCOSAL
  Filled 2022-05-09: qty 15

## 2022-05-09 MED ORDER — METHOCARBAMOL 500 MG PO TABS: 500.0000 mg | ORAL_TABLET | Freq: Three times a day (TID) | ORAL | 0 refills | Status: AC | PRN

## 2022-05-09 MED ORDER — LEVOTHYROXINE SODIUM 25 MCG PO TABS
125.0000 ug | ORAL_TABLET | Freq: Every day | ORAL | Status: DC
  Administered 2022-05-10 – 2022-05-11 (×2): 125 ug via ORAL
  Filled 2022-05-09 (×2): qty 1

## 2022-05-09 MED ORDER — DEXAMETHASONE SODIUM PHOSPHATE 10 MG/ML IJ SOLN
INTRAMUSCULAR | Status: AC
  Filled 2022-05-09: qty 1

## 2022-05-09 MED ORDER — MIDAZOLAM HCL 2 MG/2ML IJ SOLN
INTRAMUSCULAR | Status: AC
  Filled 2022-05-09: qty 2

## 2022-05-09 MED ORDER — HYDROMORPHONE HCL 1 MG/ML IJ SOLN
1.0000 mg | INTRAMUSCULAR | Status: AC | PRN
  Administered 2022-05-10: 1 mg via INTRAVENOUS
  Filled 2022-05-09: qty 1

## 2022-05-09 MED ORDER — SODIUM CHLORIDE 0.9 % IV SOLN
INTRAVENOUS | Status: DC
Start: 2022-05-09 — End: 2022-05-11

## 2022-05-09 MED ORDER — PROPOFOL 1000 MG/100ML IV EMUL
INTRAVENOUS | Status: AC
  Filled 2022-05-09: qty 100

## 2022-05-09 MED ORDER — PROPOFOL 500 MG/50ML IV EMUL
INTRAVENOUS | Status: DC | PRN
  Administered 2022-05-09: 75 ug/kg/min via INTRAVENOUS

## 2022-05-09 MED ORDER — EPINEPHRINE 0.3 MG/0.3ML IJ SOAJ: 0.3000 mg | Freq: Once | INTRAMUSCULAR | Status: DC | PRN

## 2022-05-09 MED ORDER — BUPROPION HCL ER (XL) 300 MG PO TB24
300.0000 mg | ORAL_TABLET | Freq: Every day | ORAL | Status: DC
  Administered 2022-05-10 – 2022-05-11 (×2): 300 mg via ORAL
  Filled 2022-05-09 (×2): qty 1

## 2022-05-09 MED ORDER — BUPIVACAINE LIPOSOME 1.3 % IJ SUSP
INTRAMUSCULAR | Status: DC | PRN
  Administered 2022-05-09: 10 mL

## 2022-05-09 MED ORDER — FLUOXETINE HCL 20 MG PO CAPS
40.0000 mg | ORAL_CAPSULE | Freq: Every day | ORAL | Status: DC
  Administered 2022-05-10 – 2022-05-11 (×2): 40 mg via ORAL
  Filled 2022-05-09 (×2): qty 2

## 2022-05-09 MED ORDER — TRANEXAMIC ACID-NACL 1000-0.7 MG/100ML-% IV SOLN
1000.0000 mg | INTRAVENOUS | Status: AC
  Administered 2022-05-09: 1000 mg via INTRAVENOUS

## 2022-05-09 MED ORDER — HEPARIN 6000 UNIT IRRIGATION SOLUTION
Status: AC
  Filled 2022-05-09: qty 500

## 2022-05-09 MED ORDER — SUGAMMADEX SODIUM 200 MG/2ML IV SOLN
INTRAVENOUS | Status: DC | PRN
  Administered 2022-05-09: 100 mg via INTRAVENOUS

## 2022-05-09 MED ORDER — THROMBIN (RECOMBINANT) 20000 UNITS EX SOLR
CUTANEOUS | Status: AC
  Filled 2022-05-09: qty 20000

## 2022-05-09 MED ORDER — LINACLOTIDE 145 MCG PO CAPS
145.0000 ug | ORAL_CAPSULE | Freq: Every day | ORAL | Status: DC
  Administered 2022-05-10 – 2022-05-11 (×2): 145 ug via ORAL
  Filled 2022-05-09 (×2): qty 1

## 2022-05-09 MED ORDER — PHENYLEPHRINE HCL-NACL 20-0.9 MG/250ML-% IV SOLN
INTRAVENOUS | Status: DC | PRN
  Administered 2022-05-09: 25 ug/min via INTRAVENOUS

## 2022-05-09 MED ORDER — FENTANYL CITRATE (PF) 100 MCG/2ML IJ SOLN
INTRAMUSCULAR | Status: AC
  Filled 2022-05-09: qty 2

## 2022-05-09 MED ORDER — FENTANYL CITRATE (PF) 250 MCG/5ML IJ SOLN
INTRAMUSCULAR | Status: AC
  Filled 2022-05-09: qty 5

## 2022-05-09 MED ORDER — LACTATED RINGERS IV SOLN: INTRAVENOUS | Status: DC

## 2022-05-09 MED ORDER — ONDANSETRON HCL 4 MG/2ML IJ SOLN: 4.0000 mg | Freq: Once | INTRAMUSCULAR | Status: DC | PRN

## 2022-05-09 MED ORDER — MENTHOL 3 MG MT LOZG: 1.0000 | LOZENGE | OROMUCOSAL | Status: DC | PRN

## 2022-05-09 MED ORDER — CELECOXIB 200 MG PO CAPS
ORAL_CAPSULE | ORAL | Status: AC
  Administered 2022-05-09: 200 mg via ORAL
  Filled 2022-05-09: qty 1

## 2022-05-09 MED ORDER — CELECOXIB 200 MG PO CAPS: 200.0000 mg | ORAL_CAPSULE | Freq: Once | ORAL | Status: AC

## 2022-05-09 MED ORDER — PROPOFOL 10 MG/ML IV BOLUS
INTRAVENOUS | Status: DC | PRN
  Administered 2022-05-09: 160 mg via INTRAVENOUS

## 2022-05-09 MED ORDER — SODIUM CHLORIDE 0.9% FLUSH
3.0000 mL | Freq: Two times a day (BID) | INTRAVENOUS | Status: DC
Start: 2022-05-09 — End: 2022-05-11
  Administered 2022-05-09 – 2022-05-10 (×2): 3 mL via INTRAVENOUS

## 2022-05-09 MED ORDER — ONDANSETRON HCL 4 MG/2ML IJ SOLN: 4.0000 mg | Freq: Four times a day (QID) | INTRAMUSCULAR | Status: DC | PRN

## 2022-05-09 MED ORDER — ACETAMINOPHEN 650 MG RE SUPP: 650.0000 mg | RECTAL | Status: DC | PRN

## 2022-05-09 MED ORDER — OXYCODONE-ACETAMINOPHEN 10-325 MG PO TABS: 1.0000 | ORAL_TABLET | Freq: Four times a day (QID) | ORAL | 0 refills | Status: AC | PRN

## 2022-05-09 MED ORDER — CHLORHEXIDINE GLUCONATE CLOTH 2 % EX PADS: 6.0000 | MEDICATED_PAD | Freq: Once | CUTANEOUS | Status: DC

## 2022-05-09 MED ORDER — METHOCARBAMOL 1000 MG/10ML IJ SOLN: 500.0000 mg | Freq: Four times a day (QID) | INTRAVENOUS | Status: DC | PRN

## 2022-05-09 MED ORDER — BUPIVACAINE HCL (PF) 0.5 % IJ SOLN
INTRAMUSCULAR | Status: DC | PRN
  Administered 2022-05-09: 15 mL

## 2022-05-09 MED ORDER — ACETAMINOPHEN 500 MG PO TABS
ORAL_TABLET | ORAL | Status: AC
  Administered 2022-05-09: 1000 mg via ORAL
  Filled 2022-05-09: qty 2

## 2022-05-09 MED ORDER — ONDANSETRON HCL 4 MG PO TABS: 4.0000 mg | ORAL_TABLET | Freq: Four times a day (QID) | ORAL | Status: DC | PRN

## 2022-05-09 MED ORDER — ACETAMINOPHEN 500 MG PO TABS: 1000.0000 mg | ORAL_TABLET | Freq: Once | ORAL | Status: AC

## 2022-05-09 MED ORDER — CEFAZOLIN SODIUM-DEXTROSE 2-4 GM/100ML-% IV SOLN
INTRAVENOUS | Status: AC
  Filled 2022-05-09: qty 100

## 2022-05-09 MED ORDER — THROMBIN 20000 UNITS EX SOLR: CUTANEOUS | Status: DC | PRN

## 2022-05-09 MED ORDER — POLYETHYLENE GLYCOL 3350 17 G PO PACK
17.0000 g | PACK | Freq: Every day | ORAL | Status: DC | PRN
Start: 2022-05-09 — End: 2022-05-11
  Administered 2022-05-10: 17 g via ORAL
  Filled 2022-05-09: qty 1

## 2022-05-09 MED ORDER — LACTATED RINGERS IV SOLN: INTRAVENOUS | Status: DC | PRN

## 2022-05-09 MED ORDER — ONDANSETRON HCL 4 MG/2ML IJ SOLN
INTRAMUSCULAR | Status: AC
Start: 2022-05-09 — End: ?
  Filled 2022-05-09: qty 2

## 2022-05-09 MED ORDER — OXYCODONE HCL 5 MG PO TABS: 5.0000 mg | ORAL_TABLET | ORAL | Status: DC | PRN

## 2022-05-09 MED ORDER — TRANEXAMIC ACID-NACL 1000-0.7 MG/100ML-% IV SOLN
INTRAVENOUS | Status: AC
  Filled 2022-05-09: qty 100

## 2022-05-09 MED ORDER — HYDROMORPHONE HCL 1 MG/ML IJ SOLN
INTRAMUSCULAR | Status: AC
  Filled 2022-05-09: qty 1

## 2022-05-09 MED ORDER — SODIUM CHLORIDE 0.9% FLUSH: 3.0000 mL | INTRAVENOUS | Status: DC | PRN

## 2022-05-09 MED ORDER — ORAL CARE MOUTH RINSE: 15.0000 mL | Freq: Once | OROMUCOSAL | Status: AC

## 2022-05-09 MED ORDER — CEFAZOLIN SODIUM-DEXTROSE 2-4 GM/100ML-% IV SOLN
2.0000 g | INTRAVENOUS | Status: AC
  Administered 2022-05-09: 2 g via INTRAVENOUS

## 2022-05-09 MED ORDER — OXYCODONE HCL 5 MG PO TABS: 5.0000 mg | ORAL_TABLET | Freq: Once | ORAL | Status: DC | PRN

## 2022-05-09 MED ORDER — LIDOCAINE 2% (20 MG/ML) 5 ML SYRINGE
INTRAMUSCULAR | Status: AC
  Filled 2022-05-09: qty 5

## 2022-05-09 MED ORDER — FENTANYL CITRATE (PF) 100 MCG/2ML IJ SOLN
INTRAMUSCULAR | Status: DC | PRN
  Administered 2022-05-09: 150 ug via INTRAVENOUS
  Administered 2022-05-09 (×3): 50 ug via INTRAVENOUS

## 2022-05-09 MED ORDER — ONDANSETRON HCL 4 MG/2ML IJ SOLN
INTRAMUSCULAR | Status: DC | PRN
  Administered 2022-05-09: 4 mg via INTRAVENOUS

## 2022-05-09 MED ORDER — METHOCARBAMOL 500 MG PO TABS
500.0000 mg | ORAL_TABLET | Freq: Four times a day (QID) | ORAL | Status: DC | PRN
  Administered 2022-05-09 – 2022-05-11 (×7): 500 mg via ORAL
  Filled 2022-05-09 (×7): qty 1

## 2022-05-09 MED ORDER — ONDANSETRON HCL 4 MG PO TABS: 4.0000 mg | ORAL_TABLET | Freq: Three times a day (TID) | ORAL | 0 refills | Status: DC | PRN

## 2022-05-09 MED ORDER — DEXAMETHASONE SODIUM PHOSPHATE 10 MG/ML IJ SOLN
INTRAMUSCULAR | Status: DC | PRN
  Administered 2022-05-09: 5 mg via INTRAVENOUS

## 2022-05-09 MED ORDER — ROCURONIUM BROMIDE 10 MG/ML (PF) SYRINGE
PREFILLED_SYRINGE | INTRAVENOUS | Status: DC | PRN
  Administered 2022-05-09: 60 mg via INTRAVENOUS
  Administered 2022-05-09: 30 mg via INTRAVENOUS

## 2022-05-09 MED ORDER — ACETAMINOPHEN 325 MG PO TABS
650.0000 mg | ORAL_TABLET | ORAL | Status: DC | PRN
  Administered 2022-05-09 – 2022-05-11 (×4): 650 mg via ORAL
  Filled 2022-05-09 (×4): qty 2

## 2022-05-09 MED ORDER — METFORMIN HCL 500 MG PO TABS
500.0000 mg | ORAL_TABLET | Freq: Every day | ORAL | Status: DC
Start: 2022-05-10 — End: 2022-05-11
  Administered 2022-05-10 – 2022-05-11 (×2): 500 mg via ORAL
  Filled 2022-05-09 (×2): qty 1

## 2022-05-09 MED ORDER — FENTANYL CITRATE (PF) 100 MCG/2ML IJ SOLN
25.0000 ug | INTRAMUSCULAR | Status: DC | PRN
  Administered 2022-05-09 (×3): 50 ug via INTRAVENOUS

## 2022-05-09 MED ORDER — BUPIVACAINE-EPINEPHRINE (PF) 0.25% -1:200000 IJ SOLN
INTRAMUSCULAR | Status: AC
Start: 2022-05-09 — End: ?
  Filled 2022-05-09: qty 30

## 2022-05-09 MED ORDER — ROCURONIUM BROMIDE 10 MG/ML (PF) SYRINGE
PREFILLED_SYRINGE | INTRAVENOUS | Status: AC
  Filled 2022-05-09: qty 10

## 2022-05-09 MED ORDER — 0.9 % SODIUM CHLORIDE (POUR BTL) OPTIME
TOPICAL | Status: DC | PRN
  Administered 2022-05-09: 1000 mL

## 2022-05-09 MED ORDER — PROPOFOL 10 MG/ML IV BOLUS
INTRAVENOUS | Status: AC
  Filled 2022-05-09: qty 20

## 2022-05-09 MED ORDER — BUSPIRONE HCL 5 MG PO TABS: 5.0000 mg | ORAL_TABLET | Freq: Two times a day (BID) | ORAL | Status: DC | PRN

## 2022-05-09 MED ORDER — CEFAZOLIN SODIUM-DEXTROSE 1-4 GM/50ML-% IV SOLN
1.0000 g | Freq: Three times a day (TID) | INTRAVENOUS | Status: AC
  Administered 2022-05-09 (×2): 1 g via INTRAVENOUS
  Filled 2022-05-09 (×2): qty 50

## 2022-05-09 MED ORDER — SENNOSIDES-DOCUSATE SODIUM 8.6-50 MG PO TABS
1.0000 | ORAL_TABLET | Freq: Two times a day (BID) | ORAL | Status: DC
Start: 2022-05-09 — End: 2022-05-11
  Administered 2022-05-09 – 2022-05-11 (×4): 1 via ORAL
  Filled 2022-05-09 (×4): qty 1

## 2022-05-09 MED ORDER — LIDOCAINE 2% (20 MG/ML) 5 ML SYRINGE
INTRAMUSCULAR | Status: DC | PRN
  Administered 2022-05-09: 40 mg via INTRAVENOUS

## 2022-05-09 MED ORDER — OXYCODONE HCL 5 MG PO TABS
10.0000 mg | ORAL_TABLET | ORAL | Status: DC | PRN
  Administered 2022-05-09 – 2022-05-11 (×14): 10 mg via ORAL
  Filled 2022-05-09 (×14): qty 2

## 2022-05-09 MED ORDER — MIDAZOLAM HCL 2 MG/2ML IJ SOLN
INTRAMUSCULAR | Status: DC | PRN
  Administered 2022-05-09 (×2): 1 mg via INTRAVENOUS

## 2022-05-09 SURGICAL SUPPLY — 105 items
AGENT HMST KT MTR STRL THRMB (HEMOSTASIS) ×2
APPLIER CLIP 11 MED OPEN (CLIP) ×6
APR CLP MED 11 20 MLT OPN (CLIP) ×4
BAG COUNTER SPONGE SURGICOUNT (BAG) ×9 IMPLANT
BAG SPNG CNTER NS LX DISP (BAG) ×6
BLADE CLIPPER SURG (BLADE) IMPLANT
BLADE SURG 10 STRL SS (BLADE) ×5 IMPLANT
CABLE BIPOLOR RESECTION CORD (MISCELLANEOUS) ×4 IMPLANT
CATH FOLEY 2WAY SLVR  5CC 16FR (CATHETERS) ×3
CATH FOLEY 2WAY SLVR 5CC 16FR (CATHETERS) ×3 IMPLANT
CLIP APPLIE 11 MED OPEN (CLIP) ×6 IMPLANT
CLIP LIGATING EXTRA MED SLVR (CLIP) IMPLANT
CLIP NEUROVISION LG (CLIP) ×1 IMPLANT
CLSR STERI-STRIP ANTIMIC 1/2X4 (GAUZE/BANDAGES/DRESSINGS) ×1 IMPLANT
COVER SURGICAL LIGHT HANDLE (MISCELLANEOUS) ×4 IMPLANT
DISSECTOR BLUNT TIP ENDO 5MM (MISCELLANEOUS) ×8 IMPLANT
DRAIN CHANNEL 15F RND FF W/TCR (WOUND CARE) IMPLANT
DRAPE C-ARM 42X72 X-RAY (DRAPES) ×5 IMPLANT
DRAPE C-ARMOR (DRAPES) ×5 IMPLANT
DRAPE INCISE IOBAN 66X45 STRL (DRAPES) ×1 IMPLANT
DRSG OPSITE POSTOP 3X4 (GAUZE/BANDAGES/DRESSINGS) ×4 IMPLANT
DRSG OPSITE POSTOP 4X10 (GAUZE/BANDAGES/DRESSINGS) ×4 IMPLANT
DRSG OPSITE POSTOP 4X6 (GAUZE/BANDAGES/DRESSINGS) ×4 IMPLANT
DRSG OPSITE POSTOP 4X8 (GAUZE/BANDAGES/DRESSINGS) ×1 IMPLANT
DURAPREP 26ML APPLICATOR (WOUND CARE) ×5 IMPLANT
ELECT BLADE 4.0 EZ CLEAN MEGAD (MISCELLANEOUS) ×3
ELECT BLADE 6.5 EXT (BLADE) ×4 IMPLANT
ELECT CAUTERY BLADE 6.4 (BLADE) ×4 IMPLANT
ELECT PENCIL ROCKER SW 15FT (MISCELLANEOUS) ×5 IMPLANT
ELECT REM PT RETURN 9FT ADLT (ELECTROSURGICAL) ×3
ELECTRODE BLDE 4.0 EZ CLN MEGD (MISCELLANEOUS) ×3 IMPLANT
ELECTRODE REM PT RTRN 9FT ADLT (ELECTROSURGICAL) ×3 IMPLANT
GAUZE 4X4 16PLY ~~LOC~~+RFID DBL (SPONGE) IMPLANT
GLOVE BIO SURGEON STRL SZ 6.5 (GLOVE) ×3 IMPLANT
GLOVE BIO SURGEON STRL SZ7.5 (GLOVE) ×4 IMPLANT
GLOVE BIOGEL PI IND STRL 6.5 (GLOVE) ×3 IMPLANT
GLOVE BIOGEL PI IND STRL 8 (GLOVE) ×3 IMPLANT
GLOVE BIOGEL PI IND STRL 8.5 (GLOVE) ×3 IMPLANT
GLOVE BIOGEL PI INDICATOR 6.5 (GLOVE)
GLOVE BIOGEL PI INDICATOR 8 (GLOVE) ×1
GLOVE BIOGEL PI INDICATOR 8.5 (GLOVE) ×1
GLOVE SS BIOGEL STRL SZ 8.5 (GLOVE) ×3 IMPLANT
GLOVE SUPERSENSE BIOGEL SZ 8.5 (GLOVE) ×2
GOWN STRL REUS W/ TWL LRG LVL3 (GOWN DISPOSABLE) ×6 IMPLANT
GOWN STRL REUS W/ TWL XL LVL3 (GOWN DISPOSABLE) ×6 IMPLANT
GOWN STRL REUS W/TWL 2XL LVL3 (GOWN DISPOSABLE) ×8 IMPLANT
GOWN STRL REUS W/TWL LRG LVL3 (GOWN DISPOSABLE) ×6
GOWN STRL REUS W/TWL XL LVL3 (GOWN DISPOSABLE) ×6
GUIDEWIRE NITINOL BEVEL TIP (WIRE) ×4 IMPLANT
INSERT FOGARTY 61MM (MISCELLANEOUS) IMPLANT
INSERT FOGARTY SM (MISCELLANEOUS) IMPLANT
KIT BASIN OR (CUSTOM PROCEDURE TRAY) ×4 IMPLANT
KIT TURNOVER KIT B (KITS) ×4 IMPLANT
MODULE EMG NDL SSEP NVM5 (NEEDLE) IMPLANT
MODULE EMG NEEDLE SSEP NVM5 (NEEDLE) ×3 IMPLANT
MODULE NVM5 NEXT GEN EMG (NEEDLE) ×1 IMPLANT
NDL SPNL 18GX3.5 QUINCKE PK (NEEDLE) ×3 IMPLANT
NEEDLE 22X1 1/2 (OR ONLY) (NEEDLE) ×4 IMPLANT
NEEDLE SPNL 18GX3.5 QUINCKE PK (NEEDLE) ×3 IMPLANT
NS IRRIG 1000ML POUR BTL (IV SOLUTION) ×4 IMPLANT
PACK LAMINECTOMY ORTHO (CUSTOM PROCEDURE TRAY) ×4 IMPLANT
PACK UNIVERSAL I (CUSTOM PROCEDURE TRAY) ×4 IMPLANT
PAD ARMBOARD 7.5X6 YLW CONV (MISCELLANEOUS) ×17 IMPLANT
PROBE BALL TIP NVM5 SNG USE (BALLOONS) ×1 IMPLANT
PUTTY BONE DBX 5CC MIX (Putty) ×2 IMPLANT
ROD RELINE MAS TI LORD 5.5X40 (Rod) ×2 IMPLANT
SCREW LOCK RELINE 5.5 TULIP (Screw) ×5 IMPLANT
SCREW RELINE MAS POLY 6.5X40MM (Screw) ×2 IMPLANT
SCREW RELINE RED 6.5X45MM POLY (Screw) ×2 IMPLANT
SPACER RISE-L 18X50 8-15 6D (Spacer) IMPLANT
SPACER RISE-L 18X50 8-15MM6DEG (Spacer) ×1 IMPLANT
SPONGE INTESTINAL PEANUT (DISPOSABLE) ×9 IMPLANT
SPONGE SURGIFOAM ABS GEL 100 (HEMOSTASIS) ×4 IMPLANT
SPONGE T-LAP 18X18 ~~LOC~~+RFID (SPONGE) ×7 IMPLANT
SPONGE T-LAP 4X18 ~~LOC~~+RFID (SPONGE) ×6 IMPLANT
STAPLER VISISTAT 35W (STAPLE) ×4 IMPLANT
SURGIFLO W/THROMBIN 8M KIT (HEMOSTASIS) ×1 IMPLANT
SUT BONE WAX W31G (SUTURE) IMPLANT
SUT MNCRL AB 3-0 PS2 27 (SUTURE) ×8 IMPLANT
SUT MNCRL+ AB 3-0 CT1 36 (SUTURE) IMPLANT
SUT MONOCRYL AB 3-0 CT1 36IN (SUTURE) ×6
SUT PDS AB 1 CTX 36 (SUTURE) ×4 IMPLANT
SUT PROLENE 4 0 RB 1 (SUTURE)
SUT PROLENE 4-0 RB1 .5 CRCL 36 (SUTURE) IMPLANT
SUT PROLENE 5 0 C 1 24 (SUTURE) ×4 IMPLANT
SUT PROLENE 5 0 CC1 (SUTURE) IMPLANT
SUT PROLENE 6 0 C 1 30 (SUTURE) ×1 IMPLANT
SUT PROLENE 6 0 CC (SUTURE) IMPLANT
SUT SILK 0 TIES 10X30 (SUTURE) IMPLANT
SUT SILK 2 0 TIES 10X30 (SUTURE) ×8 IMPLANT
SUT SILK 2 0SH CR/8 30 (SUTURE) IMPLANT
SUT SILK 3 0 TIES 10X30 (SUTURE) ×3 IMPLANT
SUT SILK 3 0 TIES 17X18 (SUTURE)
SUT SILK 3 0SH CR/8 30 (SUTURE) IMPLANT
SUT SILK 3-0 18XBRD TIE BLK (SUTURE) IMPLANT
SUT VIC AB 0 CT1 27 (SUTURE) ×3
SUT VIC AB 0 CT1 27XBRD ANBCTR (SUTURE) IMPLANT
SUT VIC AB 1 CT1 18XCR BRD 8 (SUTURE) ×6 IMPLANT
SUT VIC AB 1 CT1 8-18 (SUTURE) ×6
SUT VIC AB 2-0 CT1 18 (SUTURE) ×9 IMPLANT
SYR BULB IRRIG 60ML STRL (SYRINGE) ×4 IMPLANT
TAPE CLOTH 4X10 WHT NS (GAUZE/BANDAGES/DRESSINGS) ×12 IMPLANT
TOWEL GREEN STERILE (TOWEL DISPOSABLE) ×8 IMPLANT
TOWEL GREEN STERILE FF (TOWEL DISPOSABLE) ×4 IMPLANT
WATER STERILE IRR 1000ML POUR (IV SOLUTION) ×4 IMPLANT

## 2022-05-09 NOTE — Transfer of Care (Signed)
Immediate Anesthesia Transfer of Care Note  Patient: Melissa [name]  Procedure(s) Performed: OBLIQUE LUMBAR INTERBODY FUSION 1 LEVEL WITH PERCUTANEOUS SCREWS (OLIF L4-5 WITH POSTERIOR SPINAL FUSION INTERBODY) (Spine Lumbar) ABDOMINAL EXPOSURE  Patient Location: PACU  Anesthesia Type:General  Level of Consciousness: drowsy and patient cooperative  Airway & Oxygen Therapy: Patient Spontanous Breathing  Post-op Assessment: Report given to RN and Patient moving all extremities X 4  Post vital signs: Reviewed and stable  Last Vitals:  Vitals Value Taken Time  BP 104/60 05/09/22 1249  Temp    Pulse 81 05/09/22 1251  Resp 23 05/09/22 1251  SpO2 92 % 05/09/22 1251  Vitals shown include unvalidated device data.  Last Pain:  Vitals:   05/09/22 0608  TempSrc:   PainSc: 5       Patients Stated Pain Goal: 2 (05/09/22 1975)  Complications: No notable events documented.

## 2022-05-09 NOTE — Brief Op Note (Signed)
05/09/2022  12:48 PM  PATIENT:  Melissa [name]  56 y.o. female  PRE-OPERATIVE DIAGNOSIS:  Degenerative slip with radiculopathy and stenosis L4-5  POST-OPERATIVE DIAGNOSIS:  Degenerative slip with radiculopathy and stenosis L4-5  PROCEDURE:  Procedure(s) with comments: OBLIQUE LUMBAR INTERBODY FUSION 1 LEVEL WITH PERCUTANEOUS SCREWS (OLIF L4-5 WITH POSTERIOR SPINAL FUSION INTERBODY) (N/A) - 4 hrs Dr. Chestine Spore to do approach Left tap block with exparel ABDOMINAL EXPOSURE (N/A)  SURGEON:  Surgeon(s) and Role: Panel 1:    Venita Lick, MD - Primary Panel 2:    Cephus Shelling, MD - Primary  PHYSICIAN ASSISTANT:   ASSISTANTS: none   ANESTHESIA:   general  EBL:  170 mL   BLOOD ADMINISTERED:none  DRAINS: none   LOCAL MEDICATIONS USED:  MARCAINE     SPECIMEN:  No Specimen  DISPOSITION OF SPECIMEN:  N/A  COUNTS:  YES  TOURNIQUET:  * No tourniquets in log *  DICTATION: .Dragon Dictation  PLAN OF CARE: Admit to inpatient   PATIENT DISPOSITION:  PACU - hemodynamically stable.

## 2022-05-09 NOTE — Anesthesia Postprocedure Evaluation (Signed)
Anesthesia Post Note  Patient: KAYLENN CIVIL  Procedure(s) Performed: OBLIQUE LUMBAR INTERBODY FUSION 1 LEVEL WITH PERCUTANEOUS SCREWS (OLIF L4-5 WITH POSTERIOR SPINAL FUSION INTERBODY) (Spine Lumbar) ABDOMINAL EXPOSURE     Patient location during evaluation: PACU Anesthesia Type: General Level of consciousness: sedated and patient cooperative Pain management: pain level controlled Vital Signs Assessment: post-procedure vital signs reviewed and stable Respiratory status: spontaneous breathing Cardiovascular status: stable Anesthetic complications: no   No notable events documented.  Last Vitals:  Vitals:   05/09/22 1345 05/09/22 1400  BP: 134/71 (!) 145/77  Pulse: 80 81  Resp: 13 17  Temp:  36.7 C  SpO2: 100% 100%    Last Pain:  Vitals:   05/09/22 1345  TempSrc:   PainSc: Asleep                 Lewie Loron

## 2022-05-09 NOTE — H&P (Signed)
History and Physical Interval Note:  05/09/2022 7:32 AM  Melissa [name]  has presented today for surgery, with the diagnosis of Degenerative slip with radiculopathy and stenosis L4-5.  The various methods of treatment have been discussed with the patient and family. After consideration of risks, benefits and other options for treatment, the patient has consented to  Procedure(s) with comments: OBLIQUE LUMBAR INTERBODY FUSION 1 LEVEL WITH PERCUTANEOUS SCREWS (OLIF L4-5 WITH POSTERIOR SPINAL FUSION INTERBODY) (N/A) - 4 hrs Dr. Chestine Spore to do approach Left tap block with exparel ABDOMINAL EXPOSURE (N/A) as a surgical intervention.  The patient's history has been reviewed, patient examined, no change in status, stable for surgery.  I have reviewed the patient's chart and labs.  Questions were answered to the patient's satisfaction.     Cephus Shelling  Patient name: Melissa [name]       MRN: [id number]        DOB: 1966-08-08          Sex: female   REASON FOR CONSULT: Evaluate for abdominal exposure for L4-L5 OLIF   HPI: Melissa [name] is a 56 y.o. female, with history of chronic lower back pain, hypertension, osteoarthritis that presents for evaluation of abdominal exposure for L4-L5 OLIF.  Patient states she has been in pain for at least the last several years with lower back pain.  She has been on disability and unable to work.  She has been evaluated by Dr. Shon Baton who has recommended an L4-L5 OLIF.  Vascular surgery was asked to assist with abdominal exposure.  She states her previous abdominal surgery includes a lower midline incision for a left kidney surgery when she was 56 years old.  No other hernia repairs with mesh in her abdominal wall.       Past Medical History:  Diagnosis Date   Allergic rhinitis     Anxiety     Arthritis     Back pain     Constipated     Depression     Diaphragmatic hernia     Fatty liver     GERD (gastroesophageal reflux disease)     Hypertension      Hypothyroidism     IBS (irritable bowel syndrome)     Joint pain     Kidney problem     Lactose intolerance     Morbid obesity (HCC)     OSA (obstructive sleep apnea)     Osteoarthritis     Seasonal allergies     Tinnitus     Vitamin D deficiency             Past Surgical History:  Procedure Laterality Date   CARPAL TUNNEL RELEASE        rt/lt   FINGER ARTHRODESIS   11/08/2011    Procedure: ARTHRODESIS FINGER;  Surgeon: Wyn Forster., MD;  Location: [facility];  Service: Orthopedics;  Laterality: Left;  left thumb carpal-metacarpal fusion   KNEE ARTHROSCOPY        both knees   PARTIAL KNEE ARTHROPLASTY   11/05/2012    Procedure: UNICOMPARTMENTAL KNEE;  Surgeon: [name], MD;  Location: [facility];  Service: Orthopedics;  Laterality: Right;   TONSILLECTOMY       TOTAL SHOULDER ARTHROPLASTY Right 06/25/2020    Procedure: TOTAL SHOULDER ARTHROPLASTY;  Surgeon: Yolonda Kida, MD;  Location: [facility];  Service: Orthopedics;  Laterality: Right;  2.5 hrs   URETER SURGERY  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-little lt kidney function, has one functioning kidney   WISDOM TOOTH EXTRACTION               Family History  Problem Relation Age of Onset   Arthritis Mother          spianl stenosis   Atrial fibrillation Mother     Transient ischemic attack Mother     High Cholesterol Mother     Diabetes Father     Heart attack Father          3 stents   Heart disease Father     Atrial fibrillation Sister        SOCIAL HISTORY: Social History         Socioeconomic History   Marital status: Single      Spouse name: Not on file   Number of children: Not on file   Years of education: Not on file   Highest education level: Not on file  Occupational History   Occupation: [employer]  Tobacco Use   Smoking status: Never   Smokeless tobacco: Never  Vaping Use   Vaping Use: Never used  Substance and Sexual Activity   Alcohol use: No      Comment: Quit  13 years ago   Drug use: Never   Sexual activity: Not on file  Other Topics Concern   Not on file  Social History Narrative    Lives home alone, (has dogs and cats).  Disabled, due to back.  Education 2 college degree's.  Coffee/ sodas 2-3 daily    Social Determinants of Health    Financial Resource Strain: Not on file  Food Insecurity: Not on file  Transportation Needs: Not on file  Physical Activity: Not on file  Stress: Not on file  Social Connections: Not on file  Intimate Partner Violence: Not on file          Allergies  Allergen Reactions   Bee Venom Hives            Current Outpatient Medications  Medication Sig Dispense Refill   acetaminophen (TYLENOL) 500 MG tablet Take 500-1,000 mg by mouth every 8 (eight) hours as needed (pain.).        Ascorbic Acid (VITAMIN C) 1000 MG tablet Take 3,000 mg by mouth at bedtime.       B Complex-C (B-COMPLEX WITH VITAMIN C) tablet Take 1 tablet by mouth at bedtime.        buPROPion (WELLBUTRIN XL) 300 MG 24 hr tablet Take 300 mg by mouth daily.       busPIRone (BUSPAR) 5 MG tablet Take 5 mg by mouth 2 (two) times daily as needed for anxiety.       chlorpheniramine (CHLOR-TRIMETON) 4 MG tablet Take 4 mg by mouth daily.       cholecalciferol (VITAMIN D3) 25 MCG (1000 UNIT) tablet Take 1,000 Units by mouth daily.       cyclobenzaprine (FLEXERIL) 10 MG tablet Take 10 mg by mouth at bedtime.       diphenhydrAMINE (BENADRYL) 25 mg capsule Take 1 capsule (25 mg total) by mouth every 6 (six) hours as needed for itching, allergies or sleep. (Patient taking differently: Take 50-75 mg by mouth daily as needed for itching, allergies or sleep.) 30 capsule     EPINEPHrine 0.3 mg/0.3 mL IJ SOAJ injection Inject 0.3 mg into the muscle once as needed (allergic reaction).        FLUoxetine (PROZAC) 40 MG capsule Take  40 mg by mouth daily.       HYDROcodone-acetaminophen (NORCO/VICODIN) 5-325 MG tablet Take 1 tablet by mouth every 6 (six) hours as needed  for moderate pain.       levothyroxine (SYNTHROID) 125 MCG tablet Take 125 mcg by mouth daily before breakfast.       Lidocaine (SALONPAS PAIN RELIEVING EX) Apply 1 Application topically daily as needed (pain).       LINZESS 145 MCG CAPS capsule Take 145 mcg by mouth daily.       meclizine (ANTIVERT) 25 MG tablet Take 25 mg by mouth 3 (three) times daily as needed for dizziness.       metFORMIN (GLUCOPHAGE) 500 MG tablet 2 tabs at breakfast 60 tablet 0   montelukast (SINGULAIR) 10 MG tablet Take 10 mg by mouth at bedtime.       Naphazoline-Pheniramine ([employer] EYE ALLERGY RELIEF OP) Place 1 drop into both eyes daily as needed (allergies).       omeprazole (PRILOSEC) 40 MG capsule Take 40 mg by mouth at bedtime.        pravastatin (PRAVACHOL) 80 MG tablet Take by mouth.       triamcinolone cream (KENALOG) 0.1 % Apply 1 application topically 2 (two) times daily as needed (rash).        aspirin EC 81 MG tablet Take 81 mg by mouth daily. (Patient not taking: Reported on 05/03/2022)       Semaglutide-Weight Management (WEGOVY) 0.25 MG/0.5ML SOAJ Inject 0.25 mg into the skin once a week. (Patient not taking: Reported on 04/27/2022) 2 mL 0    No current facility-administered medications for this visit.      REVIEW OF SYSTEMS:  [X]  denotes positive finding, [ ]  denotes negative finding Cardiac   Comments:  Chest pain or chest pressure:      Shortness of breath upon exertion:      Short of breath when lying flat:      Irregular heart rhythm:             Vascular      Pain in calf, thigh, or hip brought on by ambulation:      Pain in feet at night that wakes you up from your sleep:       Blood clot in your veins:      Leg swelling:              Pulmonary      Oxygen at home:      Productive cough:       Wheezing:              Neurologic      Sudden weakness in arms or legs:       Sudden numbness in arms or legs:       Sudden onset of difficulty speaking or slurred speech:      Temporary loss  of vision in one eye:       Problems with dizziness:              Gastrointestinal      Blood in stool:       Vomited blood:              Genitourinary      Burning when urinating:       Blood in urine:             Psychiatric      Major depression:  Hematologic      Bleeding problems:      Problems with blood clotting too easily:             Skin      Rashes or ulcers:             Constitutional      Fever or chills:          PHYSICAL EXAM:    Vitals:    05/03/22 1412  BP: 102/64  Pulse: 81  Resp: 16  Temp: (!) 97.4 F (36.3 C)  TempSrc: Temporal  SpO2: 96%  Weight: 218 lb (98.9 kg)  Height: 5\' 2"  (1.575 m)      GENERAL: The patient is a well-nourished female, in no acute distress. The vital signs are documented above. CARDIAC: There is a regular rate and rhythm.  VASCULAR:  Palpable femoral pulses bilaterally Palpable DP pulses bilaterally Lower midline incision well-healed PULMONARY: No respiratory distress.. ABDOMEN: Soft and non-tender. MUSCULOSKELETAL: There are no major deformities or cyanosis. NEUROLOGIC: No focal weakness or paresthesias are detected. SKIN: There are no ulcers or rashes noted. PSYCHIATRIC: The patient has a normal affect.   DATA:    MRI reviewed from 11/24/2021        Assessment/Plan:   56 y.o. female, with history of chronic lower back pain, hypertension, osteoarthritis that presents for evaluation of abdominal exposure for L4-L5 OLIF.  Discussed her being in the right lateral position and making an oblique incision over her left abdominal wall at the disc level and then entering the retroperitoneum to mobilize peritoneum and left ureter and subsequently mobilized left psoas to get to the L4-L5 disc space from an oblique approach.  Discussed possibly having to mobilize iliac vessels.  Risk of injury to the above structures also discussed.  Look forward to assisting Dr. 41 on Monday.  All questions answered.      Thursday, MD [facility] [place] Office: [phone]

## 2022-05-09 NOTE — Progress Notes (Unsigned)
Chief Complaint:   OBESITY Melissa [name] is here to discuss her progress with her obesity treatment plan along with follow-up of her obesity related diagnoses. Melissa [name] is on the Category 3 Plan and states she is following her eating plan approximately 80% of the time. Melissa [name] states she is doing 0 minutes 0 times per week.  Today's visit was #: 47 Starting weight: 256 lbs Starting date: 11/18/2019 Today's weight: 213 lbs Today's date: 05/05/2022 Total lbs lost to date: 43 Total lbs lost since last in-office visit: 3  Interim History: Scheduled for L4-L5 OUF on 05/09/2022 Dr. Shon Baton, and Dr. Chestine Spore. Pre-op labs completed on 05/04/2022, reviewed with the patient.  Expected recovery 6 weeks.  Melissa [name] has family/friend team to aid her in recovery.  Subjective:   1. Vitamin D deficiency Melissa [name] is currently on OTC vitamin D3 1000 units daily.  2. Pre-diabetes Melissa [name]'s preop labs yesterday on 05/04/2022, GFR> 60, blood glucose 122, elevated.  She is on metformin 500 mg 2 tablets at breakfast, and she is tolerating it well.  Her insurance will not cover GLP-1 therapy for obesity.  3. At risk for activity intolerance Melissa [name] is at risk of exercise intolerance due to chronic back pain. Scheduled for L4-L5 OUF on 05/09/2022.  Assessment/Plan:   1. Vitamin D deficiency We will check labs today, and we will follow-up at Melissa [name]'s next office visit.  - VITAMIN D 25 Hydroxy (Vit-D Deficiency, Fractures)  2. Pre-diabetes We will check labs today, and Melissa [name] will continue metformin 500 mg 2 tablets with breakfast, and we will refill for 90 days.   - metFORMIN (GLUCOPHAGE) 500 MG tablet; 2 tabs at breakfast  Dispense: 180 tablet; Refill: 0 - Hemoglobin A1c - Insulin, random  3. At risk for activity intolerance Melissa [name] was given approximately 15 minutes of exercise intolerance counseling today. She is 56 y.o. female and has risk factors exercise intolerance including obesity. We discussed intensive  lifestyle modifications today with an emphasis on specific weight loss instructions and strategies. Melissa [name] will slowly increase activity as tolerated.  Repetitive spaced learning was employed today to elicit superior memory formation and behavioral change.  4. Obesity, current BMI 39.1 Melissa [name] is currently in the action stage of change. As such, her goal is to continue with weight loss efforts. She has agreed to the Category 3 Plan.   Exercise goals: Post-op recovery.  Behavioral modification strategies: increasing lean protein intake, decreasing simple carbohydrates, meal planning and cooking strategies, keeping healthy foods in the home, and planning for success.  Melissa [name] has agreed to follow-up with our clinic in 8 weeks. She was informed of the importance of frequent follow-up visits to maximize her success with intensive lifestyle modifications for her multiple health conditions.   Melissa [name] was informed we would discuss her lab results at her next visit unless there is a critical issue that needs to be addressed sooner. Melissa [name] agreed to keep her next visit at the agreed upon time to discuss these results.  Objective:   Blood pressure 112/74, pulse 77, temperature 98.1 F (36.7 C), height 5\' 2"  (1.575 m), weight 213 lb (96.6 kg), last menstrual period 10/12/2012, SpO2 99 %. Body mass index is 38.96 kg/m.  General: Cooperative, alert, well developed, in no acute distress. HEENT: Conjunctivae and lids unremarkable. Cardiovascular: Regular rhythm.  Lungs: Normal work of breathing. Neurologic: No focal deficits.   Lab Results  Component Value Date   CREATININE 0.87 05/04/2022   BUN 18 05/04/2022   NA 140 05/04/2022  K 4.4 05/04/2022   CL 103 05/04/2022   CO2 27 05/04/2022   Lab Results  Component Value Date   ALT 19 01/11/2022   AST 16 01/11/2022   ALKPHOS 115 01/11/2022   BILITOT 0.2 01/11/2022   Lab Results  Component Value Date   HGBA1C 5.7 (H) 05/05/2022   HGBA1C 5.7  (H) 01/11/2022   HGBA1C 5.6 12/01/2021   HGBA1C 5.7 (H) 07/15/2021   HGBA1C 5.7 (H) 01/13/2021   Lab Results  Component Value Date   INSULIN 10.6 05/05/2022   INSULIN 8.3 01/11/2022   INSULIN 12.5 12/01/2021   INSULIN 8.8 07/15/2021   INSULIN 15.6 01/13/2021   Lab Results  Component Value Date   TSH 1.580 12/01/2021   Lab Results  Component Value Date   CHOL 207 (H) 01/11/2022   HDL 86 01/11/2022   LDLCALC 109 (H) 01/11/2022   TRIG 69 01/11/2022   CHOLHDL 2.4 01/11/2022   Lab Results  Component Value Date   VD25OH 61.5 05/05/2022   VD25OH 71.2 01/11/2022   VD25OH 102.0 (H) 12/01/2021   Lab Results  Component Value Date   WBC 5.9 05/04/2022   HGB 12.7 05/04/2022   HCT 39.6 05/04/2022   MCV 86.7 05/04/2022   PLT 356 05/04/2022   No results found for: "IRON", "TIBC", "FERRITIN"  Attestation Statements:   Reviewed by clinician on day of visit: allergies, medications, problem list, medical history, surgical history, family history, social history, and previous encounter notes.   Trude Mcburney, am acting as transcriptionist for [name], NP.  I have reviewed the above documentation for accuracy and completeness, and I agree with the above. -  ***

## 2022-05-09 NOTE — Op Note (Signed)
Date: May 09, 2022  Preoperative diagnosis: Chronic lower back pain  Postoperative diagnosis: Same  Procedure: Oblique spine exposure at the L4-L5 disc space via oblique retroperitoneal approach for L4-5 OLIF  Surgeon: Dr. Cephus Shelling, MD  Co-surgeon: Dr. Venita Lick, MD  Indications: 56 year old female with chronic lower back pain that has been evaluated by Dr. Shon Baton who has recommended an L4-L5 OLIF.  Vascular surgery was asked to assist with spine exposure.  She presents after risk benefits discussed.  Findings: Patient was placed in the right lateral position.  The L4-L5 disc space was marked over the left oblique muscles approximately 2 fingerbreadths off the left iliac crest.  Incision was made here and we performed a muscle-sparing technique between the layers of the oblique muscles to enter into the retroperitoneum and the peritoneum and left ureter were then mobilized out of the retroperitoneum and ultimately the left psoas and left iliac vessels were identified.  The left psoas muscle was mobilized lateral to the disc space.  One iliolumbar branch was ligated between clips and divided.  Ultimately fixed Globus retractor was placed on the field with exposure of the L4-L5 disc space from our oblique retroperitoneal approach.  We confirmed we were at the correct level with a spinal needle in the disc base at L4-L5.  Anesthesia: General  Details: Patient was taken to the operating room after informed consent was obtained.  Placed on the operative table in the right lateral position after general anesthesia was induced.  Pressure points were padded.  Ultimately fluoroscopic C-arm was used to mark the L4-5 disc space over the left oblique muscles.  This was marked 2 fingerbreadths off the left iliac crest.  The abdominal wall as well as the left flank and back were then prepped and draped in standard sterile fashion.  Antibiotics were given.  Timeout performed.  Initially made our  incision over our preoperative mark over the left oblique muscles and then dissected down with Bovie cautery through the subcutaneous tissue using cerebellar retractors for added visualization.  The external oblique fascia was then opened with Bovie cautery and performed a muscle-sparing technique between the layers of the oblique muscles until we identified the peritoneum that was then bluntly swept out of the retroperitoneum including the left ureter and we identified the left psoas and left iliac vessels.  Hand-held Wiley retractors were used for added visualization.  Blunt dissection was then performed with Kd and suction as well as laparoscopic dissector in order to mobilize the left psoas muscle lateral to the disc base.  Ultimately fixed globus retractor was placed on the field.  A 160 reverse lip was placed inferior to pull the peritoneum and left ureter as well as the left iliac vessels down and away from the disc space.  One iliolumbar branch was identified and ligated between clips.  We continued to mobilize the left psoas lateral to the disc space until we had good working room.  The other two arms of the retractor were placed on the field and we placed two 160 reverse lip retractors lateral to the disc space on the left side to get good exposure.  A needle was then placed in the disc space and we confirmed we were at the correct level on lateral fluoroscopy.  Case was turned over to Dr. Shon Baton.  Complication: None  Condition: Stable  Cephus Shelling, MD [facility] Office: [phone]   Cephus Shelling

## 2022-05-09 NOTE — Op Note (Signed)
OPERATIVE REPORT  DATE OF SURGERY: 05/09/2022  PATIENT NAME:  Melissa [name] MRN: [id number] DOB: Feb 25, 1966  PCP: Elizabeth Palau, FNP  PRE-OPERATIVE DIAGNOSIS: Degenerative spondylolisthesis L4-5 with radiculopathy  POST-OPERATIVE DIAGNOSIS: Same  PROCEDURE:   OLIF L4-5. Posterior spinal fusion instrumentation L4-5  SURGEON:  Venita Lick, MD Approach surgeon: Dr. Sherald Hess  PHYSICIAN ASSISTANT: None  ANESTHESIA:   General  EBL: 170 ml   Complications: None  Implants: Rise L cage from globus.  18 mm x 50 mm width.  8 to 15 mm height 6 degree lordosis.  Expanded from 8 mm to a final height of 14 mm. NuVasive MIS pedicle screw fixation.  L4: 6.5 x 45 mm length screws.  L5: 6.5 x 40 mm length screws.  40 mm length rods with 4 locking caps.  Graft: DBX mixed  Neuromonitoring: No adverse SSEP or free running EMG activity.  All 4 pedicle screws were directly stimulated and there is no adverse activity greater than 40 mA  BRIEF HISTORY: Melissa [name] is a 56 y.o. female who presents with significant back buttock and radicular leg pain.  Imaging studies demonstrated spondylolisthesis L4-5 spinal stenosis leading to nerve compression and irritation.  Despite appropriate conservative management her quality of life is continued to deteriorate and so we elected to move forward with surgery.  All appropriate risks, benefits, and alternatives to surgery were discussed and consent was obtained.  PROCEDURE DETAILS: Patient was brought into the operating room and was properly positioned on the operating room table.  After induction with general anesthesia the patient was endotracheally intubated.  A timeout was taken to confirm all important data: including patient, procedure, and the level. Teds, SCD's were applied.   A Foley was inserted, and the neuro monitoring representative applied all appropriate needles.  The patient was then turned into the lateral decubitus position  left side up.  Axillary roll was placed and all bony prominences well-padded.  Once the patient was properly positioned in the lateral dubious position she was then secured with tape to prevent her from moving.  Imaging studies confirmed satisfactory visualization of the L4-5 level in both the AP and lateral planes.  The patient was then prepped and draped in a standard fashion.  Dr. Chestine Spore scrubbed in at this time to perform a standard oblique retroperitoneal approach to the lumbar spine.  I was present for his assistant holding retractors.  Please refer to his dictation for specifics on the approach.  Once the retracting blades were properly positioned and the L4-5 level was exposed a needle was placed into the disc space and an x-ray was taken confirming that we are at the appropriate level.  Dr. Chestine Spore then scrubbed out and I proceeded with the surgery.  An annulotomy was performed with a 10 blade scalpel and then using a Cobb elevator I released the disc material from the endplates of L4 and L5.  I then crossed the contralateral annulus in order to facilitate the surgery.  I then used a box osteotome to remove the bulk of the disc material.  Using curettes, Kerrison rongeurs, pituitary rongeurs I remove the remainder of the disc material.  A fine nerve hook was passed posteriorly behind the posterior vertebral body of L4 and L5 and I used a small curved curette to release this posterior annulus.  I confirmed with x-ray that I was adequately decompressed.  I then used a rasp to create a bleeding subchondral bed for the implant.  I then used  the trial to determine the length and obtained the actual implant.  Implant was prepacked with DBX mixed and then gently inserted into the disc space.  Once I confirmed satisfactory position in both the AP and lateral planes I then expanded the cage from 8 mm up to 14 mm.  We had excellent contact with the endplates and satisfactory position.  The inserting device was  removed and I backfilled the cage.  I then placed an additional DBX mix anteriorly to the cage.  I irrigated the wound copiously normal saline and then confirmed satisfactory position of the intervertebral cage in both the AP and lateral planes.  The wound was copiously irrigated again and hemostasis was confirmed and I remove the retracting blades.  The fascia of the external oblique was reapproximated with a running #1 PDS suture stitch.  I then closed the remainder in a layered fashion with a running 0 Vicryl layer, interrupted 2-0 Vicryl sutures, and a 3-0 Monocryl for the skin.  At this point a intraoperative x-ray was taken and read by the radiologist and confirmed there was no retained surgical instruments in the wound.  At this point I then went to the posterior aspect of the patient in order to move forward with the pedicle screw fixation.  Using fluoroscopy I identified the L4-5 level again and properly visualize the L4 pedicle.  I then made a small stab incision and advanced the Jamshidi needle percutaneously to the lateral aspect of the L4 pedicle.  Once I was properly docked I then used the neuro monitoring device and directly advanced the Jamshidi needle using fluoroscopy and neuro monitoring.  As I neared the medial wall of the pedicle on the AP view I switched to the lateral view to confirm that I was just beyond the posterior wall the vertebral body.  At this point I was satisfied with the trajectory and advanced the Jamshidi needle into the L4 vertebral body.  I then placed the guidepin and removed the Jamshidi needle to cannulate the pedicle.  Using this exact same technique I cannulated the contralateral L4 pedicle.  With both L4 nerve pedicles cannulated I then used the same technique to cannulate the L5 pedicles bilaterally.  The 2 Wiltsie incisions were then made to connected to guidepins in order to facilitate the implantation of the rod.  Once I confirmed satisfactory position of all 4  guidepins I then measured and placed the 6.5 x 45 mm length screws at L4 and the 6.5 x 40 mm length screws at L5.  I used fluoroscopic guidance to advance the screw over the guidepin.  Once all 4 pedicle screws are in I then directly stimulated them and there is no adverse activity greater than 40 mA.  All 4 screws had excellent purchase.  I then measured and obtained the appropriate size rod and percutaneously placed the rod and then placed both locking caps.  All 4 locking caps were torqued and tightened according manufacture standards.  Final imaging demonstrated satisfactory position of the pedicle screw rod construct.  The insertion tabs were broken off and removed both wounds were copiously irrigated with normal saline.  They were closed in a layered fashion with interrupted #1 Vicryl suture, interrupted 0 Vicryl suture, interrupted 2-0 Vicryl suture and a 3-0 Monocryl for the skin.  Steri-Strips and dry dressings were applied.  Patient was ultimately extubated transfer the PACU without incident.  The end of the case all needle and sponge counts were correct.  There were  no adverse intraoperative events  Melina Schools, MD 05/09/2022 12:36 PM

## 2022-05-09 NOTE — Anesthesia Procedure Notes (Signed)
Procedure Name: Intubation Date/Time: 05/09/2022 8:15 AM  Performed by: Barrington Ellison, CRNAPre-anesthesia Checklist: Patient identified, Emergency Drugs available, Suction available and Patient being monitored Patient Re-evaluated:Patient Re-evaluated prior to induction Oxygen Delivery Method: Circle System Utilized Preoxygenation: Pre-oxygenation with 100% oxygen Induction Type: IV induction Ventilation: Mask ventilation without difficulty Laryngoscope Size: Mac and 3 Grade View: Grade I Tube type: Oral Tube size: 7.0 mm Number of attempts: 1 Airway Equipment and Method: Stylet and Oral airway Placement Confirmation: ETT inserted through vocal cords under direct vision, positive ETCO2 and breath sounds checked- equal and bilateral Secured at: 21 cm Tube secured with: Tape Dental Injury: Teeth and Oropharynx as per pre-operative assessment

## 2022-05-09 NOTE — Discharge Instructions (Signed)

## 2022-05-09 NOTE — Anesthesia Procedure Notes (Signed)
Anesthesia Regional Block: TAP block   Pre-Anesthetic Checklist: , timeout performed,  Correct Patient, Correct Site, Correct Laterality,  Correct Procedure, Correct Position, site marked,  Risks and benefits discussed,  Surgical consent,  Pre-op evaluation,  At surgeon's request and post-op pain management  Laterality: Left  Prep: chloraprep       Needles:  Injection technique: Single-shot  Needle Type: Echogenic Needle     Needle Length: 10cm  Needle Gauge: 21     Additional Needles:   Narrative:  Start time: 05/09/2022 7:05 AM End time: 05/09/2022 7:09 AM Injection made incrementally with aspirations every 5 mL.  Performed by: Personally  Anesthesiologist: Beryle Lathe, MD  Additional Notes: No pain on injection. No increased resistance to injection. Injection made in 5cc increments. Good needle visualization. Patient tolerated the procedure well.

## 2022-05-09 NOTE — H&P (Signed)
History: Melissa [name] presents today with ongoing severe back buttock and radicular leg pain despite injection therapy and formal physical therapy and self-directed water therapy. Patient states their pain level is 7-9/10. she states the pain significantly interferes with activities of daily living as well as overall quality of life.  As a result we have elected to move forward with the L4-5 fusion.   Past Medical History:  Diagnosis Date   Allergic rhinitis    Anxiety    Arthritis    Back pain    Constipated    Depression    Diaphragmatic hernia    Fatty liver    GERD (gastroesophageal reflux disease)    Hypertension    Hypothyroidism    IBS (irritable bowel syndrome)    Joint pain    Kidney problem    Lactose intolerance    Morbid obesity (HCC)    OSA (obstructive sleep apnea)    Osteoarthritis    Seasonal allergies    Tinnitus    Vitamin D deficiency     Allergies  Allergen Reactions   Bee Venom Hives    No current facility-administered medications on file prior to encounter.   Current Outpatient Medications on File Prior to Encounter  Medication Sig Dispense Refill   acetaminophen (TYLENOL) 500 MG tablet Take 500-1,000 mg by mouth every 8 (eight) hours as needed (pain.).      Ascorbic Acid (VITAMIN C) 1000 MG tablet Take 3,000 mg by mouth at bedtime.     aspirin EC 81 MG tablet Take 81 mg by mouth daily.     B Complex-C (B-COMPLEX WITH VITAMIN C) tablet Take 1 tablet by mouth at bedtime.      buPROPion (WELLBUTRIN XL) 300 MG 24 hr tablet Take 300 mg by mouth daily.     busPIRone (BUSPAR) 5 MG tablet Take 5 mg by mouth 2 (two) times daily as needed for anxiety.     chlorpheniramine (CHLOR-TRIMETON) 4 MG tablet Take 4 mg by mouth daily.     cholecalciferol (VITAMIN D3) 25 MCG (1000 UNIT) tablet Take 1,000 Units by mouth daily.     cyclobenzaprine (FLEXERIL) 10 MG tablet Take 10 mg by mouth at bedtime.     diphenhydrAMINE (BENADRYL) 25 mg capsule Take 1 capsule (25 mg  total) by mouth every 6 (six) hours as needed for itching, allergies or sleep. (Patient taking differently: Take 50-75 mg by mouth daily as needed for itching, allergies or sleep.) 30 capsule    EPINEPHrine 0.3 mg/0.3 mL IJ SOAJ injection Inject 0.3 mg into the muscle once as needed (allergic reaction).      FLUoxetine (PROZAC) 40 MG capsule Take 40 mg by mouth daily.     HYDROcodone-acetaminophen (NORCO/VICODIN) 5-325 MG tablet Take 1 tablet by mouth every 6 (six) hours as needed for moderate pain.     levothyroxine (SYNTHROID) 125 MCG tablet Take 125 mcg by mouth daily before breakfast.     Lidocaine (SALONPAS PAIN RELIEVING EX) Apply 1 Application topically daily as needed (pain).     LINZESS 145 MCG CAPS capsule Take 145 mcg by mouth daily.     meclizine (ANTIVERT) 25 MG tablet Take 25 mg by mouth 3 (three) times daily as needed for dizziness.     montelukast (SINGULAIR) 10 MG tablet Take 10 mg by mouth at bedtime.     Naphazoline-Pheniramine ([employer] EYE ALLERGY RELIEF OP) Place 1 drop into both eyes daily as needed (allergies).     omeprazole (PRILOSEC) 40 MG  capsule Take 40 mg by mouth at bedtime.      pravastatin (PRAVACHOL) 80 MG tablet Take by mouth.     triamcinolone cream (KENALOG) 0.1 % Apply 1 application topically 2 (two) times daily as needed (rash).       Physical Exam: Vitals:   05/09/22 0544  BP: 123/71  Pulse: 74  Resp: 18  Temp: 98.1 F (36.7 C)  SpO2: 98%   Body mass index is 39.14 kg/m.  Melissa [name] is a pleasant individual, who appears younger than their stated age.  She is alert and orientated 3.  No shortness of breath, chest pain.  Abdomen is soft and non-tender, negative loss of bowel and bladder control, no rebound tenderness.  Negative: skin lesions abrasions contusions  Peripheral pulses: 2+ dorsalis pedis/posterior tibialis pulses bilaterally. Compartment soft and nontender.  Gait pattern: Slightly altered gait pattern due to chronic back buttock and  knee pain  Assistive devices: None  Neuro: Positive numbness and dysesthesias into the lower extremity right worse than the left. Negative straight leg raise test. 5/5 motor strength in the lower extremity bilaterally. Negative Babinski test, no clonus, symmetrical 1+ deep tendon reflexes at the knee absent at the Achilles.  Musculoskeletal: Significant back pain with palpation and range of motion. No SI joint pain with direct palpation.  Imaging:  X-rays of the lumbar spine demonstrated degenerative spondylolisthesis at L4-5 with degenerative disc disease. No scoliosis is noted. No acute fracture is seen.  Lumbar MRI: completed on 11/24/2021:. Severe left neuroforaminal narrowing at L4-5 along with degenerative grade 1 anterior listhesis. Moderate to severe central stenosis. There is slight anterior degenerative listhesis at L3-4 producing moderate thecal sac compression centrally and mild foraminal and lateral recess stenosis. There is a minimal retrolisthesis at L2-3 and a slight anterior medially directed synovial cyst on the left side which is not causing significant neural compression. No fractures noted. There is significant bilateral facet arthrosis but no neuroforaminal narrowing at L5-S1.    A/P: Melissa [name] presents today with ongoing severe back buttock and radicular leg pain despite injection therapy and formal physical therapy and self-directed water therapy. Patient states their pain level is 7-9/10. she states the pain significantly interferes with activities of daily living as well as overall quality of life. Summary of Treatment Plan: 1. Diagnosis: Degenerative spondylolisthesis with foraminal and central stenosis L4-5 2. Tobacco use: [default value] 3. Conservative treatment: Physical therapy: Patient completed a recent course of physical therapy with no improvement in her quality of life or pain 4. Imaging studies: X-ray findings: Degenerative spondylolisthesis L4-5 grade  1. MRI/CT scan findings: Severe left neuroforaminal narrowing at L4-5 along with degenerative grade 1 anterior listhesis. Moderate to severe central stenosis. There is slight anterior degenerative listhesis at L3-4 producing moderate thecal sac compression centrally and mild foraminal and lateral recess stenosis. There is a minimal retrolisthesis at L2-3 and a slight anterior medially directed synovial cyst on the left side which is not causing significant neural compression. No fractures noted. There is significant bilateral facet arthrosis but no neuroforaminal narrowing at L5-S1value] 5. Surgical plan: OLIF L4-5 with supplemental posterior pedicle screw fixation 6. Implants: Manufacturer: Gl[employer]us expandable intervertebral cage. NuVasive MIS pedicle screw system Bone graft: osteocell OLIF/XLIF risks, benefits of surgery were reviewed with the patient. These include: infection, bleeding, death, stroke, paralysis, ongoing or worse pain, need for additional surgery, injury to the lumbar plexus resulting in hip flexor weakness and difficulty walking without assistive devices. Adjacent segment degenerative disease, need for additional surgery  including fusing other levels, leak of spinal fluid, Nonunion, hardware failure, breakage, or mal-position. Deep venous thrombosis (DVT) requiring additional treatment such as filter, and/or medications. Injury to abdominal contents, loss in bowel and bladder control. Risks and benefits of spinal fusion: Infection, bleeding, death, stroke, paralysis, ongoing or worse pain, need for additional surgery, nonunion, leak of spinal fluid, adjacent segment degeneration requiring additional fusion surgery, Injury to abdominal vessels that can require anterior surgery to stop bleeding. Malposition of the cage and/or pedicle screws that could require additional surgery. Loss of bowel and bladder control. Postoperative hematoma causing neurologic compression that could require urgent or  emergent re-operation.

## 2022-05-10 ENCOUNTER — Encounter (INDEPENDENT_AMBULATORY_CARE_PROVIDER_SITE_OTHER): Payer: Self-pay | Admitting: Adult Health

## 2022-05-10 ENCOUNTER — Encounter (HOSPITAL_COMMUNITY): Payer: Self-pay | Admitting: Orthopedic Surgery

## 2022-05-10 MED ORDER — HYDROMORPHONE HCL 1 MG/ML IJ SOLN: 1.0000 mg | Freq: Once | INTRAMUSCULAR | Status: DC

## 2022-05-10 MED ORDER — SORBITOL 70 % SOLN
60.0000 mL | Freq: Once | Status: AC
Start: 2022-05-11 — End: 2022-05-11
  Administered 2022-05-11: 30 mL via ORAL
  Filled 2022-05-10: qty 60

## 2022-05-10 MED FILL — Heparin Sodium (Porcine) Inj 1000 Unit/ML: INTRAMUSCULAR | Qty: 30 | Status: AC

## 2022-05-10 MED FILL — Thrombin (Recombinant) For Soln 20000 Unit: CUTANEOUS | Qty: 1 | Status: AC

## 2022-05-10 MED FILL — Sodium Chloride IV Soln 0.9%: INTRAVENOUS | Qty: 1000 | Status: AC

## 2022-05-10 NOTE — Plan of Care (Signed)
  Problem: Tissue Perfusion: Goal: Adequacy of tissue perfusion will improve Outcome: Completed/Met   Problem: Education: Goal: Ability to verbalize activity precautions or restrictions will improve Outcome: Completed/Met Goal: Knowledge of the prescribed therapeutic regimen will improve Outcome: Completed/Met Goal: Understanding of discharge needs will improve Outcome: Completed/Met   Problem: Activity: Goal: Ability to avoid complications of mobility impairment will improve Outcome: Completed/Met Goal: Ability to tolerate increased activity will improve Outcome: Completed/Met Goal: Will remain free from falls Outcome: Completed/Met   Problem: Clinical Measurements: Goal: Ability to maintain clinical measurements within normal limits will improve Outcome: Completed/Met Goal: Postoperative complications will be avoided or minimized Outcome: Completed/Met Goal: Diagnostic test results will improve Outcome: Completed/Met   Problem: Pain Management: Goal: Pain level will decrease Outcome: Completed/Met   Problem: Skin Integrity: Goal: Will show signs of wound healing Outcome: Completed/Met   Problem: Health Behavior/Discharge Planning: Goal: Identification of resources available to assist in meeting health care needs will improve Outcome: Completed/Met   Problem: Bladder/Genitourinary: Goal: Urinary functional status for postoperative course will improve Outcome: Completed/Met   Problem: Education: Goal: Ability to verbalize activity precautions or restrictions will improve Outcome: Completed/Met Goal: Knowledge of the prescribed therapeutic regimen will improve Outcome: Completed/Met Goal: Understanding of discharge needs will improve Outcome: Completed/Met   Problem: Activity: Goal: Ability to avoid complications of mobility impairment will improve Outcome: Completed/Met Goal: Ability to tolerate increased activity will improve Outcome: Completed/Met Goal: Will  remain free from falls Outcome: Completed/Met   Problem: Bowel/Gastric: Goal: Gastrointestinal status for postoperative course will improve Outcome: Completed/Met   Problem: Clinical Measurements: Goal: Ability to maintain clinical measurements within normal limits will improve Outcome: Completed/Met Goal: Postoperative complications will be avoided or minimized Outcome: Completed/Met Goal: Diagnostic test results will improve Outcome: Completed/Met   Problem: Pain Management: Goal: Pain level will decrease Outcome: Completed/Met   Problem: Skin Integrity: Goal: Will show signs of wound healing Outcome: Completed/Met   Problem: Health Behavior/Discharge Planning: Goal: Identification of resources available to assist in meeting health care needs will improve Outcome: Completed/Met   Problem: Bladder/Genitourinary: Goal: Urinary functional status for postoperative course will improve Outcome: Completed/Met

## 2022-05-10 NOTE — Progress Notes (Signed)
    Subjective: Procedure(s) (LRB): OBLIQUE LUMBAR INTERBODY FUSION 1 LEVEL WITH PERCUTANEOUS SCREWS (OLIF L4-5 WITH POSTERIOR SPINAL FUSION INTERBODY) (N/A) ABDOMINAL EXPOSURE (N/A) 1 Day Post-Op  Patient reports pain as 3 on 0-10 scale.  Reports decreased leg pain reports incisional back pain   Positive void Negative bowel movement Positive flatus Negative chest pain or shortness of breath  Objective: Vital signs in last 24 hours: Temp:  [98 F (36.7 C)-98.9 F (37.2 C)] 98.2 F (36.8 C) (07/18 0734) Pulse Rate:  [75-94] 93 (07/18 0734) Resp:  [13-24] 16 (07/18 0734) BP: (103-159)/(60-94) 105/60 (07/18 0734) SpO2:  [93 %-100 %] 100 % (07/18 0734)  Intake/Output from previous day: 07/17 0701 - 07/18 0700 In: 2000 [I.V.:2000] Out: 670 [Urine:500; Blood:170]  Labs: No results for input(s): "WBC", "RBC", "HCT", "PLT" in the last 72 hours. No results for input(s): "NA", "K", "CL", "CO2", "BUN", "CREATININE", "GLUCOSE", "CALCIUM" in the last 72 hours. No results for input(s): "LABPT", "INR" in the last 72 hours.  Physical Exam: Neurologically intact ABD soft Intact pulses distally Incision: dressing C/D/I and no drainage Compartment soft Body mass index is 39.14 kg/m.   Assessment/Plan: Patient stable  xrays n/a Continue mobilization with physical therapy Continue care  Advance diet Up with therapy Overall patient doing well.  Continue mobilization with physical therapy.  Plan on discharge to home tomorrow.  Venita Lick, MD [facility] [phone]

## 2022-05-10 NOTE — Progress Notes (Signed)
[facility]  Subjective  -no nausea or vomiting.   Objective 105/60 93 98.2 F (36.8 C) (Oral) 16 100%  Intake/Output Summary (Last 24 hours) at 05/10/2022 2633 Last data filed at 05/09/2022 1400 Gross per 24 hour  Intake 2000 ml  Output 670 ml  Net 1330 ml    Abdomen soft with appropriate postop incisional tenderness  Laboratory Lab Results: No results for input(s): "WBC", "HGB", "HCT", "PLT" in the last 72 hours. BMET No results for input(s): "NA", "K", "CL", "CO2", "GLUCOSE", "BUN", "CREATININE", "CALCIUM" in the last 72 hours.  COAG Lab Results  Component Value Date   INR 0.90 10/26/2012   No results found for: "PTT"  Assessment/Planning:  Postop day 1 status post spine exposure for L4-L5 OLIF.  Appropriate postop incisional tenderness.  Tolerating p.o. without nausea or vomiting.  Left DP palpable.  Looks good from a vascular standpoint.  Cephus Shelling 05/10/2022 8:22 AM --

## 2022-05-10 NOTE — Evaluation (Signed)
Physical Therapy Evaluation and Discharge  Patient Details Name: Melissa [name] MRN: [id number] DOB: 1966/04/21 Today's Date: 05/10/2022  History of Present Illness  Pt is a 56 y/o female who presents s/p L4-L5 OLIF and PSF on 05/09/2022. PMH significant for HTN, hypothyroidism, OA, R partial knee arthroplasty, R total shoulder arthroplasty.   Clinical Impression  Patient evaluated by Physical Therapy with no further acute PT needs identified. All education has been completed and the patient has no further questions. Pt was able to demonstrate transfers and ambulation with gross modified independence and no AD. Pt was educated on precautions, brace application/wearing schedule, appropriate activity progression, and car transfer. Brace adjusted for optimal fit. See below for any follow-up Physical Therapy or equipment needs. PT is signing off. Thank you for this referral.        Recommendations for follow up therapy are one component of a multi-disciplinary discharge planning process, led by the attending physician.  Recommendations may be updated based on patient status, additional functional criteria and insurance authorization.  Follow Up Recommendations No PT follow up      Assistance Recommended at Discharge PRN  Patient can return home with the following  Assistance with cooking/housework;Assist for transportation;Help with stairs or ramp for entrance    Equipment Recommendations None recommended by PT  Recommendations for Other Services       Functional Status Assessment Patient has had a recent decline in their functional status and demonstrates the ability to make significant improvements in function in a reasonable and predictable amount of time.     Precautions / Restrictions Precautions Precautions: Fall;Back Precaution Booklet Issued: Yes (comment) Precaution Comments: Reviewed handout and pt was cued for precautions during functional mobility. Required Braces or  Orthoses: Spinal Brace Spinal Brace: Lumbar corset;Applied in sitting position Restrictions Weight Bearing Restrictions: No      Mobility  Bed Mobility               General bed mobility comments: Pt was received sitting up in the recliner chair.    Transfers Overall transfer level: Modified independent Equipment used: None               General transfer comment: Good posture and pt demonstrated proper hand placement on seated surface for safety. No assist required and no unsteadiness/LOB noted.    Ambulation/Gait Ambulation/Gait assistance: Modified independent (Device/Increase time) Gait Distance (Feet): 500 Feet Assistive device: None Gait Pattern/deviations: Step-through pattern, Decreased stride length, Trunk flexed, Wide base of support Gait velocity: Decreased Gait velocity interpretation: 1.31 - 2.62 ft/sec, indicative of limited community ambulator   General Gait Details: Pt maintainted good posture throughout ambulation. No assist required and no unsteadiness noted. Noted wide BOS and decreased knee flexion throughout gait cycle but likely due to reported baseline knee deficits.  Stairs Stairs: Yes Stairs assistance: Min guard Stair Management: One rail Left, Step to pattern, Forwards Number of Stairs: 4 General stair comments: VC's for sequencing and general safety. No assist required but close guard provided for safety.  Wheelchair Mobility    Modified Rankin (Stroke Patients Only)       Balance Overall balance assessment: Needs assistance Sitting-balance support: No upper extremity supported, Feet supported Sitting balance-Leahy Scale: Good     Standing balance support: No upper extremity supported, During functional activity Standing balance-Leahy Scale: Fair  Pertinent Vitals/Pain Pain Assessment Pain Assessment: Faces Faces Pain Scale: Hurts a little bit Pain Location: Back Pain  Descriptors / Indicators: Operative site guarding Pain Intervention(s): Limited activity within patient's tolerance, Monitored during session, Repositioned    Home Living Family/patient expects to be discharged to:: Private residence Living Arrangements: Alone Available Help at Discharge: Friend(s);Family;Available 24 hours/day Type of Home: House Home Access: Stairs to enter Entrance Stairs-Rails: Doctor, general practice of Steps: 4   Home Layout: One level Home Equipment: Cane - single point (Hurry-canes) Additional Comments: Mom coming to stay with her, and has friends/neighbors to assist her as well as needed    Prior Function Prior Level of Function : Independent/Modified Independent             Mobility Comments: 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 [employer]   Dominant Hand: Right    Extremity/Trunk Assessment   Upper Extremity Assessment Upper Extremity Assessment: Overall WFL for tasks assessed    Lower Extremity Assessment Lower Extremity Assessment: Generalized weakness (Mild; consistent with pre-op diagnosis)    Cervical / Trunk Assessment Cervical / Trunk Assessment: Back Surgery  Communication   Communication: No difficulties  Cognition Arousal/Alertness: Awake/alert Behavior During Therapy: WFL for tasks assessed/performed Overall Cognitive Status: Within Functional Limits for tasks assessed                                          General Comments      Exercises     Assessment/Plan    PT Assessment Patient does not need any further PT services  PT Problem List         PT Treatment Interventions      PT Goals (Current goals can be found in the Care Plan section)  Acute Rehab PT Goals 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 PT Goal Formulation: All assessment and education complete, DC therapy    Frequency        Co-evaluation               AM-PAC PT "6 Clicks" Mobility  Outcome Measure Help needed turning from your back to your side while in a flat bed without using bedrails?: None Help needed moving from lying on your back to sitting on the side of a flat bed without using bedrails?: None Help needed moving to and from a bed to a chair (including a wheelchair)?: None Help needed standing up from a chair using your arms (e.g., wheelchair or bedside chair)?: None Help needed to walk in hospital room?: None Help needed climbing 3-5 steps with a railing? : A Little 6 Click Score: 23    End of Session Equipment Utilized During Treatment: Back brace Activity Tolerance: Patient tolerated treatment well Patient left: in chair;with call bell/phone within reach Nurse Communication: Mobility status PT Visit Diagnosis: Unsteadiness on feet (R26.81);Pain Pain - part of body:  (back)    Time: 7939-0300 PT Time Calculation (min) (ACUTE ONLY): 25 min   Charges:   PT Evaluation $PT Eval Low Complexity: 1 Low PT Treatments $Gait Training: 8-22 mins        Conni Slipper, PT, DPT Acute Rehabilitation Services Secure Chat Preferred Office: [phone]   Marylynn Pearson 05/10/2022, 10:26 AM

## 2022-05-10 NOTE — Progress Notes (Signed)
Occupational Therapy Evaluation Patient Details Name: Melissa [name] MRN: [id number] DOB: 16-Oct-1966 Today's Date: 05/10/2022   History of Present Illness Pt is a 56 y/o female who presents s/p L4-L5 OLIF and PSF on 05/09/2022. PMH significant for HTN, hypothyroidism, OA, R partial knee arthroplasty, R total shoulder arthroplasty.   Clinical Impression   PTA, pt lived alone, was independent with ADL and IADL and was driving. Following initial education, pt performing LB dressing with mod I for increased time and use of AE. Pt performing toilet transfer and pericare with mod I during session. Pt required min A for brace application to achieve correct placement and fit. Pt reports mother will be staying with her when she returns home as well as multiple friends available to help. Recommend d/c home with no follow up OT. OT to sign off. Re-consult if change in status.   Recommendations for follow up therapy are one component of a multi-disciplinary discharge planning process, led by the attending physician.  Recommendations may be updated based on patient status, additional functional criteria and insurance authorization.   Follow Up Recommendations  No OT follow up    Assistance Recommended at Discharge PRN  Patient can return home with the following Assistance with cooking/housework    Functional Status Assessment     Equipment Recommendations  BSC/3in1    Recommendations for Other Services       Precautions / Restrictions Precautions Precautions: Fall;Back Precaution Booklet Issued: Yes (comment) Precaution Comments: Reviewed handout and pt was cued for precautions during functional mobility. Required Braces or Orthoses: Spinal Brace Spinal Brace: Lumbar corset;Applied in sitting position Restrictions Weight Bearing Restrictions: No      Mobility Bed Mobility Overal bed mobility: Modified Independent             General bed mobility comments: Pt performing log roll  upon therapist entry into room.    Transfers Overall transfer level: Modified independent Equipment used: None               General transfer comment: Good posture and pt demonstrated proper hand placement on seated surface for safety. No assist required and no unsteadiness/LOB noted.      Balance Overall balance assessment: Needs assistance Sitting-balance support: No upper extremity supported, Feet supported Sitting balance-Leahy Scale: Good Sitting balance - Comments: Pt attempting to don socks with no LOB.   Standing balance support: No upper extremity supported, During functional activity Standing balance-Leahy Scale: Fair                             ADL either performed or assessed with clinical judgement   ADL Overall ADL's : Needs assistance/impaired                 Upper Body Dressing : Minimal assistance;Sitting Upper Body Dressing Details (indicate cue type and reason): MIn A to don brace                   General ADL Comments: Following initial education for use of compensatory techniques, pt performing LB dressing, toilet transfer with mod I. Pt performing LB dressing with use of AE.Pt performing brace application with Min A for initial education and placement of brace.     Vision Patient Visual Report: No change from baseline (no visual deficits noted) Vision Assessment?: No apparent visual deficits     Perception     Praxis      Pertinent Vitals/Pain Pain  Assessment Pain Assessment: Faces Faces Pain Scale: Hurts a little bit Pain Location: Back Pain Descriptors / Indicators: Operative site guarding Pain Intervention(s): Monitored during session, Limited activity within patient's tolerance     Hand Dominance Right   Extremity/Trunk Assessment Upper Extremity Assessment Upper Extremity Assessment: Defer to OT evaluation   Lower Extremity Assessment Lower Extremity Assessment: Defer to PT evaluation   Cervical /  Trunk Assessment Cervical / Trunk Assessment: Back Surgery   Communication Communication Communication: No difficulties   Cognition Arousal/Alertness: Awake/alert Behavior During Therapy: WFL for tasks assessed/performed Overall Cognitive Status: Within Functional Limits for tasks assessed                                 General Comments: Pt with skill to recall 3/3 back precautions with one verbal cue     General Comments  VSS    Exercises     Shoulder Instructions      Home Living Family/patient expects to be discharged to:: Private residence Living Arrangements: Alone Available Help at Discharge: Friend(s);Family;Available 24 hours/day Type of Home: House Home Access: Stairs to enter [employer] of Steps: 4 Entrance Stairs-Rails: Right;Left Home Layout: One level     Bathroom Shower/Tub: Producer, television/film/video: Handicapped height Bathroom Accessibility: Yes   Home Equipment: Cane - single point (Hurry-canes)   Additional Comments: Mom coming to stay with her, and has friends/neighbors to assist her as well as needed. Pt has sveral dogs in the home, however, neighbors and friends will be assisting with pet care and housing during recovery.      Prior Functioning/Environment Prior Level of Function : Independent/Modified Independent             Mobility Comments: Used to work at [employer] for [employer]. 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 ADLs Comments: Pt reports independence with ADL, IADL, and driving prior to spinal surgery        OT Problem List: Decreased strength;Decreased activity tolerance;Decreased knowledge of use of DME or AE;Decreased knowledge of precautions;Pain      OT Treatment/Interventions:      OT Goals(Current goals can be found in the care plan section) Acute Rehab OT Goals Patient Stated Goal: Go home and be better OT Goal Formulation: With patient  OT Frequency:      Co-evaluation               AM-PAC OT "6 Clicks" Daily Activity     Outcome Measure Help from another person eating meals?: None Help from another person taking care of personal grooming?: None Help from another person toileting, which includes using toliet, bedpan, or urinal?: None Help from another person bathing (including washing, rinsing, drying)?: None Help from another person to put on and taking off regular upper body clothing?: None Help from another person to put on and taking off regular lower body clothing?: None 6 Click Score: 24   End of Session Equipment Utilized During Treatment: Back brace Nurse Communication: Mobility status  Activity Tolerance: Patient tolerated treatment well Patient left: in chair;with call bell/phone within reach  OT Visit Diagnosis: Unsteadiness on feet (R26.81);Muscle weakness (generalized) (M62.81);Pain Pain - part of body:  (Back)                Time: 7793-9030 OT Time Calculation (min): 25 min Charges:  OT General Charges $OT Visit: 1 Visit OT Evaluation $OT Eval Low Complexity: 1 Low  OT Treatments $Self Care/Home Management : 8-22 mins  Ladene Artist, OTR/L [facility] Acute Rehabilitation Office: [phone]   Drue Novel 05/10/2022, 12:21 PM

## 2022-05-11 ENCOUNTER — Encounter (INDEPENDENT_AMBULATORY_CARE_PROVIDER_SITE_OTHER): Payer: Self-pay | Admitting: Adult Health

## 2022-05-11 MED ORDER — BISACODYL 10 MG RE SUPP
10.0000 mg | Freq: Once | RECTAL | Status: AC
  Administered 2022-05-11: 10 mg via RECTAL
  Filled 2022-05-11: qty 1

## 2022-05-11 NOTE — Discharge Summary (Signed)
Patient ID: Melissa [name] MRN: [id number] DOB/AGE: November 19, 1965 56 y.o.  Admit date: 05/09/2022 Discharge date: 05/11/2022  Admission Diagnoses:  Principal Problem:   S/P lumbar fusion   Discharge Diagnoses:  Principal Problem:   S/P lumbar fusion  status post Procedure(s): OBLIQUE LUMBAR INTERBODY FUSION 1 LEVEL WITH PERCUTANEOUS SCREWS (OLIF L4-5 WITH POSTERIOR SPINAL FUSION INTERBODY) ABDOMINAL EXPOSURE  Past Medical History:  Diagnosis Date   Allergic rhinitis    Anxiety    Arthritis    Back pain    Constipated    Depression    Diaphragmatic hernia    Fatty liver    GERD (gastroesophageal reflux disease)    Hypertension    Hypothyroidism    IBS (irritable bowel syndrome)    Joint pain    Kidney problem    Lactose intolerance    Morbid obesity (HCC)    OSA (obstructive sleep apnea)    Osteoarthritis    Seasonal allergies    Tinnitus    Vitamin D deficiency     Surgeries: Procedure(s): OBLIQUE LUMBAR INTERBODY FUSION 1 LEVEL WITH PERCUTANEOUS SCREWS (OLIF L4-5 WITH POSTERIOR SPINAL FUSION INTERBODY) ABDOMINAL EXPOSURE on 05/09/2022   Consultants: Treatment Team:  Cephus Shelling, MD  Discharged Condition: Improved  Hospital Course: Melissa [name] is an 56 y.o. female who was admitted 05/09/2022 for operative treatment of S/P lumbar fusion. Patient failed conservative treatments (please see the history and physical for the specifics) and had severe unremitting pain that affects sleep, daily activities and work/hobbies. After pre-op clearance, the patient was taken to the operating room on 05/09/2022 and underwent  Procedure(s): OBLIQUE LUMBAR INTERBODY FUSION 1 LEVEL WITH PERCUTANEOUS SCREWS (OLIF L4-5 WITH POSTERIOR SPINAL FUSION INTERBODY) ABDOMINAL EXPOSURE.    Patient was given perioperative antibiotics:  Anti-infectives (From admission, onward)    Start     Dose/Rate Route Frequency Ordered Stop   05/09/22 1515  ceFAZolin (ANCEF) IVPB 1 g/50 mL  premix        1 g 100 mL/hr over 30 Minutes Intravenous Every 8 hours 05/09/22 1427 05/09/22 2238   05/09/22 0559  ceFAZolin (ANCEF) 2-4 GM/100ML-% IVPB       Note to Pharmacy: Kandice Hams D: cabinet override      05/09/22 0559 05/09/22 0905   05/09/22 0544  ceFAZolin (ANCEF) IVPB 2g/100 mL premix        2 g 200 mL/hr over 30 Minutes Intravenous 30 min pre-op 05/09/22 0544 05/09/22 0816        Patient was given sequential compression devices and early ambulation to prevent DVT.   Patient benefited maximally from hospital stay and there were no complications. At the time of discharge, the patient was urinating/moving their bowels without difficulty, tolerating a regular diet, pain is controlled with oral pain medications and they have been cleared by PT/OT.   Recent vital signs: Patient Vitals for the past 24 hrs:  BP Temp Temp src Pulse Resp SpO2  05/11/22 0434 (!) 108/55 98.2 F (36.8 C) Oral 83 20 100 %  05/10/22 2343 111/63 97.9 F (36.6 C) Oral 77 20 100 %  05/10/22 1935 104/65 98.5 F (36.9 C) Oral 77 18 100 %  05/10/22 1611 (!) 103/54 98.5 F (36.9 C) Oral 83 16 100 %  05/10/22 1143 108/62 98.8 F (37.1 C) Oral 84 16 98 %     Recent laboratory studies: No results for input(s): "WBC", "HGB", "HCT", "PLT", "NA", "K", "CL", "CO2", "BUN", "CREATININE", "GLUCOSE", "INR", "CALCIUM" in the  last 72 hours.  Invalid input(s): "PT", "2"   Discharge Medications:   Allergies as of 05/11/2022       Reactions   Bee Venom Hives   Lactose Intolerance (gi)         Medication List     STOP taking these medications    acetaminophen 500 MG tablet Commonly known as: TYLENOL   aspirin EC 81 MG tablet   cyclobenzaprine 10 MG tablet Commonly known as: FLEXERIL   HYDROcodone-acetaminophen 5-325 MG tablet Commonly known as: NORCO/VICODIN   SALONPAS PAIN RELIEVING EX   triamcinolone cream 0.1 % Commonly known as: KENALOG       TAKE these medications    B-complex  with vitamin C tablet Take 1 tablet by mouth at bedtime.   buPROPion 300 MG 24 hr tablet Commonly known as: WELLBUTRIN XL Take 300 mg by mouth daily.   busPIRone 5 MG tablet Commonly known as: BUSPAR Take 5 mg by mouth 2 (two) times daily as needed for anxiety.   chlorpheniramine 4 MG tablet Commonly known as: CHLOR-TRIMETON Take 4 mg by mouth daily.   cholecalciferol 25 MCG (1000 UNIT) tablet Commonly known as: VITAMIN D3 Take 1,000 Units by mouth daily.   [employer] EYE ALLERGY RELIEF OP Place 1 drop into both eyes daily as needed (allergies).   diphenhydrAMINE 25 mg capsule Commonly known as: BENADRYL Take 1 capsule (25 mg total) by mouth every 6 (six) hours as needed for itching, allergies or sleep. What changed:  how much to take when to take this   EPINEPHrine 0.3 mg/0.3 mL Soaj injection Commonly known as: EPI-PEN Inject 0.3 mg into the muscle once as needed (allergic reaction).   FLUoxetine 40 MG capsule Commonly known as: PROZAC Take 40 mg by mouth daily.   levothyroxine 125 MCG tablet Commonly known as: SYNTHROID Take 125 mcg by mouth daily before breakfast.   Linzess 145 MCG Caps capsule Generic drug: linaclotide Take 145 mcg by mouth daily.   meclizine 25 MG tablet Commonly known as: ANTIVERT Take 25 mg by mouth 3 (three) times daily as needed for dizziness.   metFORMIN 500 MG tablet Commonly known as: GLUCOPHAGE 2 tabs at breakfast   methocarbamol 500 MG tablet Commonly known as: ROBAXIN Take 1 tablet (500 mg total) by mouth every 8 (eight) hours as needed for up to 5 days for muscle spasms.   montelukast 10 MG tablet Commonly known as: SINGULAIR Take 10 mg by mouth at bedtime.   omeprazole 40 MG capsule Commonly known as: PRILOSEC Take 40 mg by mouth at bedtime.   ondansetron 4 MG tablet Commonly known as: Zofran Take 1 tablet (4 mg total) by mouth every 8 (eight) hours as needed for nausea or vomiting.   oxyCODONE-acetaminophen 10-325 MG  tablet Commonly known as: Percocet Take 1 tablet by mouth every 6 (six) hours as needed for up to 5 days for pain.   pravastatin 80 MG tablet Commonly known as: PRAVACHOL Take by mouth.   vitamin C 1000 MG tablet Take 3,000 mg by mouth at bedtime.        Diagnostic Studies: DG Lumbar Spine 2-3 Views  Result Date: 05/09/2022 CLINICAL DATA:  Surgical fusion of L4-5. EXAM: LUMBAR SPINE - 2-3 VIEW; DG C-ARM 1-60 MIN-NO REPORT Radiation exposure index: 238.3 mGy. COMPARISON:  None Available. FINDINGS: Two intraoperative fluoroscopic images were obtained of the lumbar spine. These demonstrate the patient be status post surgical posterior fusion of L4-5 with bilateral intrapedicular screw placement and  interbody fusion. IMPRESSION: Fluoroscopic guidance provided during surgical posterior fusion of L4-5. Electronically Signed   By: Lupita Raider M.D.   On: 05/09/2022 12:36   DG C-Arm 1-60 Min-No Report  Result Date: 05/09/2022 CLINICAL DATA:  Surgical fusion of L4-5. EXAM: LUMBAR SPINE - 2-3 VIEW; DG C-ARM 1-60 MIN-NO REPORT Radiation exposure index: 238.3 mGy. COMPARISON:  None Available. FINDINGS: Two intraoperative fluoroscopic images were obtained of the lumbar spine. These demonstrate the patient be status post surgical posterior fusion of L4-5 with bilateral intrapedicular screw placement and interbody fusion. IMPRESSION: Fluoroscopic guidance provided during surgical posterior fusion of L4-5. Electronically Signed   By: Lupita Raider M.D.   On: 05/09/2022 12:36   DG C-Arm 1-60 Min-No Report  Result Date: 05/09/2022 CLINICAL DATA:  Surgical fusion of L4-5. EXAM: LUMBAR SPINE - 2-3 VIEW; DG C-ARM 1-60 MIN-NO REPORT Radiation exposure index: 238.3 mGy. COMPARISON:  None Available. FINDINGS: Two intraoperative fluoroscopic images were obtained of the lumbar spine. These demonstrate the patient be status post surgical posterior fusion of L4-5 with bilateral intrapedicular screw placement and  interbody fusion. IMPRESSION: Fluoroscopic guidance provided during surgical posterior fusion of L4-5. Electronically Signed   By: Lupita Raider M.D.   On: 05/09/2022 12:36   DG C-Arm 1-60 Min-No Report  Result Date: 05/09/2022 CLINICAL DATA:  Surgical fusion of L4-5. EXAM: LUMBAR SPINE - 2-3 VIEW; DG C-ARM 1-60 MIN-NO REPORT Radiation exposure index: 238.3 mGy. COMPARISON:  None Available. FINDINGS: Two intraoperative fluoroscopic images were obtained of the lumbar spine. These demonstrate the patient be status post surgical posterior fusion of L4-5 with bilateral intrapedicular screw placement and interbody fusion. IMPRESSION: Fluoroscopic guidance provided during surgical posterior fusion of L4-5. Electronically Signed   By: Lupita Raider M.D.   On: 05/09/2022 12:36   DG C-Arm 1-60 Min-No Report  Result Date: 05/09/2022 CLINICAL DATA:  Surgical fusion of L4-5. EXAM: LUMBAR SPINE - 2-3 VIEW; DG C-ARM 1-60 MIN-NO REPORT Radiation exposure index: 238.3 mGy. COMPARISON:  None Available. FINDINGS: Two intraoperative fluoroscopic images were obtained of the lumbar spine. These demonstrate the patient be status post surgical posterior fusion of L4-5 with bilateral intrapedicular screw placement and interbody fusion. IMPRESSION: Fluoroscopic guidance provided during surgical posterior fusion of L4-5. Electronically Signed   By: Lupita Raider M.D.   On: 05/09/2022 12:36   DG OR LOCAL ABDOMEN  Result Date: 05/09/2022 CLINICAL DATA:  Final instrument count. EXAM: OR LOCAL ABDOMEN COMPARISON:  None Available. FINDINGS: Status post surgical fusion of L4-5. Surgical staples are seen in the upper abdomen bilaterally. No other radiopaque foreign body is noted. IMPRESSION: Status post surgical fusion of L4-5. Surgical staples are seen in the upper abdomen bilaterally. No other radiopaque foreign body is noted. These results were called by telephone at the time of interpretation on 05/09/2022 at 10:45 am to  provider Catrina in the OR, who verbally acknowledged these results. Electronically Signed   By: Lupita Raider M.D.   On: 05/09/2022 10:45    Discharge Instructions     Incentive spirometry RT   Complete by: As directed         Follow-up Information     Venita Lick, MD Follow up in 2 week(s).   Specialty: Orthopedic Surgery Why: If symptoms worsen, For suture removal, For wound re-check Contact information: [address] [place] [address] [phone]                 Discharge  Plan:  discharge to   Disposition: Patient doing well overall.  Ambulating independently and pain is well controlled.  Plan on d/c to home with mild self directed exercises and oral pain medications.  She was provided all appropriate instructions and will f/u with me in 2 weeks.    Signed: Alvy Beal for Dr. Venita Lick [facility] [phone] 05/11/2022, 9:04 AM

## 2022-05-11 NOTE — Progress Notes (Signed)
Patient alert and oriented, voiding adequately, skin clean, dry and intact without evidence of skin break down, or symptoms of complications - no redness or edema noted, only slight tenderness at site.  Patient states pain is manageable at time of discharge. Patient has an appointment with MD in 2 weeks 

## 2022-05-11 NOTE — Progress Notes (Signed)
    Subjective: Procedure(s) (LRB): OBLIQUE LUMBAR INTERBODY FUSION 1 LEVEL WITH PERCUTANEOUS SCREWS (OLIF L4-5 WITH POSTERIOR SPINAL FUSION INTERBODY) (N/A) ABDOMINAL EXPOSURE (N/A) 2 Days Post-Op  Patient reports pain as 2 on 0-10 scale.  Reports decreased leg pain reports incisional back pain   Positive void Positive bowel movement Positive flatus Negative chest pain or shortness of breath  Objective: Vital signs in last 24 hours: Temp:  [97.7 F (36.5 C)-98.8 F (37.1 C)] 97.7 F (36.5 C) (07/19 0904) Pulse Rate:  [77-84] 81 (07/19 0904) Resp:  [16-20] 16 (07/19 0904) BP: (103-122)/(54-65) 122/60 (07/19 0904) SpO2:  [94 %-100 %] 94 % (07/19 0904)  Intake/Output from previous day: No intake/output data recorded.  Labs: No results for input(s): "WBC", "RBC", "HCT", "PLT" in the last 72 hours. No results for input(s): "NA", "K", "CL", "CO2", "BUN", "CREATININE", "GLUCOSE", "CALCIUM" in the last 72 hours. No results for input(s): "LABPT", "INR" in the last 72 hours.  Physical Exam: Neurologically intact ABD soft Intact pulses distally Dorsiflexion/Plantar flexion intact Incision: dressing C/D/I and no drainage Compartment soft Body mass index is 39.14 kg/m.   Assessment/Plan: Patient stable  xrays n/a Continue mobilization with physical therapy Continue care  Advance diet Up with therapy Doing well overall.  Cleared PT/OT F/U in 2 weeks  Venita Lick, MD [facility] [phone]

## 2022-05-27 ENCOUNTER — Encounter (HOSPITAL_COMMUNITY): Payer: Self-pay | Admitting: Orthopedic Surgery

## 2022-06-01 ENCOUNTER — Encounter (INDEPENDENT_AMBULATORY_CARE_PROVIDER_SITE_OTHER): Payer: Self-pay

## 2022-06-30 ENCOUNTER — Ambulatory Visit (INDEPENDENT_AMBULATORY_CARE_PROVIDER_SITE_OTHER): Payer: Medicare HMO | Admitting: Adult Health

## 2022-06-30 ENCOUNTER — Encounter (INDEPENDENT_AMBULATORY_CARE_PROVIDER_SITE_OTHER): Payer: Self-pay | Admitting: Adult Health

## 2022-06-30 VITALS — BP 126/82 | HR 79 | Temp 98.0°F | Ht 62.0 in | Wt 213.0 lb

## 2022-06-30 DIAGNOSIS — G8929 Other chronic pain: Secondary | ICD-10-CM | POA: Diagnosis not present

## 2022-06-30 DIAGNOSIS — Z6839 Body mass index (BMI) 39.0-39.9, adult: Secondary | ICD-10-CM | POA: Diagnosis not present

## 2022-06-30 DIAGNOSIS — M545 Low back pain, unspecified: Secondary | ICD-10-CM

## 2022-06-30 DIAGNOSIS — R7303 Prediabetes: Secondary | ICD-10-CM

## 2022-06-30 DIAGNOSIS — E669 Obesity, unspecified: Secondary | ICD-10-CM | POA: Diagnosis not present

## 2022-07-01 DIAGNOSIS — Z4889 Encounter for other specified surgical aftercare: Secondary | ICD-10-CM | POA: Diagnosis not present

## 2022-07-02 NOTE — Progress Notes (Unsigned)
Chief Complaint:   OBESITY Ariani is here to discuss her progress with her obesity treatment plan along with follow-up of her obesity related diagnoses. [place]ara is on the Category 3 Plan and states she is following her eating plan approximately 50% of the time. Ailsa states she is walking 15 minutes 7 times per week.  Today's visit was #: 48 Starting weight: 256 lbs Starting date: 11/18/2019 Today's weight: 213 lbs Today's date: 06/30/2022 Total lbs lost to date: 43 lbs Total lbs lost since last in-office visit: 0  Interim History: 05/09/2022 underwent *** Surgeon has only cleared her to walk 15 minutes daily.  Will follow up with Dr Shon Baton, orthopedic surgeon tomorrow.   Subjective:   1. Chronic low back pain, unspecified back pain laterality, unspecified whether sciatica present ***  2. Pre-diabetes She stopped metformin therapy one week prior to surgery and 2 weeks after resumed and denies any GI upset since then.  She is happy to have maintained her weight.    Assessment/Plan:   1. Chronic low back pain, unspecified back pain laterality, unspecified whether sciatica present Follow up with orthopedic surgeon, Dr Shon Baton tomorrow.   2. Pre-diabetes Continue daily metformin 500 mg 2 tablets at breakfast.   3. Obesity, current BMI 39.0 Makaylah is currently in the action stage of change. As such, her goal is to continue with weight loss efforts. She has agreed to the Category 3 Plan.   Exercise goals:  as is.   Behavioral modification strategies: increasing lean protein intake, decreasing simple carbohydrates, meal planning and cooking strategies, keeping healthy foods in the home, and planning for success.  Honesty has agreed to follow-up with our clinic in 4 weeks. She was informed of the importance of frequent follow-up visits to maximize her success with intensive lifestyle modifications for her multiple health conditions.   Objective:   Blood pressure 126/82,  pulse 79, temperature 98 F (36.7 C), height 5\' 2"  (1.575 m), weight 213 lb (96.6 kg), last menstrual period 10/12/2012, SpO2 99 %. Body mass index is 38.96 kg/m.  General: Cooperative, alert, well developed, in no acute distress. HEENT: Conjunctivae and lids unremarkable. Cardiovascular: Regular rhythm.  Lungs: Normal work of breathing. Neurologic: No focal deficits.   Lab Results  Component Value Date   CREATININE 0.87 05/04/2022   BUN 18 05/04/2022   NA 140 05/04/2022   K 4.4 05/04/2022   CL 103 05/04/2022   CO2 27 05/04/2022   Lab Results  Component Value Date   ALT 19 01/11/2022   AST 16 01/11/2022   ALKPHOS 115 01/11/2022   BILITOT 0.2 01/11/2022   Lab Results  Component Value Date   HGBA1C 5.7 (H) 05/05/2022   HGBA1C 5.7 (H) 01/11/2022   HGBA1C 5.6 12/01/2021   HGBA1C 5.7 (H) 07/15/2021   HGBA1C 5.7 (H) 01/13/2021   Lab Results  Component Value Date   INSULIN 10.6 05/05/2022   INSULIN 8.3 01/11/2022   INSULIN 12.5 12/01/2021   INSULIN 8.8 07/15/2021   INSULIN 15.6 01/13/2021   Lab Results  Component Value Date   TSH 1.580 12/01/2021   Lab Results  Component Value Date   CHOL 207 (H) 01/11/2022   HDL 86 01/11/2022   LDLCALC 109 (H) 01/11/2022   TRIG 69 01/11/2022   CHOLHDL 2.4 01/11/2022   Lab Results  Component Value Date   VD25OH 61.5 05/05/2022   VD25OH 71.2 01/11/2022   VD25OH 102.0 (H) 12/01/2021   Lab Results  Component Value Date  WBC 5.9 05/04/2022   HGB 12.7 05/04/2022   HCT 39.6 05/04/2022   MCV 86.7 05/04/2022   PLT 356 05/04/2022   No results found for: "IRON", "TIBC", "FERRITIN"  Obesity Behavioral Intervention:   Approximately 15 minutes were spent on the discussion below.  ASK: We discussed the diagnosis of obesity with [name] today and November agreed to give [name] permission to discuss obesity behavioral modification therapy today.  ASSESS: Royetta has the diagnosis of obesity and her BMI today is 39.0. Cynthea is in the  action stage of change.   ADVISE: Imunique was educated on the multiple health risks of obesity as well as the benefit of weight loss to improve her health. She was advised of the need for long term treatment and the importance of lifestyle modifications to improve her current health and to decrease her risk of future health problems.  AGREE: Multiple dietary modification options and treatment options were discussed and Jolita agreed to follow the recommendations documented in the above note.  ARRANGE: Nayely was educated on the importance of frequent visits to treat obesity as outlined per CMS and USPSTF guidelines and agreed to schedule her next follow up appointment today.  Attestation Statements:   Reviewed by clinician on day of visit: allergies, medications, problem list, medical history, surgical history, family history, social history, and previous encounter notes.  Time spent on visit including pre-visit chart review and post-visit care and charting was 28 minutes.   I, Malcolm Metro, RMA, am acting as Energy manager for [name], NP.  I have reviewed the above documentation for accuracy and completeness, and I agree with the above. -  ***

## 2022-07-07 DIAGNOSIS — M5451 Vertebrogenic low back pain: Secondary | ICD-10-CM | POA: Diagnosis not present

## 2022-07-08 DIAGNOSIS — M5451 Vertebrogenic low back pain: Secondary | ICD-10-CM | POA: Diagnosis not present

## 2022-07-11 DIAGNOSIS — M5451 Vertebrogenic low back pain: Secondary | ICD-10-CM | POA: Diagnosis not present

## 2022-07-14 DIAGNOSIS — M5451 Vertebrogenic low back pain: Secondary | ICD-10-CM | POA: Diagnosis not present

## 2022-07-25 DIAGNOSIS — M5451 Vertebrogenic low back pain: Secondary | ICD-10-CM | POA: Diagnosis not present

## 2022-07-27 DIAGNOSIS — M5451 Vertebrogenic low back pain: Secondary | ICD-10-CM | POA: Diagnosis not present

## 2022-07-28 ENCOUNTER — Encounter (INDEPENDENT_AMBULATORY_CARE_PROVIDER_SITE_OTHER): Payer: Self-pay | Admitting: Adult Health

## 2022-07-28 ENCOUNTER — Ambulatory Visit (INDEPENDENT_AMBULATORY_CARE_PROVIDER_SITE_OTHER): Payer: Medicare HMO | Admitting: Adult Health

## 2022-07-28 VITALS — BP 132/81 | HR 76 | Temp 97.8°F | Ht 62.0 in | Wt 216.0 lb

## 2022-07-28 DIAGNOSIS — R0602 Shortness of breath: Secondary | ICD-10-CM

## 2022-07-28 DIAGNOSIS — G8929 Other chronic pain: Secondary | ICD-10-CM | POA: Diagnosis not present

## 2022-07-28 DIAGNOSIS — E669 Obesity, unspecified: Secondary | ICD-10-CM

## 2022-07-28 DIAGNOSIS — Z6839 Body mass index (BMI) 39.0-39.9, adult: Secondary | ICD-10-CM

## 2022-07-28 DIAGNOSIS — M545 Low back pain, unspecified: Secondary | ICD-10-CM | POA: Diagnosis not present

## 2022-07-28 DIAGNOSIS — R7303 Prediabetes: Secondary | ICD-10-CM

## 2022-07-28 MED ORDER — METFORMIN HCL 500 MG PO TABS: ORAL_TABLET | ORAL | 0 refills | Status: DC

## 2022-08-01 DIAGNOSIS — M5451 Vertebrogenic low back pain: Secondary | ICD-10-CM | POA: Diagnosis not present

## 2022-08-03 DIAGNOSIS — M5451 Vertebrogenic low back pain: Secondary | ICD-10-CM | POA: Diagnosis not present

## 2022-08-08 NOTE — Progress Notes (Signed)
Chief Complaint:   OBESITY Melissa [name] is here to discuss her progress with her obesity treatment plan along with follow-up of her obesity related diagnoses. Melissa [name] is on the Category 3 Plan and states she is following her eating plan approximately 70% of the time. Melissa [name] states she is doing PT, "walking as much as I can" 45 minutes 2 times per week.  Today's visit was #: 17 Starting weight: 256 lbs Starting date: 11/18/2019 Today's weight: 216 lbs Today's date: 07/28/2022 Total lbs lost to date: 40 lbs Total lbs lost since last in-office visit: +3 lbs  Interim History:   05/09/22 Dr. Rolena Infante performed-  OBLIQUE LUMBAR INTERBODY FUSION 1 LEVEL WITH PERCUTANEOUS SCREWS (OLIF L4-5 WITH POSTERIOR SPINAL FUSION INTERBODY) ABDOMINAL EXPOSURE Melissa [name] is participating is outpatient PT 2 days per week.   She is also walking 45 minutes 2 times per week.   07/01/2022 Dr Rolena Infante discontinued her hydrocodone/tylenol.   Subjective:   1. Pre-diabetes She has a family history of T2D. She has been on metformin 500 mg 2 tablets at breakfast- tolerating it well. She was unable to tolerate Rybelsus -worsened her GERD.   2. SOB (shortness of breath) She reports dyspnea with extreme exertion.   She denies chest pain with exertion.   01/11/2022 RMR 1800  3. Chronic low back pain, unspecified back pain laterality, unspecified whether sciatica present 05/09/22 Dr. Rolena Infante performed-  OBLIQUE LUMBAR INTERBODY FUSION 1 LEVEL WITH PERCUTANEOUS SCREWS (OLIF L4-5 WITH POSTERIOR SPINAL FUSION INTERBODY) ABDOMINAL EXPOSURE Melissa [name] is participating is outpatient PT 2 days per week.   She is also walking 45 minutes 2 times per week.   07/01/2022 Dr Rolena Infante discontinued her hydrocodone/tylenol.   Assessment/Plan:   1. Pre-diabetes Check labs at next office visit.   Refill - metFORMIN (GLUCOPHAGE) 500 MG tablet; 2 tabs at breakfast  Dispense: 180 tablet; Refill: 0  2. SOB (shortness of  breath) Check IC at next office visit.   3. Chronic low back pain, unspecified back pain laterality, unspecified whether sciatica present Continue PT and walking.   4. Obesity, current BMI 39.6 "Bingo", new dog rescue.  Check IC and fasting labs at next office visit. Marland Kitchen  Pt is aware to arrive 30 minutes prior to scheduled time.   Tress is currently in the action stage of change. As such, her goal is to continue with weight loss efforts. She has agreed to the Category 3 Plan.   Exercise goals:  As is.   Behavioral modification strategies: increasing lean protein intake, decreasing simple carbohydrates, no skipping meals, meal planning and cooking strategies, and planning for success.  Danaria has agreed to follow-up with our clinic in 4 weeks. She was informed of the importance of frequent follow-up visits to maximize her success with intensive lifestyle modifications for her multiple health conditions.   Objective:   Blood pressure 132/81, pulse 76, temperature 97.8 F (36.6 C), height 5\' 2"  (1.575 m), weight 216 lb (98 kg), last menstrual period 10/12/2012, SpO2 99 %. Body mass index is 39.51 kg/m.  General: Cooperative, alert, well developed, in no acute distress. HEENT: Conjunctivae and lids unremarkable. Cardiovascular: Regular rhythm.  Lungs: Normal work of breathing. Neurologic: No focal deficits.   Lab Results  Component Value Date   CREATININE 0.87 05/04/2022   BUN 18 05/04/2022   NA 140 05/04/2022   K 4.4 05/04/2022   CL 103 05/04/2022   CO2 27 05/04/2022   Lab Results  Component Value Date  ALT 19 01/11/2022   AST 16 01/11/2022   ALKPHOS 115 01/11/2022   BILITOT 0.2 01/11/2022   Lab Results  Component Value Date   HGBA1C 5.7 (H) 05/05/2022   HGBA1C 5.7 (H) 01/11/2022   HGBA1C 5.6 12/01/2021   HGBA1C 5.7 (H) 07/15/2021   HGBA1C 5.7 (H) 01/13/2021   Lab Results  Component Value Date   INSULIN 10.6 05/05/2022   INSULIN 8.3 01/11/2022   INSULIN 12.5  12/01/2021   INSULIN 8.8 07/15/2021   INSULIN 15.6 01/13/2021   Lab Results  Component Value Date   TSH 1.580 12/01/2021   Lab Results  Component Value Date   CHOL 207 (H) 01/11/2022   HDL 86 01/11/2022   LDLCALC 109 (H) 01/11/2022   TRIG 69 01/11/2022   CHOLHDL 2.4 01/11/2022   Lab Results  Component Value Date   VD25OH 61.5 05/05/2022   VD25OH 71.2 01/11/2022   VD25OH 102.0 (H) 12/01/2021   Lab Results  Component Value Date   WBC 5.9 05/04/2022   HGB 12.7 05/04/2022   HCT 39.6 05/04/2022   MCV 86.7 05/04/2022   PLT 356 05/04/2022   No results found for: "IRON", "TIBC", "FERRITIN"  Attestation Statements:   Reviewed by clinician on day of visit: allergies, medications, problem list, medical history, surgical history, family history, social history, and previous encounter notes.  I, Davy Pique, RMA, am acting as Location manager for Mina Marble, NP.  I have reviewed the above documentation for accuracy and completeness, and I agree with the above. - Jairon Ripberger d. Alessander Sikorski, NP-C

## 2022-08-10 DIAGNOSIS — M1712 Unilateral primary osteoarthritis, left knee: Secondary | ICD-10-CM | POA: Diagnosis not present

## 2022-08-10 DIAGNOSIS — M5451 Vertebrogenic low back pain: Secondary | ICD-10-CM | POA: Diagnosis not present

## 2022-08-10 DIAGNOSIS — M25562 Pain in left knee: Secondary | ICD-10-CM | POA: Diagnosis not present

## 2022-08-10 DIAGNOSIS — M170 Bilateral primary osteoarthritis of knee: Secondary | ICD-10-CM | POA: Diagnosis not present

## 2022-08-10 DIAGNOSIS — M25561 Pain in right knee: Secondary | ICD-10-CM | POA: Diagnosis not present

## 2022-08-10 DIAGNOSIS — M1711 Unilateral primary osteoarthritis, right knee: Secondary | ICD-10-CM | POA: Diagnosis not present

## 2022-08-12 DIAGNOSIS — M5451 Vertebrogenic low back pain: Secondary | ICD-10-CM | POA: Diagnosis not present

## 2022-08-12 DIAGNOSIS — Z4889 Encounter for other specified surgical aftercare: Secondary | ICD-10-CM | POA: Diagnosis not present

## 2022-08-15 DIAGNOSIS — M5451 Vertebrogenic low back pain: Secondary | ICD-10-CM | POA: Diagnosis not present

## 2022-08-17 DIAGNOSIS — M5451 Vertebrogenic low back pain: Secondary | ICD-10-CM | POA: Diagnosis not present

## 2022-08-18 ENCOUNTER — Ambulatory Visit (INDEPENDENT_AMBULATORY_CARE_PROVIDER_SITE_OTHER): Payer: Medicare HMO | Admitting: Internal Medicine

## 2022-08-18 ENCOUNTER — Encounter (INDEPENDENT_AMBULATORY_CARE_PROVIDER_SITE_OTHER): Payer: Self-pay | Admitting: Internal Medicine

## 2022-08-18 VITALS — BP 130/75 | HR 73 | Temp 98.2°F | Ht 62.0 in | Wt 216.6 lb

## 2022-08-18 DIAGNOSIS — E669 Obesity, unspecified: Secondary | ICD-10-CM

## 2022-08-18 DIAGNOSIS — Z6841 Body Mass Index (BMI) 40.0 and over, adult: Secondary | ICD-10-CM | POA: Insufficient documentation

## 2022-08-18 DIAGNOSIS — Z6839 Body mass index (BMI) 39.0-39.9, adult: Secondary | ICD-10-CM

## 2022-08-18 DIAGNOSIS — R7303 Prediabetes: Secondary | ICD-10-CM

## 2022-08-18 DIAGNOSIS — R0609 Other forms of dyspnea: Secondary | ICD-10-CM

## 2022-08-18 DIAGNOSIS — J309 Allergic rhinitis, unspecified: Secondary | ICD-10-CM | POA: Diagnosis not present

## 2022-08-23 DIAGNOSIS — M5451 Vertebrogenic low back pain: Secondary | ICD-10-CM | POA: Diagnosis not present

## 2022-08-25 DIAGNOSIS — M5451 Vertebrogenic low back pain: Secondary | ICD-10-CM | POA: Diagnosis not present

## 2022-08-31 NOTE — Progress Notes (Signed)
Chief Complaint:   OBESITY Melissa [name] is here to discuss her progress with her obesity treatment plan along with follow-up of her obesity related diagnoses. Melissa [name] is on the Category 3 Plan and states she is following her eating plan approximately 75% of the time. Melissa [name] states she is walking for 10-15 minutes 7 times per week.  Today's visit was #: 50 Starting weight: 256 lbs Starting date: 11/18/2019 Today's weight: 216 lbs Today's date: 08/18/2022 Total lbs lost to date: 40 Total lbs lost since last in-office visit: 0  Interim History: Melissa [name] is status post back surgery and decreased physical activity as a result.  She will be starting strengthening in about a week.  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  She reports adequate satiety and satiation.  Reviewed biometrics.  IC today increased, correlates with increased muscle mass and decreased body fat, likely due to changes NEAT and exercise.  Subjective:   1. Prediabetes Esmay's last A1c was 5.7.  No abnormal cravings.  She is on metformin without side effects.  2. DOE (dyspnea on exertion) Melissa [name] notes increasing shortness of breath with exercising and seems to be worsening over time with weight gain. She notes getting out of breath sooner with activity than she used to. Melissa [name] denies shortness of breath at rest or orthopnea. Her IC is better than it was 2 years ago.   3. Allergic rhinitis, unspecified seasonality, unspecified trigger Melissa [name] was counseled on side effects of first generation antihistamine including weight gain.  Assessment/Plan:   1. Prediabetes Melissa [name] will continue with her weight loss therapy, and will continue metformin.  We will recheck A1c in 6 months and B12 level as metformin may cause B12 deficiency in some patients.  2. DOE (dyspnea on exertion) IC was repeated today, and results were reviewed with the patient.  Overall improved likely secondary to increased muscle mass and physical  activity.  3. Allergic rhinitis, unspecified seasonality, unspecified trigger Melissa [name] will consider topical therapy instead of oral antihistamines as these may contribute to weight gain.  Flonase and Azelastine are some options.  4. Obesity, current BMI 39.6 Melissa [name] is currently in the action stage of change. As such, her goal is to continue with weight loss efforts. She has agreed to the Category 3 Plan.   Patient was advised to journal 2 to 3 days/week to make sure she is staying within calories and protein range.  Exercise goals: As is.   Behavioral modification strategies: increasing lean protein intake.  Melissa [name] has agreed to follow-up with our clinic in 4 weeks. She was informed of the importance of frequent follow-up visits to maximize her success with intensive lifestyle modifications for her multiple health conditions.   Objective:   Blood pressure 130/75, pulse 73, temperature 98.2 F (36.8 C), height 5\' 2"  (1.575 m), weight 216 lb 9.6 oz (98.2 kg), last menstrual period 10/12/2012, SpO2 100 %. Body mass index is 39.62 kg/m.  General: Cooperative, alert, well developed, in no acute distress. HEENT: Conjunctivae and lids unremarkable. Cardiovascular: Regular rhythm.  Lungs: Normal work of breathing. Neurologic: No focal deficits.   Lab Results  Component Value Date   CREATININE 0.87 05/04/2022   BUN 18 05/04/2022   NA 140 05/04/2022   K 4.4 05/04/2022   CL 103 05/04/2022   CO2 27 05/04/2022   Lab Results  Component Value Date   ALT 19 01/11/2022   AST 16 01/11/2022   ALKPHOS 115 01/11/2022   BILITOT 0.2 01/11/2022  Lab Results  Component Value Date   HGBA1C 5.7 (H) 05/05/2022   HGBA1C 5.7 (H) 01/11/2022   HGBA1C 5.6 12/01/2021   HGBA1C 5.7 (H) 07/15/2021   HGBA1C 5.7 (H) 01/13/2021   Lab Results  Component Value Date   INSULIN 10.6 05/05/2022   INSULIN 8.3 01/11/2022   INSULIN 12.5 12/01/2021   INSULIN 8.8 07/15/2021   INSULIN 15.6 01/13/2021   Lab  Results  Component Value Date   TSH 1.580 12/01/2021   Lab Results  Component Value Date   CHOL 207 (H) 01/11/2022   HDL 86 01/11/2022   LDLCALC 109 (H) 01/11/2022   TRIG 69 01/11/2022   CHOLHDL 2.4 01/11/2022   Lab Results  Component Value Date   VD25OH 61.5 05/05/2022   VD25OH 71.2 01/11/2022   VD25OH 102.0 (H) 12/01/2021   Lab Results  Component Value Date   WBC 5.9 05/04/2022   HGB 12.7 05/04/2022   HCT 39.6 05/04/2022   MCV 86.7 05/04/2022   PLT 356 05/04/2022   No results found for: "IRON", "TIBC", "FERRITIN"  Attestation Statements:   Reviewed by clinician on day of visit: allergies, medications, problem list, medical history, surgical history, family history, social history, and previous encounter notes.  Time spent on visit including pre-visit chart review and post-visit care and charting was 20 minutes.   Trude Mcburney, am acting as transcriptionist for Worthy Rancher, MD.  I have reviewed the above documentation for accuracy and completeness, and I agree with the above. -Worthy Rancher, MD

## 2022-09-19 ENCOUNTER — Encounter (INDEPENDENT_AMBULATORY_CARE_PROVIDER_SITE_OTHER): Payer: Self-pay | Admitting: Internal Medicine

## 2022-09-19 ENCOUNTER — Ambulatory Visit (INDEPENDENT_AMBULATORY_CARE_PROVIDER_SITE_OTHER): Payer: Medicare HMO | Admitting: Internal Medicine

## 2022-09-19 VITALS — BP 115/69 | HR 72 | Temp 97.9°F | Ht 62.0 in | Wt 218.4 lb

## 2022-09-19 DIAGNOSIS — F32A Depression, unspecified: Secondary | ICD-10-CM | POA: Insufficient documentation

## 2022-09-19 DIAGNOSIS — Z6839 Body mass index (BMI) 39.0-39.9, adult: Secondary | ICD-10-CM

## 2022-09-19 DIAGNOSIS — F3289 Other specified depressive episodes: Secondary | ICD-10-CM

## 2022-09-19 DIAGNOSIS — R7303 Prediabetes: Secondary | ICD-10-CM

## 2022-09-19 DIAGNOSIS — R69 Illness, unspecified: Secondary | ICD-10-CM | POA: Diagnosis not present

## 2022-09-19 DIAGNOSIS — E669 Obesity, unspecified: Secondary | ICD-10-CM

## 2022-09-28 NOTE — Progress Notes (Signed)
Chief Complaint:   OBESITY Melissa [name] is here to discuss her progress with her obesity treatment plan along with follow-up of her obesity related diagnoses. Melissa [name] is on the Category 3 Plan and states she is following her eating plan approximately 75% of the time. Melissa [name] states she is at the gym for 45 minutes 2 times per week.  Today's visit was #: 51 Starting weight: 256 lbs Starting date: 11/18/2019 Today's weight: 218 lbs Today's date: 09/19/2022 Total lbs lost to date: 38 Total lbs lost since last in-office visit: 0  Interim History: Melissa [name] is present today for follow-up visit.  She still notes decreased physical activity following back surgery and also having advanced knee osteoarthritis.  She also acknowledges and deviating from the plan at times and having changes in her mood.  These lows steer her towards sweets at times.  She reports adequate satiety.  She has a new dog and a friend that supports her.  Subjective:   1. Prediabetes Melissa [name] is on metformin without side effects.  2. Other depression Melissa [name] is symptomatic.  No suicidal or homicidal ideations.  She is on fluoxetine, bupropion, and Bupar.  She has a emotional support dog and a friend who is supportive.  Assessment/Plan:   1. Prediabetes Melissa [name] will continue metformin.  She will continue with her weight loss therapy.  2. Other depression Recommended counseling with her therapist.  Continue to monitor for worsening.  May be affecting volition to eat at times.  3. Obesity, current BMI 39.9 Melissa [name] is currently in the action stage of change. As such, her goal is to continue with weight loss efforts. She has agreed to the Category 3 Plan.   Exercise goals: As is.   Behavioral modification strategies: increasing lean protein intake, emotional eating strategies, avoiding temptations, and planning for success.  Melissa [name] has agreed to follow-up with our clinic in 4 weeks. She was informed of the importance of frequent  follow-up visits to maximize her success with intensive lifestyle modifications for her multiple health conditions.   Objective:   Blood pressure 115/69, pulse 72, temperature 97.9 F (36.6 C), height 5\' 2"  (1.575 m), weight 218 lb 6.4 oz (99.1 kg), last menstrual period 10/12/2012, SpO2 99 %. Body mass index is 39.95 kg/m.  General: Cooperative, alert, well developed, in no acute distress. HEENT: Conjunctivae and lids unremarkable. Cardiovascular: Regular rhythm.  Lungs: Normal work of breathing. Neurologic: No focal deficits.   Lab Results  Component Value Date   CREATININE 0.87 05/04/2022   BUN 18 05/04/2022   NA 140 05/04/2022   K 4.4 05/04/2022   CL 103 05/04/2022   CO2 27 05/04/2022   Lab Results  Component Value Date   ALT 19 01/11/2022   AST 16 01/11/2022   ALKPHOS 115 01/11/2022   BILITOT 0.2 01/11/2022   Lab Results  Component Value Date   HGBA1C 5.7 (H) 05/05/2022   HGBA1C 5.7 (H) 01/11/2022   HGBA1C 5.6 12/01/2021   HGBA1C 5.7 (H) 07/15/2021   HGBA1C 5.7 (H) 01/13/2021   Lab Results  Component Value Date   INSULIN 10.6 05/05/2022   INSULIN 8.3 01/11/2022   INSULIN 12.5 12/01/2021   INSULIN 8.8 07/15/2021   INSULIN 15.6 01/13/2021   Lab Results  Component Value Date   TSH 1.580 12/01/2021   Lab Results  Component Value Date   CHOL 207 (H) 01/11/2022   HDL 86 01/11/2022   LDLCALC 109 (H) 01/11/2022   TRIG 69 01/11/2022   CHOLHDL 2.4  01/11/2022   Lab Results  Component Value Date   VD25OH 61.5 05/05/2022   VD25OH 71.2 01/11/2022   VD25OH 102.0 (H) 12/01/2021   Lab Results  Component Value Date   WBC 5.9 05/04/2022   HGB 12.7 05/04/2022   HCT 39.6 05/04/2022   MCV 86.7 05/04/2022   PLT 356 05/04/2022   No results found for: "IRON", "TIBC", "FERRITIN"  Attestation Statements:   Reviewed by clinician on day of visit: allergies, medications, problem list, medical history, surgical history, family history, social history, and previous  encounter notes.   Trude Mcburney, am acting as transcriptionist for Worthy Rancher, MD.  I have reviewed the above documentation for accuracy and completeness, and I agree with the above. -Worthy Rancher, MD

## 2022-10-20 ENCOUNTER — Ambulatory Visit (INDEPENDENT_AMBULATORY_CARE_PROVIDER_SITE_OTHER): Payer: Medicare HMO | Admitting: Internal Medicine

## 2022-10-21 DIAGNOSIS — R7301 Impaired fasting glucose: Secondary | ICD-10-CM | POA: Diagnosis not present

## 2022-10-21 DIAGNOSIS — R69 Illness, unspecified: Secondary | ICD-10-CM | POA: Diagnosis not present

## 2022-10-21 DIAGNOSIS — Z6841 Body Mass Index (BMI) 40.0 and over, adult: Secondary | ICD-10-CM | POA: Diagnosis not present

## 2022-10-21 DIAGNOSIS — D689 Coagulation defect, unspecified: Secondary | ICD-10-CM | POA: Diagnosis not present

## 2022-10-21 DIAGNOSIS — F339 Major depressive disorder, recurrent, unspecified: Secondary | ICD-10-CM | POA: Diagnosis not present

## 2022-10-21 DIAGNOSIS — G4733 Obstructive sleep apnea (adult) (pediatric): Secondary | ICD-10-CM | POA: Diagnosis not present

## 2022-10-21 DIAGNOSIS — Z0000 Encounter for general adult medical examination without abnormal findings: Secondary | ICD-10-CM | POA: Diagnosis not present

## 2022-10-21 DIAGNOSIS — E782 Mixed hyperlipidemia: Secondary | ICD-10-CM | POA: Diagnosis not present

## 2022-10-21 DIAGNOSIS — E039 Hypothyroidism, unspecified: Secondary | ICD-10-CM | POA: Diagnosis not present

## 2022-10-21 DIAGNOSIS — R0981 Nasal congestion: Secondary | ICD-10-CM | POA: Diagnosis not present

## 2022-11-02 ENCOUNTER — Ambulatory Visit (INDEPENDENT_AMBULATORY_CARE_PROVIDER_SITE_OTHER): Payer: Medicare HMO | Admitting: Adult Health

## 2022-11-02 ENCOUNTER — Encounter (INDEPENDENT_AMBULATORY_CARE_PROVIDER_SITE_OTHER): Payer: Self-pay | Admitting: Adult Health

## 2022-11-02 VITALS — BP 120/71 | HR 78 | Temp 98.3°F | Ht 62.0 in | Wt 221.0 lb

## 2022-11-02 DIAGNOSIS — G8929 Other chronic pain: Secondary | ICD-10-CM | POA: Diagnosis not present

## 2022-11-02 DIAGNOSIS — M25561 Pain in right knee: Secondary | ICD-10-CM

## 2022-11-02 DIAGNOSIS — R7303 Prediabetes: Secondary | ICD-10-CM

## 2022-11-02 DIAGNOSIS — M25562 Pain in left knee: Secondary | ICD-10-CM

## 2022-11-02 DIAGNOSIS — E7849 Other hyperlipidemia: Secondary | ICD-10-CM

## 2022-11-02 DIAGNOSIS — Z6841 Body Mass Index (BMI) 40.0 and over, adult: Secondary | ICD-10-CM | POA: Diagnosis not present

## 2022-11-02 DIAGNOSIS — E669 Obesity, unspecified: Secondary | ICD-10-CM

## 2022-11-08 DIAGNOSIS — Z4889 Encounter for other specified surgical aftercare: Secondary | ICD-10-CM | POA: Diagnosis not present

## 2022-11-09 DIAGNOSIS — G8929 Other chronic pain: Secondary | ICD-10-CM | POA: Insufficient documentation

## 2022-11-09 NOTE — Progress Notes (Signed)
Chief Complaint:   OBESITY Melissa [name] is here to discuss her progress with her obesity treatment plan along with follow-up of her obesity related diagnoses. Melissa [name] is on the Category 3 Plan and states she is following her eating plan approximately 75% of the time. Melissa [name] states she is not exercising.  Today's visit was #: 2 Starting weight: 256 LBS Starting date: 11/18/2019 Today's weight: 221 LBS Today's date: 11/02/2022 Total lbs lost to date: 35 LBS Total lbs lost since last in-office visit: +3 LBS  Interim History:  Melissa [name] travelled to [place] for Christmas holidays. She feel ill with URI after holiday season.  She established with new PCP/K. Shepperson PA-C on 10/21/2022. Reviewed recent labs from PCP with pt at length- results in [facility].  Subjective:   1. Other hyperlipidemia 10/21/2022, lipid panel with PCP, total cholesterol 217, HDL 87, triglycerides 86, LDL 115.   Patient is taking pravastatin 80 mg daily. She denies myalgia's. She denies tobacco/vape use.  2. Prediabetes Patient stopped metformin 500 mg 2 tablets of breakfast in late December due to increased GERD symptoms.   10/21/2022 A1c was 5.8.  3. Bilateral chronic knee pain Patient's insurance has approved for "gel injections". Right knee is more painful than Left knee. She will receive one injection per week, for 3 week series.  Assessment/Plan:   1. Other hyperlipidemia Continue daily statin and increase regular exercise.  2. Prediabetes Restart metformin 500 mg daily.   Stay on one tablet daily until next OV. No need for refill today.  3. Bilateral chronic knee pain Follow-up with Dr. Alvan [facility] at [facility].  4. Obesity, current BMI 40.4 Shirly is currently in the action stage of change. As such, her goal is to continue with weight loss efforts. She has agreed to the Category 3 Plan.   Exercise goals:  As is.  Behavioral modification strategies: increasing lean protein  intake, decreasing simple carbohydrates, meal planning and cooking strategies, keeping healthy foods in the home, and planning for success.  Melissa [name] has agreed to follow-up with our clinic in 4 weeks. She was informed of the importance of frequent follow-up visits to maximize her success with intensive lifestyle modifications for her multiple health conditions.   Objective:   Blood pressure 120/71, pulse 78, temperature 98.3 F (36.8 C), height 5\' 2"  (1.575 m), weight 221 lb (100.2 kg), last menstrual period 10/12/2012, SpO2 98 %. Body mass index is 40.42 kg/m.  General: Cooperative, alert, well developed, in no acute distress. HEENT: Conjunctivae and lids unremarkable. Cardiovascular: Regular rhythm.  Lungs: Normal work of breathing. Neurologic: No focal deficits.   Lab Results  Component Value Date   CREATININE 0.87 05/04/2022   BUN 18 05/04/2022   NA 140 05/04/2022   K 4.4 05/04/2022   CL 103 05/04/2022   CO2 27 05/04/2022   Lab Results  Component Value Date   ALT 19 01/11/2022   AST 16 01/11/2022   ALKPHOS 115 01/11/2022   BILITOT 0.2 01/11/2022   Lab Results  Component Value Date   HGBA1C 5.7 (H) 05/05/2022   HGBA1C 5.7 (H) 01/11/2022   HGBA1C 5.6 12/01/2021   HGBA1C 5.7 (H) 07/15/2021   HGBA1C 5.7 (H) 01/13/2021   Lab Results  Component Value Date   INSULIN 10.6 05/05/2022   INSULIN 8.3 01/11/2022   INSULIN 12.5 12/01/2021   INSULIN 8.8 07/15/2021   INSULIN 15.6 01/13/2021   Lab Results  Component Value Date   TSH 1.580 12/01/2021   Lab Results  Component Value Date   CHOL 207 (H) 01/11/2022   HDL 86 01/11/2022   LDLCALC 109 (H) 01/11/2022   TRIG 69 01/11/2022   CHOLHDL 2.4 01/11/2022   Lab Results  Component Value Date   VD25OH 61.5 05/05/2022   VD25OH 71.2 01/11/2022   VD25OH 102.0 (H) 12/01/2021   Lab Results  Component Value Date   WBC 5.9 05/04/2022   HGB 12.7 05/04/2022   HCT 39.6 05/04/2022   MCV 86.7 05/04/2022   PLT 356 05/04/2022    No results found for: "IRON", "TIBC", "FERRITIN"  Attestation Statements:   Reviewed by clinician on day of visit: allergies, medications, problem list, medical history, surgical history, family history, social history, and previous encounter notes.  I, Davy Pique, RMA, am acting as Location manager for Mina Marble, NP.  I have reviewed the above documentation for accuracy and completeness, and I agree with the above. -  Talitha Dicarlo d. Tammatha Cobb, NP-C

## 2022-11-14 DIAGNOSIS — R509 Fever, unspecified: Secondary | ICD-10-CM | POA: Diagnosis not present

## 2022-11-14 DIAGNOSIS — R0981 Nasal congestion: Secondary | ICD-10-CM | POA: Diagnosis not present

## 2022-11-14 DIAGNOSIS — F339 Major depressive disorder, recurrent, unspecified: Secondary | ICD-10-CM | POA: Diagnosis not present

## 2022-11-14 DIAGNOSIS — R69 Illness, unspecified: Secondary | ICD-10-CM | POA: Diagnosis not present

## 2022-11-14 DIAGNOSIS — H66001 Acute suppurative otitis media without spontaneous rupture of ear drum, right ear: Secondary | ICD-10-CM | POA: Diagnosis not present

## 2022-11-29 DIAGNOSIS — J069 Acute upper respiratory infection, unspecified: Secondary | ICD-10-CM | POA: Diagnosis not present

## 2022-11-29 DIAGNOSIS — B9689 Other specified bacterial agents as the cause of diseases classified elsewhere: Secondary | ICD-10-CM | POA: Diagnosis not present

## 2022-11-29 DIAGNOSIS — R059 Cough, unspecified: Secondary | ICD-10-CM | POA: Diagnosis not present

## 2022-11-29 DIAGNOSIS — R0981 Nasal congestion: Secondary | ICD-10-CM | POA: Diagnosis not present

## 2022-11-30 ENCOUNTER — Ambulatory Visit (INDEPENDENT_AMBULATORY_CARE_PROVIDER_SITE_OTHER): Payer: Medicare HMO | Admitting: Adult Health

## 2022-12-06 ENCOUNTER — Encounter (INDEPENDENT_AMBULATORY_CARE_PROVIDER_SITE_OTHER): Payer: Self-pay | Admitting: Adult Health

## 2022-12-06 ENCOUNTER — Ambulatory Visit (INDEPENDENT_AMBULATORY_CARE_PROVIDER_SITE_OTHER): Payer: Medicare HMO | Admitting: Adult Health

## 2022-12-06 VITALS — BP 124/77 | HR 81 | Temp 98.2°F | Ht 62.0 in | Wt 222.0 lb

## 2022-12-06 DIAGNOSIS — J069 Acute upper respiratory infection, unspecified: Secondary | ICD-10-CM | POA: Diagnosis not present

## 2022-12-06 DIAGNOSIS — R7303 Prediabetes: Secondary | ICD-10-CM

## 2022-12-06 DIAGNOSIS — E669 Obesity, unspecified: Secondary | ICD-10-CM | POA: Diagnosis not present

## 2022-12-06 DIAGNOSIS — B9689 Other specified bacterial agents as the cause of diseases classified elsewhere: Secondary | ICD-10-CM

## 2022-12-06 DIAGNOSIS — Z6841 Body Mass Index (BMI) 40.0 and over, adult: Secondary | ICD-10-CM

## 2022-12-06 MED ORDER — METFORMIN HCL 500 MG PO TABS
ORAL_TABLET | ORAL | 0 refills | Status: DC
Start: 2022-12-06 — End: 2023-02-02

## 2022-12-06 NOTE — Progress Notes (Signed)
Chief Complaint:   OBESITY Melissa [name] is here to discuss her progress with her obesity treatment plan along with follow-up of her obesity related diagnoses. Melissa [name] is on the Category 3 Plan and states she is following her eating plan approximately 75% of the time.  Melissa [name] states she is not been exercising due to lingering URI sx's.  Today's visit was #: 46 Starting weight: 256 lbs Starting date: 11/18/2019 Today's weight: 222 lbs Today's date: 12/06/2022 Total lbs lost to date: 34 lbs Total lbs lost since last in-office visit: + 1 lbs  Interim History:  Melissa [name] continues to experience URI sx's-  Currently on:  Doxycycline 169m twice a day Flonase 2 sprays each nostril twice a day  Symbicort inhaler two puffs twice a day  Goal for Lent Limit Sweet treat to ONE on Sunday Resume Gym exercise on Monday 12/12/22 Resume regular/routine sleep schedule   Subjective:   1. Pre-diabetes Restarted daily Metformin 5089mon 11/02/2022 Denies GI upset- prefers to remain on this dose. Lab Results  Component Value Date   HGBA1C 5.7 (H) 05/05/2022   HGBA1C 5.7 (H) 01/11/2022   HGBA1C 5.6 12/01/2021    2. Bacterial URI 11/29/2022 10:59 AM EST  IMPRESSION: No acute pulmonary abnormality.   Electronically Signed by: MiSol PasserMD on 11/29/2022 10:59 AM    Imaging Results - XR Chest Pa And Lateral (11/29/2022 9:53 AM EST) Narrative  11/29/2022 10:59 AM EST  This result has an attachment that is not available.  TECHNIQUE: XR CHEST PA AND LATERAL  INDICATION: Cough, unspecified type  COMPARISON: None  FINDINGS: Normal cardiac and mediastinal contours. No pleural effusion or pneumothorax. The lungs are clear. Mild degenerative changes within the spine. Partially imaged right shoulder arthroplasty.       Assessment/Plan:   1. Pre-diabetes  - Continue metFORMIN (GLUCOPHAGE) 500 MG tablet; 1 tab at breakfast  no need for RF  2. Bacterial URI Complete ABX as  directed  3. Obesity, current BMI 40.7  AsAmeliagraces currently in the action stage of change. As such, her goal is to continue with weight loss efforts. She has agreed to the Category 3 Plan.   Exercise goals: No exercise has been prescribed at this time.  Behavioral modification strategies: increasing lean protein intake, decreasing simple carbohydrates, increasing vegetables, increasing water intake, and planning for success.  AsZobiaas agreed to follow-up with our clinic in 4 weeks. She was informed of the importance of frequent follow-up visits to maximize her success with intensive lifestyle modifications for her multiple health conditions.    Objective:   Blood pressure 124/77, pulse 81, temperature 98.2 F (36.8 C), height 5' 2"$  (1.575 m), weight 222 lb (100.7 kg), last menstrual period 10/12/2012, SpO2 99 %. Body mass index is 40.6 kg/m.  General: Cooperative, alert, well developed, in no acute distress. HEENT: Conjunctivae and lids unremarkable. Cardiovascular: Regular rhythm.  Lungs: Normal work of breathing. Neurologic: No focal deficits.   Lab Results  Component Value Date   CREATININE 0.87 05/04/2022   BUN 18 05/04/2022   NA 140 05/04/2022   K 4.4 05/04/2022   CL 103 05/04/2022   CO2 27 05/04/2022   Lab Results  Component Value Date   ALT 19 01/11/2022   AST 16 01/11/2022   ALKPHOS 115 01/11/2022   BILITOT 0.2 01/11/2022   Lab Results  Component Value Date   HGBA1C 5.7 (H) 05/05/2022   HGBA1C 5.7 (H) 01/11/2022   HGBA1C 5.6 12/01/2021  HGBA1C 5.7 (H) 07/15/2021   HGBA1C 5.7 (H) 01/13/2021   Lab Results  Component Value Date   INSULIN 10.6 05/05/2022   INSULIN 8.3 01/11/2022   INSULIN 12.5 12/01/2021   INSULIN 8.8 07/15/2021   INSULIN 15.6 01/13/2021   Lab Results  Component Value Date   TSH 1.580 12/01/2021   Lab Results  Component Value Date   CHOL 207 (H) 01/11/2022   HDL 86 01/11/2022   LDLCALC 109 (H) 01/11/2022   TRIG 69 01/11/2022    CHOLHDL 2.4 01/11/2022   Lab Results  Component Value Date   VD25OH 61.5 05/05/2022   VD25OH 71.2 01/11/2022   VD25OH 102.0 (H) 12/01/2021   Lab Results  Component Value Date   WBC 5.9 05/04/2022   HGB 12.7 05/04/2022   HCT 39.6 05/04/2022   MCV 86.7 05/04/2022   PLT 356 05/04/2022   No results found for: "IRON", "TIBC", "FERRITIN"  Attestation Statements:   Reviewed by clinician on day of visit: allergies, medications, problem list, medical history, surgical history, family history, social history, and previous encounter notes.  I have reviewed the above documentation for accuracy and completeness, and I agree with the above. -  Shanley Furlough d. Jyssica Rief, NP-C

## 2022-12-15 ENCOUNTER — Encounter (HOSPITAL_BASED_OUTPATIENT_CLINIC_OR_DEPARTMENT_OTHER): Payer: Self-pay

## 2022-12-15 ENCOUNTER — Other Ambulatory Visit: Payer: Self-pay

## 2022-12-15 ENCOUNTER — Emergency Department (HOSPITAL_BASED_OUTPATIENT_CLINIC_OR_DEPARTMENT_OTHER)
Admission: EM | Admit: 2022-12-15 | Discharge: 2022-12-15 | Disposition: A | Discharge status: Home / Self Care | Payer: Medicare HMO | Attending: Emergency Medicine | Admitting: Emergency Medicine

## 2022-12-15 DIAGNOSIS — Z23 Encounter for immunization: Secondary | ICD-10-CM | POA: Insufficient documentation

## 2022-12-15 DIAGNOSIS — S61211A Laceration without foreign body of left index finger without damage to nail, initial encounter: Secondary | ICD-10-CM | POA: Diagnosis not present

## 2022-12-15 DIAGNOSIS — S61412A Laceration without foreign body of left hand, initial encounter: Secondary | ICD-10-CM | POA: Diagnosis present

## 2022-12-15 DIAGNOSIS — S61251A Open bite of left index finger without damage to nail, initial encounter: Secondary | ICD-10-CM | POA: Diagnosis not present

## 2022-12-15 DIAGNOSIS — W540XXA Bitten by dog, initial encounter: Secondary | ICD-10-CM | POA: Insufficient documentation

## 2022-12-15 MED ORDER — TETANUS-DIPHTH-ACELL PERTUSSIS 5-2.5-18.5 LF-MCG/0.5 IM SUSY
0.5000 mL | PREFILLED_SYRINGE | Freq: Once | INTRAMUSCULAR | Status: AC
  Administered 2022-12-15: 0.5 mL via INTRAMUSCULAR
  Filled 2022-12-15: qty 0.5

## 2022-12-15 MED ORDER — AMOXICILLIN-POT CLAVULANATE 875-125 MG PO TABS
1.0000 | ORAL_TABLET | Freq: Once | ORAL | Status: AC
  Administered 2022-12-15: 1 via ORAL
  Filled 2022-12-15: qty 1

## 2022-12-15 MED ORDER — LIDOCAINE HCL 2 % IJ SOLN
10.0000 mL | Freq: Once | INTRAMUSCULAR | Status: AC
  Administered 2022-12-15: 200 mg
  Filled 2022-12-15: qty 20

## 2022-12-15 MED ORDER — AMOXICILLIN-POT CLAVULANATE 875-125 MG PO TABS: 1.0000 | ORAL_TABLET | Freq: Two times a day (BID) | ORAL | 0 refills | Status: DC

## 2022-12-15 NOTE — Discharge Instructions (Addendum)
You were seen in the ER after a dog bite.  We have closed the wounds, updated your tetanus, and gave you the first dose of antibiotics. I've sent the rest of the course to your pharmacy.  I want you to get the sutures removed in 7-10 days, and this can be done by your primary doctor.   The dermabond should come off in about 7 days as well, if not, you can use some vaseline to help it peel off.  Continue to monitor how you're doing and return to the ER for new or worsening symptoms.

## 2022-12-15 NOTE — ED Provider Notes (Signed)
[facility] Provider Note   CSN: [id number] Arrival date & time: 12/15/22  1923     History  Chief Complaint  Patient presents with   Laceration    Melissa [name] is a 57 y.o. female who presents to the ER for dog bite to the left hand. Patient was separating her two dogs who were fighting and sustained lacerations to her left 2nd and 3rd fingers. Reports dogs are up to date on vaccinations. Patient unsure of her last tetanus. Patient is right handed.    Laceration      Home Medications Prior to Admission medications   Medication Sig Start Date End Date Taking? Authorizing Provider  amoxicillin-clavulanate (AUGMENTIN) 875-125 MG tablet Take 1 tablet by mouth every 12 (twelve) hours. 12/15/22  Yes Millicent Blazejewski T, PA-C  Ascorbic Acid (VITAMIN C) 1000 MG tablet Take 3,000 mg by mouth at bedtime.    [provider]  B Complex-C (B-COMPLEX WITH VITAMIN C) tablet Take 1 tablet by mouth at bedtime.     [provider]  buPROPion (WELLBUTRIN XL) 300 MG 24 hr tablet Take 300 mg by mouth daily. 05/14/20   [provider]  busPIRone (BUSPAR) 5 MG tablet Take 5 mg by mouth 2 (two) times daily as needed for anxiety. 06/15/20   [provider]  chlorpheniramine (CHLOR-TRIMETON) 4 MG tablet Take 4 mg by mouth daily.    [provider]  cholecalciferol (VITAMIN D3) 25 MCG (1000 UNIT) tablet Take 1,000 Units by mouth daily.    [provider]  diphenhydrAMINE (BENADRYL) 25 mg capsule Take 1 capsule (25 mg total) by mouth every 6 (six) hours as needed for itching, allergies or sleep. Patient taking differently: Take 50-75 mg by mouth daily as needed for itching, allergies or sleep. 11/06/12   Danae Orleans, PA-C  EPINEPHrine 0.3 mg/0.3 mL IJ SOAJ injection Inject 0.3 mg into the muscle once as needed (allergic reaction).  04/23/13   [provider]  FLUoxetine (PROZAC) 40 MG capsule Take 40  mg by mouth daily.    [provider]  levothyroxine (SYNTHROID) 125 MCG tablet Take 125 mcg by mouth daily before breakfast. 10/20/21   [provider]  LINZESS 145 MCG CAPS capsule Take 145 mcg by mouth daily. 09/24/21   [provider]  meclizine (ANTIVERT) 25 MG tablet Take 25 mg by mouth 3 (three) times daily as needed for dizziness.    [provider]  metFORMIN (GLUCOPHAGE) 500 MG tablet 1 tab at breakfast 12/06/22   Danford, Katy D, NP  montelukast (SINGULAIR) 10 MG tablet Take 10 mg by mouth at bedtime.    [provider]  Naphazoline-Pheniramine ([employer] EYE ALLERGY RELIEF OP) Place 1 drop into both eyes daily as needed (allergies).    [provider]  omeprazole (PRILOSEC) 40 MG capsule Take 40 mg by mouth at bedtime.     [provider]  ondansetron (ZOFRAN) 4 MG tablet Take 1 tablet (4 mg total) by mouth every 8 (eight) hours as needed for nausea or vomiting. 05/09/22   Melina Schools, MD  pravastatin (PRAVACHOL) 80 MG tablet Take by mouth. 10/20/21   [provider]      Allergies    Bee venom and Lactose intolerance (gi)    Review of Systems   Review of Systems  Skin:  Positive for wound.  All other systems reviewed and are negative.   Physical Exam Updated Vital Signs BP 113/63  Pulse 88   Temp 98.7 F (37.1 C)   Resp 18   Ht 5' 2"$  (1.575 m)   Wt 99.8 kg   LMP 10/12/2012   SpO2 95%   BMI 40.24 kg/m  Physical Exam Vitals and nursing note reviewed.  Constitutional:      Appearance: Normal appearance.  HENT:     Head: Normocephalic and atraumatic.  Eyes:     Conjunctiva/sclera: Conjunctivae normal.  Pulmonary:     Effort: Pulmonary effort is normal. No respiratory distress.  Skin:    General: Skin is warm and dry.     Comments: Two lacerations noted to the palmar left 2nd finger pad, approximately 1.5 cm and 1 cm. Superficial abrasions noted to the dorsal left 2nd and 3rd digits, one of which  extending to the cuticle of the 2nd digit. No nail bed involvement.   Neurological:     Mental Status: She is alert.  Psychiatric:        Mood and Affect: Mood normal.        Behavior: Behavior normal.     ED Results / Procedures / Treatments   Labs (all labs ordered are listed, but only abnormal results are displayed) Labs Reviewed - No data to display  EKG None  Radiology No results found.  Procedures .Marland KitchenLaceration Repair  Date/Time: 12/15/2022 9:49 PM  Performed by: Kateri Plummer, PA-C Authorized by: Kateri Plummer, PA-C   Consent:    Consent obtained:  Verbal   Consent given by:  Patient   Risks, benefits, and alternatives were discussed: yes     Risks discussed:  Pain, infection and poor cosmetic result Universal protocol:    Procedure explained and questions answered to patient or proxy's satisfaction: yes     Patient identity confirmed:  Provided demographic data Anesthesia:    Anesthesia method:  Nerve block   Block location:  Left 2nd digit hand   Block needle gauge:  25 G   Block anesthetic:  Lidocaine 2% w/o epi   Block technique:  Modified transthecal   Block injection procedure:  Anatomic landmarks palpated   Block outcome:  Anesthesia achieved Laceration details:    Location:  Finger   Finger location:  L index finger   Length (cm):  1.5 (second laceration - 1 cm)   Depth (mm):  1 Exploration:    Limited defect created (wound extended): no     Hemostasis achieved with:  Tourniquet   Wound exploration: entire depth of wound visualized   Treatment:    Area cleansed with:  Shur-Clens and soap and water   Amount of cleaning:  Standard Skin repair:    Repair method:  Sutures and tissue adhesive   Suture size:  5-0   Suture material:  Prolene   Suture technique:  Simple interrupted   Number of sutures:  6 Approximation:    Approximation:  Loose Repair type:    Repair type:  Simple Post-procedure details:    Dressing:  Non-adherent  dressing   Procedure completion:  Tolerated well, no immediate complications     Medications Ordered in ED Medications  lidocaine (XYLOCAINE) 2 % (with pres) injection 200 mg (200 mg Infiltration Given 12/15/22 2015)  Tdap (BOOSTRIX) injection 0.5 mL (0.5 mLs Intramuscular Given 12/15/22 2011)  amoxicillin-clavulanate (AUGMENTIN) 875-125 MG per tablet 1 tablet (1 tablet Oral Given 12/15/22 2145)    ED Course/ Medical Decision Making/ A&P  Medical Decision Making Risk Prescription drug management.   Patient is a 57 y.o. female who presents to the emergency department with concern for left hand lacerations after separating her fighting dogs. Wound occurred just prior to ER arrival.   Physical exam: Two deep lacerations noted to the pad of the left 2nd digit, superficial abrasions/lacerations to dorsum of left 2nd and 3rd digits. Extending to cuticle of 2nd digit. No nail bed involvement.   Procedure: Wound explored and base of wound visualized in a bloodless field without evidence of foreign body. Laceration was anesthestized with lidocaine and closed with loose sutures and skin glue. Patient tolerated procedure well with no immediate complications. Tdap was updated.   Disposition: Patient discharged with antibiotics to cover for dog bite.  Discussed suture home care with patient and answered questions. Patient to follow-up for wound check and suture removal in 7 days, patient plans to do so with PCP. They are to return to the ED sooner for signs of infection. Pt is hemodynamically stable with no complaints prior to discharge.  Final Clinical Impression(s) / ED Diagnoses Final diagnoses:  Dog bite, initial encounter    Rx / DC Orders ED Discharge Orders          Ordered    amoxicillin-clavulanate (AUGMENTIN) 875-125 MG tablet  Every 12 hours        12/15/22 2143           Portions of this report may have been transcribed using voice recognition  software. Every effort was made to ensure accuracy; however, inadvertent computerized transcription errors may be present.    Estill Cotta 12/15/22 2153    Davonna Belling, MD 12/15/22 [phone]

## 2022-12-15 NOTE — ED Notes (Signed)
Wound soaked in water. Wound cleanser applied. Wet gauze applied. Tolerates well.  Lidocaine at bedside with suture cart.  Tetanus shot administered- left deltoid- date card given.

## 2022-12-15 NOTE — ED Triage Notes (Signed)
To ED with c/o L 2nd and 3rd digit laceration. Pt was breaking up her dogs from fighting. Reports they are UTD on vaccines. Unsure of last tetanus. Bleeding controlled.

## 2022-12-15 NOTE — ED Notes (Signed)
Pt verbalized understanding of d/c instructions, meds, and followup care. Denies questions. VSS, no distress noted. Steady gait to exit with all belongings. 

## 2022-12-23 DIAGNOSIS — S61258D Open bite of other finger without damage to nail, subsequent encounter: Secondary | ICD-10-CM | POA: Diagnosis not present

## 2022-12-23 DIAGNOSIS — J069 Acute upper respiratory infection, unspecified: Secondary | ICD-10-CM | POA: Diagnosis not present

## 2022-12-23 DIAGNOSIS — Z96611 Presence of right artificial shoulder joint: Secondary | ICD-10-CM | POA: Diagnosis not present

## 2022-12-23 DIAGNOSIS — Z6841 Body Mass Index (BMI) 40.0 and over, adult: Secondary | ICD-10-CM | POA: Diagnosis not present

## 2022-12-23 DIAGNOSIS — B9689 Other specified bacterial agents as the cause of diseases classified elsewhere: Secondary | ICD-10-CM | POA: Diagnosis not present

## 2022-12-23 DIAGNOSIS — W540XXD Bitten by dog, subsequent encounter: Secondary | ICD-10-CM | POA: Diagnosis not present

## 2022-12-23 DIAGNOSIS — G8929 Other chronic pain: Secondary | ICD-10-CM | POA: Diagnosis not present

## 2022-12-23 DIAGNOSIS — M25511 Pain in right shoulder: Secondary | ICD-10-CM | POA: Diagnosis not present

## 2022-12-29 DIAGNOSIS — R413 Other amnesia: Secondary | ICD-10-CM | POA: Diagnosis not present

## 2022-12-29 DIAGNOSIS — W540XXD Bitten by dog, subsequent encounter: Secondary | ICD-10-CM | POA: Diagnosis not present

## 2022-12-29 DIAGNOSIS — I10 Essential (primary) hypertension: Secondary | ICD-10-CM | POA: Diagnosis not present

## 2022-12-29 DIAGNOSIS — S61258D Open bite of other finger without damage to nail, subsequent encounter: Secondary | ICD-10-CM | POA: Diagnosis not present

## 2022-12-29 DIAGNOSIS — Z823 Family history of stroke: Secondary | ICD-10-CM | POA: Diagnosis not present

## 2022-12-30 DIAGNOSIS — M25511 Pain in right shoulder: Secondary | ICD-10-CM | POA: Diagnosis not present

## 2023-01-02 ENCOUNTER — Other Ambulatory Visit: Payer: Self-pay | Admitting: Orthopaedic Surgery

## 2023-01-02 DIAGNOSIS — M25511 Pain in right shoulder: Secondary | ICD-10-CM

## 2023-01-02 DIAGNOSIS — M25519 Pain in unspecified shoulder: Secondary | ICD-10-CM

## 2023-01-04 ENCOUNTER — Ambulatory Visit (INDEPENDENT_AMBULATORY_CARE_PROVIDER_SITE_OTHER): Payer: Medicare HMO | Admitting: Adult Health

## 2023-01-04 ENCOUNTER — Encounter (INDEPENDENT_AMBULATORY_CARE_PROVIDER_SITE_OTHER): Payer: Self-pay | Admitting: Adult Health

## 2023-01-04 VITALS — BP 114/75 | HR 74 | Temp 97.7°F | Ht 62.0 in | Wt 225.0 lb

## 2023-01-04 DIAGNOSIS — E875 Hyperkalemia: Secondary | ICD-10-CM | POA: Diagnosis not present

## 2023-01-04 DIAGNOSIS — Z6841 Body Mass Index (BMI) 40.0 and over, adult: Secondary | ICD-10-CM | POA: Diagnosis not present

## 2023-01-04 DIAGNOSIS — I10 Essential (primary) hypertension: Secondary | ICD-10-CM | POA: Diagnosis not present

## 2023-01-04 DIAGNOSIS — R7303 Prediabetes: Secondary | ICD-10-CM | POA: Diagnosis not present

## 2023-01-04 DIAGNOSIS — E669 Obesity, unspecified: Secondary | ICD-10-CM

## 2023-01-04 DIAGNOSIS — F439 Reaction to severe stress, unspecified: Secondary | ICD-10-CM

## 2023-01-04 DIAGNOSIS — R69 Illness, unspecified: Secondary | ICD-10-CM | POA: Diagnosis not present

## 2023-01-04 DIAGNOSIS — E559 Vitamin D deficiency, unspecified: Secondary | ICD-10-CM | POA: Diagnosis not present

## 2023-01-04 NOTE — Progress Notes (Signed)
WEIGHT SUMMARY AND BIOMETRICS  Vitals Temp: 97.7 F (36.5 C) BP: 114/75 Pulse Rate: 74 SpO2: 100 %   Anthropometric Measurements Height: '5\' 2"'$  (1.575 m) Weight: 225 lb (102.1 kg) BMI (Calculated): 41.14 Weight at Last Visit: 222lb Weight Lost Since Last Visit: +3lb Starting Weight: 256lb Total Weight Loss (lbs): 31 lb (14.1 kg)   Body Composition  Body Fat %: 49.3 % Fat Mass (lbs): 111.2 lbs Muscle Mass (lbs): 108.6 lbs Total Body Water (lbs): 79 lbs Visceral Fat Rating : 16   Other Clinical Data Fasting: yes Labs: no Today's Visit #: 9 Starting Date: 11/18/19    Chief Complaint:   OBESITY Melissa [name] is here to discuss her progress with her obesity treatment plan. She is on the the Category 3 Plan and states she is following her eating plan approximately 50 % of the time.  She states she is nit currently exercising  Interim History:  Events since last OV: Her dogs became aggressive and fought- she suffered bite injury. ED OV 12/15/2022- Melissa [name] is a 57 y.o. female who presents to the ER for dog bite to the left hand. Patient was separating her two dogs who were fighting and sustained lacerations to her left 2nd and 3rd fingers. Reports dogs are up to date on vaccinations. Patient unsure of her last tetanus. Patient is right handed.  Procedure: Wound explored and base of wound visualized in a bloodless field without evidence of foreign body. Laceration was anesthestized with lidocaine and closed with loose sutures and skin glue. Patient tolerated procedure well with no immediate complications. Tdap was updated.    amoxicillin-clavulanate (AUGMENTIN) 875-125 MG tablet  Every 12 hours       12/15/22 2143 12/23/2022- PCP 1. Dog bite of index finger, subsequent encounter (Primary) - mupirocin (BACTROBAN) 2 % ointment; Apply to affected area 3 times daily, Normal - amoxicillin-clavulanate (AUGMENTIN) 875-125 mg per tablet; Take one tablet by mouth 2 (two)  times daily for 7 days., Starting Fri 12/23/2022, Until Fri 12/30/2022, Normal  12/26/2022- Melissa [name] was put to sleep 12/27/2022- Her mother suffered TIA- currently in rehab in stable with deficits 12/29/2022- PCP started her on Losartan 25 mg QD and ASA 81 MG QD  Subjective:   1. Hypertension, unspecified type PCP started her on Losartan '25mg'$  QD on 12/29/2022 BP today excellent- 114/75  2. Hyperkalemia She reports intermittent, brief palpitations. Both her mother and sister have A Fib  3. Pre-diabetes  Latest Reference Range & Units 05/05/22 09:20  Hemoglobin A1C 4.8 - 5.6 % 5.7 (H)  Est. average glucose Bld gHb Est-mCnc mg/dL 117  INSULIN 2.6 - 24.9 uIU/mL 10.6  (H): Data is abnormally high She is currently Metformin '500mg'$  QAM- tolerating well. Mother is prediabetic Father adult onset diabetes  34. Vitamin D deficiency She is on OTC Vit D3   5. Stress at home    Assessment/Plan:   1. Hypertension, unspecified type F/u PCP 04/21/23  2. Hyperkalemia Check Labs - Comprehensive metabolic panel  3. Pre-diabetes Check Labs - Hemoglobin A1c - Insulin, random - Vitamin B12  4. Vitamin D deficiency Check Labs - VITAMIN D 25 Hydroxy (Vit-D Deficiency, Fractures)  5. Stress at home Resume exercise when able  6. Obesity, current BMI 41.14  Melissa [name] is currently in the action stage of change. As such, her goal is to continue with weight loss efforts. She has agreed to the Category 3 Plan.   Exercise goals: All adults should avoid inactivity. Some physical  activity is better than none, and adults who participate in any amount of physical activity gain some health benefits. Adults should also include muscle-strengthening activities that involve all major muscle groups on 2 or more days a week.  Behavioral modification strategies: increasing lean protein intake, decreasing simple carbohydrates, increasing vegetables, increasing water intake, no skipping meals, meal planning and cooking  strategies, keeping healthy foods in the home, and planning for success.  Melissa [name] has agreed to follow-up with our clinic in 4 weeks. She was informed of the importance of frequent follow-up visits to maximize her success with intensive lifestyle modifications for her multiple health conditions.   Objective:   Blood pressure 114/75, pulse 74, temperature 97.7 F (36.5 C), height '5\' 2"'$  (1.575 m), weight 225 lb (102.1 kg), last menstrual period 10/12/2012, SpO2 100 %. Body mass index is 41.15 kg/m.  General: Cooperative, alert, well developed, in no acute distress. HEENT: Conjunctivae and lids unremarkable. Cardiovascular: Regular rhythm.  Lungs: Normal work of breathing. Neurologic: No focal deficits.   Lab Results  Component Value Date   CREATININE 0.87 05/04/2022   BUN 18 05/04/2022   NA 140 05/04/2022   K 4.4 05/04/2022   CL 103 05/04/2022   CO2 27 05/04/2022   Lab Results  Component Value Date   ALT 19 01/11/2022   AST 16 01/11/2022   ALKPHOS 115 01/11/2022   BILITOT 0.2 01/11/2022   Lab Results  Component Value Date   HGBA1C 5.7 (H) 05/05/2022   HGBA1C 5.7 (H) 01/11/2022   HGBA1C 5.6 12/01/2021   HGBA1C 5.7 (H) 07/15/2021   HGBA1C 5.7 (H) 01/13/2021   Lab Results  Component Value Date   INSULIN 10.6 05/05/2022   INSULIN 8.3 01/11/2022   INSULIN 12.5 12/01/2021   INSULIN 8.8 07/15/2021   INSULIN 15.6 01/13/2021   Lab Results  Component Value Date   TSH 1.580 12/01/2021   Lab Results  Component Value Date   CHOL 207 (H) 01/11/2022   HDL 86 01/11/2022   LDLCALC 109 (H) 01/11/2022   TRIG 69 01/11/2022   CHOLHDL 2.4 01/11/2022   Lab Results  Component Value Date   VD25OH 61.5 05/05/2022   VD25OH 71.2 01/11/2022   VD25OH 102.0 (H) 12/01/2021   Lab Results  Component Value Date   WBC 5.9 05/04/2022   HGB 12.7 05/04/2022   HCT 39.6 05/04/2022   MCV 86.7 05/04/2022   PLT 356 05/04/2022   No results found for: "IRON", "TIBC",  "FERRITIN"  Attestation Statements:   Reviewed by clinician on day of visit: allergies, medications, problem list, medical history, surgical history, family history, social history, and previous encounter notes.  I have reviewed the above documentation for accuracy and completeness, and I agree with the above. -  Jahmad Petrich d. Chyna Kneece, NP-C

## 2023-01-05 LAB — COMPREHENSIVE METABOLIC PANEL
ALT: 27 IU/L (ref 0–32)
AST: 22 IU/L (ref 0–40)
Albumin/Globulin Ratio: 1.9 (ref 1.2–2.2)
Albumin: 4.6 g/dL (ref 3.8–4.9)
Alkaline Phosphatase: 125 IU/L — ABNORMAL HIGH (ref 44–121)
BUN/Creatinine Ratio: 21 (ref 9–23)
BUN: 18 mg/dL (ref 6–24)
Bilirubin Total: 0.3 mg/dL (ref 0.0–1.2)
CO2: 23 mmol/L (ref 20–29)
Calcium: 9.6 mg/dL (ref 8.7–10.2)
Chloride: 104 mmol/L (ref 96–106)
Creatinine, Ser: 0.87 mg/dL (ref 0.57–1.00)
Globulin, Total: 2.4 g/dL (ref 1.5–4.5)
Glucose: 121 mg/dL — ABNORMAL HIGH (ref 70–99)
Potassium: 5.1 mmol/L (ref 3.5–5.2)
Sodium: 143 mmol/L (ref 134–144)
Total Protein: 7 g/dL (ref 6.0–8.5)
eGFR: 77 mL/min/{1.73_m2} (ref >59)

## 2023-01-05 LAB — HEMOGLOBIN A1C
Est. average glucose Bld gHb Est-mCnc: 120 mg/dL
Hgb A1c MFr Bld: 5.8 % — ABNORMAL HIGH (ref 4.8–5.6)

## 2023-01-05 LAB — VITAMIN B12: Vitamin B-12: 1016 pg/mL (ref 232–1245)

## 2023-01-05 LAB — INSULIN, RANDOM: INSULIN: 14 u[IU]/mL (ref 2.6–24.9)

## 2023-01-05 LAB — VITAMIN D 25 HYDROXY (VIT D DEFICIENCY, FRACTURES): Vit D, 25-Hydroxy: 57.1 ng/mL (ref 30.0–100.0)

## 2023-01-13 ENCOUNTER — Ambulatory Visit
Admission: RE | Admit: 2023-01-13 | Discharge: 2023-01-13 | Disposition: A | Discharge status: Home / Self Care | Payer: Medicare HMO | Source: Ambulatory Visit | Attending: Orthopaedic Surgery | Admitting: Orthopaedic Surgery

## 2023-01-13 DIAGNOSIS — M25519 Pain in unspecified shoulder: Secondary | ICD-10-CM

## 2023-01-13 DIAGNOSIS — M25511 Pain in right shoulder: Secondary | ICD-10-CM | POA: Diagnosis not present

## 2023-01-13 DIAGNOSIS — Z471 Aftercare following joint replacement surgery: Secondary | ICD-10-CM | POA: Diagnosis not present

## 2023-01-13 DIAGNOSIS — Z96611 Presence of right artificial shoulder joint: Secondary | ICD-10-CM | POA: Diagnosis not present

## 2023-01-13 MED ORDER — IOPAMIDOL (ISOVUE-M 200) INJECTION 41%
15.0000 mL | Freq: Once | INTRAMUSCULAR | Status: AC
  Administered 2023-01-13: 15 mL via INTRA_ARTICULAR

## 2023-01-16 DIAGNOSIS — Z1231 Encounter for screening mammogram for malignant neoplasm of breast: Secondary | ICD-10-CM | POA: Diagnosis not present

## 2023-01-16 DIAGNOSIS — M170 Bilateral primary osteoarthritis of knee: Secondary | ICD-10-CM | POA: Diagnosis not present

## 2023-01-24 DIAGNOSIS — M25511 Pain in right shoulder: Secondary | ICD-10-CM | POA: Diagnosis not present

## 2023-01-31 NOTE — Progress Notes (Addendum)
COVID Vaccine Completed: yes  Date of COVID positive in last 90 days: no  PCP - Dr. Cyndia Bent Cardiologist - n/a  Chest x-ray - 11/29/22 [facility] EKG - 05/04/22 Epic Stress Test - n/a ECHO - n/a Cardiac Cath - n/a Pacemaker/ICD device last checked: n/a Spinal Cord Stimulator: n/a  Bowel Prep - no  Sleep Study - yes CPAP - no  Fasting Blood Sugar - pre DM Checks Blood Sugar no checks at home  Last dose of GLP1 agonist-  N/A GLP1 instructions:  N/A   Last dose of SGLT-2 inhibitors-  N/A SGLT-2 instructions: N/A   Blood Thinner Instructions:  Aspirin Instructions: ASA 81, hold 7 days Last Dose:  Activity level: Can go up a flight of stairs and perform activities of daily living without stopping and without symptoms of chest pain or shortness of breath.  Anesthesia review:   Patient denies shortness of breath, fever, cough and chest pain at PAT appointment  Patient verbalized understanding of instructions that were given to them at the PAT appointment. Patient was also instructed that they will need to review over the PAT instructions again at home before surgery.

## 2023-02-02 ENCOUNTER — Ambulatory Visit (INDEPENDENT_AMBULATORY_CARE_PROVIDER_SITE_OTHER): Payer: Medicare HMO | Admitting: Adult Health

## 2023-02-02 ENCOUNTER — Encounter (INDEPENDENT_AMBULATORY_CARE_PROVIDER_SITE_OTHER): Payer: Self-pay | Admitting: Adult Health

## 2023-02-02 VITALS — BP 105/71 | HR 70 | Temp 97.9°F | Ht 62.0 in | Wt 224.0 lb

## 2023-02-02 DIAGNOSIS — Z6841 Body Mass Index (BMI) 40.0 and over, adult: Secondary | ICD-10-CM | POA: Diagnosis not present

## 2023-02-02 DIAGNOSIS — R7303 Prediabetes: Secondary | ICD-10-CM

## 2023-02-02 DIAGNOSIS — E669 Obesity, unspecified: Secondary | ICD-10-CM

## 2023-02-02 DIAGNOSIS — E559 Vitamin D deficiency, unspecified: Secondary | ICD-10-CM

## 2023-02-02 DIAGNOSIS — Z96611 Presence of right artificial shoulder joint: Secondary | ICD-10-CM | POA: Diagnosis not present

## 2023-02-02 DIAGNOSIS — E66813 Obesity, class 3: Secondary | ICD-10-CM

## 2023-02-02 DIAGNOSIS — E875 Hyperkalemia: Secondary | ICD-10-CM

## 2023-02-02 MED ORDER — METFORMIN HCL 500 MG PO TABS: ORAL_TABLET | ORAL | 0 refills | Status: DC

## 2023-02-02 NOTE — Progress Notes (Signed)
WEIGHT SUMMARY AND BIOMETRICS  Vitals Temp: 97.9 F (36.6 C) BP: 105/71 Pulse Rate: 70 SpO2: 99 %   Anthropometric Measurements Height: 5\' 2"  (1.575 m) Weight: 224 lb (101.6 kg) BMI (Calculated): 40.96 Weight at Last Visit: 225lb Weight Lost Since Last Visit: 1lb Weight Gained Since Last Visit: 0 Starting Weight: 256lb Total Weight Loss (lbs): 32 lb (14.5 kg)   Body Composition  Body Fat %: 49.4 % Fat Mass (lbs): 110.8 lbs Muscle Mass (lbs): 107.8 lbs Total Body Water (lbs): 77.6 lbs Visceral Fat Rating : 16   Other Clinical Data Fasting: Yes Labs: No Today's Visit #: 23 Starting Date: 11/18/19    Chief Complaint:   OBESITY Melissa [name] is here to discuss her progress with her obesity treatment plan. She is on the the Category 3 Plan and states she is following her eating plan approximately 70 % of the time.  She states she is not currently exercising.   Interim History:  Melissa [name] continued to experience R shoulder pain and immobility. 01/13/2023 CT SHOULDER RIGHT W CONTRAST  IMPRESSION: 1. Right total shoulder arthroplasty. Glenoid component is dislodged and displaced along the anterior inferior aspect of the joint.  Dr. Oda Kilts will perform R Total Shoulder Revision at [facility] February 15, 2023.  Subjective:   1. H/O total shoulder replacement, right She is scheduled to have R total shoulder- revision. Dr. Oda Kilts will perform procedure on 02/15/2023- likely hospitalized overnight. We discussed meal planning/prepping for recovery.  2. Pre-diabetes Discussed Labs  Latest Reference Range & Units 01/04/23 08:54  Glucose 70 - 99 mg/dL 355 (H)  Hemoglobin D3U 4.8 - 5.6 % 5.8 (H)  Est. average glucose Bld gHb Est-mCnc mg/dL 202  INSULIN 2.6 - 54.2 uIU/mL 14.0  (H): Data is abnormally high She is on Metformin 500mg  QD- denies GI upset.  3. Hyperkalemia Dicussed Labs- K+ level normal  Latest Reference Range & Units 01/11/22 12:17 05/04/22 09:25  01/04/23 08:54  Potassium 3.5 - 5.2 mmol/L 5.6 (H) 4.4 5.1  (H): Data is abnormally high She denies palpitations  4. Vitamin D deficiency Discussed Labs  Latest Reference Range & Units 01/11/22 12:17 05/05/22 09:20 01/04/23 08:54  Vitamin D, 25-Hydroxy 30.0 - 100.0 ng/mL 71.2 61.5 57.1  She is OTC Vit D3 1,000 and OTC Multivitamin   Assessment/Plan:   1. H/O total shoulder replacement, right Increase protein before and after surgery.  2. Pre-diabetes Refill - metFORMIN (GLUCOPHAGE) 500 MG tablet; 1 tab at breakfast  Dispense: 180 tablet; Refill: 0  3. Hyperkalemia Monitor labs  4. Vitamin D deficiency Continue OTC Vit D3 supplementation.  Melissa [name] is currently in the action stage of change. As such, her goal is to continue with weight loss efforts. She has agreed to the Category 3 Plan.   Prepare high protein meals- FREEZE and stock for post surgical consumption.  Exercise goals: All adults should avoid inactivity. Some physical activity is better than none, and adults who participate in any amount of physical activity gain some health benefits.  Behavioral modification strategies: increasing lean protein intake, decreasing simple carbohydrates, increasing vegetables, increasing water intake, meal planning and cooking strategies, and planning for success.  Melissa [name] has agreed to follow-up with our clinic in 6 weeks. She was informed of the importance of frequent follow-up visits to maximize her success with intensive lifestyle modifications for her multiple health conditions.   Objective:   Blood pressure 105/71, pulse 70, temperature 97.9 F (36.6 C), height 5\' 2"  (1.575 m),  weight 224 lb (101.6 kg), last menstrual period 10/12/2012, SpO2 99 %. Body mass index is 40.97 kg/m.  General: Cooperative, alert, well developed, in no acute distress. HEENT: Conjunctivae and lids unremarkable. Cardiovascular: Regular rhythm.  Lungs: Normal work of breathing. Neurologic: No focal  deficits.   Lab Results  Component Value Date   CREATININE 0.87 01/04/2023   BUN 18 01/04/2023   NA 143 01/04/2023   K 5.1 01/04/2023   CL 104 01/04/2023   CO2 23 01/04/2023   Lab Results  Component Value Date   ALT 27 01/04/2023   AST 22 01/04/2023   ALKPHOS 125 (H) 01/04/2023   BILITOT 0.3 01/04/2023   Lab Results  Component Value Date   HGBA1C 5.8 (H) 01/04/2023   HGBA1C 5.7 (H) 05/05/2022   HGBA1C 5.7 (H) 01/11/2022   HGBA1C 5.6 12/01/2021   HGBA1C 5.7 (H) 07/15/2021   Lab Results  Component Value Date   INSULIN 14.0 01/04/2023   INSULIN 10.6 05/05/2022   INSULIN 8.3 01/11/2022   INSULIN 12.5 12/01/2021   INSULIN 8.8 07/15/2021   Lab Results  Component Value Date   TSH 1.580 12/01/2021   Lab Results  Component Value Date   CHOL 207 (H) 01/11/2022   HDL 86 01/11/2022   LDLCALC 109 (H) 01/11/2022   TRIG 69 01/11/2022   CHOLHDL 2.4 01/11/2022   Lab Results  Component Value Date   VD25OH 57.1 01/04/2023   VD25OH 61.5 05/05/2022   VD25OH 71.2 01/11/2022   Lab Results  Component Value Date   WBC 5.9 05/04/2022   HGB 12.7 05/04/2022   HCT 39.6 05/04/2022   MCV 86.7 05/04/2022   PLT 356 05/04/2022   No results found for: "IRON", "TIBC", "FERRITIN"  Attestation Statements:   Reviewed by clinician on day of visit: allergies, medications, problem list, medical history, surgical history, family history, social history, and previous encounter notes.  I have reviewed the above documentation for accuracy and completeness, and I agree with the above. -  Calandria Mullings d. Posey Petrik, NP-C

## 2023-02-03 ENCOUNTER — Encounter (HOSPITAL_COMMUNITY): Payer: Self-pay

## 2023-02-03 ENCOUNTER — Encounter (HOSPITAL_COMMUNITY)
Admission: RE | Admit: 2023-02-03 | Discharge: 2023-02-03 | Disposition: A | Discharge status: Home / Self Care | Payer: Medicare HMO | Source: Ambulatory Visit | Attending: Orthopaedic Surgery | Admitting: Orthopaedic Surgery

## 2023-02-03 ENCOUNTER — Other Ambulatory Visit: Payer: Self-pay

## 2023-02-03 VITALS — BP 154/80 | HR 77 | Temp 98.5°F | Resp 14 | Ht 62.0 in | Wt 226.0 lb

## 2023-02-03 DIAGNOSIS — Z01812 Encounter for preprocedural laboratory examination: Secondary | ICD-10-CM | POA: Diagnosis not present

## 2023-02-03 DIAGNOSIS — I10 Essential (primary) hypertension: Secondary | ICD-10-CM | POA: Insufficient documentation

## 2023-02-03 DIAGNOSIS — K769 Liver disease, unspecified: Secondary | ICD-10-CM | POA: Insufficient documentation

## 2023-02-03 DIAGNOSIS — R7303 Prediabetes: Secondary | ICD-10-CM | POA: Insufficient documentation

## 2023-02-03 DIAGNOSIS — Z01818 Encounter for other preprocedural examination: Secondary | ICD-10-CM

## 2023-02-03 HISTORY — DX: Dizziness and giddiness: R42

## 2023-02-03 HISTORY — DX: Personal history of urinary calculi: Z87.442

## 2023-02-03 HISTORY — DX: Prediabetes: R73.03

## 2023-02-03 LAB — COMPREHENSIVE METABOLIC PANEL
ALT: 21 U/L (ref 0–44)
AST: 17 U/L (ref 15–41)
Albumin: 4.2 g/dL (ref 3.5–5.0)
Alkaline Phosphatase: 88 U/L (ref 38–126)
Anion gap: 9 (ref 5–15)
BUN: 18 mg/dL (ref 6–20)
CO2: 26 mmol/L (ref 22–32)
Calcium: 9.5 mg/dL (ref 8.9–10.3)
Chloride: 103 mmol/L (ref 98–111)
Creatinine, Ser: 0.88 mg/dL (ref 0.44–1.00)
GFR, Estimated: >60 mL/min (ref >60)
Glucose, Bld: 105 mg/dL — ABNORMAL HIGH (ref 70–99)
Potassium: 4.5 mmol/L (ref 3.5–5.1)
Sodium: 138 mmol/L (ref 135–145)
Total Bilirubin: 0.6 mg/dL (ref 0.3–1.2)
Total Protein: 7.5 g/dL (ref 6.5–8.1)

## 2023-02-03 LAB — TYPE AND SCREEN
ABO/RH(D): O POS
Antibody Screen: NEGATIVE

## 2023-02-03 LAB — CBC
HCT: 41 % (ref 36.0–46.0)
Hemoglobin: 12.7 g/dL (ref 12.0–15.0)
MCH: 27 pg (ref 26.0–34.0)
MCHC: 31 g/dL (ref 30.0–36.0)
MCV: 87.2 fL (ref 80.0–100.0)
Platelets: 354 10*3/uL (ref 150–400)
RBC: 4.7 MIL/uL (ref 3.87–5.11)
RDW: 14.9 % (ref 11.5–15.5)
WBC: 8.2 10*3/uL (ref 4.0–10.5)
nRBC: 0 % (ref 0.0–0.2)

## 2023-02-03 LAB — SURGICAL PCR SCREEN
MRSA, PCR: NEGATIVE
Staphylococcus aureus: NEGATIVE

## 2023-02-03 LAB — GLUCOSE, CAPILLARY: Glucose-Capillary: 101 mg/dL — ABNORMAL HIGH (ref 70–99)

## 2023-02-03 NOTE — Patient Instructions (Addendum)
[facility]- Preparing for Total Shoulder Arthroplasty  °  °Before surgery, you can play an important role. Because skin is not sterile, your skin needs to be as free of germs as possible. You can reduce the number of germs on your skin by using the following products. °Benzoyl Peroxide Gel °Reduces the number of germs present on the skin °Applied twice a day to shoulder area starting two days before surgery   ° °================================================================== ° °Please follow these instructions carefully: ° °BENZOYL PEROXIDE 5% GEL ° °Please do not use if you have an allergy to benzoyl peroxide.   If your skin becomes reddened/irritated stop using the benzoyl peroxide. ° °Starting two days before surgery, apply as follows: °Apply benzoyl peroxide in the morning and at night. Apply after taking a shower. If you are not taking a shower clean entire shoulder front, back, and side along with the armpit with a clean wet washcloth. ° °Place a quarter-sized dollop on your shoulder and rub in thoroughly, making sure to cover the front, back, and side of your shoulder, along with the armpit.  ° °2 days before ____ AM   ____ PM              1 day before ____ AM   ____ PM °                        °Do this twice a day for two days.  (Last application is the night before surgery, AFTER using the CHG soap as described below). ° °Do NOT apply benzoyl peroxide gel on the day of surgery.  °

## 2023-02-04 DIAGNOSIS — Z008 Encounter for other general examination: Secondary | ICD-10-CM | POA: Diagnosis not present

## 2023-02-04 DIAGNOSIS — M1990 Unspecified osteoarthritis, unspecified site: Secondary | ICD-10-CM | POA: Diagnosis not present

## 2023-02-04 DIAGNOSIS — R69 Illness, unspecified: Secondary | ICD-10-CM | POA: Diagnosis not present

## 2023-02-04 DIAGNOSIS — J309 Allergic rhinitis, unspecified: Secondary | ICD-10-CM | POA: Diagnosis not present

## 2023-02-04 DIAGNOSIS — F419 Anxiety disorder, unspecified: Secondary | ICD-10-CM | POA: Diagnosis not present

## 2023-02-04 DIAGNOSIS — E785 Hyperlipidemia, unspecified: Secondary | ICD-10-CM | POA: Diagnosis not present

## 2023-02-04 DIAGNOSIS — R32 Unspecified urinary incontinence: Secondary | ICD-10-CM | POA: Diagnosis not present

## 2023-02-04 DIAGNOSIS — E1142 Type 2 diabetes mellitus with diabetic polyneuropathy: Secondary | ICD-10-CM | POA: Diagnosis not present

## 2023-02-04 DIAGNOSIS — M4800 Spinal stenosis, site unspecified: Secondary | ICD-10-CM | POA: Diagnosis not present

## 2023-02-04 DIAGNOSIS — K219 Gastro-esophageal reflux disease without esophagitis: Secondary | ICD-10-CM | POA: Diagnosis not present

## 2023-02-04 DIAGNOSIS — R42 Dizziness and giddiness: Secondary | ICD-10-CM | POA: Diagnosis not present

## 2023-02-04 DIAGNOSIS — I10 Essential (primary) hypertension: Secondary | ICD-10-CM | POA: Diagnosis not present

## 2023-02-04 DIAGNOSIS — E039 Hypothyroidism, unspecified: Secondary | ICD-10-CM | POA: Diagnosis not present

## 2023-02-14 NOTE — H&P (Signed)
PREOPERATIVE H&P  Chief Complaint: hardware complication right shoulder  HPI: Melissa [name] is a 57 y.o. female who presents for preoperative history and physical prior to scheduled surgery, Procedure(s): TOTAL SHOULDER REVISION.   Patient has a past medical history significant for HTN, GERD.   The patient is a 57 year old who was sent over to [name] by Julien Girt, PA-C for her right shoulder.  She had a right total shoulder arthroplasty done with [facility] in 2021.  She states that she has had continued pain in her right shoulder since then.  She notes it is intermittent pain with range of motion.  It is a burning and stabbing pain at her incision.  She does have a history of degenerative disc disease that she sees Dr. Shon Baton for, but she is unsure if this is related to her shoulder.  She denies any numbness or tingling going down the right arm.  She is right hand dominant.    Symptoms are rated as moderate to severe, and have been worsening.  This is significantly impairing activities of daily living.    Please see clinic note for further details on this patient's care.    She has elected for surgical management.   Past Medical History:  Diagnosis Date   Allergic rhinitis    Anxiety    Arthritis    Back pain    Constipated    Depression    Diaphragmatic hernia    Fatty liver    GERD (gastroesophageal reflux disease)    History of kidney stones    Hypertension    Hypothyroidism    IBS (irritable bowel syndrome)    Joint pain    Kidney problem    Lactose intolerance    Morbid obesity    OSA (obstructive sleep apnea)    Osteoarthritis    Pre-diabetes    Seasonal allergies    Tinnitus    Vertigo    Vitamin D deficiency    Past Surgical History:  Procedure Laterality Date   ABDOMINAL EXPOSURE N/A 05/09/2022   Procedure: ABDOMINAL EXPOSURE;  Surgeon: Cephus Shelling, MD;  Location: [facility] OR;  Service: Vascular;  Laterality: N/A;   CARPAL TUNNEL RELEASE      rt/lt   COLONOSCOPY     FINGER ARTHRODESIS  11/08/2011   Procedure: ARTHRODESIS FINGER;  Surgeon: Wyn Forster., MD;  Location: [facility];  Service: Orthopedics;  Laterality: Left;  left thumb carpal-metacarpal fusion   KNEE ARTHROSCOPY     both knees   OBLIQUE LUMBAR INTERBODY FUSION 1 LEVEL WITH PERCUTANEOUS SCREWS N/A 05/09/2022   Procedure: OBLIQUE LUMBAR INTERBODY FUSION 1 LEVEL WITH PERCUTANEOUS SCREWS (OLIF L4-5 WITH POSTERIOR SPINAL FUSION INTERBODY);  Surgeon: Venita Lick, MD;  Location: [facility] OR;  Service: Orthopedics;  Laterality: N/A;  4 hrs Dr. 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, has one functioning kidney   WISDOM TOOTH EXTRACTION     Social History   Socioeconomic History   Marital status: Single  Spouse name: Not on file   Number of children: Not on file   Years of education: Not on file   Highest education level: Not on file  Occupational History   Occupation: [employer]  Tobacco Use   Smoking status: Never   Smokeless tobacco: Never  Vaping Use   Vaping Use: Never used  Substance and Sexual Activity   Alcohol use: No    Comment: Quit 13 years ago   Drug use: Never   Sexual activity: Not on file  Other Topics Concern   Not on file  Social History Narrative   Lives home alone, (has dogs and cats).  Disabled, due to back.  Education 2 college degree's.  Coffee/ sodas 2-3 daily   Social Determinants of Health   Financial Resource Strain: Not on file  Food  Insecurity: Not on file  Transportation Needs: Not on file  Physical Activity: Not on file  Stress: Not on file  Social Connections: Not on file   Family History  Problem Relation Age of Onset   Arthritis Mother        spianl stenosis   Atrial fibrillation Mother    Transient ischemic attack Mother    High Cholesterol Mother    Diabetes Father    Heart attack Father        3 stents   Heart disease Father    Atrial fibrillation Sister    Allergies  Allergen Reactions   Bee Venom Hives   Lactose Intolerance (Gi)    Prior to Admission medications   Medication Sig Start Date End Date Taking? Authorizing Provider  acetaminophen (TYLENOL) 500 MG tablet Take 1,000 mg by mouth every 6 (six) hours as needed for moderate pain.   Yes [provider]  Ascorbic Acid (VITAMIN C) 1000 MG tablet Take 3,000 mg by mouth at bedtime.   Yes [provider]  aspirin EC 81 MG tablet Take 81 mg by mouth daily. Swallow whole.   Yes [provider]  B Complex-C (B-COMPLEX WITH VITAMIN C) tablet Take 1 tablet by mouth at bedtime.    Yes [provider]  BREYNA 80-4.5 MCG/ACT inhaler Inhale 2 puffs into the lungs daily as needed.   Yes [provider]  buPROPion (WELLBUTRIN XL) 300 MG 24 hr tablet Take 300 mg by mouth daily. 05/14/20  Yes [provider]  busPIRone (BUSPAR) 5 MG tablet Take 5 mg by mouth 2 (two) times daily as needed for anxiety. 06/15/20  Yes [provider]  chlorpheniramine (CHLOR-TRIMETON) 4 MG tablet Take 4 mg by mouth daily.   Yes [provider]  cholecalciferol (VITAMIN D3) 25 MCG (1000 UNIT) tablet Take 1,000 Units by mouth daily.   Yes [provider]  diphenhydrAMINE (BENADRYL) 25 mg capsule Take 1 capsule (25 mg total) by mouth every 6 (six) hours as needed for itching, allergies or sleep. 11/06/12  Yes Babish, Molli Hazard, PA-C  FLUoxetine (PROZAC) 40 MG capsule Take 40 mg by mouth daily.   Yes [provider]  levothyroxine (SYNTHROID) 125 MCG tablet Take 125 mcg by mouth daily before breakfast. 10/20/21  Yes [provider]  losartan (COZAAR) 25 MG tablet Take 25 mg by mouth daily.   Yes [provider]  meclizine (ANTIVERT) 25 MG tablet Take 25 mg by mouth 3 (three) times daily as needed for dizziness.   Yes [provider]  montelukast (SINGULAIR) 10 MG tablet Take 10 mg by mouth at bedtime.   Yes [provider]  mupirocin ointment (BACTROBAN) 2 % Apply 1 Application topically daily as needed (irritation).   Yes [provider]  Naphazoline-Pheniramine ([employer] EYE ALLERGY RELIEF OP) Place 1 drop into both eyes daily as needed (allergies).   Yes [provider]  omeprazole (PRILOSEC) 40 MG capsule Take 40 mg by mouth at bedtime.    Yes [provider]  pravastatin (PRAVACHOL) 80 MG tablet Take 80 mg by mouth at bedtime. 10/20/21  Yes [provider]  triamcinolone cream (KENALOG) 0.5 % Apply 1 Application topically daily as needed (irritation).   Yes [provider]  amoxicillin-clavulanate (AUGMENTIN) 875-125 MG tablet Take 1 tablet by mouth every 12 (twelve) hours. 12/15/22   Roemhildt, Lorin T, PA-C  EPINEPHrine 0.3 mg/0.3 mL IJ SOAJ injection Inject 0.3 mg into the muscle as needed for anaphylaxis. 04/23/13   [provider]  metFORMIN (GLUCOPHAGE) 500 MG tablet 1 tab at breakfast 02/02/23   Danford, Orpha Bur D, NP  ondansetron (ZOFRAN) 4 MG tablet Take 1 tablet (4 mg total) by mouth every 8 (eight) hours as needed for nausea or vomiting. 05/09/22   Venita Lick, MD    ROS: All other systems have been reviewed and were otherwise negative with the exception of those mentioned in the HPI and as above.  Physical Exam: General: Alert, no acute distress Cardiovascular: No pedal edema Respiratory: No cyanosis, no use of accessory musculature GI: No organomegaly, abdomen is soft and non-tender Skin: No  lesions in the area of chief complaint Neurologic: Sensation intact distally Psychiatric: Patient is competent for consent with normal mood and affect Lymphatic: No axillary or cervical lymphadenopathy  MUSCULOSKELETAL:  On examination the range of motion of the shoulder is to 170 degrees. External rotation to 30 degrees. Internal rotation to the belt line. Cuff strength is weak with supraspinatus testing. She has pain with testing.   Imaging: CT of the shoulder is reviewed and demonstrates a completely loose glenoid implant sitting in the inferior axillary recess. The cuff appears to be intact.   BMI: Estimated body mass index is 41.34 kg/m as calculated from the following:   Height as of 02/03/23:  (1.575 m).   Weight as of 02/03/23: 102.5 kg.  Lab Results  Component Value Date   ALBUMIN 4.2 02/03/2023   Diabetes: Patient does not have a diagnosis of diabetes. Lab Results  Component Value Date   HGBA1C 5.8 (H) 01/04/2023     Smoking Status:   reports that she has never smoked. She has never used smokeless tobacco.    Assessment: hardware complication right shoulder  Plan: Plan for Procedure(s): TOTAL SHOULDER REVISION  She has an Arthrex implant. We will have to remove this and likely convert to a reverse. We talked about the possibility of continued infection and the possibility of a two stage procedure, however I do not think that will be likely. She will stay overnight as this is an inpatient only code. She will come back to see [name] post operatively.  We will give her a sling today.   The risks benefits and alternatives were discussed with the patient including but not limited to the risks of nonoperative treatment, versus surgical intervention including infection, bleeding, nerve injury,  blood clots, cardiopulmonary complications, morbidity, mortality, among others, and they were willing to proceed.   We additionally specifically discussed risks of axillary nerve  injury, infection, periprosthetic fracture, continued pain and longevity of implants prior to beginning procedure.    Patient will be  admitted for inpatient treatment for surgery, pain control, OT, prophylactic antibiotics, VTE prophylaxis, and discharge planning. The patient is planning to be discharged home with outpatient PT.   The patient acknowledged the explanation, agreed to proceed with the plan and consent was signed.   Operative Plan: Right revision reverse total shoulder arthroplasty Discharge Medications: standard DVT Prophylaxis: aspirin Physical Therapy: outpatient PT - delayed Special Discharge needs: Sling (should bring with her). IceMan   Vernetta Honey, PA-C  02/14/2023 8:45 AM

## 2023-02-14 NOTE — Progress Notes (Signed)
Pt aware to arrive at [facility] admitting at 0515 on Wed 02/15/23 for scheduled surgical procedure. No food after midnight; clear liquids from midnight till 0430 then nothing by mouth.

## 2023-02-14 NOTE — Anesthesia Preprocedure Evaluation (Signed)
Anesthesia Evaluation  Patient identified by MRN, date of birth, ID band Patient awake    Reviewed: Allergy & Precautions, NPO status , Patient's Chart, lab work & pertinent test results  Airway Mallampati: II  TM Distance: >3 FB Neck ROM: Full    Dental no notable dental hx.    Pulmonary sleep apnea    Pulmonary exam normal        Cardiovascular hypertension, Pt. on medications Normal cardiovascular exam     Neuro/Psych  PSYCHIATRIC DISORDERS Anxiety Depression    negative neurological ROS     GI/Hepatic Neg liver ROS,GERD  Medicated and Controlled,,  Endo/Other  Hypothyroidism  Morbid obesity  Renal/GU negative Renal ROS     Musculoskeletal  (+) Arthritis ,    Abdominal  (+) + obese  Peds  Hematology negative hematology ROS (+)   Anesthesia Other Findings hardware complication right shoulder  Reproductive/Obstetrics                             Anesthesia Physical Anesthesia Plan  ASA: 3  Anesthesia Plan: General and Regional   Post-op Pain Management: Regional block*   Induction: Intravenous  PONV Risk Score and Plan: 3 and Ondansetron, Dexamethasone, Midazolam and Treatment may vary due to age or medical condition  Airway Management Planned: Oral ETT  Additional Equipment:   Intra-op Plan:   Post-operative Plan: Extubation in OR  Informed Consent: I have reviewed the patients History and Physical, chart, labs and discussed the procedure including the risks, benefits and alternatives for the proposed anesthesia with the patient or authorized representative who has indicated his/her understanding and acceptance.     Dental advisory given  Plan Discussed with: CRNA  Anesthesia Plan Comments:        Anesthesia Quick Evaluation

## 2023-02-15 ENCOUNTER — Encounter (HOSPITAL_COMMUNITY): Payer: Self-pay | Admitting: Orthopaedic Surgery

## 2023-02-15 ENCOUNTER — Other Ambulatory Visit: Payer: Self-pay

## 2023-02-15 ENCOUNTER — Encounter (HOSPITAL_COMMUNITY)
Admission: RE | Disposition: A | Discharge status: Home / Self Care | Payer: Self-pay | Source: Home / Self Care | Attending: Orthopaedic Surgery

## 2023-02-15 ENCOUNTER — Inpatient Hospital Stay (HOSPITAL_COMMUNITY)
Admission: RE | Admit: 2023-02-15 | Discharge: 2023-02-16 | DRG: 483 | Disposition: A | Discharge status: Home / Self Care | Payer: Medicare HMO | Attending: Orthopaedic Surgery | Admitting: Orthopaedic Surgery

## 2023-02-15 ENCOUNTER — Inpatient Hospital Stay (HOSPITAL_COMMUNITY): Payer: Medicare HMO

## 2023-02-15 ENCOUNTER — Inpatient Hospital Stay (HOSPITAL_COMMUNITY): Payer: Medicare HMO | Admitting: Anesthesiology

## 2023-02-15 DIAGNOSIS — G4730 Sleep apnea, unspecified: Secondary | ICD-10-CM

## 2023-02-15 DIAGNOSIS — G4733 Obstructive sleep apnea (adult) (pediatric): Secondary | ICD-10-CM | POA: Diagnosis present

## 2023-02-15 DIAGNOSIS — K219 Gastro-esophageal reflux disease without esophagitis: Secondary | ICD-10-CM | POA: Diagnosis present

## 2023-02-15 DIAGNOSIS — F419 Anxiety disorder, unspecified: Secondary | ICD-10-CM | POA: Diagnosis present

## 2023-02-15 DIAGNOSIS — F32A Depression, unspecified: Secondary | ICD-10-CM | POA: Diagnosis not present

## 2023-02-15 DIAGNOSIS — E739 Lactose intolerance, unspecified: Secondary | ICD-10-CM | POA: Diagnosis present

## 2023-02-15 DIAGNOSIS — T8484XA Pain due to internal orthopedic prosthetic devices, implants and grafts, initial encounter: Secondary | ICD-10-CM | POA: Diagnosis present

## 2023-02-15 DIAGNOSIS — Z83438 Family history of other disorder of lipoprotein metabolism and other lipidemia: Secondary | ICD-10-CM | POA: Diagnosis not present

## 2023-02-15 DIAGNOSIS — Z7984 Long term (current) use of oral hypoglycemic drugs: Secondary | ICD-10-CM

## 2023-02-15 DIAGNOSIS — Z96611 Presence of right artificial shoulder joint: Principal | ICD-10-CM

## 2023-02-15 DIAGNOSIS — T84018A Broken internal joint prosthesis, other site, initial encounter: Principal | ICD-10-CM | POA: Diagnosis present

## 2023-02-15 DIAGNOSIS — Z79890 Hormone replacement therapy (postmenopausal): Secondary | ICD-10-CM

## 2023-02-15 DIAGNOSIS — Z96651 Presence of right artificial knee joint: Secondary | ICD-10-CM | POA: Diagnosis not present

## 2023-02-15 DIAGNOSIS — M1990 Unspecified osteoarthritis, unspecified site: Secondary | ICD-10-CM | POA: Diagnosis present

## 2023-02-15 DIAGNOSIS — R7303 Prediabetes: Secondary | ICD-10-CM

## 2023-02-15 DIAGNOSIS — Z91030 Bee allergy status: Secondary | ICD-10-CM

## 2023-02-15 DIAGNOSIS — I10 Essential (primary) hypertension: Secondary | ICD-10-CM

## 2023-02-15 DIAGNOSIS — T84038A Mechanical loosening of other internal prosthetic joint, initial encounter: Secondary | ICD-10-CM | POA: Diagnosis not present

## 2023-02-15 DIAGNOSIS — E039 Hypothyroidism, unspecified: Secondary | ICD-10-CM

## 2023-02-15 DIAGNOSIS — Z471 Aftercare following joint replacement surgery: Secondary | ICD-10-CM | POA: Diagnosis not present

## 2023-02-15 DIAGNOSIS — Z7982 Long term (current) use of aspirin: Secondary | ICD-10-CM

## 2023-02-15 DIAGNOSIS — Y828 Other medical devices associated with adverse incidents: Secondary | ICD-10-CM | POA: Diagnosis not present

## 2023-02-15 DIAGNOSIS — Z8261 Family history of arthritis: Secondary | ICD-10-CM

## 2023-02-15 DIAGNOSIS — G8918 Other acute postprocedural pain: Secondary | ICD-10-CM | POA: Diagnosis not present

## 2023-02-15 DIAGNOSIS — Z981 Arthrodesis status: Secondary | ICD-10-CM | POA: Diagnosis not present

## 2023-02-15 DIAGNOSIS — Z8249 Family history of ischemic heart disease and other diseases of the circulatory system: Secondary | ICD-10-CM | POA: Diagnosis not present

## 2023-02-15 DIAGNOSIS — M19011 Primary osteoarthritis, right shoulder: Secondary | ICD-10-CM | POA: Diagnosis not present

## 2023-02-15 DIAGNOSIS — Z6841 Body Mass Index (BMI) 40.0 and over, adult: Secondary | ICD-10-CM | POA: Diagnosis not present

## 2023-02-15 DIAGNOSIS — Z833 Family history of diabetes mellitus: Secondary | ICD-10-CM

## 2023-02-15 DIAGNOSIS — Z79899 Other long term (current) drug therapy: Secondary | ICD-10-CM

## 2023-02-15 HISTORY — PX: TOTAL SHOULDER REVISION: SHX6130

## 2023-02-15 LAB — AEROBIC/ANAEROBIC CULTURE W GRAM STAIN (SURGICAL/DEEP WOUND): Gram Stain: NONE SEEN

## 2023-02-15 LAB — GLUCOSE, CAPILLARY
Glucose-Capillary: 105 mg/dL — ABNORMAL HIGH (ref 70–99)
Glucose-Capillary: 111 mg/dL — ABNORMAL HIGH (ref 70–99)

## 2023-02-15 SURGERY — REVISION, TOTAL ARTHROPLASTY, SHOULDER
Anesthesia: Regional | Site: Shoulder | Laterality: Right

## 2023-02-15 MED ORDER — ACETAMINOPHEN 500 MG PO TABS
1000.0000 mg | ORAL_TABLET | Freq: Once | ORAL | Status: DC
Start: 2023-02-15 — End: 2023-02-15

## 2023-02-15 MED ORDER — MIDAZOLAM HCL 2 MG/2ML IJ SOLN
INTRAMUSCULAR | Status: AC
  Filled 2023-02-15: qty 2

## 2023-02-15 MED ORDER — OXYCODONE HCL 5 MG PO TABS: 5.0000 mg | ORAL_TABLET | Freq: Once | ORAL | Status: DC | PRN

## 2023-02-15 MED ORDER — POLYETHYLENE GLYCOL 3350 17 G PO PACK: 17.0000 g | PACK | Freq: Every day | ORAL | Status: DC | PRN

## 2023-02-15 MED ORDER — PROPOFOL 10 MG/ML IV BOLUS
INTRAVENOUS | Status: DC | PRN
  Administered 2023-02-15: 50 mg via INTRAVENOUS
  Administered 2023-02-15: 160 mg via INTRAVENOUS
  Administered 2023-02-15: 40 mg via INTRAVENOUS
  Administered 2023-02-15: 50 mg via INTRAVENOUS

## 2023-02-15 MED ORDER — OXYCODONE HCL 5 MG PO TABS
5.0000 mg | ORAL_TABLET | Freq: Four times a day (QID) | ORAL | Status: DC | PRN
  Administered 2023-02-16: 5 mg via ORAL
  Filled 2023-02-15: qty 1

## 2023-02-15 MED ORDER — METOCLOPRAMIDE HCL 5 MG/ML IJ SOLN: 5.0000 mg | Freq: Three times a day (TID) | INTRAMUSCULAR | Status: DC | PRN

## 2023-02-15 MED ORDER — TRANEXAMIC ACID-NACL 1000-0.7 MG/100ML-% IV SOLN
1000.0000 mg | INTRAVENOUS | Status: AC
  Administered 2023-02-15: 1000 mg via INTRAVENOUS
  Filled 2023-02-15: qty 100

## 2023-02-15 MED ORDER — METFORMIN HCL 500 MG PO TABS
500.0000 mg | ORAL_TABLET | Freq: Every day | ORAL | Status: DC
  Administered 2023-02-16: 500 mg via ORAL
  Filled 2023-02-15: qty 1

## 2023-02-15 MED ORDER — LACTATED RINGERS IV SOLN: INTRAVENOUS | Status: DC

## 2023-02-15 MED ORDER — SODIUM CHLORIDE 0.9 % IR SOLN
Status: DC | PRN
  Administered 2023-02-15: 1000 mL

## 2023-02-15 MED ORDER — ROCURONIUM BROMIDE 10 MG/ML (PF) SYRINGE
PREFILLED_SYRINGE | INTRAVENOUS | Status: AC
  Filled 2023-02-15: qty 10

## 2023-02-15 MED ORDER — VANCOMYCIN HCL 1000 MG IV SOLR
INTRAVENOUS | Status: AC
  Filled 2023-02-15: qty 20

## 2023-02-15 MED ORDER — DIPHENHYDRAMINE HCL 25 MG PO CAPS: 25.0000 mg | ORAL_CAPSULE | Freq: Four times a day (QID) | ORAL | Status: DC | PRN

## 2023-02-15 MED ORDER — ONDANSETRON HCL 4 MG/2ML IJ SOLN
INTRAMUSCULAR | Status: AC
  Filled 2023-02-15: qty 2

## 2023-02-15 MED ORDER — FLUOXETINE HCL 20 MG PO CAPS
40.0000 mg | ORAL_CAPSULE | Freq: Every day | ORAL | Status: DC
  Administered 2023-02-16: 40 mg via ORAL
  Filled 2023-02-15 (×2): qty 2

## 2023-02-15 MED ORDER — VANCOMYCIN HCL 1000 MG IV SOLR
INTRAVENOUS | Status: DC | PRN
  Administered 2023-02-15: 1000 mg via TOPICAL

## 2023-02-15 MED ORDER — GLYCOPYRROLATE 0.2 MG/ML IJ SOLN
INTRAMUSCULAR | Status: DC | PRN
  Administered 2023-02-15: .2 mg via INTRAVENOUS

## 2023-02-15 MED ORDER — PANTOPRAZOLE SODIUM 40 MG PO TBEC
80.0000 mg | DELAYED_RELEASE_TABLET | Freq: Every day | ORAL | Status: DC
  Administered 2023-02-15 – 2023-02-16 (×2): 80 mg via ORAL
  Filled 2023-02-15 (×3): qty 2

## 2023-02-15 MED ORDER — PRAVASTATIN SODIUM 20 MG PO TABS
80.0000 mg | ORAL_TABLET | Freq: Every day | ORAL | Status: DC
  Administered 2023-02-15: 80 mg via ORAL
  Filled 2023-02-15: qty 4

## 2023-02-15 MED ORDER — BUPROPION HCL ER (XL) 300 MG PO TB24
300.0000 mg | ORAL_TABLET | Freq: Every day | ORAL | Status: DC
  Administered 2023-02-16: 300 mg via ORAL
  Filled 2023-02-15: qty 1

## 2023-02-15 MED ORDER — PHENYLEPHRINE HCL-NACL 20-0.9 MG/250ML-% IV SOLN
INTRAVENOUS | Status: DC | PRN
  Administered 2023-02-15: 40 ug/min via INTRAVENOUS

## 2023-02-15 MED ORDER — PHENYLEPHRINE 80 MCG/ML (10ML) SYRINGE FOR IV PUSH (FOR BLOOD PRESSURE SUPPORT)
PREFILLED_SYRINGE | INTRAVENOUS | Status: AC
  Filled 2023-02-15: qty 10

## 2023-02-15 MED ORDER — EPHEDRINE SULFATE-NACL 50-0.9 MG/10ML-% IV SOSY
PREFILLED_SYRINGE | INTRAVENOUS | Status: DC | PRN
  Administered 2023-02-15 (×2): 5 mg via INTRAVENOUS
  Administered 2023-02-15 (×3): 10 mg via INTRAVENOUS
  Administered 2023-02-15: 5 mg via INTRAVENOUS

## 2023-02-15 MED ORDER — HYDROMORPHONE HCL 1 MG/ML IJ SOLN: 0.5000 mg | INTRAMUSCULAR | Status: DC | PRN

## 2023-02-15 MED ORDER — BISACODYL 10 MG RE SUPP: 10.0000 mg | Freq: Every day | RECTAL | Status: DC | PRN

## 2023-02-15 MED ORDER — PHENYLEPHRINE HCL (PRESSORS) 10 MG/ML IV SOLN
INTRAVENOUS | Status: AC
  Filled 2023-02-15: qty 1

## 2023-02-15 MED ORDER — PHENOL 1.4 % MT LIQD: 1.0000 | OROMUCOSAL | Status: DC | PRN

## 2023-02-15 MED ORDER — CEFAZOLIN SODIUM-DEXTROSE 2-4 GM/100ML-% IV SOLN
2.0000 g | INTRAVENOUS | Status: AC
  Administered 2023-02-15: 2 g via INTRAVENOUS
  Filled 2023-02-15: qty 100

## 2023-02-15 MED ORDER — CELECOXIB 200 MG PO CAPS
200.0000 mg | ORAL_CAPSULE | Freq: Two times a day (BID) | ORAL | Status: DC
  Administered 2023-02-15 – 2023-02-16 (×3): 200 mg via ORAL
  Filled 2023-02-15 (×3): qty 1

## 2023-02-15 MED ORDER — CEFAZOLIN SODIUM-DEXTROSE 2-4 GM/100ML-% IV SOLN
2.0000 g | Freq: Four times a day (QID) | INTRAVENOUS | Status: AC
  Administered 2023-02-15 – 2023-02-16 (×3): 2 g via INTRAVENOUS
  Filled 2023-02-15 (×3): qty 100

## 2023-02-15 MED ORDER — BUPIVACAINE LIPOSOME 1.3 % IJ SUSP
INTRAMUSCULAR | Status: DC | PRN
  Administered 2023-02-15: 10 mL via PERINEURAL

## 2023-02-15 MED ORDER — ACETAMINOPHEN 500 MG PO TABS
1000.0000 mg | ORAL_TABLET | Freq: Four times a day (QID) | ORAL | Status: AC
  Administered 2023-02-15 – 2023-02-16 (×4): 1000 mg via ORAL
  Filled 2023-02-15 (×4): qty 2

## 2023-02-15 MED ORDER — MOMETASONE FURO-FORMOTEROL FUM 100-5 MCG/ACT IN AERO
2.0000 | INHALATION_SPRAY | Freq: Two times a day (BID) | RESPIRATORY_TRACT | Status: DC | PRN
  Administered 2023-02-15: 2 via RESPIRATORY_TRACT
  Filled 2023-02-15 (×2): qty 8.8

## 2023-02-15 MED ORDER — OXYCODONE HCL 5 MG/5ML PO SOLN: 5.0000 mg | Freq: Once | ORAL | Status: DC | PRN

## 2023-02-15 MED ORDER — FENTANYL CITRATE (PF) 100 MCG/2ML IJ SOLN
INTRAMUSCULAR | Status: DC | PRN
  Administered 2023-02-15 (×2): 50 ug via INTRAVENOUS

## 2023-02-15 MED ORDER — MONTELUKAST SODIUM 10 MG PO TABS
10.0000 mg | ORAL_TABLET | Freq: Every day | ORAL | Status: DC
  Administered 2023-02-15: 10 mg via ORAL
  Filled 2023-02-15: qty 1

## 2023-02-15 MED ORDER — DEXAMETHASONE SODIUM PHOSPHATE 10 MG/ML IJ SOLN
INTRAMUSCULAR | Status: DC | PRN
  Administered 2023-02-15: 10 mg via INTRAVENOUS

## 2023-02-15 MED ORDER — METHOCARBAMOL 500 MG PO TABS
500.0000 mg | ORAL_TABLET | Freq: Four times a day (QID) | ORAL | Status: DC | PRN
  Administered 2023-02-16: 500 mg via ORAL
  Filled 2023-02-15: qty 1

## 2023-02-15 MED ORDER — LIDOCAINE HCL (PF) 2 % IJ SOLN
INTRAMUSCULAR | Status: AC
  Filled 2023-02-15: qty 5

## 2023-02-15 MED ORDER — SUGAMMADEX SODIUM 200 MG/2ML IV SOLN
INTRAVENOUS | Status: DC | PRN
  Administered 2023-02-15: 200 mg via INTRAVENOUS

## 2023-02-15 MED ORDER — EPHEDRINE 5 MG/ML INJ
INTRAVENOUS | Status: AC
  Filled 2023-02-15: qty 5

## 2023-02-15 MED ORDER — BUPIVACAINE HCL (PF) 0.5 % IJ SOLN
INTRAMUSCULAR | Status: DC | PRN
  Administered 2023-02-15: 15 mL via PERINEURAL

## 2023-02-15 MED ORDER — ONDANSETRON HCL 4 MG/2ML IJ SOLN
INTRAMUSCULAR | Status: DC | PRN
  Administered 2023-02-15: 4 mg via INTRAVENOUS

## 2023-02-15 MED ORDER — ZOLPIDEM TARTRATE 5 MG PO TABS: 5.0000 mg | ORAL_TABLET | Freq: Every evening | ORAL | Status: DC | PRN

## 2023-02-15 MED ORDER — MIDAZOLAM HCL 2 MG/2ML IJ SOLN
INTRAMUSCULAR | Status: DC | PRN
  Administered 2023-02-15: 2 mg via INTRAVENOUS

## 2023-02-15 MED ORDER — ONDANSETRON HCL 4 MG/2ML IJ SOLN: 4.0000 mg | Freq: Four times a day (QID) | INTRAMUSCULAR | Status: DC | PRN

## 2023-02-15 MED ORDER — LEVOTHYROXINE SODIUM 125 MCG PO TABS
125.0000 ug | ORAL_TABLET | Freq: Every day | ORAL | Status: DC
  Administered 2023-02-16: 125 ug via ORAL
  Filled 2023-02-15: qty 1

## 2023-02-15 MED ORDER — FENTANYL CITRATE (PF) 100 MCG/2ML IJ SOLN
INTRAMUSCULAR | Status: AC
  Filled 2023-02-15: qty 2

## 2023-02-15 MED ORDER — ROCURONIUM BROMIDE 10 MG/ML (PF) SYRINGE
PREFILLED_SYRINGE | INTRAVENOUS | Status: DC | PRN
  Administered 2023-02-15: 50 mg via INTRAVENOUS

## 2023-02-15 MED ORDER — MAGNESIUM CITRATE PO SOLN: 1.0000 | Freq: Once | ORAL | Status: DC | PRN

## 2023-02-15 MED ORDER — BUSPIRONE HCL 5 MG PO TABS: 5.0000 mg | ORAL_TABLET | Freq: Two times a day (BID) | ORAL | Status: DC | PRN

## 2023-02-15 MED ORDER — DEXAMETHASONE SODIUM PHOSPHATE 10 MG/ML IJ SOLN
INTRAMUSCULAR | Status: AC
  Filled 2023-02-15: qty 1

## 2023-02-15 MED ORDER — BUPIVACAINE HCL (PF) 0.25 % IJ SOLN
INTRAMUSCULAR | Status: AC
  Filled 2023-02-15: qty 30

## 2023-02-15 MED ORDER — METHOCARBAMOL 500 MG IVPB - SIMPLE MED: 500.0000 mg | Freq: Four times a day (QID) | INTRAVENOUS | Status: DC | PRN

## 2023-02-15 MED ORDER — PHENYLEPHRINE 80 MCG/ML (10ML) SYRINGE FOR IV PUSH (FOR BLOOD PRESSURE SUPPORT)
PREFILLED_SYRINGE | INTRAVENOUS | Status: DC | PRN
  Administered 2023-02-15: 80 ug via INTRAVENOUS
  Administered 2023-02-15: 160 ug via INTRAVENOUS

## 2023-02-15 MED ORDER — ACETAMINOPHEN 500 MG PO TABS
1000.0000 mg | ORAL_TABLET | Freq: Once | ORAL | Status: AC
  Administered 2023-02-15: 1000 mg via ORAL
  Filled 2023-02-15: qty 2

## 2023-02-15 MED ORDER — PROPOFOL 500 MG/50ML IV EMUL
INTRAVENOUS | Status: AC
  Filled 2023-02-15: qty 50

## 2023-02-15 MED ORDER — LOSARTAN POTASSIUM 25 MG PO TABS
25.0000 mg | ORAL_TABLET | Freq: Every day | ORAL | Status: DC
  Administered 2023-02-16: 25 mg via ORAL
  Filled 2023-02-15 (×2): qty 1

## 2023-02-15 MED ORDER — EPINEPHRINE PF 1 MG/ML IJ SOLN
INTRAMUSCULAR | Status: AC
  Filled 2023-02-15: qty 1

## 2023-02-15 MED ORDER — GLYCOPYRROLATE 0.2 MG/ML IJ SOLN
INTRAMUSCULAR | Status: AC
  Filled 2023-02-15: qty 1

## 2023-02-15 MED ORDER — PROMETHAZINE HCL 25 MG/ML IJ SOLN: 6.2500 mg | INTRAMUSCULAR | Status: DC | PRN

## 2023-02-15 MED ORDER — PRONTOSAN WOUND IRRIGATION OPTIME
TOPICAL | Status: DC | PRN
  Administered 2023-02-15: 350 mL via TOPICAL

## 2023-02-15 MED ORDER — LIDOCAINE 2% (20 MG/ML) 5 ML SYRINGE
INTRAMUSCULAR | Status: DC | PRN
  Administered 2023-02-15 (×2): 40 mg via INTRAVENOUS

## 2023-02-15 MED ORDER — OXYCODONE HCL 5 MG PO TABS: 10.0000 mg | ORAL_TABLET | Freq: Four times a day (QID) | ORAL | Status: DC | PRN

## 2023-02-15 MED ORDER — MECLIZINE HCL 25 MG PO TABS: 25.0000 mg | ORAL_TABLET | Freq: Three times a day (TID) | ORAL | Status: DC | PRN

## 2023-02-15 MED ORDER — CHLORHEXIDINE GLUCONATE 0.12 % MT SOLN
15.0000 mL | Freq: Once | OROMUCOSAL | Status: AC
  Administered 2023-02-15: 15 mL via OROMUCOSAL

## 2023-02-15 MED ORDER — METOCLOPRAMIDE HCL 5 MG PO TABS: 5.0000 mg | ORAL_TABLET | Freq: Three times a day (TID) | ORAL | Status: DC | PRN

## 2023-02-15 MED ORDER — MENTHOL 3 MG MT LOZG: 1.0000 | LOZENGE | OROMUCOSAL | Status: DC | PRN

## 2023-02-15 MED ORDER — 0.9 % SODIUM CHLORIDE (POUR BTL) OPTIME
TOPICAL | Status: DC | PRN
  Administered 2023-02-15 (×3): 1000 mL

## 2023-02-15 MED ORDER — ONDANSETRON HCL 4 MG PO TABS: 4.0000 mg | ORAL_TABLET | Freq: Four times a day (QID) | ORAL | Status: DC | PRN

## 2023-02-15 MED ORDER — DOCUSATE SODIUM 100 MG PO CAPS
100.0000 mg | ORAL_CAPSULE | Freq: Two times a day (BID) | ORAL | Status: DC
  Administered 2023-02-15 – 2023-02-16 (×3): 100 mg via ORAL
  Filled 2023-02-15 (×3): qty 1

## 2023-02-15 MED ORDER — FENTANYL CITRATE PF 50 MCG/ML IJ SOSY: 25.0000 ug | PREFILLED_SYRINGE | INTRAMUSCULAR | Status: DC | PRN

## 2023-02-15 MED ORDER — ORAL CARE MOUTH RINSE: 15.0000 mL | Freq: Once | OROMUCOSAL | Status: AC

## 2023-02-15 MED ORDER — AMISULPRIDE (ANTIEMETIC) 5 MG/2ML IV SOLN: 10.0000 mg | Freq: Once | INTRAVENOUS | Status: DC | PRN

## 2023-02-15 SURGICAL SUPPLY — 99 items
AID PSTN UNV HD RSTRNT DISP (MISCELLANEOUS) ×1
APL PRP STRL LF DISP 70% ISPRP (MISCELLANEOUS) ×2
APL SKNCLS STERI-STRIP NONHPOA (GAUZE/BANDAGES/DRESSINGS) ×1
BAG COUNTER SPONGE SURGICOUNT (BAG) ×2 IMPLANT
BAG SPNG CNTER NS LX DISP (BAG) ×1
BASEPLATE GLENOID RSA 3X25 0D (Shoulder) IMPLANT
BENZOIN TINCTURE PRP APPL 2/3 (GAUZE/BANDAGES/DRESSINGS) ×2 IMPLANT
BIT DRILL 3.2 PERIPHERAL SCREW (BIT) IMPLANT
BLADE SAW SAG 29X58X.64 (BLADE) IMPLANT
BLADE SAW SGTL 73X25 THK (BLADE) ×2 IMPLANT
BLADE SAW SGTL 81X20 HD (BLADE) ×2 IMPLANT
BRUSH FEMORAL CANAL (MISCELLANEOUS) IMPLANT
BSPLAT GLND +3 25 (Shoulder) ×1 IMPLANT
CEMENT BONE DEPUY (Cement) IMPLANT
CHLORAPREP W/TINT 26 (MISCELLANEOUS) IMPLANT
CLSR STERI-STRIP ANTIMIC 1/2X4 (GAUZE/BANDAGES/DRESSINGS) ×2 IMPLANT
CNTNR URN SCR LID CUP LEK RST (MISCELLANEOUS) ×6 IMPLANT
CONT SPEC 4OZ STRL OR WHT (MISCELLANEOUS) ×3
COOLER ICEMAN CLASSIC (MISCELLANEOUS) IMPLANT
COVER BACK TABLE 60X90IN (DRAPES) IMPLANT
COVER SURGICAL LIGHT HANDLE (MISCELLANEOUS) ×2 IMPLANT
CUP HUM SYS INSERT SZ 1/2 36 (Joint) IMPLANT
DRAPE C-ARM 42X120 X-RAY (DRAPES) IMPLANT
DRAPE INCISE IOBAN 66X45 STRL (DRAPES) ×2 IMPLANT
DRAPE ORTHO SPLIT 77X108 STRL (DRAPES) ×2
DRAPE SHEET LG 3/4 BI-LAMINATE (DRAPES) IMPLANT
DRAPE SURG ORHT 6 SPLT 77X108 (DRAPES) ×4 IMPLANT
DRAPE TOP 10253 STERILE (DRAPES) ×2 IMPLANT
DRAPE U-SHAPE 47X51 STRL (DRAPES) ×2 IMPLANT
DRSG AQUACEL AG ADV 3.5X10 (GAUZE/BANDAGES/DRESSINGS) IMPLANT
DRSG MEPILEX POST OP 4X8 (GAUZE/BANDAGES/DRESSINGS) IMPLANT
DURAPREP 26ML APPLICATOR (WOUND CARE) ×4 IMPLANT
ELECT BLADE TIP CTD 4 INCH (ELECTRODE) ×2 IMPLANT
ELECT NDL TIP 2.8 STRL (NEEDLE) ×2 IMPLANT
ELECT NEEDLE TIP 2.8 STRL (NEEDLE) ×1 IMPLANT
ELECT REM PT RETURN 15FT ADLT (MISCELLANEOUS) ×2 IMPLANT
EVACUATOR 1/8 PVC DRAIN (DRAIN) IMPLANT
FACESHIELD WRAPAROUND (MASK) ×2 IMPLANT
FACESHIELD WRAPAROUND OR TEAM (MASK) ×4 IMPLANT
GAUZE PAD ABD 8X10 STRL (GAUZE/BANDAGES/DRESSINGS) ×2 IMPLANT
GAUZE SPONGE 4X4 12PLY STRL (GAUZE/BANDAGES/DRESSINGS) ×2 IMPLANT
GAUZE XEROFORM 1X8 LF (GAUZE/BANDAGES/DRESSINGS) IMPLANT
GLENOSPHERE REV SHOULDER 36 (Joint) IMPLANT
GLOVE BIO SURGEON STRL SZ 6.5 (GLOVE) ×2 IMPLANT
GLOVE BIOGEL PI IND STRL 6.5 (GLOVE) ×2 IMPLANT
GLOVE BIOGEL PI IND STRL 8 (GLOVE) ×2 IMPLANT
GLOVE ECLIPSE 8.0 STRL XLNG CF (GLOVE) ×2 IMPLANT
GOWN STRL REUS W/ TWL LRG LVL3 (GOWN DISPOSABLE) ×2 IMPLANT
GOWN STRL REUS W/ TWL XL LVL3 (GOWN DISPOSABLE) ×2 IMPLANT
GOWN STRL REUS W/TWL LRG LVL3 (GOWN DISPOSABLE) ×1
GOWN STRL REUS W/TWL XL LVL3 (GOWN DISPOSABLE) ×1
GUIDE PIN 3X75 SHOULDER (PIN) ×1
GUIDEWIRE GLENOID 2.5X220 (WIRE) IMPLANT
HANDPIECE INTERPULSE COAX TIP (DISPOSABLE) ×1
KIT BASIN OR (CUSTOM PROCEDURE TRAY) ×2 IMPLANT
KIT STABILIZATION SHOULDER (MISCELLANEOUS) ×2 IMPLANT
KIT TURNOVER KIT A (KITS) IMPLANT
MANIFOLD NEPTUNE II (INSTRUMENTS) ×2 IMPLANT
NDL 1/2 CIR CATGUT .05X1.09 (NEEDLE) ×2 IMPLANT
NEEDLE 1/2 CIR CATGUT .05X1.09 (NEEDLE) ×1 IMPLANT
NS IRRIG 1000ML POUR BTL (IV SOLUTION) ×2 IMPLANT
PACK SHOULDER (CUSTOM PROCEDURE TRAY) ×2 IMPLANT
PAD ARMBOARD 7.5X6 YLW CONV (MISCELLANEOUS) ×4 IMPLANT
PAD COLD SHLDR WRAP-ON (PAD) IMPLANT
PIN GUIDE 3X75 SHOULDER (PIN) IMPLANT
RESTRAINT HEAD UNIVERSAL NS (MISCELLANEOUS) ×2 IMPLANT
RETRIEVER SUT HEWSON (MISCELLANEOUS) IMPLANT
SCREW 5.5X22 (Screw) IMPLANT
SCREW 5.5X26 (Screw) IMPLANT
SCREW BONE THREAD 6.5X35 (Screw) IMPLANT
SET HNDPC FAN SPRY TIP SCT (DISPOSABLE) IMPLANT
SLING ARM FOAM STRAP MED (SOFTGOODS) IMPLANT
SLING ARM FOAM STRAP XLG (SOFTGOODS) IMPLANT
SLING ARM IMMOBILIZER LRG (SOFTGOODS) IMPLANT
SLING ARM IMMOBILIZER MED (SOFTGOODS) IMPLANT
SMARTMIX MINI TOWER (MISCELLANEOUS)
SOLUTION PRONTOSAN WOUND 350ML (IRRIGATION / IRRIGATOR) ×2 IMPLANT
SPONGE T-LAP 4X18 ~~LOC~~+RFID (SPONGE) IMPLANT
STEM HUMERAL SHORT STD SZ1 (Orthopedic Implant) IMPLANT
SUPPORT WRAP ARM LG (MISCELLANEOUS) ×2 IMPLANT
SUT ETHIBOND 2 OS 4 DA (SUTURE) ×2 IMPLANT
SUT ETHIBOND 2 V 37 (SUTURE) ×2 IMPLANT
SUT ETHILON 3 0 FSL (SUTURE) IMPLANT
SUT FIBERWIRE #2 38 T-5 BLUE (SUTURE)
SUT FIBERWIRE #5 38 CONV NDL (SUTURE) ×2
SUT MNCRL AB 4-0 PS2 18 (SUTURE) IMPLANT
SUT VIC AB 0 CT1 27 (SUTURE) ×1
SUT VIC AB 0 CT1 27XBRD ANBCTR (SUTURE) ×2 IMPLANT
SUT VIC AB 2-0 CT1 27 (SUTURE) ×1
SUT VIC AB 2-0 CT1 TAPERPNT 27 (SUTURE) ×2 IMPLANT
SUT VIC AB 3-0 SH 27 (SUTURE) ×1
SUT VIC AB 3-0 SH 27X BRD (SUTURE) ×2 IMPLANT
SUTURE FIBERWR #2 38 T-5 BLUE (SUTURE) IMPLANT
SUTURE FIBERWR #5 38 CONV NDL (SUTURE) IMPLANT
TOWEL OR 17X26 10 PK STRL BLUE (TOWEL DISPOSABLE) ×2 IMPLANT
TOWEL OR NON WOVEN STRL DISP B (DISPOSABLE) ×2 IMPLANT
TOWER SMARTMIX MINI (MISCELLANEOUS) IMPLANT
TUBE SUCTION HIGH CAP CLEAR NV (SUCTIONS) ×2 IMPLANT
WATER STERILE IRR 1000ML POUR (IV SOLUTION) ×2 IMPLANT

## 2023-02-15 NOTE — Anesthesia Postprocedure Evaluation (Signed)
Anesthesia Post Note  Patient: Melissa [name]  Procedure(s) Performed: TOTAL SHOULDER REVISION (Right: Shoulder)     Patient location during evaluation: PACU Anesthesia Type: Regional and General Level of consciousness: awake Pain management: pain level controlled Vital Signs Assessment: post-procedure vital signs reviewed and stable Respiratory status: spontaneous breathing, nonlabored ventilation and respiratory function stable Cardiovascular status: blood pressure returned to baseline and stable Postop Assessment: no apparent nausea or vomiting Anesthetic complications: no   No notable events documented.  Last Vitals:  Vitals:   02/15/23 1052 02/15/23 1409  BP: 130/80 114/63  Pulse: 94 89  Resp: 18 18  Temp: 36.7 C 36.9 C  SpO2: 96% 94%    Last Pain:  Vitals:   02/15/23 1409  TempSrc: Oral  PainSc:                  Melissa [name]

## 2023-02-15 NOTE — Op Note (Signed)
Orthopaedic Surgery Operative Note (CSN: [id number])  Melissa [name]  12-Mar-1966 Date of Surgery: 02/15/2023   Diagnoses:  Hardware failure right anatomic total arthroplasty  Procedure: Right reverse revision lateralized total Shoulder Arthroplasty Right open synovectomy of the shoulder and synovial biopsy   Operative Finding Successful completion of planned procedure.  Patient's subscapularis was partially torn inferiorly but the superior aspect appeared to be reasonable.  Tissue quality was reasonable and there was no obvious purulence.  Humeral side was well-fixed but we were able to remove the implants without issue.  Multiple anchors were removed.  Right side was quite interesting as the polyethylene had fractured loose and there was an inferior glenoid fracture at the site of the oblong implant cut into the glenoid.  I could imagine that the fracture of the inferior glenoid allowed micromotion and eventual fracture of the polyethylene at the junction with the center post allowing the implant to become dislodged.  This left the marker line buried in the glenoid which caused x-rays to appear as if the implant was in the appropriate position.  We were able to implant with essentially primary reverse shoulder arthroplasty implants.  Bone quality was moderate but the patient's anatomy was such that her bone was quite small and reduced the smallest implants on the humeral side to obtain appropriate fixation.  She is at reasonable the high risk of fracture.  Post-operative plan: The patient will be NWB in sling.  Will be on prophylactic amoxicillin for 2 weeks while awaiting cultures.  The patient will be  admitted because of insurance issues and medical monitoring.  If stable she can be discharged after evaluation.   DVT prophylaxis Aspirin 81 mg twice daily for 6 weeks.  Pain control with PRN pain medication preferring oral medicines.  Follow up plan will be scheduled in approximately 7 days  for incision check and XR.  Physical therapy to start after 4 weeks.  Implants: Tornier perform humeral size 1 stem, 0 retentive polyethylene, 36 standard glenosphere with a 25+3 baseplate and a 35 center screw with 2 peripheral locking screws  Post-Op Diagnosis: Same Surgeons:Primary: Bjorn Pippin, MD Assistants:Caroline McBane PA-C Location: [facility] ROOM 06 Anesthesia: General with Exparel Interscalene Antibiotics: Ancef 2g preop, Vancomycin  locally Tourniquet time: None Estimated Blood Loss: 200 Complications: None Specimens: 3 for culture, hold for 2 weeks for C acnes Implants: Implant Name Type Inv. Item Serial No. Manufacturer Lot No. LRB No. Used Action  BASEPLATE GLENOID RSA 3X25 0D - [id number] Shoulder BASEPLATE GLENOID RSA 3X25 0D [id number] [employer]  Right 1 Implanted  GLENOSPHERE REV SHOULDER 36 - [id number] Joint GLENOSPHERE REV SHOULDER 36 [id number] [employer]  Right 1 Implanted  SCREW 5.5X26 - [id number] Screw SCREW 5.5X26  [employer]  Right 1 Implanted  SCREW 5.5X22 - [id number] Screw SCREW 5.5X22  [employer]  Right 1 Implanted  SCREW BONE THREAD 6.5X35 - [id number] Screw SCREW BONE THREAD 6.5X35  [employer]  Right 1 Implanted  CUP HUM SYS INSERT SZ 1/2 36 - [id number] Joint CUP HUM SYS INSERT SZ 1/2 36 [id number] [employer]  Right 1 Implanted  STEM HUMERAL SHORT STD SZ1 - [id number] Orthopedic Implant STEM HUMERAL SHORT STD SZ1 [id number] [employer]  Right 1 Implanted    Indications for Surgery:   Melissa [name] is a 57 y.o. female with previous anatomic total arthroplasty with continued pain and dysfunction.  CT arthrogram demonstrated a complete dislodging of the polyethylene component.  Benefits and risks of operative  and nonoperative management were discussed prior to surgery with patient/guardian(s) and informed consent form was completed.  Infection and need for further surgery were discussed as was prosthetic stability and cuff issues.  We additionally  specifically discussed risks of axillary nerve injury, infection, periprosthetic fracture, continued pain and longevity of implants prior to beginning procedure.      Procedure:   The patient was identified in the preoperative holding area where the surgical site was marked. Block placed by anesthesia with exparel.  The patient was taken to the OR where a procedural timeout was called and the above noted anesthesia was induced.  The patient was positioned beachchair on allen table with spider arm positioner.  Preoperative antibiotics were dosed.  The patient's right shoulder was prepped and draped in the usual sterile fashion.  A second preoperative timeout was called.       We began by using the patient's previous deltopectoral approach.  Went through skin sharply achieving hemostasis to be progress.  We identified the deltopectoral interval which was scarred down.  We able to bluntly dissect and identify the plane between the pectoralis and the deltoid.  We placed self-retaining retractors.  At that point we stayed far lateral in the humerus and began our humeral releases.  We followed the bicipital groove and peeled the capsule and remnant of the subscapularis as a full layer.  We used careful to stay directly on bone and used careful external rotation of the arm as well as placement of retractors to avoid injury to the neurovascular structures nearby.  We identified that the inferior border of the subscapularis had pulled loose but the superior aspect was intact.  Multiple anchors were removed.  We able to carefully externally rotate the head and release the capsule from the inferior portion of the humerus.  The Arthrex humeral head and trunnion as well as center cage screw were all removed without issue.  We then irrigated and debrided take multiple synovial biopsies.  Complete synovectomy was performed of the joint.  Prontosan solution was placed as indicated in the instructions.  We irrigated with 3  L normal saline.  At that point we were able to prep for Tornier perform humeral stem using the center ball reamer and prepping with a 1 trial stem.  We then turned our attention to the glenoid side.  We were unable to perform an axillary nerve tug test secondary to subcoracoid scarring.  We instead release the superior aspect of the subscapularis remnant and bluntly dissected staying careful down on bone of the anterior glenoid.  The polyethylene was completely loose and was sitting in the inferior axillary recess.  We are able to remove by hand without issue.  Upon examination the superior peg had fractured off partially, the center large bag had fractured off completely in the inferior wedge-shaped portion of the PEG was still intact.  Comparing this to the glenoid the anterior-inferior portion of the glenoid had fractured and was clearly loose.  This would likely cause the implant become loose.  We performed careful dissection around the glenoid to ensure that we had appropriate visualization of the entirety of the glenoid.  We found that the center position of the previous PEG was slightly anterior and we felt that moving it slightly posterior to get [name] into better bone and avoid impingement of the new implants on the glenoid.  We placed our center pin and reamed to a flat glenoid surface.  We then drilled with her boss drill  trialed and selected a 25+3 baseplate with a 35 x 6.5 mm center screw.  We had good fixation with our screw and good compression of implant to bone.  2 peripheral locking screws were placed.  We then placed our 36 standard glenosphere and turned our attention back to the humerus.  A 0 polyethylene trial was selected.  We trialed and noted there was no impingement of the implants.  In this revision setting we felt that a retentive polyethylene was appropriate.  Took the arm through range of motion.  At that point we were able to remove all trials irrigated again and placed 2  FiberWire sutures for eventual repair of the subscapularis.  We placed our final implants and had great stability and no impingement.  Tension was moderate on the conjoined tendon.  We closed the remnant of the subscapularis before closing incision a multilayer fashion finishing with nylon suture.  We irrigated again prior to closure.  Local vancomycin powder was placed prior to closure.  We placed a dressing and a sling before taking the patient the PACU in stable condition.  Alfonse Alpers, PA-C, present and scrubbed throughout the case, critical for completion in a timely fashion, and for retraction, instrumentation, closure.

## 2023-02-15 NOTE — Transfer of Care (Signed)
Immediate Anesthesia Transfer of Care Note  Patient: Melissa [name]  Procedure(s) Performed: TOTAL SHOULDER REVISION (Right: Shoulder)  Patient Location: PACU  Anesthesia Type:GA combined with regional for post-op pain  Level of Consciousness: awake, alert , and oriented  Airway & Oxygen Therapy: Patient Spontanous Breathing and Patient connected to face mask oxygen  Post-op Assessment: Report given to RN and Post -op Vital signs reviewed and stable  Post vital signs: Reviewed and stable  Last Vitals:  Vitals Value Taken Time  BP 147/68 02/15/23 0922  Temp    Pulse 95 02/15/23 0924  Resp 15 02/15/23 0924  SpO2 100 % 02/15/23 0924  Vitals shown include unvalidated device data.  Last Pain:  Vitals:   02/15/23 0557  TempSrc: Oral         Complications: No notable events documented.

## 2023-02-15 NOTE — Anesthesia Procedure Notes (Signed)
Anesthesia Regional Block: Interscalene brachial plexus block   Pre-Anesthetic Checklist: , timeout performed,  Correct Patient, Correct Site, Correct Laterality,  Correct Procedure, Correct Position, site marked,  Risks and benefits discussed,  Surgical consent,  Pre-op evaluation,  At surgeon's request and post-op pain management  Laterality: Right  Prep: chloraprep       Needles:  Injection technique: Single-shot  Needle Type: Echogenic Stimulator Needle     Needle Length: 9cm  Needle Gauge: 21     Additional Needles:   Procedures:,,,, ultrasound used (permanent image in chart),,    Narrative:  Start time: 02/15/2023 7:10 AM End time: 02/15/2023 7:20 AM Injection made incrementally with aspirations every 5 mL.  Performed by: Personally  Anesthesiologist: Leonides Grills, MD  Additional Notes: Functioning IV was confirmed and monitors were applied.  A timeout was performed. Sterile prep, hand hygiene and sterile gloves were used. A 90mm 21ga Arrow echogenic stimulator needle was used. Negative aspiration and negative test dose prior to incremental administration of local anesthetic. The patient tolerated the procedure well.  Ultrasound guidance: relevent anatomy identified, needle position confirmed, local anesthetic spread visualized around nerve(s), vascular puncture avoided.  Image printed for medical record.

## 2023-02-15 NOTE — Discharge Instructions (Addendum)
Ramond Marrow MD, MPH Alfonse Alpers, PA-C [facility] [address], [address] [phone] (tel)   [phone] (fax)   POST-OPERATIVE INSTRUCTIONS - TOTAL SHOULDER REPLACEMENT    WOUND CARE You may leave the operative dressing in place until your follow-up appointment. KEEP THE INCISIONS CLEAN AND DRY. There may be a small amount of fluid/bleeding leaking at the surgical site. This is normal after surgery.  If it fills with liquid or blood please call us immediately to change it for you. Use the provided ice machine or Ice packs as often as possible for the first 3-4 days, then as needed for pain relief.   Keep a layer of cloth or a shirt between your skin and the cooling unit to prevent frost bite as it can get very cold.  SHOWERING: - You may shower on Post-Op Day #2.  - The dressing is water resistant but do not scrub it as it may start to peel up.   - You may remove the sling for showering - Gently pat the area dry.  - Do not soak the shoulder in water.  - Do not go swimming in the pool or ocean until your incision has completely healed (about 4-6 weeks after surgery) - KEEP THE INCISIONS CLEAN AND DRY.  EXERCISES Wear the sling at all times  You may remove the sling for showering, but keep the arm across the chest or in a secondary sling.    Accidental/Purposeful External Rotation and shoulder flexion (reaching behind you) is to be avoided at all costs for the first month. It is ok to come out of your sling if your are sitting and have assistance for eating.   Do not lift anything heavier than 1 pound until we discuss it further in clinic.  It is normal for your fingers/hand to become more swollen after surgery and discolored from bruising.   This will resolve over the first few weeks usually after surgery. Please continue to ambulate and do not stay sitting or lying for too long.  Perform foot and wrist pumps to assist in circulation.  PHYSICAL  THERAPY - No therapy for 4 weeks after surgery  REGIONAL ANESTHESIA (NERVE BLOCKS) The anesthesia team may have performed a nerve block for you this is a great tool used to minimize pain.   The block may start wearing off overnight (between 8-24 hours postop) When the block wears off, your pain may go from nearly zero to the pain you would have had postop without the block. This is an abrupt transition but nothing dangerous is happening.   This can be a challenging period but utilize your as needed pain medications to try and manage this period. We suggest you use the pain medication the first night prior to going to bed, to ease this transition.  You may take an extra dose of narcotic when this happens if needed   POST-OP MEDICATIONS- Multimodal approach to pain control In general your pain will be controlled with a combination of substances.  Prescriptions unless otherwise discussed are electronically sent to your pharmacy.  This is a carefully made plan we use to minimize narcotic use.     Celebrex - Anti-inflammatory medication taken on a scheduled basis Acetaminophen - Non-narcotic pain medicine taken on a scheduled basis  Gabapentin - this is for nerve based pain, take on a scheduled basis Oxycodone - This is a strong narcotic, to be used only on an "as needed" basis for SEVERE pain. Aspirin   - This medicine is used to minimize the risk of blood clots after surgery. Zofran -  take as needed for nausea Amoxicillin - this is an antibiotic, take as instructed   FOLLOW-UP If you develop a Fever (>101.5), Redness or Drainage from the surgical incision site, please call our office to arrange for an evaluation. Please call the office to schedule a follow-up appointment for a wound check, 7-10 days post-operatively.  IF YOU HAVE ANY QUESTIONS, PLEASE FEEL FREE TO CALL OUR OFFICE.  HELPFUL INFORMATION  Your arm will be in a sling following surgery. You will be in this sling for the  next 4 weeks.   You may be more comfortable sleeping in a semi-seated position the first few nights following surgery.  Keep a pillow propped under the elbow and forearm for comfort.  If you have a recliner type of chair it might be beneficial.  If not that is fine too, but it would be helpful to sleep propped up with pillows behind your operated shoulder as well under your elbow and forearm.  This will reduce pulling on the suture lines.  When dressing, put your operative arm in the sleeve first.  When getting undressed, take your operative arm out last.  Loose fitting, button-down shirts are recommended.  In most states it is against the law to drive while your arm is in a sling. And certainly against the law to drive while taking narcotics.  You may return to work/school in the next couple of days when you feel up to it. Desk work and typing in the sling is fine.  We suggest you use the pain medication the first night prior to going to bed, in order to ease any pain when the anesthesia wears off. You should avoid taking pain medications on an empty stomach as it will make you nauseous.  You should wean off your narcotic medicines as soon as you are able.     Most patients will be off narcotics before their first postop appointment.   Do not drink alcoholic beverages or take illicit drugs when taking pain medications.  Pain medication may make you constipated.  Below are a few solutions to try in this order: Decrease the amount of pain medication if you aren't having pain. Drink lots of decaffeinated fluids. Drink prune juice and/or each dried prunes  If the first 3 don't work start with additional solutions Take Colace - an over-the-counter stool softener Take Senokot - an over-the-counter laxative Take Miralax - a stronger over-the-counter laxative   Dental Antibiotics:  In most cases prophylactic antibiotics for Dental procdeures after total joint surgery are not  necessary.  Exceptions are as follows:  1. History of prior total joint infection  2. Severely immunocompromised (Organ Transplant, cancer chemotherapy, Rheumatoid biologic meds such as Humera)  3. Poorly controlled diabetes (A1C &gt; 8.0, blood glucose over 200)  If you have one of these conditions, contact your surgeon for an antibiotic prescription, prior to your dental procedure.   For more information including helpful videos and documents visit our website:   [email or web address]

## 2023-02-15 NOTE — Anesthesia Procedure Notes (Signed)
Procedure Name: Intubation Date/Time: 02/15/2023 7:36 AM  Performed by: Pearson Grippe, CRNAPre-anesthesia Checklist: Patient identified, Emergency Drugs available, Suction available and Patient being monitored Patient Re-evaluated:Patient Re-evaluated prior to induction Oxygen Delivery Method: Circle system utilized Preoxygenation: Pre-oxygenation with 100% oxygen Induction Type: IV induction Ventilation: Mask ventilation without difficulty Laryngoscope Size: Miller and 2 Grade View: Grade II Tube type: Oral Tube size: 7.0 mm Number of attempts: 1 Airway Equipment and Method: Stylet Placement Confirmation: ETT inserted through vocal cords under direct vision, positive ETCO2 and breath sounds checked- equal and bilateral Secured at: 22 cm Tube secured with: Tape Dental Injury: Teeth and Oropharynx as per pre-operative assessment

## 2023-02-16 MED ORDER — ACETAMINOPHEN 500 MG PO TABS: 1000.0000 mg | ORAL_TABLET | Freq: Three times a day (TID) | ORAL | 0 refills | Status: AC

## 2023-02-16 MED ORDER — ASPIRIN 81 MG PO CHEW: 81.0000 mg | CHEWABLE_TABLET | Freq: Two times a day (BID) | ORAL | 0 refills | Status: AC

## 2023-02-16 MED ORDER — AMOXICILLIN 500 MG PO TABS: 500.0000 mg | ORAL_TABLET | Freq: Two times a day (BID) | ORAL | 0 refills | Status: AC

## 2023-02-16 MED ORDER — OXYCODONE HCL 5 MG PO TABS: ORAL_TABLET | ORAL | 0 refills | Status: AC

## 2023-02-16 MED ORDER — CELECOXIB 100 MG PO CAPS: 100.0000 mg | ORAL_CAPSULE | Freq: Two times a day (BID) | ORAL | 0 refills | Status: AC

## 2023-02-16 MED ORDER — GABAPENTIN 100 MG PO CAPS: 100.0000 mg | ORAL_CAPSULE | Freq: Three times a day (TID) | ORAL | 0 refills | Status: DC

## 2023-02-16 MED ORDER — ONDANSETRON HCL 4 MG PO TABS: 4.0000 mg | ORAL_TABLET | Freq: Three times a day (TID) | ORAL | 0 refills | Status: AC | PRN

## 2023-02-16 NOTE — Discharge Summary (Signed)
Patient ID: Melissa [name] MRN: [id number] DOB/AGE: November 23, 1965 57 y.o.  Admit date: 02/15/2023 Discharge date: 02/16/2023  Admission Diagnoses:Hardware failure right anatomic total arthroplasty   Discharge Diagnoses:  Principal Problem:   Status post reverse total arthroplasty of right shoulder   Past Medical History:  Diagnosis Date   Allergic rhinitis    Anxiety    Arthritis    Back pain    Constipated    Depression    Diaphragmatic hernia    Fatty liver    GERD (gastroesophageal reflux disease)    History of kidney stones    Hypertension    Hypothyroidism    IBS (irritable bowel syndrome)    Joint pain    Kidney problem    Lactose intolerance    Morbid obesity    OSA (obstructive sleep apnea)    Osteoarthritis    Pre-diabetes    Seasonal allergies    Tinnitus    Vertigo    Vitamin D deficiency      Procedures Performed:  Right reverse revision lateralized total Shoulder Arthroplasty Right open synovectomy of the shoulder and synovial biopsy On 02/15/23 with Dr. Everardo Pacific  Discharged Condition: stable  Hospital Course: Patient brought in to [facility] for scheduled procedure.  Tolerated procedure well.  Was kept for monitoring overnight for pain control and medical monitoring postop. She was found to be stable for DC home the morning after surgery.  Patient was instructed on specific activity restrictions and all questions were answered.  Consults: OT  Significant Diagnostic Studies: No additional pertinent studies  Treatments: Surgery  Discharge Exam: General: sitting up in hospital bed, NAD Cardiac: regular rate Pulmonary: no increased work of breathing RUE: Dressing CDI and sling well fitting,  full and painless ROM throughout hand with DPC of 0.  Axillary nerve sensation/motor altered in setting of block and unable to be fully tested.  Distal motor and sensory altered in setting of block.    Disposition: Discharge disposition: 01-Home or  Self Care       Discharge Instructions     Call MD for:  redness, tenderness, or signs of infection (pain, swelling, redness, odor or green/yellow discharge around incision site)   Complete by: As directed    Call MD for:  severe uncontrolled pain   Complete by: As directed    Call MD for:  temperature >100.4   Complete by: As directed    Diet - low sodium heart healthy   Complete by: As directed       Allergies as of 02/16/2023       Reactions   Bee Venom Hives   Lactose Intolerance (gi)         Medication List     STOP taking these medications    amoxicillin-clavulanate 875-125 MG tablet Commonly known as: AUGMENTIN   aspirin EC 81 MG tablet Replaced by: aspirin 81 MG chewable tablet       TAKE these medications    acetaminophen 500 MG tablet Commonly known as: TYLENOL Take 2 tablets (1,000 mg total) by mouth every 8 (eight) hours for 14 days. What changed:  when to take this reasons to take this   amoxicillin 500 MG tablet Commonly known as: AMOXIL Take 1 tablet (500 mg total) by mouth 2 (two) times daily for 14 days.   aspirin 81 MG chewable tablet Commonly known as: Aspirin Childrens Chew 1 tablet (81 mg total) by mouth 2 (two) times daily. For 6 weeks for DVT  prophylaxis after surgery Replaces: aspirin EC 81 MG tablet   B-complex with vitamin C tablet Take 1 tablet by mouth at bedtime.   Breyna 80-4.5 MCG/ACT inhaler Generic drug: budesonide-formoterol Inhale 2 puffs into the lungs daily as needed.   buPROPion 300 MG 24 hr tablet Commonly known as: WELLBUTRIN XL Take 300 mg by mouth daily.   busPIRone 5 MG tablet Commonly known as: BUSPAR Take 5 mg by mouth 2 (two) times daily as needed for anxiety.   celecoxib 100 MG capsule Commonly known as: CeleBREX Take 1 capsule (100 mg total) by mouth 2 (two) times daily. For 2 weeks. Then take as needed   chlorpheniramine 4 MG tablet Commonly known as: CHLOR-TRIMETON Take 4 mg by mouth  daily.   cholecalciferol 25 MCG (1000 UNIT) tablet Commonly known as: VITAMIN D3 Take 1,000 Units by mouth daily.   [employer] EYE ALLERGY RELIEF OP Place 1 drop into both eyes daily as needed (allergies).   diphenhydrAMINE 25 mg capsule Commonly known as: BENADRYL Take 1 capsule (25 mg total) by mouth every 6 (six) hours as needed for itching, allergies or sleep.   EPINEPHrine 0.3 mg/0.3 mL Soaj injection Commonly known as: EPI-PEN Inject 0.3 mg into the muscle as needed for anaphylaxis.   FLUoxetine 40 MG capsule Commonly known as: PROZAC Take 40 mg by mouth daily.   gabapentin 100 MG capsule Commonly known as: NEURONTIN Take 1 capsule (100 mg total) by mouth 3 (three) times daily. For pain   levothyroxine 125 MCG tablet Commonly known as: SYNTHROID Take 125 mcg by mouth daily before breakfast.   losartan 25 MG tablet Commonly known as: COZAAR Take 25 mg by mouth daily.   meclizine 25 MG tablet Commonly known as: ANTIVERT Take 25 mg by mouth 3 (three) times daily as needed for dizziness.   metFORMIN 500 MG tablet Commonly known as: GLUCOPHAGE 1 tab at breakfast   montelukast 10 MG tablet Commonly known as: SINGULAIR Take 10 mg by mouth at bedtime.   mupirocin ointment 2 % Commonly known as: BACTROBAN Apply 1 Application topically daily as needed (irritation).   omeprazole 40 MG capsule Commonly known as: PRILOSEC Take 40 mg by mouth at bedtime.   ondansetron 4 MG tablet Commonly known as: Zofran Take 1 tablet (4 mg total) by mouth every 8 (eight) hours as needed for up to 7 days for nausea or vomiting.   oxyCODONE 5 MG immediate release tablet Commonly known as: Oxy IR/ROXICODONE Take 1-2 pills every 6 hrs as needed for severe pain, no more than 6 per day   pravastatin 80 MG tablet Commonly known as: PRAVACHOL Take 80 mg by mouth at bedtime.   triamcinolone cream 0.5 % Commonly known as: KENALOG Apply 1 Application topically daily as needed  (irritation).   vitamin C 1000 MG tablet Take 3,000 mg by mouth at bedtime.         Alfonse Alpers, PA-C 02/16/2023

## 2023-02-16 NOTE — Plan of Care (Signed)

## 2023-02-16 NOTE — TOC CM/SW Note (Signed)
  Transition of Care [facility]) Screening Note   Patient Details  Name: Melissa [name] Date of Birth: 30-Aug-1966   Transition of Care [facility]) CM/SW Contact:    Amada Jupiter, LCSW Phone Number: 02/16/2023, 11:00 AM    Transition of Care Department [facility]) has reviewed patient and no TOC needs have been identified at this time. We will continue to monitor patient advancement through interdisciplinary progression rounds. If new patient transition needs arise, please place a TOC consult.

## 2023-02-16 NOTE — Evaluation (Signed)
Occupational Therapy Evaluation Patient Details Name: Melissa [name] MRN: [id number] DOB: Apr 15, 1966 Today's Date: 02/16/2023   History of Present Illness Pt is a 57 y.o. female s/p R reverse revision lateralized total shoulder arthroplasty and right open synovectomy of the shoulder and synovial biopsy. PMH significant for arthritis, fatty liver, GERD, HTN, hypothyroidism, OSA, osteoarthritis, pre-diabetes, tinnitis, vertigo.   Clinical Impression   PTA, pt lived alone and was independent. Upon eval, pt requiring min A for UB ADL, however, discussed activity modification and compensatory techniques to progress to mod I. Pt has neighbor who can assist her with donning and doffing shirt/sling as well. All education provided and all recommended elbow/wrist/hand exercises reviewed. Recommend progress shoulder per physician recommendation.      Recommendations for follow up therapy are one component of a multi-disciplinary discharge planning process, led by the attending physician.  Recommendations may be updated based on patient status, additional functional criteria and insurance authorization.   Assistance Recommended at Discharge PRN  Patient can return home with the following Assistance with cooking/housework;Assist for transportation;A little help with bathing/dressing/bathroom    Functional Status Assessment  Patient has had a recent decline in their functional status and demonstrates the ability to make significant improvements in function in a reasonable and predictable amount of time.  Equipment Recommendations  None recommended by OT    Recommendations for Other Services       Precautions / Restrictions Precautions Precautions: Shoulder Type of Shoulder Precautions: NO ROM at shoulder for exercise or ADL. Pt may perform AROM elbow/wrist/hand. Sling at all times. NWB Shoulder Interventions: Shoulder sling/immobilizer Precaution Booklet Issued: Yes (comment) Precaution Comments:  All precautions reviewed within the context of ADL Restrictions Weight Bearing Restrictions: Yes RUE Weight Bearing: Non weight bearing      Mobility Bed Mobility Overal bed mobility: Modified Independent             General bed mobility comments: use of compensatory techniques    Transfers Overall transfer level: Modified independent Equipment used: None               General transfer comment: incresed time      Balance Overall balance assessment: Modified Independent                                         ADL either performed or assessed with clinical judgement   ADL Overall ADL's : Needs assistance/impaired Eating/Feeding: Set up   Grooming: Standing;Modified independent   Upper Body Bathing: Supervision/ safety;Sitting;Standing   Lower Body Bathing: Modified independent;Sit to/from stand   Upper Body Dressing : Minimal assistance;Sitting Upper Body Dressing Details (indicate cue type and reason): for brace application. Have discussed additional compensatory technniques to utilize in the home setting Lower Body Dressing: Modified independent;Sit to/from stand   Toilet Transfer: Modified Stage manager and Hygiene: Modified independent   Tub/ Engineer, structural: Psychologist, counselling;Modified independent;Ambulation   Functional mobility during ADLs: Modified independent       Vision Ability to See in Adequate Light: 0 Adequate Patient Visual Report: No change from baseline Vision Assessment?: No apparent visual deficits     Perception     Praxis      Pertinent Vitals/Pain Pain Assessment Pain Assessment: 0-10 Pain Score: 5  Pain Location: R shoulder Pain Descriptors / Indicators: Aching Pain Intervention(s): Limited activity within patient's tolerance, Monitored during session  Hand Dominance Right   Extremity/Trunk Assessment Upper Extremity Assessment Upper Extremity Assessment: RUE  deficits/detail RUE Deficits / Details: Pt able to move at hand/wrist/elbow [facility].   Lower Extremity Assessment Lower Extremity Assessment: Overall WFL for tasks assessed   Cervical / Trunk Assessment Cervical / Trunk Assessment: Back Surgery   Communication Communication Communication: No difficulties   Cognition Arousal/Alertness: Awake/alert Behavior During Therapy: WFL for tasks assessed/performed Overall Cognitive Status: Within Functional Limits for tasks assessed                                       General Comments  VSS    Exercises Exercises: Shoulder Shoulder Exercises Elbow Flexion: AROM, Right, 10 reps Elbow Extension: AROM, Right, 10 reps Wrist Flexion: AROM, Right, 10 reps Wrist Extension: AROM, Right, 10 reps Digit Composite Flexion: AROM, Right, 10 reps Composite Extension: AROM, Right, 10 reps Neck Flexion: AROM, 5 reps Neck Extension: AROM, 5 reps Neck Lateral Flexion - Right: AROM, 5 reps Neck Lateral Flexion - Left: AROM, 5 reps   Shoulder Instructions Shoulder Instructions Donning/doffing shirt without moving shoulder: Patient able to independently direct caregiver Method for sponge bathing under operated UE: Modified independent Donning/doffing sling/immobilizer: Patient able to independently direct caregiver Correct positioning of sling/immobilizer: Modified independent ROM for elbow, wrist and digits of operated UE: Independent Sling wearing schedule (on at all times/off for ADL's): Independent Proper positioning of operated UE when showering: Modified independent Positioning of UE while sleeping: Independent    Home Living Family/patient expects to be discharged to:: Private residence Living Arrangements: Alone Available Help at Discharge: Family;Friend(s);Available PRN/intermittently Type of Home: House Home Access: Stairs to enter [employer] of Steps: 4 Entrance Stairs-Rails: Right;Left Home Layout: One level      Bathroom Shower/Tub: Producer, television/film/video: Handicapped height Bathroom Accessibility: Yes   Home Equipment: Cane - single point (walking stick)          Prior Functioning/Environment Prior Level of Function : Independent/Modified Independent             Mobility Comments: intermittent cane use based on terrain ADLs Comments: Pt reports independence with ADL, IADL, and driving        OT Problem List: Decreased strength;Decreased range of motion;Decreased activity tolerance;Decreased knowledge of use of DME or AE;Decreased knowledge of precautions;Impaired UE functional use      OT Treatment/Interventions:      OT Goals(Current goals can be found in the care plan section) Acute Rehab OT Goals Patient Stated Goal: go home OT Goal Formulation: With patient  OT Frequency:      Co-evaluation              AM-PAC OT "6 Clicks" Daily Activity     Outcome Measure Help from another person eating meals?: None Help from another person taking care of personal grooming?: None Help from another person toileting, which includes using toliet, bedpan, or urinal?: None Help from another person bathing (including washing, rinsing, drying)?: A Little Help from another person to put on and taking off regular upper body clothing?: A Little Help from another person to put on and taking off regular lower body clothing?: None 6 Click Score: 22   End of Session Equipment Utilized During Treatment: Other (comment) (shoulder immobilizer R) Nurse Communication: Mobility status  Activity Tolerance: Patient tolerated treatment well Patient left: in bed;with call bell/phone within reach  OT Visit  Diagnosis: Muscle weakness (generalized) (M62.81)                Time: 4098-1191 OT Time Calculation (min): 56 min Charges:  OT General Charges $OT Visit: 1 Visit OT Evaluation $OT Eval Low Complexity: 1 Low OT Treatments $Self Care/Home Management : 38-52 mins  Tyler Deis, OTR/L [facility] Acute Rehabilitation Office: [phone]   Melissa [name] 02/16/2023, 12:00 PM

## 2023-02-16 NOTE — Progress Notes (Signed)
   ORTHOPAEDIC PROGRESS NOTE  s/p Procedure(s): TOTAL SHOULDER REVISION  SUBJECTIVE: Reports mild pain about operative site. Nerve block starting to wear off.   No chest pain. No SOB. No nausea/vomiting. No other complaints.  OBJECTIVE: PE: General: sitting up in hospital bed, NAD Cardiac: regular rate Pulmonary: no increased work of breathing RUE: Dressing CDI and sling well fitting,  full and painless ROM throughout hand with DPC of 0.  Axillary nerve sensation/motor altered in setting of block and unable to be fully tested.  Distal motor and sensory altered in setting of block.     Vitals:   02/15/23 2203 02/16/23 0609  BP: (!) 120/52 134/71  Pulse: 85 77  Resp: 15 16  Temp: 98 F (36.7 C) 98.1 F (36.7 C)  SpO2: 97% 100%   Stable post-op images  ASSESSMENT: Melissa [name] is a 57 y.o. female doing well postoperatively. POD#1  PLAN: Weightbearing: NWB RUE Insicional and dressing care: Reinforce dressings as needed Orthopedic device(s):  Sling Showering: post-op day #2 with assistance VTE prophylaxis: Aspirin 81 BID Pain control: PRN pain medications, minimize narcotics as able Follow - up plan: 2 weeks in office Dispo: OT eval today. Then discharge home. Patient is in agreement with the treatment plan. All of her questions are answered.  Contact information:  Weekdays 8-5 Dr. Ramond Marrow, Alfonse Alpers PA-C , After hours and holidays please check [email or web address] for group call information for [facility]   Alfonse Alpers, PA-C 02/16/2023

## 2023-02-17 LAB — FUNGUS CULTURE RESULT

## 2023-02-17 LAB — FUNGUS CULTURE WITH STAIN

## 2023-02-20 ENCOUNTER — Encounter (HOSPITAL_COMMUNITY): Payer: Self-pay | Admitting: Orthopaedic Surgery

## 2023-02-20 LAB — AEROBIC/ANAEROBIC CULTURE W GRAM STAIN (SURGICAL/DEEP WOUND): Gram Stain: NONE SEEN

## 2023-02-22 LAB — AEROBIC/ANAEROBIC CULTURE W GRAM STAIN (SURGICAL/DEEP WOUND)

## 2023-02-24 LAB — AEROBIC/ANAEROBIC CULTURE W GRAM STAIN (SURGICAL/DEEP WOUND)

## 2023-02-25 LAB — AEROBIC/ANAEROBIC CULTURE W GRAM STAIN (SURGICAL/DEEP WOUND): Gram Stain: NONE SEEN

## 2023-02-28 LAB — AEROBIC/ANAEROBIC CULTURE W GRAM STAIN (SURGICAL/DEEP WOUND): Culture: NO GROWTH

## 2023-03-02 DIAGNOSIS — M19011 Primary osteoarthritis, right shoulder: Secondary | ICD-10-CM | POA: Diagnosis not present

## 2023-03-03 LAB — AEROBIC/ANAEROBIC CULTURE W GRAM STAIN (SURGICAL/DEEP WOUND)

## 2023-03-06 LAB — AEROBIC/ANAEROBIC CULTURE W GRAM STAIN (SURGICAL/DEEP WOUND): Culture: NO GROWTH

## 2023-03-07 LAB — AEROBIC/ANAEROBIC CULTURE W GRAM STAIN (SURGICAL/DEEP WOUND): Culture: NO GROWTH

## 2023-03-08 LAB — AEROBIC/ANAEROBIC CULTURE W GRAM STAIN (SURGICAL/DEEP WOUND)

## 2023-03-13 ENCOUNTER — Telehealth: Payer: Self-pay | Admitting: Neurology

## 2023-03-13 ENCOUNTER — Ambulatory Visit: Payer: Medicare HMO | Admitting: Neurology

## 2023-03-13 ENCOUNTER — Encounter: Payer: Self-pay | Admitting: Neurology

## 2023-03-13 VITALS — BP 130/76 | HR 77 | Ht 62.0 in | Wt 236.0 lb

## 2023-03-13 DIAGNOSIS — R29898 Other symptoms and signs involving the musculoskeletal system: Secondary | ICD-10-CM

## 2023-03-13 DIAGNOSIS — H9313 Tinnitus, bilateral: Secondary | ICD-10-CM

## 2023-03-13 DIAGNOSIS — R413 Other amnesia: Secondary | ICD-10-CM

## 2023-03-13 DIAGNOSIS — R202 Paresthesia of skin: Secondary | ICD-10-CM

## 2023-03-13 DIAGNOSIS — F1011 Alcohol abuse, in remission: Secondary | ICD-10-CM | POA: Diagnosis not present

## 2023-03-13 DIAGNOSIS — Z823 Family history of stroke: Secondary | ICD-10-CM | POA: Diagnosis not present

## 2023-03-13 DIAGNOSIS — F0390 Unspecified dementia without behavioral disturbance: Secondary | ICD-10-CM | POA: Diagnosis not present

## 2023-03-13 DIAGNOSIS — E079 Disorder of thyroid, unspecified: Secondary | ICD-10-CM

## 2023-03-13 DIAGNOSIS — I679 Cerebrovascular disease, unspecified: Secondary | ICD-10-CM

## 2023-03-13 NOTE — Telephone Encounter (Signed)
[employer] medicare sent to [facility] they obtain auth [id number] 

## 2023-03-13 NOTE — Patient Instructions (Addendum)
- EEG - look for seizures - MRI brain w/wo contrast - look for strokes or other etiologies of memory loss, confusion - Formal memory testing - esp because of 20+ year history of heavy drinking (no smoking or drugs) Dr. Clayborn Heron - she lost weight and had another sleep test which was negative but has gained back weight, follow up with sleep provider because sleep apnea can cause strokes, heart disease, memory loss  Orders Placed This Encounter  Procedures   MR BRAIN W WO CONTRAST   Vitamin B1   RPR   HIV Antibody (routine testing w rflx)   Homocysteine   Ammonia   Ammonia   Ambulatory referral to Neuropsychology   EEG adult      Electroencephalogram, Adult An electroencephalogram (EEG) is a test that records electrical activity in the brain. It is often used to diagnose or monitor problems that are related to the brain, such as: Seizure disorders. Fainting spells. Sleep problems. Head injuries. Changes in behavior. Tell a health care provider about: Any allergies you have. All medicines you are taking, including vitamins, herbs, eye drops, creams, and over-the-counter medicines. Any medical conditions you have or have had, including psychiatric conditions. Any surgeries you have had. Any history of heavy drug or alcohol use. What are the risks? Generally, this is a safe test. If you have a seizure disorder, you may be made to have a seizure during the test. This is done so that your brain activity can be recorded during the seizure. What happens before the procedure?  Arrive with your hair clean, dry, and not styled. Do not comb your hair toward your scalp to add volume (backcomb). Do not put hair spray, oil, or other hair products in your hair. Do not have caffeine within 4 hours of having your test. Follow instructions from your health care provider about how much sleep to get before the test. If you are asked to limit how much you sleep, you may need to have a  responsible adult bring you to the test and take you home from the test. Ask your health care provider about taking your regular and over-the-counter medicines, herbs, and supplements. What happens during the procedure? You will be asked to sit in a chair or lie down. Many small metal discs (electrodes) will be carefully attached to your head with an adhesive. It may take some time to get all the electrodes on the right spots for the test. These electrodes will pick up on the signals in your brain, and a machine will record the signals. During the test, you may be asked to: Sit or lie quietly and relax. Open and close your eyes. Breathe deeply and quickly (hyperventilate) for 3 minutes or longer. Look at a flashing light for a short amount of time. Read text or look at an image. Go to sleep. When the test is complete, the electrodes will be removed by using a solution such as acetone or fingernail polish remover. What can I expect after the test? After your test, it is common to have no after-effects from the test. You should be able to return to your normal daily activities right away. Keep in mind: You may need to bathe after the test to remove the adhesive used to attach the electrodes. The adhesive is easily washed away. If a seizure happened during your EEG, you may need further medical help depending on the type of seizure and how severe it was. It is up to you to  get the results of your procedure. Ask your health care provider, or the department that is doing the procedure, when your results will be ready. Summary An electroencephalogram (EEG) is a test that is often used to diagnose or monitor problems that are related to the brain. Do not have caffeine within 4 hours of having your test. Follow other instructions from your health care provider about sleep and medicines before the test. During the procedure, small metal discs (electrodes) will be attached to your head with an  adhesive. You may be asked to sit or lie quietly and relax during the test. You may also be asked to do other activities during the test, such as watch flashing lights or breathe quickly. This information is not intended to replace advice given to you by your health care provider. Make sure you discuss any questions you have with your health care provider. Document Revised: 06/23/2021 Document Reviewed: 03/26/2020 Elsevier Patient Education  2023 [employer]   Memory Compensation Strategies  Use "WARM" strategy.  W= write it down  A= associate it  R= repeat it  M= make a mental note  2.   You can keep a Glass blower/designer.  Use a 3-ring notebook with sections for the following: calendar, important names and phone numbers,  medications, doctors' names/phone numbers, lists/reminders, and a section to journal what you did  each day.   3.    Use a calendar to write appointments down.  4.    Write yourself a schedule for the day.  This can be placed on the calendar or in a separate section of the Memory Notebook.  Keeping a  regular schedule can help memory.  5.    Use medication organizer with sections for each day or morning/evening pills.  You may need help loading it  6.    Keep a basket, or pegboard by the door.  Place items that you need to take out with you in the basket or on the pegboard.  You may also want to  include a message board for reminders.  7.    Use sticky notes.  Place sticky notes with reminders in a place where the task is performed.  For example: " turn off the  stove" placed by the stove, "lock the door" placed on the door at eye level, " take your medications" on  the bathroom mirror or by the place where you normally take your medications.  8.    Use alarms/timers.  Use while cooking to remind yourself to check on food or as a reminder to take your medicine, or as a  reminder to make a call, or as a reminder to perform another task, etc.

## 2023-03-13 NOTE — Telephone Encounter (Signed)
Referral faxed to Dr. Peter Stewart Office: Phone: [phone]  Fax: [phone] 

## 2023-03-13 NOTE — Progress Notes (Signed)
[facility]    Provider:  Dr Lucia Gaskins Requesting Provider: Julien Girt, PA* Primary Care Provider:  Eartha Inch, MD  CC:  57 year old here for memory loss  HPI:  Melissa [name] is a 57 y.o. female here as requested by Julien Girt, PA* for memory loss. has S/P right UKR; Expected blood loss anemia; Obese; Acquired hypothyroidism; Osteoarthritis of right knee; Osteoarthritis of left knee; Atrophy of left kidney; Chronic back pain; Diaphragmatic hernia; DDD (degenerative disc disease), cervical; History of systemic reaction to bee sting; Hydronephrosis of right kidney; Major depression, recurrent, chronic (HCC); Neck pain; Obstructive sleep apnea syndrome, mild; Spondylolisthesis; Vitamin D deficiency; Constipated; Hypothyroidism; Prediabetes; Lumbar radiculopathy; Other hyperlipidemia; Major depressive disorder, recurrent episode, mild with anxious distress (HCC); Hyperkalemia; Insulin resistance; SOB (shortness of breath); Lumbar spine pain; S/P lumbar fusion; DOE (dyspnea on exertion); Allergic rhinitis; Class 3 severe obesity with serious comorbidity and body mass index (BMI) of 45.0 to 49.9 in adult [facility]); Depression; Bilateral chronic knee pain; and Status post reverse total arthroplasty of right shoulder on their problem list.  Patient is here for memory loss.  I could not find any brain imaging in epic or "Care Everywhere".  From a thorough review of records it appears as though she just had status post reverse total arthroplasty of the right shoulder with Dr. Everardo Pacific April 24 to February 16, 2023.  She had memory loss issues for 20 years and that started from a thyroid issues. Forgets things she has to do, not good with names, relatively sttable. Lives alone, not getting lost, no accidents in the home, performing all IADLs and ADLs no problems there. She loses time. There is confusion sometimes doing things. For 20 years has not changed. Chronic pain. Disabled.  Joint pain. Also major recurrent depression, anxiety. She has weakness in the legs and arms, comes and goes. Also episodes of facial numbnes and tongling.No other focal neurologic deficits, associated symptoms, inciting events or modifiable factors. Quit drinking 17 years she drank very heavily 4-6 beers a day for 20 years.    Reviewed notes, labs and imaging from outside physicians, which showed :  01/04/2023 B12 1,016, hgba1c 5.8,vit d nml. TSH 09/2022 2.280.      Latest Ref Rng & Units 02/03/2023    1:39 PM 05/04/2022    9:25 AM 06/19/2020    8:29 AM  CBC  WBC 4.0 - 10.5 K/uL 8.2  5.9  8.1   Hemoglobin 12.0 - 15.0 g/dL 16.1  09.6  04.5   Hematocrit 36.0 - 46.0 % 41.0  39.6  42.4   Platelets 150 - 400 K/uL 354  356  374       Latest Ref Rng & Units 02/03/2023    1:39 PM 01/04/2023    8:54 AM 05/04/2022    9:25 AM  CMP  Glucose 70 - 99 mg/dL 409  811  914   BUN 6 - 20 mg/dL 18  18  18    Creatinine 0.44 - 1.00 mg/dL 7.82  9.56  2.13   Sodium 135 - 145 mmol/L 138  143  140   Potassium 3.5 - 5.1 mmol/L 4.5  5.1  4.4   Chloride 98 - 111 mmol/L 103  104  103   CO2 22 - 32 mmol/L 26  23  27    Calcium 8.9 - 10.3 mg/dL 9.5  9.6  9.6   Total Protein 6.5 - 8.1 g/dL 7.5  7.0    Total Bilirubin 0.3 -  1.2 mg/dL 0.6  0.3    Alkaline Phos 38 - 126 U/L 88  125    AST 15 - 41 U/L 17  22    ALT 0 - 44 U/L 21  27       Review of Systems: Patient complains of symptoms per HPI as well as the following symptoms worried about hx of stroke, memory loss, depression, anxiety, allergies, jpint pain, ringing in ears. Pertinent negatives and positives per HPI. All others negative.   Social History   Socioeconomic History   Marital status: Single    Spouse name: Not on file   Number of children: Not on file   Years of education: Not on file   Highest education level: Not on file  Occupational History   Occupation: [employer]  Tobacco Use   Smoking status: Never   Smokeless tobacco: Never  Vaping Use    Vaping Use: Never used  Substance and Sexual Activity   Alcohol use: No    Comment: quit 17 yrs ago   Drug use: Never   Sexual activity: Not on file  Other Topics Concern   Not on file  Social History Narrative   Lives home alone, (has dogs and cats).  Disabled, due to back.  Education 2 college degree's. 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   Coffee/ sodas 2-3 daily   Social Determinants of Health   Financial Resource Strain: Not on file  Food Insecurity: No Food Insecurity (02/15/2023)   Hunger Vital Sign    Worried About Running Out of Food in the Last Year: Never true    Ran Out of Food in the Last Year: Never true  Transportation Needs: No Transportation Needs (02/15/2023)   PRAPARE - Administrator, Civil Service (Medical): No    Lack of Transportation (Non-Medical): No  Physical Activity: Not on file  Stress: Not on file  Social Connections: Not on file  Intimate Partner Violence: Not At Risk (02/15/2023)   Humiliation, Afraid, Rape, and Kick questionnaire    Fear of Current or Ex-Partner: No    Emotionally Abused: No    Physically Abused: No    Sexually Abused: No    Family History  Problem Relation Age of Onset   Arthritis Mother        spianl stenosis   Atrial fibrillation Mother    Transient ischemic attack Mother    High Cholesterol Mother    Diabetes Father    Heart attack Father        3 stents   Heart disease Father    Atrial fibrillation Sister    Depression Sister    Transient ischemic attack Maternal Aunt    Stroke Maternal Grandmother    Diabetes Paternal Grandmother     Past Medical History:  Diagnosis Date   Allergic rhinitis    Anxiety    Arthritis    Back pain    Constipated    Depression    Diaphragmatic hernia    Fatty liver    GERD (gastroesophageal reflux disease)    History of kidney stones    Hypertension    Hypothyroidism    IBS (irritable bowel syndrome)    Joint pain    Kidney problem    one functioning  kidney   Lactose intolerance    Morbid obesity (HCC)    OSA (obstructive sleep apnea)    Osteoarthritis    Pre-diabetes    Seasonal allergies    Tinnitus  Vertigo    Vitamin D deficiency     Patient Active Problem List   Diagnosis Date Noted   Status post reverse total arthroplasty of right shoulder 02/15/2023   Bilateral chronic knee pain 11/09/2022   Depression 09/19/2022   DOE (dyspnea on exertion) 08/18/2022   Allergic rhinitis 08/18/2022   Class 3 severe obesity with serious comorbidity and body mass index (BMI) of 45.0 to 49.9 in adult (HCC) 08/18/2022   S/P lumbar fusion 05/09/2022   Lumbar spine pain 03/08/2022   Insulin resistance 01/11/2022   SOB (shortness of breath) 01/11/2022   Hyperkalemia 08/11/2021   Major depressive disorder, recurrent episode, mild with anxious distress (HCC) 06/21/2021   Lumbar radiculopathy 06/01/2021   Other hyperlipidemia 06/01/2021   Hypothyroidism 11/30/2020   Prediabetes 11/30/2020   Constipated    DDD (degenerative disc disease), cervical 08/09/2018   Neck pain 07/21/2018   Chronic back pain 04/03/2018   Spondylolisthesis 04/03/2018   Osteoarthritis of right knee 02/23/2018   Osteoarthritis of left knee 02/05/2018   Acquired hypothyroidism 12/13/2016   History of systemic reaction to bee sting 12/13/2016   Atrophy of left kidney 09/10/2015   Hydronephrosis of right kidney 09/10/2015   Obstructive sleep apnea syndrome, mild 08/05/2014   Expected blood loss anemia 11/06/2012   Obese 11/06/2012   S/P right UKR 11/05/2012   Major depression, recurrent, chronic (HCC) 02/08/2011   Vitamin D deficiency 04/30/2010   Diaphragmatic hernia 09/11/2007    Past Surgical History:  Procedure Laterality Date   ABDOMINAL EXPOSURE N/A 05/09/2022   Procedure: ABDOMINAL EXPOSURE;  Surgeon: Cephus Shelling, MD;  Location: [facility] OR;  Service: Vascular;  Laterality: N/A;   CARPAL TUNNEL RELEASE     rt/lt   COLONOSCOPY     FINGER  ARTHRODESIS  11/08/2011   Procedure: ARTHRODESIS FINGER;  Surgeon: Wyn Forster., MD;  Location: [facility];  Service: Orthopedics;  Laterality: Left;  left thumb carpal-metacarpal fusion   KNEE ARTHROSCOPY     both knees   OBLIQUE LUMBAR INTERBODY FUSION 1 LEVEL WITH PERCUTANEOUS SCREWS N/A 05/09/2022   Procedure: OBLIQUE LUMBAR INTERBODY FUSION 1 LEVEL WITH PERCUTANEOUS SCREWS (OLIF L4-5 WITH POSTERIOR SPINAL FUSION INTERBODY);  Surgeon: Venita Lick, MD;  Location: [facility] OR;  Service: Orthopedics;  Laterality: N/A;  4 hrs Dr. Chestine Spore to do approach Left tap block with exparel   PARTIAL KNEE ARTHROPLASTY  11/05/2012   Procedure: UNICOMPARTMENTAL KNEE;  Surgeon: [name], MD;  Location: [facility] ORS;  Service: Orthopedics;  Laterality: Right;   TONSILLECTOMY     TOTAL SHOULDER ARTHROPLASTY Right 06/25/2020   Procedure: TOTAL SHOULDER ARTHROPLASTY;  Surgeon: Yolonda Kida, MD;  Location: [facility] ORS;  Service: Orthopedics;  Laterality: Right;  2.5 hrs   TOTAL SHOULDER REVISION Right 02/15/2023   Procedure: TOTAL SHOULDER REVISION;  Surgeon: Bjorn Pippin, MD;  Location: [facility] ORS;  Service: Orthopedics;  Laterality: Right;   URETER SURGERY     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     aspirin (ASPIRIN CHILDRENS) 81 MG chewable tablet Chew 1 tablet (81 mg total) by mouth 2 (two) times daily. For 6 weeks for DVT prophylaxis after surgery 84 tablet 0   B Complex-C (B-COMPLEX WITH VITAMIN C) tablet Take 1 tablet by  mouth at bedtime.      BREYNA 80-4.5 MCG/ACT inhaler Inhale 2 puffs into the lungs daily as needed.     buPROPion (WELLBUTRIN XL) 300 MG 24 hr tablet Take 300 mg by mouth daily.     busPIRone (BUSPAR) 5 MG tablet Take 5 mg by mouth 2 (two)  times daily as needed for anxiety.     celecoxib (CELEBREX) 100 MG capsule Take 1 capsule (100 mg total) by mouth 2 (two) times daily. For 2 weeks. Then take as needed 60 capsule 0   chlorpheniramine (CHLOR-TRIMETON) 4 MG tablet Take 4 mg by mouth daily.     cholecalciferol (VITAMIN D3) 25 MCG (1000 UNIT) tablet Take 1,000 Units by mouth daily.     diphenhydrAMINE (BENADRYL) 25 mg capsule Take 1 capsule (25 mg total) by mouth every 6 (six) hours as needed for itching, allergies or sleep. 30 capsule    EPINEPHrine 0.3 mg/0.3 mL IJ SOAJ injection Inject 0.3 mg into the muscle as needed for anaphylaxis.     FLUoxetine (PROZAC) 40 MG capsule Take 40 mg by mouth daily.     gabapentin (NEURONTIN) 100 MG capsule Take 1 capsule (100 mg total) by mouth 3 (three) times daily. For pain 90 capsule 0   levothyroxine (SYNTHROID) 125 MCG tablet Take 125 mcg by mouth daily before breakfast.     losartan (COZAAR) 25 MG tablet Take 25 mg by mouth daily.     meclizine (ANTIVERT) 25 MG tablet Take 25 mg by mouth 3 (three) times daily as needed for dizziness.     metFORMIN (GLUCOPHAGE) 500 MG tablet 1 tab at breakfast 180 tablet 0   montelukast (SINGULAIR) 10 MG tablet Take 10 mg by mouth at bedtime.     mupirocin ointment (BACTROBAN) 2 % Apply 1 Application topically daily as needed (irritation).     Naphazoline-Pheniramine ([employer] EYE ALLERGY RELIEF OP) Place 1 drop into both eyes daily as needed (allergies).     omeprazole (PRILOSEC) 40 MG capsule Take 40 mg by mouth at bedtime.      pravastatin (PRAVACHOL) 80 MG tablet Take 80 mg by mouth at bedtime.     triamcinolone cream (KENALOG) 0.5 % Apply 1 Application topically daily as needed (irritation).     No current facility-administered medications for this visit.    Allergies as of 03/13/2023 - Review Complete 03/13/2023  Allergen Reaction Noted   Bee venom Hives 04/11/2015   Lactose intolerance (gi)  05/09/2022    Vitals: BP 130/76   Pulse 77   Ht 5\' 2"   (1.575 m)   Wt 236 lb (107 kg)   LMP 10/12/2012   BMI 43.16 kg/m  Last Weight:  Wt Readings from Last 1 Encounters:  03/13/23 236 lb (107 kg)   Last Height:   Ht Readings from Last 1 Encounters:  03/13/23 5\' 2"  (1.575 m)   DEFERRED RIGHT ARM IN [facility] s/p surgery  Physical exam: Exam: Gen: NAD, conversant, well nourised, obese, well groomed                     CV: RRR, no MRG. No Carotid Bruits. No peripheral edema, warm, nontender Eyes: Conjunctivae clear without exudates or hemorrhage  Neuro: Detailed Neurologic Exam  Speech:    Speech is normal; fluent and spontaneous with normal comprehension.  Cognition:     03/13/2023    8:51 AM  Montreal Cognitive Assessment   Visuospatial/ Executive (0/5) 4  Naming (0/3) 3  Attention: Read list  of digits (0/2) 2  Attention: Read list of letters (0/1) 1  Attention: Serial 7 subtraction starting at 100 (0/3) 1  Language: Repeat phrase (0/2) 2  Language : Fluency (0/1) 1  Abstraction (0/2) 2  Delayed Recall (0/5) 5  Orientation (0/6) 6  Total 27       The patient is oriented to person, place, and time;     recent and remote memory intact;     language fluent;     normal attention, concentration,     fund of knowledge Cranial Nerves:    The pupils are equal, round, and reactive to light. The fundi are normal and spontaneous venous pulsations are present. Visual fields are full to finger confrontation. Extraocular movements are intact. Trigeminal sensation is intact and the muscles of mastication are normal. The face is symmetric. The palate elevates in the midline. Hearing intact. Voice is normal. Shoulder shrug is normal. The tongue has normal motion without fasciculations.   Coordination:DEFERRED RIGHT ARM IN [place] s/p surgery nml  Gait: Not oarkinsonian, slightly wide based due to large body habitus  Motor Observation:    No asymmetry, no atrophy, and no involuntary movements noted. Tone:    Normal muscle tone.     Posture:    Posture is normal. normal erect    Strength: DEFERRED RIGHT ARM IN [place] s/p surgery    Strength is V/V in the upper and lower limbs.      Sensation: intact to LT; DEFERRED RIGHT ARM IN [place] s/p surgery     Reflex Exam:  DTR's: 1= AJs brick otherwiseToes:    The toes are equiv bilaterally.   DEFERRED RIGHT ARM IN [place] s/p surgery Clonus:    Clonus is absent.    Assessment/Plan:  Melissa [name] is a 57 y.o. female here as requested by Julien Girt, PA* for memory loss. has S/P right UKR; Expected blood loss anemia; Obese; Acquired hypothyroidism; Osteoarthritis of right knee; Osteoarthritis of left knee; Atrophy of left kidney; Chronic back pain; Diaphragmatic hernia; DDD (degenerative disc disease), cervical; History of systemic reaction to bee sting; Hydronephrosis of right kidney; Major depression, recurrent, chronic (HCC); Neck pain; Obstructive sleep apnea syndrome, mild; Spondylolisthesis; Vitamin D deficiency; Constipated; Hypothyroidism; Prediabetes; Lumbar radiculopathy; Other hyperlipidemia; Major depressive disorder, recurrent episode, mild with anxious distress (HCC); Hyperkalemia; Insulin resistance; SOB (shortness of breath); Lumbar spine pain; S/P lumbar fusion; DOE (dyspnea on exertion); Allergic rhinitis; Class 3 severe obesity with serious comorbidity and body mass index (BMI) of 45.0 to 49.9 in adult [facility]); Depression; Bilateral chronic knee pain; and Status post reverse total arthroplasty of right shoulder on their problem list.  Patient is here for memory loss.  I could not find any brain imaging in epic or "Care Everywhere".  From a thorough review of records it appears as though she just had status post reverse total arthroplasty of the right shoulder with Dr. Everardo Pacific April 24 to February 16, 2023.  She had memory loss issues for 20 years and that started from a thyroid issues. Forgets things she has to do, not good with names, relatively sttable. Lives  alone, not getting lost, no accidents in the home, performing all IADLs and ADLs no problems there. She loses time. There is confusion sometimes doing things. For 20 years has not changed. Chronic pain. Disabled. Joint pain. Also major recurrent depression, anxiety. She has weakness in the legs and arms, comes and goes. Also episodes of facial numbnes and tongling.No other focal neurologic deficits,  associated symptoms, inciting events or modifiable factors. Quit drinking 17 years she drank very heavily 4-6 beers a day for 20 years.no fjx demrntia. Fhx strokes.   - she lost weight and had another sleep test which was negative but has gained back weight, follow up with sleep provider because sleep apnea can cause strokes, heart disease, memory loss - EEG - look for seizures - MRI brain w/wo contrast - look for strokes or other etiologies of memory loss, confusion - Formal memory testing - esp because of 20+ year history of heavy drinking (no smoking or drugs) Dr. Clayborn [name]  Orders Placed This Encounter  Procedures   MR BRAIN W WO CONTRAST   Vitamin B1   RPR   HIV Antibody (routine testing w rflx)   Homocysteine   Ammonia   Ambulatory referral to Neuropsychology   EEG adult   No orders of the defined types were placed in this encounter.   Cc: Julien Girt, PA*,  Eartha Inch, MD  Melissa Dean, MD  [facility] [address] [place], [place] [address]  Phone [phone] Fax [phone]

## 2023-03-14 LAB — AMMONIA: Ammonia: 60 ug/dL (ref 34–178)

## 2023-03-14 LAB — RPR: RPR Ser Ql: NONREACTIVE

## 2023-03-15 ENCOUNTER — Encounter: Payer: Self-pay | Admitting: Neurology

## 2023-03-15 LAB — FUNGAL ORGANISM REFLEX

## 2023-03-15 LAB — FUNGUS CULTURE WITH STAIN

## 2023-03-15 LAB — FUNGUS CULTURE RESULT

## 2023-03-16 ENCOUNTER — Encounter (INDEPENDENT_AMBULATORY_CARE_PROVIDER_SITE_OTHER): Payer: Self-pay | Admitting: Adult Health

## 2023-03-16 ENCOUNTER — Ambulatory Visit (INDEPENDENT_AMBULATORY_CARE_PROVIDER_SITE_OTHER): Payer: Medicare HMO | Admitting: Adult Health

## 2023-03-16 VITALS — BP 130/81 | HR 74 | Temp 98.2°F | Ht 62.0 in | Wt 236.0 lb

## 2023-03-16 DIAGNOSIS — R7303 Prediabetes: Secondary | ICD-10-CM | POA: Diagnosis not present

## 2023-03-16 DIAGNOSIS — E669 Obesity, unspecified: Secondary | ICD-10-CM | POA: Diagnosis not present

## 2023-03-16 DIAGNOSIS — I10 Essential (primary) hypertension: Secondary | ICD-10-CM

## 2023-03-16 DIAGNOSIS — Z6841 Body Mass Index (BMI) 40.0 and over, adult: Secondary | ICD-10-CM

## 2023-03-16 DIAGNOSIS — R413 Other amnesia: Secondary | ICD-10-CM

## 2023-03-16 DIAGNOSIS — M19011 Primary osteoarthritis, right shoulder: Secondary | ICD-10-CM | POA: Diagnosis not present

## 2023-03-16 NOTE — Progress Notes (Signed)
WEIGHT SUMMARY AND BIOMETRICS  Vitals Temp: 98.2 F (36.8 C) BP: 130/81 Pulse Rate: 74 SpO2: 97 %   Anthropometric Measurements Height: 5\' 2"  (1.575 m) Weight: 236 lb (107 kg) BMI (Calculated): 43.15 Weight at Last Visit: 224 lb Weight Gained Since Last Visit: 12 lb Starting Weight: 256 lb   Body Composition  Body Fat %: 51.4 % Fat Mass (lbs): 121.6 lbs Muscle Mass (lbs): 109 lbs Total Body Water (lbs): 81.4 lbs Visceral Fat Rating : 17   Other Clinical Data Labs: No Today's Visit #: 44 Starting Date: 11/18/19    Chief Complaint:   OBESITY [name] is here to discuss her progress with her obesity treatment plan. She is on the the Category 3 Plan and states she is following her eating plan approximately 50 % of the time. She states she is not currently exercising-status post reverse total arthroplasty of the right shoulder with Dr. Everardo Pacific April 24 to February 16, 2023.   Interim History:  Status post reverse total arthroplasty of the right shoulder with Dr. Everardo Pacific April 24 to February 16, 2023.  She denies any current R shoulder pain.  Hunger/appetite-increased CHO cravings since April Surgery, ie pizza, breaded items, sweets Sleep- average 5-6 hrs/night Exercise-will start Out Patient PT June 2 x week Hydration-she estimates to drink 20 oz water/day  Subjective:   1. Hypertension, unspecified type PCP recently started her on daily Losartan 25mg  She denies dizziness BP stable at OV  2. Memory loss 03/13/2023 Dr. Ahern/Neurology OV Notes HPI:  Melissa [name] is a 57 y.o. female here as requested by Julien Girt, PA* for memory loss. has S/P right UKR; Expected blood loss anemia; Obese; Acquired hypothyroidism; Osteoarthritis of right knee; Osteoarthritis of left knee; Atrophy of left kidney; Chronic back pain; Diaphragmatic hernia; DDD (degenerative disc disease), cervical; History of systemic reaction to bee sting; Hydronephrosis of right kidney; Major  depression, recurrent, chronic (HCC); Neck pain; Obstructive sleep apnea syndrome, mild; Spondylolisthesis; Vitamin D deficiency; Constipated; Hypothyroidism; Prediabetes; Lumbar radiculopathy; Other hyperlipidemia; Major depressive disorder, recurrent episode, mild with anxious distress (HCC); Hyperkalemia; Insulin resistance; SOB (shortness of breath); Lumbar spine pain; S/P lumbar fusion; DOE (dyspnea on exertion); Allergic rhinitis; Class 3 severe obesity with serious comorbidity and body mass index (BMI) of 45.0 to 49.9 in adult [facility]); Depression; Bilateral chronic knee pain; and Status post reverse total arthroplasty of right shoulder on their problem list.   Patient is here for memory loss.  I could not find any brain imaging in epic or "Care Everywhere".  From a thorough review of records it appears as though she just had status post reverse total arthroplasty of the right shoulder with Dr. Everardo Pacific April 24 to February 16, 2023.  She had memory loss issues for 20 years and that started from a thyroid issues. Forgets things she has to do, not good with names, relatively sttable. Lives alone, not getting lost, no accidents in the home, performing all IADLs and ADLs no problems there. She loses time. There is confusion sometimes doing things. For 20 years has not changed. Chronic pain. Disabled. Joint pain. Also major recurrent depression, anxiety. She has weakness in the legs and arms, comes and goes. Also episodes of facial numbnes and tongling.No other focal neurologic deficits, associated symptoms, inciting events or modifiable factors. Quit drinking 17 years she drank very heavily 4-6 beers a day for 20 years. Assessment/Plan:  Melissa [name] is a 57 y.o. female here as requested by Julien Girt,  PA* for memory loss. has S/P right UKR; Expected blood loss anemia; Obese; Acquired hypothyroidism; Osteoarthritis of right knee; Osteoarthritis of left knee; Atrophy of left kidney; Chronic back pain;  Diaphragmatic hernia; DDD (degenerative disc disease), cervical; History of systemic reaction to bee sting; Hydronephrosis of right kidney; Major depression, recurrent, chronic (HCC); Neck pain; Obstructive sleep apnea syndrome, mild; Spondylolisthesis; Vitamin D deficiency; Constipated; Hypothyroidism; Prediabetes; Lumbar radiculopathy; Other hyperlipidemia; Major depressive disorder, recurrent episode, mild with anxious distress (HCC); Hyperkalemia; Insulin resistance; SOB (shortness of breath); Lumbar spine pain; S/P lumbar fusion; DOE (dyspnea on exertion); Allergic rhinitis; Class 3 severe obesity with serious comorbidity and body mass index (BMI) of 45.0 to 49.9 in adult [facility]); Depression; Bilateral chronic knee pain; and Status post reverse total arthroplasty of right shoulder on their problem list.   Patient is here for memory loss.  I could not find any brain imaging in epic or "Care Everywhere".  From a thorough review of records it appears as though she just had status post reverse total arthroplasty of the right shoulder with Dr. Everardo Pacific April 24 to February 16, 2023.  She had memory loss issues for 20 years and that started from a thyroid issues. Forgets things she has to do, not good with names, relatively sttable. Lives alone, not getting lost, no accidents in the home, performing all IADLs and ADLs no problems there. She loses time. There is confusion sometimes doing things. For 20 years has not changed. Chronic pain. Disabled. Joint pain. Also major recurrent depression, anxiety. She has weakness in the legs and arms, comes and goes. Also episodes of facial numbnes and tongling.No other focal neurologic deficits, associated symptoms, inciting events or modifiable factors. Quit drinking 17 years she drank very heavily 4-6 beers a day for 20 years.no fjx demrntia. Fhx strokes.    - she lost weight and had another sleep test which was negative but has gained back weight, follow up with sleep provider  because sleep apnea can cause strokes, heart disease, memory loss - EEG - look for seizures - MRI brain w/wo contrast - look for strokes or other etiologies of memory loss, confusion - Formal memory testing - esp because of 20+ year history of heavy drinking (no smoking or drugs) Dr. Clayborn Heron   3. Pre-diabetes Lab Results  Component Value Date   HGBA1C 5.8 (H) 01/04/2023   HGBA1C 5.7 (H) 05/05/2022   HGBA1C 5.7 (H) 01/11/2022   She has remained on daily Metformin 500mg  QD She reports increase in CHO cravings since R Shoulder    Assessment/Plan:   1. Hypertension, unspecified type Continue daily ARB F/U with new PCP Mar 21, 2023  2. Memory loss F/U with Neurology as directed  3. Pre-diabetes Continue daily Metformin- does not require RF today  4. Obesity, current BMI 41.14  Quatina is currently in the action stage of change. As such, her goal is to continue with weight loss efforts. She has agreed to the Category 3 Plan.   Exercise goals: All adults should avoid inactivity. Some physical activity is better than none, and adults who participate in any amount of physical activity gain some health benefits. Out Patient PT June 2 x week  Behavioral modification strategies: increasing lean protein intake, decreasing simple carbohydrates, increasing vegetables, increasing water intake, meal planning and cooking strategies, keeping healthy foods in the home, and planning for success.  Wondra has agreed to follow-up with our clinic in 4 weeks. She was informed of the importance of frequent follow-up  visits to maximize her success with intensive lifestyle modifications for her multiple health conditions.   Objective:   Blood pressure 130/81, pulse 74, temperature 98.2 F (36.8 C), height 5\' 2"  (1.575 m), weight 236 lb (107 kg), last menstrual period 10/12/2012, SpO2 97 %. Body mass index is 43.16 kg/m.  General: Cooperative, alert, well developed, in no acute distress. HEENT:  Conjunctivae and lids unremarkable. Cardiovascular: Regular rhythm.  Lungs: Normal work of breathing. Neurologic: No focal deficits.   Lab Results  Component Value Date   CREATININE 0.88 02/03/2023   BUN 18 02/03/2023   NA 138 02/03/2023   K 4.5 02/03/2023   CL 103 02/03/2023   CO2 26 02/03/2023   Lab Results  Component Value Date   ALT 21 02/03/2023   AST 17 02/03/2023   ALKPHOS 88 02/03/2023   BILITOT 0.6 02/03/2023   Lab Results  Component Value Date   HGBA1C 5.8 (H) 01/04/2023   HGBA1C 5.7 (H) 05/05/2022   HGBA1C 5.7 (H) 01/11/2022   HGBA1C 5.6 12/01/2021   HGBA1C 5.7 (H) 07/15/2021   Lab Results  Component Value Date   INSULIN 14.0 01/04/2023   INSULIN 10.6 05/05/2022   INSULIN 8.3 01/11/2022   INSULIN 12.5 12/01/2021   INSULIN 8.8 07/15/2021   Lab Results  Component Value Date   TSH 1.580 12/01/2021   Lab Results  Component Value Date   CHOL 207 (H) 01/11/2022   HDL 86 01/11/2022   LDLCALC 109 (H) 01/11/2022   TRIG 69 01/11/2022   CHOLHDL 2.4 01/11/2022   Lab Results  Component Value Date   VD25OH 57.1 01/04/2023   VD25OH 61.5 05/05/2022   VD25OH 71.2 01/11/2022   Lab Results  Component Value Date   WBC 8.2 02/03/2023   HGB 12.7 02/03/2023   HCT 41.0 02/03/2023   MCV 87.2 02/03/2023   PLT 354 02/03/2023   No results found for: "IRON", "TIBC", "FERRITIN"  Attestation Statements:   Reviewed by clinician on day of visit: allergies, medications, problem list, medical history, surgical history, family history, social history, and previous encounter notes.  Time spent on visit including pre-visit chart review and post-visit care and charting was 28 minutes.   I have reviewed the above documentation for accuracy and completeness, and I agree with the above. -  Panayiotis Rainville d. Cayli Escajeda, NP-C

## 2023-03-19 LAB — VITAMIN B1: Thiamine: 300.5 nmol/L — ABNORMAL HIGH (ref 66.5–200.0)

## 2023-03-19 LAB — HOMOCYSTEINE: Homocysteine: 8.5 umol/L (ref 0.0–14.5)

## 2023-03-19 LAB — HIV ANTIBODY (ROUTINE TESTING W REFLEX): HIV Screen 4th Generation wRfx: NONREACTIVE

## 2023-03-23 ENCOUNTER — Telehealth: Payer: Self-pay | Admitting: *Deleted

## 2023-03-23 NOTE — Telephone Encounter (Signed)
-----   Message from Windell Norfolk, MD sent at 03/23/2023  5:47 PM EDT ----- Please inform patient that recent labs within normal limits. Thiamine elevated but he is taking thiamine supplement.

## 2023-03-23 NOTE — Progress Notes (Signed)
Please inform patient that recent labs within normal limits. Thiamine elevated but he is taking thiamine supplement.

## 2023-03-27 DIAGNOSIS — M25511 Pain in right shoulder: Secondary | ICD-10-CM | POA: Diagnosis not present

## 2023-03-30 ENCOUNTER — Ambulatory Visit: Payer: Medicare HMO | Admitting: Neurology

## 2023-03-30 DIAGNOSIS — F0390 Unspecified dementia without behavioral disturbance: Secondary | ICD-10-CM

## 2023-03-30 DIAGNOSIS — R4182 Altered mental status, unspecified: Secondary | ICD-10-CM

## 2023-03-30 DIAGNOSIS — I679 Cerebrovascular disease, unspecified: Secondary | ICD-10-CM

## 2023-03-30 DIAGNOSIS — R29898 Other symptoms and signs involving the musculoskeletal system: Secondary | ICD-10-CM

## 2023-03-30 DIAGNOSIS — H9313 Tinnitus, bilateral: Secondary | ICD-10-CM

## 2023-03-30 DIAGNOSIS — Z823 Family history of stroke: Secondary | ICD-10-CM

## 2023-03-30 DIAGNOSIS — R202 Paresthesia of skin: Secondary | ICD-10-CM

## 2023-03-30 DIAGNOSIS — E079 Disorder of thyroid, unspecified: Secondary | ICD-10-CM

## 2023-03-30 DIAGNOSIS — R413 Other amnesia: Secondary | ICD-10-CM

## 2023-03-30 NOTE — Procedures (Signed)
    History:  57 year old woman with events concerning for seizure  EEG classification:  Awake and asleep  Description of the recording: The background rhythms of this recording consists of a fairly well modulated low amplitude beta activity. As the record progresses, the patient initially is in the waking state, but appears to enter the early stage II sleep during the recording, with rudimentary sleep spindles and vertex sharp wave activity seen. During the wakeful state, photic stimulation is performed, and no abnormal responses were seen. Hyperventilation was also performed, no abnormal response seen. No epileptiform discharges seen during this recording. There was no focal slowing.   Abnormality: None   Impression: This is an essentially normal EEG recording in the waking and sleeping state. The beta activity is likely a side effect from medication. No evidence of interictal epileptiform discharges. Normal EEGs, however, do not rule out epilepsy.    Windell Norfolk, MD [facility]

## 2023-03-31 DIAGNOSIS — M25511 Pain in right shoulder: Secondary | ICD-10-CM | POA: Diagnosis not present

## 2023-04-03 DIAGNOSIS — M25511 Pain in right shoulder: Secondary | ICD-10-CM | POA: Diagnosis not present

## 2023-04-05 ENCOUNTER — Ambulatory Visit (INDEPENDENT_AMBULATORY_CARE_PROVIDER_SITE_OTHER): Payer: Medicare HMO | Admitting: Adult Health

## 2023-04-05 ENCOUNTER — Encounter (INDEPENDENT_AMBULATORY_CARE_PROVIDER_SITE_OTHER): Payer: Self-pay | Admitting: Adult Health

## 2023-04-05 VITALS — BP 131/77 | HR 84 | Temp 98.2°F | Ht 62.0 in | Wt 236.0 lb

## 2023-04-05 DIAGNOSIS — R7303 Prediabetes: Secondary | ICD-10-CM | POA: Diagnosis not present

## 2023-04-05 DIAGNOSIS — E559 Vitamin D deficiency, unspecified: Secondary | ICD-10-CM

## 2023-04-05 DIAGNOSIS — Z0000 Encounter for general adult medical examination without abnormal findings: Secondary | ICD-10-CM

## 2023-04-05 DIAGNOSIS — E669 Obesity, unspecified: Secondary | ICD-10-CM

## 2023-04-05 DIAGNOSIS — Z6841 Body Mass Index (BMI) 40.0 and over, adult: Secondary | ICD-10-CM | POA: Diagnosis not present

## 2023-04-05 MED ORDER — METFORMIN HCL 500 MG PO TABS: ORAL_TABLET | ORAL | 0 refills | Status: DC

## 2023-04-05 NOTE — Progress Notes (Signed)
WEIGHT SUMMARY AND BIOMETRICS  Vitals Temp: 98.2 F (36.8 C) BP: 131/77 Pulse Rate: 84 SpO2: 95 %   Anthropometric Measurements Height: 5\' 2"  (1.575 m) Weight: 236 lb (107 kg) BMI (Calculated): 43.15 Weight at Last Visit: 236 lb Weight Lost Since Last Visit: 0 Weight Gained Since Last Visit: 0 Starting Weight: 256 lb Total Weight Loss (lbs): 20 lb (9.072 kg) Peak Weight: 268 lb   Body Composition  Body Fat %: 50.5 % Fat Mass (lbs): 119.6 lbs Muscle Mass (lbs): 111.2 lbs Total Body Water (lbs): 80.8 lbs Visceral Fat Rating : 17   Other Clinical Data Labs: no Today's Visit #: 42 Starting Date: 11/18/19    Chief Complaint:   OBESITY Melissa [name] is here to discuss her progress with her obesity treatment plan. She is on the the Category 3 Plan and states she is following her eating plan approximately 75 % of the time. She states she is exercising Walking and YardWork 30 minutes 3-5 times per week.   Interim History:  She has been cleared to lift 9 lbs and below. Hunger/appetite-Currently on Metformin 500mg  at breakfast - reports increased hunger late afternoon and evening.  Reviewed Bioimpedance Results with pt: Muscle Mass: +2.2 lbs Adipose Mass: - 2 lbs  Subjective:   1. Healthcare maintenance She reports worsening snoring and fatigue- the last several months. OSA + She previously worse CPAP, stopped after weight loss  2. Pre-diabetes Lab Results  Component Value Date   HGBA1C 5.8 (H) 01/04/2023   HGBA1C 5.7 (H) 05/05/2022   HGBA1C 5.7 (H) 01/11/2022     Latest Reference Range & Units 05/05/22 09:20 01/04/23 08:54  INSULIN 2.6 - 24.9 uIU/mL 10.6 14.0   Currently on Metformin 500mg  at breakfast - reports increased hunger late afternoon and evening. Her insurance will not cover any form of GLP-1 or GIP/GLP-1 therapy. She denies family hx of MEN 2 or MTC She denies personal hx of pancreatitis  3. Vitamin D deficiency She is on OTC Vit D 3 -1,000  IU 2 tabs QD  Latest Reference Range & Units 05/05/22 09:20 01/04/23 08:54  Vitamin D, 25-Hydroxy 30.0 - 100.0 ng/mL 61.5 57.1    Assessment/Plan:   1. Healthcare maintenance Order Sleep Study  2. Pre-diabetes Refill and INCREASE - metFORMIN (GLUCOPHAGE) 500 MG tablet; 1 tab at lunch 1 tab at dinner  Dispense: 180 tablet; Refill: 0  3. Vitamin D deficiency Continue OTC Supplementation  4. Obesity, current BMI 43.3  Melissa [name] is currently in the action stage of change. As such, her goal is to continue with weight loss efforts. She has agreed to the Category 3 Plan.   Exercise goals: All adults should avoid inactivity. Some physical activity is better than none, and adults who participate in any amount of physical activity gain some health benefits. Adults should also include muscle-strengthening activities that involve all major muscle groups on 2 or more days a week.  Behavioral modification strategies: increasing lean protein intake, decreasing simple carbohydrates, increasing vegetables, increasing water intake, no skipping meals, meal planning and cooking strategies, and planning for success.  Melissa [name] has agreed to follow-up with our clinic in 4 weeks. She was informed of the importance of frequent follow-up visits to maximize her success with intensive lifestyle modifications for her multiple health conditions.   Check Fasting Labs at end of summer 2024  Objective:   Blood pressure 131/77, pulse 84, temperature 98.2 F (36.8 C), height 5\' 2"  (1.575 m), weight 236 lb (107  kg), last menstrual period 10/12/2012, SpO2 95 %. Body mass index is 43.16 kg/m.  General: Cooperative, alert, well developed, in no acute distress. HEENT: Conjunctivae and lids unremarkable. Cardiovascular: Regular rhythm.  Lungs: Normal work of breathing. Neurologic: No focal deficits.   Lab Results  Component Value Date   CREATININE 0.88 02/03/2023   BUN 18 02/03/2023   NA 138 02/03/2023   K 4.5  02/03/2023   CL 103 02/03/2023   CO2 26 02/03/2023   Lab Results  Component Value Date   ALT 21 02/03/2023   AST 17 02/03/2023   ALKPHOS 88 02/03/2023   BILITOT 0.6 02/03/2023   Lab Results  Component Value Date   HGBA1C 5.8 (H) 01/04/2023   HGBA1C 5.7 (H) 05/05/2022   HGBA1C 5.7 (H) 01/11/2022   HGBA1C 5.6 12/01/2021   HGBA1C 5.7 (H) 07/15/2021   Lab Results  Component Value Date   INSULIN 14.0 01/04/2023   INSULIN 10.6 05/05/2022   INSULIN 8.3 01/11/2022   INSULIN 12.5 12/01/2021   INSULIN 8.8 07/15/2021   Lab Results  Component Value Date   TSH 1.580 12/01/2021   Lab Results  Component Value Date   CHOL 207 (H) 01/11/2022   HDL 86 01/11/2022   LDLCALC 109 (H) 01/11/2022   TRIG 69 01/11/2022   CHOLHDL 2.4 01/11/2022   Lab Results  Component Value Date   VD25OH 57.1 01/04/2023   VD25OH 61.5 05/05/2022   VD25OH 71.2 01/11/2022   Lab Results  Component Value Date   WBC 8.2 02/03/2023   HGB 12.7 02/03/2023   HCT 41.0 02/03/2023   MCV 87.2 02/03/2023   PLT 354 02/03/2023   No results found for: "IRON", "TIBC", "FERRITIN"  Attestation Statements:   Reviewed by clinician on day of visit: allergies, medications, problem list, medical history, surgical history, family history, social history, and previous encounter notes.  I have reviewed the above documentation for accuracy and completeness, and I agree with the above. -  Melissa [name] d. Mikel Pyon, NP-C

## 2023-04-07 DIAGNOSIS — M25511 Pain in right shoulder: Secondary | ICD-10-CM | POA: Diagnosis not present

## 2023-04-10 DIAGNOSIS — M25511 Pain in right shoulder: Secondary | ICD-10-CM | POA: Diagnosis not present

## 2023-04-14 DIAGNOSIS — M25511 Pain in right shoulder: Secondary | ICD-10-CM | POA: Diagnosis not present

## 2023-04-17 DIAGNOSIS — M25511 Pain in right shoulder: Secondary | ICD-10-CM | POA: Diagnosis not present

## 2023-04-20 DIAGNOSIS — M25511 Pain in right shoulder: Secondary | ICD-10-CM | POA: Diagnosis not present

## 2023-04-21 DIAGNOSIS — R7301 Impaired fasting glucose: Secondary | ICD-10-CM | POA: Diagnosis not present

## 2023-04-21 DIAGNOSIS — E039 Hypothyroidism, unspecified: Secondary | ICD-10-CM | POA: Diagnosis not present

## 2023-04-21 DIAGNOSIS — E782 Mixed hyperlipidemia: Secondary | ICD-10-CM | POA: Diagnosis not present

## 2023-04-21 DIAGNOSIS — Z5941 Food insecurity: Secondary | ICD-10-CM | POA: Diagnosis not present

## 2023-04-21 DIAGNOSIS — I10 Essential (primary) hypertension: Secondary | ICD-10-CM | POA: Diagnosis not present

## 2023-04-21 DIAGNOSIS — F0390 Unspecified dementia without behavioral disturbance: Secondary | ICD-10-CM | POA: Diagnosis not present

## 2023-04-21 DIAGNOSIS — F339 Major depressive disorder, recurrent, unspecified: Secondary | ICD-10-CM | POA: Diagnosis not present

## 2023-04-22 ENCOUNTER — Other Ambulatory Visit: Payer: Medicare HMO

## 2023-04-26 DIAGNOSIS — M25511 Pain in right shoulder: Secondary | ICD-10-CM | POA: Diagnosis not present

## 2023-05-01 DIAGNOSIS — M25511 Pain in right shoulder: Secondary | ICD-10-CM | POA: Diagnosis not present

## 2023-05-08 DIAGNOSIS — M25511 Pain in right shoulder: Secondary | ICD-10-CM | POA: Diagnosis not present

## 2023-05-10 ENCOUNTER — Ambulatory Visit (INDEPENDENT_AMBULATORY_CARE_PROVIDER_SITE_OTHER): Payer: Medicare HMO | Admitting: Adult Health

## 2023-05-10 ENCOUNTER — Encounter (INDEPENDENT_AMBULATORY_CARE_PROVIDER_SITE_OTHER): Payer: Self-pay | Admitting: Adult Health

## 2023-05-10 VITALS — BP 121/77 | HR 78 | Temp 98.1°F | Ht 62.0 in | Wt 244.0 lb

## 2023-05-10 DIAGNOSIS — E039 Hypothyroidism, unspecified: Secondary | ICD-10-CM

## 2023-05-10 DIAGNOSIS — I519 Heart disease, unspecified: Secondary | ICD-10-CM

## 2023-05-10 DIAGNOSIS — E669 Obesity, unspecified: Secondary | ICD-10-CM | POA: Diagnosis not present

## 2023-05-10 DIAGNOSIS — Z6841 Body Mass Index (BMI) 40.0 and over, adult: Secondary | ICD-10-CM | POA: Diagnosis not present

## 2023-05-10 DIAGNOSIS — R7303 Prediabetes: Secondary | ICD-10-CM

## 2023-05-10 NOTE — Progress Notes (Signed)
WEIGHT SUMMARY AND BIOMETRICS  Vitals Temp: 98.1 F (36.7 C) BP: 121/77 Pulse Rate: 78 SpO2: 98 %   Anthropometric Measurements Height: 5\' 2"  (1.575 m) Weight: 244 lb (110.7 kg) BMI (Calculated): 44.62 Weight at Last Visit: 236lb Weight Lost Since Last Visit: 0 Weight Gained Since Last Visit: 8lb Starting Weight: 256lb Total Weight Loss (lbs): 12 lb (5.443 kg) Peak Weight: 268lb   Body Composition  Body Fat %: 52.9 % Fat Mass (lbs): 129.2 lbs Muscle Mass (lbs): 109.4 lbs Total Body Water (lbs): 85.2 lbs Visceral Fat Rating : 18   Other Clinical Data Fasting: Yes Labs: no Today's Visit #: 52 Starting Date: 11/18/19    Chief Complaint:   OBESITY Kateri is here to discuss her progress with her obesity treatment plan. She is on the the Category 3 Plan and states she is following her eating plan approximately 75 % of the time. She states she is exercising Walking 20 minutes 7 times per week.   Interim History:  Ms. Mish was re-evaluated by trainer at [facility] and plans on resuming regular exercise at facility soon.  Her PCP recently sent her for CT Cardiac Calcium Scoring and is trying to have [facility] approved for Heart Disease  Subjective:   1. Heart disease 04/25/2023 9:32 AM EDT  TECHNIQUE: Thin cut axial acquisition through the heart without contrast.  Standard Agatston scoring algorithm. COMPARISON: None. INDICATION: Mixed hyperlipidemia FINDINGS: - Total Calcium Score: 109. -- Left Main: 78. -- Left Anterior Descending: 1. -- Circumflex: 13. -- Right Coronary: 17 -- Posterior Descending Artery: 0 - Cardiovascular:  Normal heart size. No pericardial effusion. Visualized thoracic aorta is normal in caliber. Main pulmonary artery is normal in caliber. - Mediastinum: No adenopathy. - Visible abdomen: No significant abnormality. - Visible lung fields: No significant abnormality. - Miscellaneous: None.   HTN + HLD, recommend GLP-1  therapy.  PCP recently sent in Rx- awaiting approval for [facility] therapy.  2. Hypothyroidism, unspecified type (ABNORMAL) TSH (04/21/2023 8:53 AM EDT) Lab Results - (ABNORMAL) TSH (04/21/2023 8:53 AM EDT) Component Value Ref Range Test Method Analysis Time Performed At Pathologist Signature  TSH 13.900 (H)       PCP Increased Levothyroxine from to on/about 04/22/23 She endorses hot/cold intolerances She denies CP/palpitations  3. Pre-diabetes Lab Results  Component Value Date   HGBA1C 5.8 (H) 01/04/2023   HGBA1C 5.7 (H) 05/05/2022   HGBA1C 5.7 (H) 01/11/2022    She is currently on Metformin 500mg  BID with meals PCP is trying to start [facility] therapy- great!  Assessment/Plan:   1. Heart disease Start [facility] as directed  2. Hypothyroidism, unspecified type Continue Levothyroxine , f/u with PCP for repeat labs  3. Pre-diabetes Continue Metformin therapy and hopefully start [facility] therapy per PCP  4. Obesity, current BMI 44.62  Zoanne is currently in the action stage of change. As such, her goal is to continue with weight loss efforts. She has agreed to the Category 3 Plan.   Exercise goals: All adults should avoid inactivity. Some physical activity is better than none, and adults who participate in any amount of physical activity gain some health benefits. Adults should also include muscle-strengthening activities that involve all major muscle groups on 2 or more days a week.  Behavioral modification strategies: increasing lean protein intake, decreasing simple carbohydrates, increasing vegetables, increasing water intake, no skipping meals, meal planning and cooking strategies, and planning for success.  Melissa [name] has agreed to follow-up with our  clinic in 4 weeks. She was informed of the importance of frequent follow-up visits to maximize her success with intensive lifestyle modifications for her multiple health conditions.   Objective:   Blood pressure  121/77, pulse 78, temperature 98.1 F (36.7 C), height 5\' 2"  (1.575 m), weight 244 lb (110.7 kg), last menstrual period 10/12/2012, SpO2 98%. Body mass index is 44.63 kg/m.  General: Cooperative, alert, well developed, in no acute distress. HEENT: Conjunctivae and lids unremarkable. Cardiovascular: Regular rhythm.  Lungs: Normal work of breathing. Neurologic: No focal deficits.   Lab Results  Component Value Date   CREATININE 0.88 02/03/2023   BUN 18 02/03/2023   NA 138 02/03/2023   K 4.5 02/03/2023   CL 103 02/03/2023   CO2 26 02/03/2023   Lab Results  Component Value Date   ALT 21 02/03/2023   AST 17 02/03/2023   ALKPHOS 88 02/03/2023   BILITOT 0.6 02/03/2023   Lab Results  Component Value Date   HGBA1C 5.8 (H) 01/04/2023   HGBA1C 5.7 (H) 05/05/2022   HGBA1C 5.7 (H) 01/11/2022   HGBA1C 5.6 12/01/2021   HGBA1C 5.7 (H) 07/15/2021   Lab Results  Component Value Date   INSULIN 14.0 01/04/2023   INSULIN 10.6 05/05/2022   INSULIN 8.3 01/11/2022   INSULIN 12.5 12/01/2021   INSULIN 8.8 07/15/2021   Lab Results  Component Value Date   TSH 1.580 12/01/2021   Lab Results  Component Value Date   CHOL 207 (H) 01/11/2022   HDL 86 01/11/2022   LDLCALC 109 (H) 01/11/2022   TRIG 69 01/11/2022   CHOLHDL 2.4 01/11/2022   Lab Results  Component Value Date   VD25OH 57.1 01/04/2023   VD25OH 61.5 05/05/2022   VD25OH 71.2 01/11/2022   Lab Results  Component Value Date   WBC 8.2 02/03/2023   HGB 12.7 02/03/2023   HCT 41.0 02/03/2023   MCV 87.2 02/03/2023   PLT 354 02/03/2023   No results found for: "IRON", "TIBC", "FERRITIN"  Attestation Statements:   Reviewed by clinician on day of visit: allergies, medications, problem list, medical history, surgical history, family history, social history, and previous encounter notes.  Time spent on visit including pre-visit chart review and post-visit care and charting was 28 minutes.   I have reviewed the above  documentation for accuracy and completeness, and I agree with the above. -  Mikeala Girdler d. Pearlie Lafosse, NP-C

## 2023-05-15 ENCOUNTER — Encounter (HOSPITAL_BASED_OUTPATIENT_CLINIC_OR_DEPARTMENT_OTHER): Payer: Self-pay

## 2023-05-15 ENCOUNTER — Other Ambulatory Visit: Payer: Self-pay

## 2023-05-15 ENCOUNTER — Emergency Department (HOSPITAL_BASED_OUTPATIENT_CLINIC_OR_DEPARTMENT_OTHER)
Admission: EM | Admit: 2023-05-15 | Discharge: 2023-05-15 | Disposition: A | Discharge status: Home / Self Care | Payer: Medicare HMO | Attending: Emergency Medicine | Admitting: Emergency Medicine

## 2023-05-15 DIAGNOSIS — T7840XA Allergy, unspecified, initial encounter: Secondary | ICD-10-CM

## 2023-05-15 DIAGNOSIS — R21 Rash and other nonspecific skin eruption: Secondary | ICD-10-CM | POA: Diagnosis not present

## 2023-05-15 DIAGNOSIS — T63441A Toxic effect of venom of bees, accidental (unintentional), initial encounter: Secondary | ICD-10-CM

## 2023-05-15 MED ORDER — FAMOTIDINE 20 MG PO TABS: 20.0000 mg | ORAL_TABLET | Freq: Two times a day (BID) | ORAL | 0 refills | Status: DC

## 2023-05-15 MED ORDER — EPINEPHRINE 0.3 MG/0.3ML IJ SOAJ: 0.3000 mg | INTRAMUSCULAR | 0 refills | Status: DC | PRN

## 2023-05-15 MED ORDER — FAMOTIDINE IN NACL 20-0.9 MG/50ML-% IV SOLN
20.0000 mg | Freq: Once | INTRAVENOUS | Status: AC
  Administered 2023-05-15: 20 mg via INTRAVENOUS
  Filled 2023-05-15: qty 50

## 2023-05-15 MED ORDER — HYDROXYZINE HCL 25 MG PO TABS: 25.0000 mg | ORAL_TABLET | Freq: Four times a day (QID) | ORAL | 0 refills | Status: DC

## 2023-05-15 MED ORDER — SODIUM CHLORIDE 0.9 % IV BOLUS
1000.0000 mL | Freq: Once | INTRAVENOUS | Status: AC
  Administered 2023-05-15: 1000 mL via INTRAVENOUS

## 2023-05-15 MED ORDER — METHYLPREDNISOLONE SODIUM SUCC 125 MG IJ SOLR
125.0000 mg | Freq: Once | INTRAMUSCULAR | Status: AC
  Administered 2023-05-15: 125 mg via INTRAVENOUS
  Filled 2023-05-15: qty 2

## 2023-05-15 NOTE — ED Notes (Signed)
Reviewed AVS/discharge instruction with patient. Time allotted for and all questions answered. Patient is agreeable for d/c and escorted to ed exit by staff.  

## 2023-05-15 NOTE — Discharge Instructions (Addendum)
It was a pleasure taking care of you here in the emergency department  We have added a few medications to her regimen which you will take for 5 days  Pepcid, 20 mg for 5 days Atarax which is a medication for itching as needed over the next 5 days  I refilled your EpiPen  Return for any worsening symptoms

## 2023-05-15 NOTE — ED Provider Notes (Signed)
[place] EMERGENCY DEPARTMENT AT [facility] Provider Note   CSN: [id number] Arrival date & time: 05/15/23  1949     History  Chief Complaint  Patient presents with   Insect Bite    Melissa [name] is a 57 y.o. female here for evaluation of allergic reaction.  She was working out in the yard when she was stung by multiple bees/wasps prior to arrival.  She has multiple sites where she was stung.  She noted some diffuse urticaria.  Took 4 Benadryl PTA (100 mg).  Rash is pruritic in nature.  No nausea, vomiting, chest pain, shortness of breath, sore throat, scratchy throat, sensation of throat closing.  She does carry an EpiPen however is never had to use it.  Was otherwise well prior to incident.  HPI     Home Medications Prior to Admission medications   Medication Sig Start Date End Date Taking? Authorizing Provider  EPINEPHrine 0.3 mg/0.3 mL IJ SOAJ injection Inject 0.3 mg into the muscle as needed for anaphylaxis. 05/15/23  Yes Blia Totman A, PA-C  famotidine (PEPCID) 20 MG tablet Take 1 tablet (20 mg total) by mouth 2 (two) times daily. 05/15/23  Yes Evelena Masci A, PA-C  hydrOXYzine (ATARAX) 25 MG tablet Take 1 tablet (25 mg total) by mouth every 6 (six) hours. 05/15/23  Yes Jonnelle Lawniczak A, PA-C  Ascorbic Acid (VITAMIN C) 1000 MG tablet Take 3,000 mg by mouth at bedtime.    [provider]  BREYNA 80-4.5 MCG/ACT inhaler Inhale 2 puffs into the lungs daily as needed.    [provider]  buPROPion (WELLBUTRIN XL) 300 MG 24 hr tablet Take 300 mg by mouth daily. 05/14/20   [provider]  busPIRone (BUSPAR) 5 MG tablet Take 5 mg by mouth 2 (two) times daily as needed for anxiety. 06/15/20   [provider]  chlorpheniramine (CHLOR-TRIMETON) 4 MG tablet Take 4 mg by mouth daily.    [provider]  cholecalciferol (VITAMIN D3) 25 MCG (1000 UNIT) tablet Take 1,000 Units by mouth daily.    [provider]   diphenhydrAMINE (BENADRYL) 25 mg capsule Take 1 capsule (25 mg total) by mouth every 6 (six) hours as needed for itching, allergies or sleep. 11/06/12   Lanney Gins, PA-C  EPINEPHrine 0.3 mg/0.3 mL IJ SOAJ injection Inject 0.3 mg into the muscle as needed for anaphylaxis. 04/23/13   [provider]  FLUoxetine (PROZAC) 40 MG capsule Take 40 mg by mouth daily.    [provider]  gabapentin (NEURONTIN) 100 MG capsule Take 1 capsule (100 mg total) by mouth 3 (three) times daily. For pain 02/16/23   Vernetta Honey, PA-C  levothyroxine (SYNTHROID) 125 MCG tablet Take 125 mcg by mouth daily before breakfast. 10/20/21   [provider]  losartan (COZAAR) 25 MG tablet Take 25 mg by mouth daily.    [provider]  meclizine (ANTIVERT) 25 MG tablet Take 25 mg by mouth 3 (three) times daily as needed for dizziness.    [provider]  metFORMIN (GLUCOPHAGE) 500 MG tablet 1 tab at lunch 1 tab at dinner 04/05/23   Danford, Orpha Bur D, NP  montelukast (SINGULAIR) 10 MG tablet Take 10 mg by mouth at bedtime.    [provider]  mupirocin ointment (BACTROBAN) 2 % Apply 1 Application topically daily as needed (irritation).    [provider]  Naphazoline-Pheniramine ([employer] EYE ALLERGY RELIEF OP) Place 1 drop into both eyes daily as needed (  allergies).    [provider]  omeprazole (PRILOSEC) 40 MG capsule Take 40 mg by mouth at bedtime.     [provider]  pravastatin (PRAVACHOL) 80 MG tablet Take 80 mg by mouth at bedtime. 10/20/21   [provider]  triamcinolone cream (KENALOG) 0.5 % Apply 1 Application topically daily as needed (irritation).    [provider]      Allergies    Bee venom and Lactose intolerance (gi)    Review of Systems   Review of Systems  Constitutional: Negative.   HENT: Negative.    Respiratory: Negative.    Cardiovascular: Negative.   Gastrointestinal: Negative.   Genitourinary:  Negative.   Musculoskeletal: Negative.   Skin:  Positive for rash. Negative for wound.  Neurological: Negative.   All other systems reviewed and are negative.   Physical Exam Updated Vital Signs BP (!) 175/87 (BP Location: Right Arm)   Pulse 90   Temp (!) 97.4 F (36.3 C) (Temporal)   Resp 20   Ht 5\' 2"  (1.575 m)   Wt 110.7 kg   LMP 10/12/2012   SpO2 98%   BMI 44.63 kg/m  Physical Exam Vitals and nursing note reviewed.  Constitutional:      General: She is not in acute distress.    Appearance: She is well-developed. She is not ill-appearing, toxic-appearing or diaphoretic.  HENT:     Head: Normocephalic and atraumatic.     Nose: Nose normal.     Mouth/Throat:     Mouth: Mucous membranes are moist.     Comments: Posterior oropharynx clear.  No angioedema, tongue midline.  No pooling of secretions Eyes:     Pupils: Pupils are equal, round, and reactive to light.  Neck:     Comments: No stridor Cardiovascular:     Rate and Rhythm: Normal rate.     Pulses: Normal pulses.     Heart sounds: Normal heart sounds.  Pulmonary:     Effort: Pulmonary effort is normal. No respiratory distress.     Breath sounds: Normal breath sounds.     Comments: Clear bilaterally, speaks in full sentences without difficulty Abdominal:     General: Bowel sounds are normal. There is no distension.     Palpations: Abdomen is soft.     Tenderness: There is no abdominal tenderness. There is no right CVA tenderness, left CVA tenderness or guarding.  Musculoskeletal:        General: Normal range of motion.     Cervical back: Normal range of motion.  Skin:    General: Skin is warm and dry.     Capillary Refill: Capillary refill takes less than 2 seconds.     Comments: Diffuse urticaria anterior posterior trunk, left forearm.  Multiple bee stings.  Neurological:     General: No focal deficit present.     Mental Status: She is alert.     Cranial Nerves: No cranial nerve deficit.     Sensory: No  sensory deficit.     Motor: No weakness.     Gait: Gait normal.  Psychiatric:        Mood and Affect: Mood normal.     ED Results / Procedures / Treatments   Labs (all labs ordered are listed, but only abnormal results are displayed) Labs Reviewed - No data to display  EKG None  Radiology No results found.  Procedures Procedures    Medications Ordered in ED Medications  methylPREDNISolone sodium succinate (SOLU-MEDROL) 125  mg/2 mL injection 125 mg (125 mg Intravenous Given 05/15/23 2016)  famotidine (PEPCID) IVPB 20 mg premix (20 mg Intravenous New Bag/Given 05/15/23 2018)  sodium chloride 0.9 % bolus 1,000 mL (1,000 mLs Intravenous New Bag/Given 05/15/23 2015)    ED Course/ Medical Decision Making/ A&P   57 year old here for evaluation of allergic reaction.  Stung by a flying insect prior to arrival.  Took Benadryl PTA.  She has diffuse urticaria, has multiple sites where she was stung.  She appears otherwise well.  Normal vital signs.  No sensation of throat closing, shortness of breath, nausea or vomiting.  Will hold off on EpiPen at this time.  She does not need more Benadryl given she took this prior to arrival.  Will add in steroids, fluids, Pepcid, observe and reassess  Reassessed.  Urticaria resolved.  DC home with symptomatic management, encouraged return for new or worsening symptoms.  The patient has been appropriately medically screened and/or stabilized in the ED. I have low suspicion for any other emergent medical condition which would require further screening, evaluation or treatment in the ED or require inpatient management.  Patient is hemodynamically stable and in no acute distress.  Patient able to ambulate in department prior to ED.  Evaluation does not show acute pathology that would require ongoing or additional emergent interventions while in the emergency department or further inpatient treatment.  I have discussed the diagnosis with the patient and  answered all questions.  Pain is been managed while in the emergency department and patient has no further complaints prior to discharge.  Patient is comfortable with plan discussed in room and is stable for discharge at this time.  I have discussed strict return precautions for returning to the emergency department.  Patient was encouraged to follow-up with PCP/specialist refer to at discharge.                             Medical Decision Making Amount and/or Complexity of Data Reviewed Independent Historian: spouse External Data Reviewed: labs, radiology and notes.  Risk OTC drugs. Prescription drug management. Parenteral controlled substances. Decision regarding hospitalization. Diagnosis or treatment significantly limited by social determinants of health.           Final Clinical Impression(s) / ED Diagnoses Final diagnoses:  Allergic reaction, initial encounter  Bee sting, accidental or unintentional, initial encounter    Rx / DC Orders ED Discharge Orders          Ordered    hydrOXYzine (ATARAX) 25 MG tablet  Every 6 hours        05/15/23 2125    famotidine (PEPCID) 20 MG tablet  2 times daily        05/15/23 2125    EPINEPHrine 0.3 mg/0.3 mL IJ SOAJ injection  As needed        05/15/23 2125              Breckyn Troyer A, PA-C 05/15/23 2126    Ernie Avena, MD 05/15/23 2235

## 2023-05-15 NOTE — ED Triage Notes (Signed)
Patient here POV from Home.  Endorses being stung be bees/wasps today about an hour ago. Multiple sting sites. Swelling to Arms, Chest, Back. 100 mg of Benadryl about an hour ago.  No SOB. No Itchy Throat.   NAD Noted during Triage. A&Ox4. GCS 15. Ambulatory.

## 2023-05-17 DIAGNOSIS — M5451 Vertebrogenic low back pain: Secondary | ICD-10-CM | POA: Diagnosis not present

## 2023-05-19 DIAGNOSIS — M19011 Primary osteoarthritis, right shoulder: Secondary | ICD-10-CM | POA: Diagnosis not present

## 2023-05-30 ENCOUNTER — Other Ambulatory Visit: Payer: Medicare HMO

## 2023-06-09 DIAGNOSIS — I2510 Atherosclerotic heart disease of native coronary artery without angina pectoris: Secondary | ICD-10-CM | POA: Diagnosis not present

## 2023-06-09 DIAGNOSIS — E782 Mixed hyperlipidemia: Secondary | ICD-10-CM | POA: Diagnosis not present

## 2023-06-09 DIAGNOSIS — S91002A Unspecified open wound, left ankle, initial encounter: Secondary | ICD-10-CM | POA: Diagnosis not present

## 2023-06-09 DIAGNOSIS — Z6841 Body Mass Index (BMI) 40.0 and over, adult: Secondary | ICD-10-CM | POA: Diagnosis not present

## 2023-06-09 DIAGNOSIS — E039 Hypothyroidism, unspecified: Secondary | ICD-10-CM | POA: Diagnosis not present

## 2023-06-12 ENCOUNTER — Encounter (INDEPENDENT_AMBULATORY_CARE_PROVIDER_SITE_OTHER): Payer: Self-pay | Admitting: Adult Health

## 2023-06-12 ENCOUNTER — Ambulatory Visit (INDEPENDENT_AMBULATORY_CARE_PROVIDER_SITE_OTHER): Payer: Medicare HMO | Admitting: Adult Health

## 2023-06-12 VITALS — BP 115/75 | HR 81 | Temp 98.4°F | Ht 62.0 in | Wt 240.0 lb

## 2023-06-12 DIAGNOSIS — Z0000 Encounter for general adult medical examination without abnormal findings: Secondary | ICD-10-CM

## 2023-06-12 DIAGNOSIS — E669 Obesity, unspecified: Secondary | ICD-10-CM | POA: Diagnosis not present

## 2023-06-12 DIAGNOSIS — Z6841 Body Mass Index (BMI) 40.0 and over, adult: Secondary | ICD-10-CM

## 2023-06-12 DIAGNOSIS — I519 Heart disease, unspecified: Secondary | ICD-10-CM | POA: Diagnosis not present

## 2023-06-12 DIAGNOSIS — E7849 Other hyperlipidemia: Secondary | ICD-10-CM

## 2023-06-12 NOTE — Progress Notes (Signed)
WEIGHT SUMMARY AND BIOMETRICS  Vitals Temp: 98.4 F (36.9 C) BP: 115/75 Pulse Rate: 81 SpO2: 99 %   Anthropometric Measurements Height: 5\' 2"  (1.575 m) Weight: 240 lb (108.9 kg) BMI (Calculated): 43.89 Weight at Last Visit: 244lb Weight Lost Since Last Visit: 4lb Weight Gained Since Last Visit: 0 Starting Weight: 256lb Total Weight Loss (lbs): 16 lb (7.258 kg) Peak Weight: 268lb   Body Composition  Body Fat %: 51.4 % Fat Mass (lbs): 123.8 lbs Muscle Mass (lbs): 111 lbs Total Body Water (lbs): 81.6 lbs Visceral Fat Rating : 18   Other Clinical Data Fasting: no Labs: no Today's Visit #: 58 Starting Date: 11/18/19    Chief Complaint:   OBESITY Melissa [name] is here to discuss her progress with her obesity treatment plan. She is on the the Category 3 Plan and states she is following her eating plan approximately 50 % of the time. She states she is exercising House/yard work 30 minutes 5 times per week.   Interim History:  Her neighbors of 27 years- sold home- will be relocating back to outside of [place], [place]. Their youngest daughter "Melissa [name]" age 57 will be moving with Melissa [name].   "Melissa [name]" will bring companionship, furniture, and help to care for animals.  Dr. Shon [name] recently released her without restrictions, Melissa [name] is now 1 year out from an OLIF L4-5 with posterior pedicle screw fixation   Subjective:   1. Other hyperlipidemia 06/09/2023 PCP stopped pravastatin 80mg  and started rosuvastatin 20mg - she denies myalgias PCP recently sent in [facility] Rx- awaiting on insurance approval  2. Heart disease She denies family hx of MEN 2 or MTC She denies personal hx of pancreatitis  PCP recently sent in [facility] Rx- awaiting on insurance approval  04/25/2023 9:32 AM EDT  TECHNIQUE: Thin cut axial acquisition through the heart without contrast.  Standard Agatston scoring algorithm. COMPARISON: None. INDICATION: Mixed hyperlipidemia FINDINGS: - Total Calcium Score:  109. -- Left Main: 78. -- Left Anterior Descending: 1. -- Circumflex: 13. -- Right Coronary: 17 -- Posterior Descending Artery: 0 - Cardiovascular:  Normal heart size. No pericardial effusion. Visualized thoracic aorta is normal in caliber. Main pulmonary artery is normal in caliber. - Mediastinum: No adenopathy. - Visible abdomen: No significant abnormality. - Visible lung fields: No significant abnormality. - Miscellaneous: None.    3. Healthcare maintenance Ortho/Dr. Viviana [name] notes 05/17/2023 05/17/2023 Melissa [name] is now 1 year out from an OLIF L4-5 with posterior pedicle screw fixation. Overall her pain level 3/10 and she is improving. She will continue with her self-directed exercise program. I have also encouraged her to consider joining [employer] and starting a water aerobics class. I did tell her that with the increased weight gain she will have more back pain and she is working with her nutritionist. Patient can follow-up with me on an as needed basis. No restrictions.   Assessment/Plan:   1. Other hyperlipidemia Continue current statin therapy and f/u with PCP Nov 2024  2. Heart disease Will send OV notes from [facility] to insurance company to assist with Wegovy coverage HOPING [employer] WILL DO THE RIGHT BY PATIENT  3. Healthcare maintenance Continue with weight loss efforts Resume activity as tolerated F/u with Orthopedic care team as directed  4. Obesity, current BMI 44.82  Melissa [name] is currently in the action stage of change. As such, her goal is to continue with weight loss efforts. She has agreed to the Category 3 Plan.   Exercise goals: All adults should avoid inactivity.  Some physical activity is better than none, and adults who participate in any amount of physical activity gain some health benefits. Adults should also include muscle-strengthening activities that involve all major muscle groups on 2 or more days a week.  Behavioral modification strategies: increasing lean protein  intake, decreasing simple carbohydrates, increasing vegetables, increasing water intake, no skipping meals, meal planning and cooking strategies, keeping healthy foods in the home, avoiding temptations, and planning for success.  Melissa [name] has agreed to follow-up with our clinic in 4 weeks. She was informed of the importance of frequent follow-up visits to maximize her success with intensive lifestyle modifications for her multiple health conditions.   Objective:   Blood pressure 115/75, pulse 81, temperature 98.4 F (36.9 C), height 5\' 2"  (1.575 m), weight 240 lb (108.9 kg), last menstrual period 10/12/2012, SpO2 99%. Body mass index is 43.9 kg/m.  General: Cooperative, alert, well developed, in no acute distress. HEENT: Conjunctivae and lids unremarkable. Cardiovascular: Regular rhythm.  Lungs: Normal work of breathing. Neurologic: No focal deficits.   Lab Results  Component Value Date   CREATININE 0.88 02/03/2023   BUN 18 02/03/2023   NA 138 02/03/2023   K 4.5 02/03/2023   CL 103 02/03/2023   CO2 26 02/03/2023   Lab Results  Component Value Date   ALT 21 02/03/2023   AST 17 02/03/2023   ALKPHOS 88 02/03/2023   BILITOT 0.6 02/03/2023   Lab Results  Component Value Date   HGBA1C 5.8 (H) 01/04/2023   HGBA1C 5.7 (H) 05/05/2022   HGBA1C 5.7 (H) 01/11/2022   HGBA1C 5.6 12/01/2021   HGBA1C 5.7 (H) 07/15/2021   Lab Results  Component Value Date   INSULIN 14.0 01/04/2023   INSULIN 10.6 05/05/2022   INSULIN 8.3 01/11/2022   INSULIN 12.5 12/01/2021   INSULIN 8.8 07/15/2021   Lab Results  Component Value Date   TSH 1.580 12/01/2021   Lab Results  Component Value Date   CHOL 207 (H) 01/11/2022   HDL 86 01/11/2022   LDLCALC 109 (H) 01/11/2022   TRIG 69 01/11/2022   CHOLHDL 2.4 01/11/2022   Lab Results  Component Value Date   VD25OH 57.1 01/04/2023   VD25OH 61.5 05/05/2022   VD25OH 71.2 01/11/2022   Lab Results  Component Value Date   WBC 8.2 02/03/2023   HGB  12.7 02/03/2023   HCT 41.0 02/03/2023   MCV 87.2 02/03/2023   PLT 354 02/03/2023   No results found for: "IRON", "TIBC", "FERRITIN"  Attestation Statements:   Reviewed by clinician on day of visit: allergies, medications, problem list, medical history, surgical history, family history, social history, and previous encounter notes.  I have reviewed the above documentation for accuracy and completeness, and I agree with the above. -  Caswell Alvillar d. Dewanna Hurston, NP-C

## 2023-06-13 ENCOUNTER — Encounter (INDEPENDENT_AMBULATORY_CARE_PROVIDER_SITE_OTHER): Payer: Self-pay | Admitting: Adult Health

## 2023-06-21 ENCOUNTER — Ambulatory Visit: Admission: RE | Admit: 2023-06-21 | Payer: Medicare HMO | Source: Ambulatory Visit

## 2023-06-21 DIAGNOSIS — R413 Other amnesia: Secondary | ICD-10-CM | POA: Diagnosis not present

## 2023-06-21 DIAGNOSIS — R202 Paresthesia of skin: Secondary | ICD-10-CM | POA: Diagnosis not present

## 2023-06-21 DIAGNOSIS — H9313 Tinnitus, bilateral: Secondary | ICD-10-CM

## 2023-06-21 DIAGNOSIS — Z823 Family history of stroke: Secondary | ICD-10-CM | POA: Diagnosis not present

## 2023-06-21 DIAGNOSIS — E079 Disorder of thyroid, unspecified: Secondary | ICD-10-CM | POA: Diagnosis not present

## 2023-06-21 DIAGNOSIS — R29898 Other symptoms and signs involving the musculoskeletal system: Secondary | ICD-10-CM

## 2023-06-21 DIAGNOSIS — F0390 Unspecified dementia without behavioral disturbance: Secondary | ICD-10-CM

## 2023-06-21 MED ORDER — GADOPICLENOL 0.5 MMOL/ML IV SOLN
10.0000 mL | Freq: Once | INTRAVENOUS | Status: AC | PRN
  Administered 2023-06-21: 10 mL via INTRAVENOUS

## 2023-06-28 DIAGNOSIS — M5451 Vertebrogenic low back pain: Secondary | ICD-10-CM | POA: Diagnosis not present

## 2023-06-30 ENCOUNTER — Other Ambulatory Visit (INDEPENDENT_AMBULATORY_CARE_PROVIDER_SITE_OTHER): Payer: Self-pay | Admitting: Adult Health

## 2023-06-30 DIAGNOSIS — R7303 Prediabetes: Secondary | ICD-10-CM

## 2023-07-13 ENCOUNTER — Encounter (INDEPENDENT_AMBULATORY_CARE_PROVIDER_SITE_OTHER): Payer: Self-pay | Admitting: Adult Health

## 2023-07-13 ENCOUNTER — Ambulatory Visit (INDEPENDENT_AMBULATORY_CARE_PROVIDER_SITE_OTHER): Payer: Medicare HMO | Admitting: Adult Health

## 2023-07-13 VITALS — BP 133/80 | HR 71 | Temp 97.9°F | Ht 62.0 in | Wt 244.0 lb

## 2023-07-13 DIAGNOSIS — R7303 Prediabetes: Secondary | ICD-10-CM | POA: Diagnosis not present

## 2023-07-13 DIAGNOSIS — I519 Heart disease, unspecified: Secondary | ICD-10-CM | POA: Diagnosis not present

## 2023-07-13 DIAGNOSIS — E669 Obesity, unspecified: Secondary | ICD-10-CM

## 2023-07-13 DIAGNOSIS — Z6841 Body Mass Index (BMI) 40.0 and over, adult: Secondary | ICD-10-CM | POA: Diagnosis not present

## 2023-07-13 DIAGNOSIS — E559 Vitamin D deficiency, unspecified: Secondary | ICD-10-CM

## 2023-07-13 MED ORDER — METFORMIN HCL 500 MG PO TABS: ORAL_TABLET | ORAL | 0 refills | Status: DC

## 2023-07-13 NOTE — Progress Notes (Signed)
WEIGHT SUMMARY AND BIOMETRICS  Vitals Temp: 97.9 F (36.6 C) BP: 133/80 Pulse Rate: 71 SpO2: 97 %   Anthropometric Measurements Height: 5\' 2"  (1.575 m) Weight: 244 lb (110.7 kg) BMI (Calculated): 44.62 Weight at Last Visit: 240lb Weight Lost Since Last Visit: 0 Weight Gained Since Last Visit: 4lb Starting Weight: 256lb Total Weight Loss (lbs): 12 lb (5.443 kg) Peak Weight: 268lb   Body Composition  Body Fat %: 53 % Fat Mass (lbs): 129.6 lbs Muscle Mass (lbs): 109 lbs Total Body Water (lbs): 83.8 lbs Visceral Fat Rating : 18   Other Clinical Data Fasting: yes Labs: no Today's Visit #: 60 Starting Date: 11/18/19    Chief Complaint:   OBESITY Melissa [name] is here to discuss her progress with her obesity treatment plan. She is on the the Category 3 Plan and states she is following her eating plan approximately 70 % of the time. She states she is exercising Walking and Helping Neighbors Move 15-20 minutes 7 times per week.   Interim History:  Melissa [name] has been helping her neighbors pack and preparation for move to [place].  She has been walking daily.  She reports increase in chronic back pain- she will start PT per Dr. Shon Baton  Subjective:   1. Pre-diabetes Lab Results  Component Value Date   HGBA1C 5.8 (H) 01/04/2023   HGBA1C 5.7 (H) 05/05/2022   HGBA1C 5.7 (H) 01/11/2022    She has remained on Metformin 500mg  BID with meals Her insurance continues to deny Wegovy therapy-discussed how pt can initiate and appeal for Rx therapy  2. Heart disease 04/25/2023 9:32 AM EDT  TECHNIQUE: Thin cut axial acquisition through the heart without contrast.  Standard Agatston scoring algorithm. COMPARISON: None. INDICATION: Mixed hyperlipidemia FINDINGS: - Total Calcium Score: 109. -- Left Main: 78. -- Left Anterior Descending: 1. -- Circumflex: 13. -- Right Coronary: 17 -- Posterior Descending Artery: 0 - Cardiovascular:  Normal heart size. No pericardial  effusion. Visualized thoracic aorta is normal in caliber. Main pulmonary artery is normal in caliber. - Mediastinum: No adenopathy. - Visible abdomen: No significant abnormality. - Visible lung fields: No significant abnormality. - Miscellaneous: None.   She denies family hx of MEN 2 or MTC She denies personal hx of pancreatitis Wegovy therapy denied- discussed how pt can initiate and appeal for Rx therapy  3. Vitamin D deficiency  Latest Reference Range & Units 01/04/23 08:54  Vitamin D, 25-Hydroxy 30.0 - 100.0 ng/mL 57.1   cholecalciferol (VITAMIN D3) 25 MCG (1000 UNIT) tablet Take 1,000 Units by mouth daily.   Assessment/Plan:   1. Pre-diabetes Check Labs - metFORMIN (GLUCOPHAGE) 500 MG tablet; 1 tab at lunch 1 tab at dinner  Dispense: 180 tablet; Refill: 0 - Comprehensive metabolic panel - Hemoglobin A1c - Insulin, random - Vitamin B12  2. Heart disease Initiate a pt appeal for [facility] therapy  3. Vitamin D deficiency Check Labs - VITAMIN D 25 Hydroxy (Vit-D Deficiency, Fractures)  3. Obesity, current BMI 44.62  Melissa [name] is currently in the action stage of change. As such, her goal is to continue with weight loss efforts. She has agreed to the Category 3 Plan.   Exercise goals: All adults should avoid inactivity. Some physical activity is better than none, and adults who participate in any amount of physical activity gain some health benefits. Adults should also include muscle-strengthening activities that involve all major muscle groups on 2 or more days a week.  Behavioral modification strategies: increasing lean protein  intake, decreasing simple carbohydrates, increasing vegetables, increasing water intake, no skipping meals, meal planning and cooking strategies, and planning for success.  Celesta has agreed to follow-up with our clinic in 4 weeks. She was informed of the importance of frequent follow-up visits to maximize her success with intensive lifestyle modifications  for her multiple health conditions.   Lamont was informed we would discuss her lab results at her next visit unless there is a critical issue that needs to be addressed sooner. Melissa [name] agreed to keep her next visit at the agreed upon time to discuss these results.  Objective:   Blood pressure 133/80, pulse 71, temperature 97.9 F (36.6 C), height 5\' 2"  (1.575 m), weight 244 lb (110.7 kg), last menstrual period 10/12/2012, SpO2 97%. Body mass index is 44.63 kg/m.  General: Cooperative, alert, well developed, in no acute distress. HEENT: Conjunctivae and lids unremarkable. Cardiovascular: Regular rhythm.  Lungs: Normal work of breathing. Neurologic: No focal deficits.   Lab Results  Component Value Date   CREATININE 0.88 02/03/2023   BUN 18 02/03/2023   NA 138 02/03/2023   K 4.5 02/03/2023   CL 103 02/03/2023   CO2 26 02/03/2023   Lab Results  Component Value Date   ALT 21 02/03/2023   AST 17 02/03/2023   ALKPHOS 88 02/03/2023   BILITOT 0.6 02/03/2023   Lab Results  Component Value Date   HGBA1C 5.8 (H) 01/04/2023   HGBA1C 5.7 (H) 05/05/2022   HGBA1C 5.7 (H) 01/11/2022   HGBA1C 5.6 12/01/2021   HGBA1C 5.7 (H) 07/15/2021   Lab Results  Component Value Date   INSULIN 14.0 01/04/2023   INSULIN 10.6 05/05/2022   INSULIN 8.3 01/11/2022   INSULIN 12.5 12/01/2021   INSULIN 8.8 07/15/2021   Lab Results  Component Value Date   TSH 1.580 12/01/2021   Lab Results  Component Value Date   CHOL 207 (H) 01/11/2022   HDL 86 01/11/2022   LDLCALC 109 (H) 01/11/2022   TRIG 69 01/11/2022   CHOLHDL 2.4 01/11/2022   Lab Results  Component Value Date   VD25OH 57.1 01/04/2023   VD25OH 61.5 05/05/2022   VD25OH 71.2 01/11/2022   Lab Results  Component Value Date   WBC 8.2 02/03/2023   HGB 12.7 02/03/2023   HCT 41.0 02/03/2023   MCV 87.2 02/03/2023   PLT 354 02/03/2023   No results found for: "IRON", "TIBC", "FERRITIN"  Attestation Statements:   Reviewed by clinician  on day of visit: allergies, medications, problem list, medical history, surgical history, family history, social history, and previous encounter notes.  I have reviewed the above documentation for accuracy and completeness, and I agree with the above. -  Sergio Hobart d. Acquanetta Cabanilla, NP-C

## 2023-07-14 LAB — COMPREHENSIVE METABOLIC PANEL
ALT: 17 IU/L (ref 0–32)
AST: 17 IU/L (ref 0–40)
Albumin: 4.2 g/dL (ref 3.8–4.9)
Alkaline Phosphatase: 117 IU/L (ref 44–121)
BUN/Creatinine Ratio: 20 (ref 9–23)
BUN: 19 mg/dL (ref 6–24)
Bilirubin Total: <0.2 mg/dL (ref 0.0–1.2)
CO2: 20 mmol/L (ref 20–29)
Calcium: 9.6 mg/dL (ref 8.7–10.2)
Chloride: 101 mmol/L (ref 96–106)
Creatinine, Ser: 0.94 mg/dL (ref 0.57–1.00)
Globulin, Total: 2.4 g/dL (ref 1.5–4.5)
Glucose: 99 mg/dL (ref 70–99)
Potassium: 4.3 mmol/L (ref 3.5–5.2)
Sodium: 141 mmol/L (ref 134–144)
Total Protein: 6.6 g/dL (ref 6.0–8.5)
eGFR: 70 mL/min/{1.73_m2} (ref >59)

## 2023-07-14 LAB — HEMOGLOBIN A1C
Est. average glucose Bld gHb Est-mCnc: 123 mg/dL
Hgb A1c MFr Bld: 5.9 % — ABNORMAL HIGH (ref 4.8–5.6)

## 2023-07-14 LAB — INSULIN, RANDOM: INSULIN: 14.1 u[IU]/mL (ref 2.6–24.9)

## 2023-07-14 LAB — VITAMIN D 25 HYDROXY (VIT D DEFICIENCY, FRACTURES): Vit D, 25-Hydroxy: 47.9 ng/mL (ref 30.0–100.0)

## 2023-07-14 LAB — VITAMIN B12: Vitamin B-12: 727 pg/mL (ref 232–1245)

## 2023-08-14 ENCOUNTER — Ambulatory Visit (INDEPENDENT_AMBULATORY_CARE_PROVIDER_SITE_OTHER): Payer: Medicare HMO | Admitting: Adult Health

## 2023-08-14 ENCOUNTER — Encounter (INDEPENDENT_AMBULATORY_CARE_PROVIDER_SITE_OTHER): Payer: Self-pay | Admitting: Adult Health

## 2023-08-14 VITALS — BP 123/81 | HR 76 | Temp 97.9°F | Ht 62.0 in | Wt 242.0 lb

## 2023-08-14 DIAGNOSIS — E669 Obesity, unspecified: Secondary | ICD-10-CM | POA: Diagnosis not present

## 2023-08-14 DIAGNOSIS — Z6841 Body Mass Index (BMI) 40.0 and over, adult: Secondary | ICD-10-CM

## 2023-08-14 DIAGNOSIS — I519 Heart disease, unspecified: Secondary | ICD-10-CM

## 2023-08-14 DIAGNOSIS — E559 Vitamin D deficiency, unspecified: Secondary | ICD-10-CM

## 2023-08-14 DIAGNOSIS — R7303 Prediabetes: Secondary | ICD-10-CM

## 2023-08-14 NOTE — Progress Notes (Signed)
WEIGHT SUMMARY AND BIOMETRICS  Vitals Temp: 97.9 F (36.6 C) BP: 123/81 Pulse Rate: 76 SpO2: 97 %   Anthropometric Measurements Height: 5\' 2"  (1.575 m) Weight: 242 lb (109.8 kg) BMI (Calculated): 44.25 Weight at Last Visit: 244lb Weight Lost Since Last Visit: 2lb Weight Gained Since Last Visit: 0 Starting Weight: 256lb Total Weight Loss (lbs): 14 lb (6.35 kg) Peak Weight: 268lb   Body Composition  Body Fat %: 53.2 % Fat Mass (lbs): 128.8 lbs Muscle Mass (lbs): 107.4 lbs Total Body Water (lbs): 83.2 lbs Visceral Fat Rating : 18   Other Clinical Data Fasting: yes Labs: no Today's Visit #: 66 Starting Date: 11/18/19    Chief Complaint:   OBESITY Melissa [name] is here to discuss her progress with her obesity treatment plan. She is on the the Category 3 Plan and states she is following her eating plan approximately 75 % of the time. She states she is exercising House/Yard work daily.   Interim History:  Her new roommate "Melissa [name]" moved in 2-3 weeks ago. The new living arrangement has been successful! Melissa [name] reports cooking more and eating more healthy.  Melissa [name] brought 9 cats with her- so far the melded "pet families" have been able to share space harmoniously.  Melissa [name] has 2 cats and 2 dogs.  Melissa [name] reports stretching 5-10 mins each night.  Subjective:   1. Heart disease Dicussed labs 07/13/2023 CMP- Electrolytes, Kidney and Liver enzymes normal  PCP previously sent in [facility]- has since been denied multiple times.  She has not called to appeal denial.  She is unsure if PCP has appealed [employer] denial.   2. Pre-diabetes Dicussed labs Lab Results  Component Value Date   HGBA1C 5.9 (H) 07/13/2023   HGBA1C 5.8 (H) 01/04/2023   HGBA1C 5.7 (H) 05/05/2022     Latest Reference Range & Units 07/13/23 11:31  Glucose 70 - 99 mg/dL 99  Hemoglobin W0J 4.8 - 5.6 % 5.9 (H)  Est. average glucose Bld gHb Est-mCnc mg/dL 811  INSULIN 2.6 - 91.4 uIU/mL  14.1  (H): Data is abnormally high  CBG improved A1c worsened and insulin level essentially unchanged. She is on Metformin 500mg  1 tab at lunch and 1 tab at dinner Wegovy denied  3. Vitamin D deficiency Dicussed labs  Latest Reference Range & Units 07/13/23 11:31  Vitamin D, 25-Hydroxy 30.0 - 100.0 ng/mL 47.9   Level stable, only slightly below goal of 50-70 She is on daily OTC Vit D 3 1,000 cap- she will take 2-3 caps per day  Assessment/Plan:   1. Heart disease Provided information on how to initiate pt appeal for [facility] denial  2. Pre-diabetes Continue Metformin therapy. Increase daily protein intake If able, start Wegovy   3. Vitamin D deficiency Continue OTC Supplementation  4. Obesity, current BMI 44.25  Melissa [name] is currently in the action stage of change. As such, her goal is to continue with weight loss efforts. She has agreed to the Category 3 Plan.   Exercise goals: All adults should avoid inactivity. Some physical activity is better than none, and adults who participate in any amount of physical activity gain some health benefits. Adults should also include muscle-strengthening activities that involve all major muscle groups on 2 or more days a week.  Behavioral modification strategies: increasing lean protein intake, decreasing simple carbohydrates, increasing vegetables, increasing water intake, decreasing liquid calories, no skipping meals, meal planning and cooking strategies, keeping healthy foods in the home, ways to avoid  boredom eating, and planning for success.  Melissa [name] has agreed to follow-up with our clinic in 4 weeks. She was informed of the importance of frequent follow-up visits to maximize her success with intensive lifestyle modifications for her multiple health conditions.   Objective:   Blood pressure 123/81, pulse 76, temperature 97.9 F (36.6 C), height 5\' 2"  (1.575 m), weight 242 lb (109.8 kg), last menstrual period 10/12/2012, SpO2 97%. Body  mass index is 44.26 kg/m.  General: Cooperative, alert, well developed, in no acute distress. HEENT: Conjunctivae and lids unremarkable. Cardiovascular: Regular rhythm.  Lungs: Normal work of breathing. Neurologic: No focal deficits.   Lab Results  Component Value Date   CREATININE 0.94 07/13/2023   BUN 19 07/13/2023   NA 141 07/13/2023   K 4.3 07/13/2023   CL 101 07/13/2023   CO2 20 07/13/2023   Lab Results  Component Value Date   ALT 17 07/13/2023   AST 17 07/13/2023   ALKPHOS 117 07/13/2023   BILITOT <0.2 07/13/2023   Lab Results  Component Value Date   HGBA1C 5.9 (H) 07/13/2023   HGBA1C 5.8 (H) 01/04/2023   HGBA1C 5.7 (H) 05/05/2022   HGBA1C 5.7 (H) 01/11/2022   HGBA1C 5.6 12/01/2021   Lab Results  Component Value Date   INSULIN 14.1 07/13/2023   INSULIN 14.0 01/04/2023   INSULIN 10.6 05/05/2022   INSULIN 8.3 01/11/2022   INSULIN 12.5 12/01/2021   Lab Results  Component Value Date   TSH 1.580 12/01/2021   Lab Results  Component Value Date   CHOL 207 (H) 01/11/2022   HDL 86 01/11/2022   LDLCALC 109 (H) 01/11/2022   TRIG 69 01/11/2022   CHOLHDL 2.4 01/11/2022   Lab Results  Component Value Date   VD25OH 47.9 07/13/2023   VD25OH 57.1 01/04/2023   VD25OH 61.5 05/05/2022   Lab Results  Component Value Date   WBC 8.2 02/03/2023   HGB 12.7 02/03/2023   HCT 41.0 02/03/2023   MCV 87.2 02/03/2023   PLT 354 02/03/2023   No results found for: "IRON", "TIBC", "FERRITIN"  Attestation Statements:   Reviewed by clinician on day of visit: allergies, medications, problem list, medical history, surgical history, family history, social history, and previous encounter notes.  I have reviewed the above documentation for accuracy and completeness, and I agree with the above. -  Greyson Peavy d. Hulon Ferron, NP-C

## 2023-09-11 ENCOUNTER — Ambulatory Visit (INDEPENDENT_AMBULATORY_CARE_PROVIDER_SITE_OTHER): Payer: Medicare HMO | Admitting: Adult Health

## 2023-09-11 ENCOUNTER — Encounter (INDEPENDENT_AMBULATORY_CARE_PROVIDER_SITE_OTHER): Payer: Self-pay | Admitting: Adult Health

## 2023-09-11 VITALS — BP 111/73 | HR 80 | Temp 97.9°F | Ht 62.0 in | Wt 244.0 lb

## 2023-09-11 DIAGNOSIS — E559 Vitamin D deficiency, unspecified: Secondary | ICD-10-CM

## 2023-09-11 DIAGNOSIS — R7303 Prediabetes: Secondary | ICD-10-CM

## 2023-09-11 DIAGNOSIS — I519 Heart disease, unspecified: Secondary | ICD-10-CM

## 2023-09-11 DIAGNOSIS — Z6841 Body Mass Index (BMI) 40.0 and over, adult: Secondary | ICD-10-CM | POA: Diagnosis not present

## 2023-09-11 DIAGNOSIS — E669 Obesity, unspecified: Secondary | ICD-10-CM

## 2023-09-11 MED ORDER — METFORMIN HCL 500 MG PO TABS
ORAL_TABLET | ORAL | 0 refills | Status: DC
Start: 2023-09-11 — End: 2024-01-08

## 2023-09-11 NOTE — Progress Notes (Signed)
WEIGHT SUMMARY AND BIOMETRICS  Vitals Temp: 97.9 F (36.6 C) BP: 111/73 Pulse Rate: 80 SpO2: 98 %   Anthropometric Measurements Height: 5\' 2"  (1.575 m) Weight: 244 lb (110.7 kg) BMI (Calculated): 44.62 Weight at Last Visit: 242lb Weight Lost Since Last Visit: 0 Weight Gained Since Last Visit: 2lb Starting Weight: 256lb Total Weight Loss (lbs): 12 lb (5.443 kg) Peak Weight: 268lb   Body Composition  Body Fat %: 53.1 % Fat Mass (lbs): 129.8 lbs Muscle Mass (lbs): 108.8 lbs Total Body Water (lbs): 84.2 lbs Visceral Fat Rating : 18   Other Clinical Data Fasting: yes Labs: no Today's Visit #: 22 Starting Date: 11/18/19    Chief Complaint:   OBESITY Melissa [name] is here to discuss her progress with her obesity treatment plan. She is on the the Category 3 Plan and states she is following her eating plan approximately 70 % of the time.  She states she is exercising HouseWork/NEAT daily.   Interim History:  [name] endorses bilateral knee pain. Current pain scale- R knee rated 5/10 and L knee rated 1/10 She underwent partial R knee replacement in 2011  She will require bilateral knee replacements Current BMI 44.62 Required BMI for orthopedic surgery <40  Subjective:   1. Heart disease Lipid Panel     Component Value Date/Time   CHOL 207 (H) 01/11/2022 1217   TRIG 69 01/11/2022 1217   HDL 86 01/11/2022 1217   CHOLHDL 2.4 01/11/2022 1217   LDLCALC 109 (H) 01/11/2022 1217   LABVLDL 12 01/11/2022 1217   The 10-year ASCVD risk score (Arnett DK, et al., 2019) is: 4.4%   Values used to calculate the score:     Age: 57 years     Sex: Female     Is Non-Hispanic African American: No     Diabetic: Yes     Tobacco smoker: No     Systolic Blood Pressure: 111 mmHg     Is BP treated: Yes     HDL Cholesterol: 87 mg/dL     Total Cholesterol: 217 mg/dL   She is on daily Crestor 20mg - denies myaglias  2. Pre-diabetes Lab Results  Component Value Date    HGBA1C 5.9 (H) 07/13/2023   HGBA1C 5.8 (H) 01/04/2023   HGBA1C 5.7 (H) 05/05/2022    She is on Metformin 500mg  BID with meals- denies GI upset Her PCP tried valiantly to obtain insurance coverage for [facility] to secure approval  3. Vitamin D deficiency  She is on daily OTC Vit D3 1,000 international units daily She endorses stable energy levels  Assessment/Plan:   1. Heart disease Continue to limit saturated fat Continue daily statin therapy  2. Pre-diabetes Refill - metFORMIN (GLUCOPHAGE) 500 MG tablet; 1 tab at lunch 1 tab at dinner  Dispense: 180 tablet; Refill: 0  3. Vitamin D deficiency Continue  daily OTC Vit D3 1,000 international units daily  4. Obesity, current BMI 44.62  Melissa [name] is currently in the action stage of change. As such, her goal is to continue with weight loss efforts. She has agreed to the Category 3 Plan.   Exercise goals: All adults should avoid inactivity. Some physical activity is better than none, and adults who participate in any amount of physical activity gain some health benefits. Adults should also include muscle-strengthening activities that involve all major muscle groups on 2 or more days a week.  Behavioral modification strategies: increasing lean protein intake, decreasing simple carbohydrates, increasing vegetables, increasing water intake,  no skipping meals, meal planning and cooking strategies, keeping healthy foods in the home, and planning for success.  [name] has agreed to follow-up with our clinic in 4 weeks. She was informed of the importance of frequent follow-up visits to maximize her success with intensive lifestyle modifications for her multiple health conditions.   Objective:   Blood pressure 111/73, pulse 80, temperature 97.9 F (36.6 C), height 5\' 2"  (1.575 m), weight 244 lb (110.7 kg), last menstrual period 10/12/2012, SpO2 98%. Body mass index is 44.63 kg/m.  General: Cooperative, alert, well developed, in no acute  distress. HEENT: Conjunctivae and lids unremarkable. Cardiovascular: Regular rhythm.  Lungs: Normal work of breathing. Neurologic: No focal deficits.   Lab Results  Component Value Date   CREATININE 0.94 07/13/2023   BUN 19 07/13/2023   NA 141 07/13/2023   K 4.3 07/13/2023   CL 101 07/13/2023   CO2 20 07/13/2023   Lab Results  Component Value Date   ALT 17 07/13/2023   AST 17 07/13/2023   ALKPHOS 117 07/13/2023   BILITOT <0.2 07/13/2023   Lab Results  Component Value Date   HGBA1C 5.9 (H) 07/13/2023   HGBA1C 5.8 (H) 01/04/2023   HGBA1C 5.7 (H) 05/05/2022   HGBA1C 5.7 (H) 01/11/2022   HGBA1C 5.6 12/01/2021   Lab Results  Component Value Date   INSULIN 14.1 07/13/2023   INSULIN 14.0 01/04/2023   INSULIN 10.6 05/05/2022   INSULIN 8.3 01/11/2022   INSULIN 12.5 12/01/2021   Lab Results  Component Value Date   TSH 1.580 12/01/2021   Lab Results  Component Value Date   CHOL 207 (H) 01/11/2022   HDL 86 01/11/2022   LDLCALC 109 (H) 01/11/2022   TRIG 69 01/11/2022   CHOLHDL 2.4 01/11/2022   Lab Results  Component Value Date   VD25OH 47.9 07/13/2023   VD25OH 57.1 01/04/2023   VD25OH 61.5 05/05/2022   Lab Results  Component Value Date   WBC 8.2 02/03/2023   HGB 12.7 02/03/2023   HCT 41.0 02/03/2023   MCV 87.2 02/03/2023   PLT 354 02/03/2023   No results found for: "IRON", "TIBC", "FERRITIN"  Attestation Statements:   Reviewed by clinician on day of visit: allergies, medications, problem list, medical history, surgical history, family history, social history, and previous encounter notes.  I have reviewed the above documentation for accuracy and completeness, and I agree with the above. -  Marceline Napierala d. Krislyn Donnan, NP-C

## 2023-09-15 DIAGNOSIS — S86302A Unspecified injury of muscle(s) and tendon(s) of peroneal muscle group at lower leg level, left leg, initial encounter: Secondary | ICD-10-CM | POA: Diagnosis not present

## 2023-09-15 DIAGNOSIS — F339 Major depressive disorder, recurrent, unspecified: Secondary | ICD-10-CM | POA: Diagnosis not present

## 2023-09-15 DIAGNOSIS — J3089 Other allergic rhinitis: Secondary | ICD-10-CM | POA: Diagnosis not present

## 2023-09-15 DIAGNOSIS — G4733 Obstructive sleep apnea (adult) (pediatric): Secondary | ICD-10-CM | POA: Diagnosis not present

## 2023-09-15 DIAGNOSIS — R7301 Impaired fasting glucose: Secondary | ICD-10-CM | POA: Diagnosis not present

## 2023-09-15 DIAGNOSIS — Z1330 Encounter for screening examination for mental health and behavioral disorders, unspecified: Secondary | ICD-10-CM | POA: Diagnosis not present

## 2023-09-15 DIAGNOSIS — E782 Mixed hyperlipidemia: Secondary | ICD-10-CM | POA: Diagnosis not present

## 2023-09-15 DIAGNOSIS — N261 Atrophy of kidney (terminal): Secondary | ICD-10-CM | POA: Diagnosis not present

## 2023-09-15 DIAGNOSIS — E039 Hypothyroidism, unspecified: Secondary | ICD-10-CM | POA: Diagnosis not present

## 2023-10-02 DIAGNOSIS — M25572 Pain in left ankle and joints of left foot: Secondary | ICD-10-CM | POA: Diagnosis not present

## 2023-10-02 DIAGNOSIS — M79672 Pain in left foot: Secondary | ICD-10-CM | POA: Diagnosis not present

## 2023-10-02 DIAGNOSIS — M67962 Unspecified disorder of synovium and tendon, left lower leg: Secondary | ICD-10-CM | POA: Diagnosis not present

## 2023-10-04 DIAGNOSIS — M25572 Pain in left ankle and joints of left foot: Secondary | ICD-10-CM | POA: Diagnosis not present

## 2023-10-09 ENCOUNTER — Encounter (INDEPENDENT_AMBULATORY_CARE_PROVIDER_SITE_OTHER): Payer: Self-pay | Admitting: Adult Health

## 2023-10-10 ENCOUNTER — Ambulatory Visit (INDEPENDENT_AMBULATORY_CARE_PROVIDER_SITE_OTHER): Payer: Medicare HMO | Admitting: Adult Health

## 2023-10-10 ENCOUNTER — Encounter (INDEPENDENT_AMBULATORY_CARE_PROVIDER_SITE_OTHER): Payer: Self-pay | Admitting: Adult Health

## 2023-10-10 VITALS — BP 117/73 | HR 74 | Temp 98.3°F | Ht 62.0 in | Wt 243.0 lb

## 2023-10-10 DIAGNOSIS — E669 Obesity, unspecified: Secondary | ICD-10-CM

## 2023-10-10 DIAGNOSIS — I519 Heart disease, unspecified: Secondary | ICD-10-CM | POA: Diagnosis not present

## 2023-10-10 DIAGNOSIS — Z6841 Body Mass Index (BMI) 40.0 and over, adult: Secondary | ICD-10-CM | POA: Diagnosis not present

## 2023-10-10 DIAGNOSIS — R7303 Prediabetes: Secondary | ICD-10-CM | POA: Diagnosis not present

## 2023-10-10 NOTE — Progress Notes (Signed)
WEIGHT SUMMARY AND BIOMETRICS  Vitals Temp: 98.3 F (36.8 C) BP: 117/73 Pulse Rate: 74 SpO2: 97 %   Anthropometric Measurements Height: 5\' 2"  (1.575 m) Weight: 243 lb (110.2 kg) BMI (Calculated): 44.43 Weight at Last Visit: 244 lb Weight Lost Since Last Visit: 1 lb Weight Gained Since Last Visit: 0 Starting Weight: 256 lb Total Weight Loss (lbs): 13 lb (5.897 kg) Peak Weight: 268 lb   Body Composition  Body Fat %: 52.4 % Fat Mass (lbs): 127.4 lbs Muscle Mass (lbs): 109.8 lbs Total Body Water (lbs): 81.8 lbs Visceral Fat Rating : 18   Other Clinical Data Fasting: yes Labs: no Today's Visit #: 21 Starting Date: 11/18/19    Chief Complaint:   OBESITY Melissa [name] is here to discuss her progress with her obesity treatment plan. She is on the the Category 3 Plan and states she is following her eating plan approximately 60 % of the time.  She states she is exercising Walking/Yard Work 10 minutes 3-4 times per week.   Interim History:  Reviewed Bioimpedance results with pt: Muscle Mass: +1 lb Adipose Mass: -2.4 lbs  She is currently on Cat 3 meal plan, so is agreeable to try an alternate meal plan for more variety and better results.  She was seen by [facility] PCP OV with labs on 09/15/2023, referred to Dr. Victorino Dike for L ankle pain  Health Goals 2025: 1) new beginnings 2) reduce overall pain 3) continue with weight loss efforts  Subjective:   1. Heart disease Lipid panel (09/15/2023 9:39 AM EST) Lab Results - Lipid panel (09/15/2023 9:39 AM EST) Component Value Ref Range Test Method Analysis Time Performed At Pathologist Signature  Cholesterol, Total 166 100 - 199 mg/dL     [facility] 1    Triglycerides 112 0 - 149 mg/dL     [facility] 1    HDL 76 >39 mg/dL     [facility] 1    VLDL Cholesterol Cal 20 5 - 40 mg/dL     [facility] 1    LDL 70 0 - 99 mg/dL        Lipid panel stable Unable to obtain Wegovy therapy- not covered under insurance and out of pocket is cost  prohibitive  2. Pre-diabetes 09/15/2023 Labs with [facility]  A1c increased to 6.1 CMP, GFR >60  She has been taking Metformin 500mg - 2 tabs at dinner, instead of one tab at lunch and one tab at dinner  Assessment/Plan:   1. Heart disease (Primary) Continue  EPINEPHrine 0.3 mg/0.3 mL IJ SOAJ injection  losartan (COZAAR) 25 MG tablet  EPINEPHrine 0.3 mg/0.3 mL IJ SOAJ injection  rosuvastatin (CRESTOR) 20 MG tablet   2. Pre-diabetes Continue to take total of Metformin 1000mg /day  3. Obesity, current BMI 44.63  Melissa [name] is currently in the action stage of change. As such, her goal is to continue with weight loss efforts. She has agreed to the [employer]. (1200 cal/75g protein/day)  Exercise goals: All adults should avoid inactivity. Some physical activity is better than none, and adults who participate in any amount of physical activity gain some health benefits. Adults should also include muscle-strengthening activities that involve all major muscle groups on 2 or more days a week.  Behavioral modification strategies: increasing lean protein intake, decreasing simple carbohydrates, increasing vegetables, increasing water intake, decreasing liquid calories, meal planning and cooking strategies, keeping healthy foods in the home, ways to avoid boredom eating, ways to avoid night time snacking, travel eating strategies, holiday  eating strategies , and planning for success.  Melissa [name] has agreed to follow-up with our clinic in 4 weeks. She was informed of the importance of frequent follow-up visits to maximize her success with intensive lifestyle modifications for her multiple health conditions.   Objective:   Blood pressure 117/73, pulse 74, temperature 98.3 F (36.8 C), height 5\' 2"  (1.575 m), weight 243 lb (110.2 kg), last menstrual period 10/12/2012, SpO2 97%. Body mass index is 44.45 kg/m.  General: Cooperative, alert, well developed, in no acute distress. HEENT: Conjunctivae and  lids unremarkable. Cardiovascular: Regular rhythm.  Lungs: Normal work of breathing. Neurologic: No focal deficits.   Lab Results  Component Value Date   CREATININE 0.94 07/13/2023   BUN 19 07/13/2023   NA 141 07/13/2023   K 4.3 07/13/2023   CL 101 07/13/2023   CO2 20 07/13/2023   Lab Results  Component Value Date   ALT 17 07/13/2023   AST 17 07/13/2023   ALKPHOS 117 07/13/2023   BILITOT <0.2 07/13/2023   Lab Results  Component Value Date   HGBA1C 5.9 (H) 07/13/2023   HGBA1C 5.8 (H) 01/04/2023   HGBA1C 5.7 (H) 05/05/2022   HGBA1C 5.7 (H) 01/11/2022   HGBA1C 5.6 12/01/2021   Lab Results  Component Value Date   INSULIN 14.1 07/13/2023   INSULIN 14.0 01/04/2023   INSULIN 10.6 05/05/2022   INSULIN 8.3 01/11/2022   INSULIN 12.5 12/01/2021   Lab Results  Component Value Date   TSH 1.580 12/01/2021   Lab Results  Component Value Date   CHOL 207 (H) 01/11/2022   HDL 86 01/11/2022   LDLCALC 109 (H) 01/11/2022   TRIG 69 01/11/2022   CHOLHDL 2.4 01/11/2022   Lab Results  Component Value Date   VD25OH 47.9 07/13/2023   VD25OH 57.1 01/04/2023   VD25OH 61.5 05/05/2022   Lab Results  Component Value Date   WBC 8.2 02/03/2023   HGB 12.7 02/03/2023   HCT 41.0 02/03/2023   MCV 87.2 02/03/2023   PLT 354 02/03/2023   No results found for: "IRON", "TIBC", "FERRITIN"  Attestation Statements:   Reviewed by clinician on day of visit: allergies, medications, problem list, medical history, surgical history, family history, social history, and previous encounter notes.  I have reviewed the above documentation for accuracy and completeness, and I agree with the above. -  Taner Rzepka d.Tiwatope Emmitt, NP-C

## 2023-10-11 DIAGNOSIS — M25572 Pain in left ankle and joints of left foot: Secondary | ICD-10-CM | POA: Diagnosis not present

## 2023-11-08 ENCOUNTER — Encounter (INDEPENDENT_AMBULATORY_CARE_PROVIDER_SITE_OTHER): Payer: Self-pay | Admitting: Adult Health

## 2023-11-09 ENCOUNTER — Ambulatory Visit (INDEPENDENT_AMBULATORY_CARE_PROVIDER_SITE_OTHER): Payer: Medicare HMO | Admitting: Adult Health

## 2023-11-09 ENCOUNTER — Encounter (INDEPENDENT_AMBULATORY_CARE_PROVIDER_SITE_OTHER): Payer: Self-pay | Admitting: Adult Health

## 2023-11-09 ENCOUNTER — Telehealth (INDEPENDENT_AMBULATORY_CARE_PROVIDER_SITE_OTHER): Payer: Self-pay

## 2023-11-09 VITALS — BP 123/76 | HR 78 | Temp 97.5°F | Ht 62.0 in | Wt 244.0 lb

## 2023-11-09 DIAGNOSIS — E66813 Obesity, class 3: Secondary | ICD-10-CM

## 2023-11-09 DIAGNOSIS — I519 Heart disease, unspecified: Secondary | ICD-10-CM | POA: Diagnosis not present

## 2023-11-09 DIAGNOSIS — R413 Other amnesia: Secondary | ICD-10-CM | POA: Diagnosis not present

## 2023-11-09 DIAGNOSIS — R7303 Prediabetes: Secondary | ICD-10-CM

## 2023-11-09 DIAGNOSIS — E669 Obesity, unspecified: Secondary | ICD-10-CM

## 2023-11-09 DIAGNOSIS — G4733 Obstructive sleep apnea (adult) (pediatric): Secondary | ICD-10-CM | POA: Diagnosis not present

## 2023-11-09 DIAGNOSIS — Z6841 Body Mass Index (BMI) 40.0 and over, adult: Secondary | ICD-10-CM

## 2023-11-09 MED ORDER — ZEPBOUND 2.5 MG/0.5ML ~~LOC~~ SOAJ
2.5000 mg | SUBCUTANEOUS | 0 refills | Status: DC
Start: 2023-11-09 — End: 2023-12-11

## 2023-11-09 NOTE — Progress Notes (Signed)
WEIGHT SUMMARY AND BIOMETRICS  Vitals Temp: (!) 97.5 F (36.4 C) BP: 123/76 Pulse Rate: 78 SpO2: 97 %   Anthropometric Measurements Height: 5\' 2"  (1.575 m) Weight: 244 lb (110.7 kg) BMI (Calculated): 44.62 Weight at Last Visit: 243lb Weight Lost Since Last Visit: 0 Weight Gained Since Last Visit: 1lb Starting Weight: 256lb Total Weight Loss (lbs): 12 lb (5.443 kg) Peak Weight: 268lb   Body Composition  Body Fat %: 52.8 % Fat Mass (lbs): 129 lbs Muscle Mass (lbs): 109.6 lbs Total Body Water (lbs): 83.2 lbs Visceral Fat Rating : 18   Other Clinical Data Fasting: no Labs: no Today's Visit #: 26 Starting Date: 11/18/19    Chief Complaint:   OBESITY Melissa [name] is here to discuss her progress with her obesity treatment plan. She is on the the Category 3 Plan and states she is following her eating plan approximately 80 % of the time.  She states she is exercising Walking 10-15 minutes 5-7 times per week.   Interim History:  Since last OV at [facility] on 10/10/2023 She has increased seafood, vegetable intake. Dramatic decrease with "junk food" intake (ie: cookies, chips, ice cream).  She is under care of [facility], re: Left ankle pain She has completed outpatient therapy She is performing home exercises  Subjective:   1. OSA (obstructive sleep apnea) Encounter Details Reason for Visit - Consultation (Routine) - Closed Specialty Diagnoses / Procedures Referred By Contact Referred To Contact  Pulmonology Diagnoses  Obesity, Class III, BMI 40-49.9 (morbid obesity) (*)  Sleep-disordered breathing   Bowen, Scot Jun, DO  [address]  [address]  [place], [place] [address]  Phone: [phone]  Fax: [phone]  Randolm Idol, MD [facility]  [address]  [address]    Date Type Department Care Team (Latest Contact Info) Description  08/05/2014 8:30 AM EDT Office Visit [facility]  [address]  [place],  [place] [address]  [phone]  Randolm Idol, MD [facility]  [address]  [place], [place] [address]  [phone] (Work)  [phone] (Fax)  Obstructive sleep apnea syndrome, mild (Primary Dx); Obesity, unspecified obesity severity, unspecified obesity type; Hypersomnia; Snoring   Assessment   1. Obstructive sleep apnea syndrome, mild  2. Obesity, unspecified obesity severity, unspecified obesity type  3. Hypersomnia  4. Snoring   Attempted Wegovy therapy 2024, re: Heart Disease, Obesity, HLD  Discussed Zepbound therapy, re: OSA, Obesity She denies family hx of MENS 2 or MTC She denies personal hx of pancreatitis She is post menopausal   2. Memory loss Ms. Palladino states my memory " is okay". She denies getting lost or inability to perform ADLs and IADLs She has established with Neurologist  3. Heart disease She is on  EPINEPHrine 0.3 mg/0.3 mL IJ SOAJ injection  losartan (COZAAR) 25 MG tablet  EPINEPHrine 0.3 mg/0.3 mL IJ SOAJ injection  rosuvastatin (CRESTOR) 20 MG tablet   4. Pre-diabetes Lab Results  Component Value Date   HGBA1C 5.9 (H) 07/13/2023   HGBA1C 5.8 (H) 01/04/2023   HGBA1C 5.7 (H) 05/05/2022   She is on metformin 500mg  BID with meals- denies GI upset Attempted Wegovy therapy 2024, re: Heart Disease, Obesity, HLD  Discussed Zepbound therapy, re: OSA, Obesity She denies family hx of MENS 2 or MTC She denies personal hx of pancreatitis She is post menopausal   Assessment/Plan:   1. OSA (obstructive sleep apnea) Start tirzepatide (ZEPBOUND) 2.5 MG/0.5ML  Pen Inject 2.5 mg into the skin once a week. Dispense: 3 mL, Refills: 0 ordered   2. Memory loss (Primary) Utilize Memory games  3. Heart disease Continue  EPINEPHrine 0.3 mg/0.3 mL IJ SOAJ injection  losartan (COZAAR) 25 MG tablet  EPINEPHrine 0.3 mg/0.3 mL IJ SOAJ injection  rosuvastatin (CRESTOR) 20 MG tablet   4. Pre-diabetes Continue Metformin and start Zepbound  therapy  5. Obesity, current BMI 44.62 Start tirzepatide (ZEPBOUND) 2.5 MG/0.5ML Pen Inject 2.5 mg into the skin once a week. Dispense: 3 mL, Refills: 0 ordered   Meadow is currently in the action stage of change. As such, her goal is to continue with weight loss efforts. She has agreed to the Category 3 Plan.   Exercise goals: All adults should avoid inactivity. Some physical activity is better than none, and adults who participate in any amount of physical activity gain some health benefits. Adults should also include muscle-strengthening activities that involve all major muscle groups on 2 or more days a week.  Behavioral modification strategies: increasing lean protein intake, decreasing simple carbohydrates, increasing vegetables, increasing water intake, keeping healthy foods in the home, ways to avoid boredom eating, ways to avoid night time snacking, and planning for success.  Darlynn has agreed to follow-up with our clinic in 4 weeks. She was informed of the importance of frequent follow-up visits to maximize her success with intensive lifestyle modifications for her multiple health conditions.   Check Fasting Labs at next OV  Objective:   Blood pressure 123/76, pulse 78, temperature (!) 97.5 F (36.4 C), height 5\' 2"  (1.575 m), weight 244 lb (110.7 kg), last menstrual period 10/12/2012, SpO2 97%. Body mass index is 44.63 kg/m.  General: Cooperative, alert, well developed, in no acute distress. HEENT: Conjunctivae and lids unremarkable. Cardiovascular: Regular rhythm.  Lungs: Normal work of breathing. Neurologic: No focal deficits.   Lab Results  Component Value Date   CREATININE 0.94 07/13/2023   BUN 19 07/13/2023   NA 141 07/13/2023   K 4.3 07/13/2023   CL 101 07/13/2023   CO2 20 07/13/2023   Lab Results  Component Value Date   ALT 17 07/13/2023   AST 17 07/13/2023   ALKPHOS 117 07/13/2023   BILITOT <0.2 07/13/2023   Lab Results  Component Value Date   HGBA1C  5.9 (H) 07/13/2023   HGBA1C 5.8 (H) 01/04/2023   HGBA1C 5.7 (H) 05/05/2022   HGBA1C 5.7 (H) 01/11/2022   HGBA1C 5.6 12/01/2021   Lab Results  Component Value Date   INSULIN 14.1 07/13/2023   INSULIN 14.0 01/04/2023   INSULIN 10.6 05/05/2022   INSULIN 8.3 01/11/2022   INSULIN 12.5 12/01/2021   Lab Results  Component Value Date   TSH 1.580 12/01/2021   Lab Results  Component Value Date   CHOL 207 (H) 01/11/2022   HDL 86 01/11/2022   LDLCALC 109 (H) 01/11/2022   TRIG 69 01/11/2022   CHOLHDL 2.4 01/11/2022   Lab Results  Component Value Date   VD25OH 47.9 07/13/2023   VD25OH 57.1 01/04/2023   VD25OH 61.5 05/05/2022   Lab Results  Component Value Date   WBC 8.2 02/03/2023   HGB 12.7 02/03/2023   HCT 41.0 02/03/2023   MCV 87.2 02/03/2023   PLT 354 02/03/2023   No results found for: "IRON", "TIBC", "FERRITIN"  Attestation Statements:   Reviewed by clinician on day of visit: allergies, medications, problem list, medical history, surgical history, family history, social history, and previous encounter notes.  I  have reviewed the above documentation for accuracy and completeness, and I agree with the above. -  Othon Guardia d. Dorsie Burich, NP-C

## 2023-11-09 NOTE — Telephone Encounter (Signed)
PA for Zepbound 2.5 has been submitted, awaiting PA questions.

## 2023-11-13 NOTE — Telephone Encounter (Signed)
PA for Zepbound 2.5 has been approved. PA is now complete.     Outcome Approved on January 16 by [facility] Medicare NCPDP 2017 Your request has been approved Authorization Expiration Date: 10/23/2024

## 2023-12-11 ENCOUNTER — Ambulatory Visit (INDEPENDENT_AMBULATORY_CARE_PROVIDER_SITE_OTHER): Payer: Medicare HMO | Admitting: Adult Health

## 2023-12-11 ENCOUNTER — Other Ambulatory Visit (INDEPENDENT_AMBULATORY_CARE_PROVIDER_SITE_OTHER): Payer: Self-pay | Admitting: Adult Health

## 2023-12-11 ENCOUNTER — Encounter (INDEPENDENT_AMBULATORY_CARE_PROVIDER_SITE_OTHER): Payer: Self-pay | Admitting: Adult Health

## 2023-12-11 VITALS — BP 112/67 | HR 83 | Temp 98.3°F | Ht 62.0 in | Wt 232.0 lb

## 2023-12-11 DIAGNOSIS — Z6841 Body Mass Index (BMI) 40.0 and over, adult: Secondary | ICD-10-CM

## 2023-12-11 DIAGNOSIS — R7303 Prediabetes: Secondary | ICD-10-CM | POA: Diagnosis not present

## 2023-12-11 DIAGNOSIS — G4733 Obstructive sleep apnea (adult) (pediatric): Secondary | ICD-10-CM

## 2023-12-11 DIAGNOSIS — I10 Essential (primary) hypertension: Secondary | ICD-10-CM

## 2023-12-11 DIAGNOSIS — E559 Vitamin D deficiency, unspecified: Secondary | ICD-10-CM

## 2023-12-11 DIAGNOSIS — E669 Obesity, unspecified: Secondary | ICD-10-CM

## 2023-12-11 MED ORDER — ZEPBOUND 5 MG/0.5ML ~~LOC~~ SOAJ: 5.0000 mg | SUBCUTANEOUS | 0 refills | Status: DC

## 2023-12-11 NOTE — Progress Notes (Signed)
WEIGHT SUMMARY AND BIOMETRICS  Vitals Temp: 98.3 F (36.8 C) BP: 112/67 Pulse Rate: 83 SpO2: 100 %   Anthropometric Measurements Height: 5\' 2"  (1.575 m) Weight: 232 lb (105.2 kg) BMI (Calculated): 42.42 Weight at Last Visit: 244 lb Weight Lost Since Last Visit: 12 lb Weight Gained Since Last Visit: 0 Starting Weight: 256 lb Total Weight Loss (lbs): 24 lb (10.9 kg) Peak Weight: 268 lb   Body Composition  Body Fat %: 51.9 % Fat Mass (lbs): 120.8 lbs Muscle Mass (lbs): 106.2 lbs Total Body Water (lbs): 80.2 lbs Visceral Fat Rating : 17   Other Clinical Data Fasting: yes Labs: yes Today's Visit #: 73 Starting Date: 11/18/19    Chief Complaint:   OBESITY Melissa [name] is here to discuss her progress with her obesity treatment plan.  She is on the the Category 3 Plan and states she is following her eating plan approximately 75 % of the time.  She states she is exercising Walking 15 minutes 7 times per week.   Interim History: She was recently started on weekly Zepbound 2.5mg  on/about 11/09/2023 She has had 4 doses Denies mass in neck, dysphagia, dyspepsia, persistent hoarseness, abdominal pain, or N/V/worsening constipation.  Reviewed Bioimpedance results: Muscle Mass: -3.4 lbs Adipose Mass: -8.2 lbs  Subjective:   1. OSA (obstructive sleep apnea) She estimates to have last used CPAP > 18 months She completed sleep study in [facility]  [address]  [place], [place] [address]  [phone]   She was recently started on weekly Zepbound 2.5mg  on/about 11/09/2023 She has had 4 doses Denies mass in neck, dysphagia, dyspepsia, persistent hoarseness, abdominal pain, or N/V/worsening constipation.  2. Pre-diabetes Lab Results  Component Value Date   HGBA1C 5.9 (H) 07/13/2023   HGBA1C 5.8 (H) 01/04/2023   HGBA1C 5.7 (H) 05/05/2022   She is on Metformin 500mg - 2 tabs at dinner. Recently started weekly Zepbound  2.5mg  She endorses stable appetite   3. Hypertension, unspecified type BP at goal at OV She denies CP with exertion Denies mass in neck, dysphagia, dyspepsia, persistent hoarseness, abdominal pain, or N/V/worsening constipation.  4. Vitamin D deficiency  Latest Reference Range & Units 07/13/23 11:31  Vitamin D, 25-Hydroxy 30.0 - 100.0 ng/mL 47.9   She is on daily OTC Vit D 3 1,000 international units   Assessment/Plan:   1. OSA (obstructive sleep apnea) (Primary) Refill and INCREASE - tirzepatide (ZEPBOUND) 5 MG/0.5ML Pen; Inject 5 mg into the skin once a week.  Dispense: 6 mL; Refill: 0  2. Pre-diabetes Check Labs - Hemoglobin A1c - Insulin, random - Vitamin B12 Continue Metformin therapy Continue to take total of Metformin 1000mg /day   3. Hypertension, unspecified type Check Labs - Comprehensive metabolic panel  4. Vitamin D deficiency Check Labs - VITAMIN D 25 Hydroxy (Vit-D Deficiency, Fractures)  5. Obesity, current BMI 42.5 Refill and INCREASE - tirzepatide (ZEPBOUND) 5 MG/0.5ML Pen; Inject 5 mg into the skin once a week.  Dispense: 6 mL; Refill: 0  Melissa [name] is currently in the action stage of change. As such, her goal is to continue with weight loss efforts. She has agreed to the Category 3 Plan.   Exercise goals: All adults should avoid inactivity. Some physical activity is better than none, and adults who participate in any amount of physical activity gain some health benefits. Adults should also include muscle-strengthening activities that involve all major muscle groups on 2 or more days a week.  Behavioral modification strategies: increasing lean protein intake, decreasing simple carbohydrates, increasing vegetables, increasing water intake, no skipping meals, meal planning and cooking strategies, keeping healthy foods in the home, ways to avoid boredom eating, and planning for success.  Melissa [name] has agreed to follow-up with our clinic in 4 weeks. She was  informed of the importance of frequent follow-up visits to maximize her success with intensive lifestyle modifications for her multiple health conditions.   Melissa [name] was informed we would discuss her lab results at her next visit unless there is a critical issue that needs to be addressed sooner. Melissa [name] agreed to keep her next visit at the agreed upon time to discuss these results.  Objective:   Blood pressure 112/67, pulse 83, temperature 98.3 F (36.8 C), height 5\' 2"  (1.575 m), weight 232 lb (105.2 kg), last menstrual period 10/12/2012, SpO2 100%. Body mass index is 42.43 kg/m.  General: Cooperative, alert, well developed, in no acute distress. HEENT: Conjunctivae and lids unremarkable. Cardiovascular: Regular rhythm.  Lungs: Normal work of breathing. Neurologic: No focal deficits.   Lab Results  Component Value Date   CREATININE 0.94 07/13/2023   BUN 19 07/13/2023   NA 141 07/13/2023   K 4.3 07/13/2023   CL 101 07/13/2023   CO2 20 07/13/2023   Lab Results  Component Value Date   ALT 17 07/13/2023   AST 17 07/13/2023   ALKPHOS 117 07/13/2023   BILITOT <0.2 07/13/2023   Lab Results  Component Value Date   HGBA1C 5.9 (H) 07/13/2023   HGBA1C 5.8 (H) 01/04/2023   HGBA1C 5.7 (H) 05/05/2022   HGBA1C 5.7 (H) 01/11/2022   HGBA1C 5.6 12/01/2021   Lab Results  Component Value Date   INSULIN 14.1 07/13/2023   INSULIN 14.0 01/04/2023   INSULIN 10.6 05/05/2022   INSULIN 8.3 01/11/2022   INSULIN 12.5 12/01/2021   Lab Results  Component Value Date   TSH 1.580 12/01/2021   Lab Results  Component Value Date   CHOL 207 (H) 01/11/2022   HDL 86 01/11/2022   LDLCALC 109 (H) 01/11/2022   TRIG 69 01/11/2022   CHOLHDL 2.4 01/11/2022   Lab Results  Component Value Date   VD25OH 47.9 07/13/2023   VD25OH 57.1 01/04/2023   VD25OH 61.5 05/05/2022   Lab Results  Component Value Date   WBC 8.2 02/03/2023   HGB 12.7 02/03/2023   HCT 41.0 02/03/2023   MCV 87.2 02/03/2023    PLT 354 02/03/2023   No results found for: "IRON", "TIBC", "FERRITIN"  Attestation Statements:   Reviewed by clinician on day of visit: allergies, medications, problem list, medical history, surgical history, family history, social history, and previous encounter notes.  I have reviewed the above documentation for accuracy and completeness, and I agree with the above. -  Aanya Haynes d. Sherley Leser, NP-C

## 2023-12-12 LAB — HEMOGLOBIN A1C
Est. average glucose Bld gHb Est-mCnc: 111 mg/dL
Hgb A1c MFr Bld: 5.5 % (ref 4.8–5.6)

## 2023-12-12 LAB — COMPREHENSIVE METABOLIC PANEL
ALT: 12 [IU]/L (ref 0–32)
AST: 13 [IU]/L (ref 0–40)
Albumin: 4.6 g/dL (ref 3.8–4.9)
Alkaline Phosphatase: 112 [IU]/L (ref 44–121)
BUN/Creatinine Ratio: 19 (ref 12–28)
BUN: 15 mg/dL (ref 8–27)
Bilirubin Total: 0.2 mg/dL (ref 0.0–1.2)
CO2: 24 mmol/L (ref 20–29)
Calcium: 9.8 mg/dL (ref 8.7–10.3)
Chloride: 102 mmol/L (ref 96–106)
Creatinine, Ser: 0.79 mg/dL (ref 0.57–1.00)
Globulin, Total: 2.6 g/dL (ref 1.5–4.5)
Glucose: 84 mg/dL (ref 70–99)
Potassium: 5.1 mmol/L (ref 3.5–5.2)
Sodium: 143 mmol/L (ref 134–144)
Total Protein: 7.2 g/dL (ref 6.0–8.5)
eGFR: 86 mL/min/{1.73_m2} (ref >59)

## 2023-12-12 LAB — VITAMIN D 25 HYDROXY (VIT D DEFICIENCY, FRACTURES): Vit D, 25-Hydroxy: 66.5 ng/mL (ref 30.0–100.0)

## 2023-12-12 LAB — INSULIN, RANDOM: INSULIN: 9.2 u[IU]/mL (ref 2.6–24.9)

## 2023-12-12 LAB — VITAMIN B12: Vitamin B-12: 453 pg/mL (ref 232–1245)

## 2024-01-08 ENCOUNTER — Encounter (INDEPENDENT_AMBULATORY_CARE_PROVIDER_SITE_OTHER): Payer: Self-pay | Admitting: Adult Health

## 2024-01-08 ENCOUNTER — Ambulatory Visit (INDEPENDENT_AMBULATORY_CARE_PROVIDER_SITE_OTHER): Payer: Medicare HMO | Admitting: Adult Health

## 2024-01-08 VITALS — BP 120/69 | HR 87 | Temp 98.5°F | Ht 62.0 in | Wt 227.0 lb

## 2024-01-08 DIAGNOSIS — E669 Obesity, unspecified: Secondary | ICD-10-CM

## 2024-01-08 DIAGNOSIS — G4733 Obstructive sleep apnea (adult) (pediatric): Secondary | ICD-10-CM

## 2024-01-08 DIAGNOSIS — R7303 Prediabetes: Secondary | ICD-10-CM

## 2024-01-08 DIAGNOSIS — E66813 Obesity, class 3: Secondary | ICD-10-CM

## 2024-01-08 DIAGNOSIS — Z6841 Body Mass Index (BMI) 40.0 and over, adult: Secondary | ICD-10-CM

## 2024-01-08 DIAGNOSIS — I10 Essential (primary) hypertension: Secondary | ICD-10-CM

## 2024-01-08 DIAGNOSIS — E559 Vitamin D deficiency, unspecified: Secondary | ICD-10-CM

## 2024-01-08 DIAGNOSIS — F330 Major depressive disorder, recurrent, mild: Secondary | ICD-10-CM

## 2024-01-08 MED ORDER — FAMOTIDINE 20 MG PO TABS: 20.0000 mg | ORAL_TABLET | Freq: Two times a day (BID) | ORAL | 3 refills | Status: DC

## 2024-01-08 MED ORDER — METFORMIN HCL 500 MG PO TABS
ORAL_TABLET | ORAL | 0 refills | Status: DC
Start: 2024-01-08 — End: 2024-02-20

## 2024-01-08 NOTE — Progress Notes (Signed)
 WEIGHT SUMMARY AND BIOMETRICS  Vitals Temp: 98.5 F (36.9 C) BP: 120/69 Pulse Rate: 87 SpO2: 98 %   Anthropometric Measurements Height: 5\' 2"  (1.575 m) Weight: 227 lb (103 kg) BMI (Calculated): 41.51 Weight at Last Visit: 232 lb Weight Lost Since Last Visit: 5 lb Weight Gained Since Last Visit: 0 lb Starting Weight: 256 lb Total Weight Loss (lbs): 29 lb (13.2 kg) Peak Weight: 268 lb   Body Composition  Body Fat %: 51.7 % Fat Mass (lbs): 117.4 lbs Muscle Mass (lbs): 104 lbs Total Body Water (lbs): 81.4 lbs Visceral Fat Rating : 17   Other Clinical Data Fasting: no Labs: no Today's Visit #: 66 Starting Date: 11/18/19    Chief Complaint:   OBESITY Melissa [name] is here to discuss her progress with her obesity treatment plan.  She is on the the Category 3 Plan and states she is following her eating plan approximately 80 % of the time.  She states she is exercising Walking 10-15 minutes 7 times per week.   Interim History:   She was recently started on weekly Zepbound 2.5mg  on/about 11/09/2023  12/11/2023 Zepbpund increased from 2.5mg  to 5mg  once weekly injection She has 3 month supply at home  Denies mass in neck, dysphagia, persistent hoarseness, abdominal pain, or N/V/C  She endorses increased acid reflux sx's She is on daily OTC Prilosec 40mg  She has not been using OTC Pepcid 20mg  BID- agreeable to restarting    Subjective:   1. Hypertension, unspecified type BP at goal at OV She reports walking daily and tolerating exercise well. 12/11/2023 CMP: Electrolytes, kidney fx, liver enzymes- stable  3. OSA (obstructive sleep apnea) Discussed Labs Recent fasting complete and reviewed in detail today  4. Vitamin D deficiency Discussed Labs  Latest Reference Range & Units 07/13/23 11:31 12/11/23 11:03  Vitamin D, 25-Hydroxy 30.0 - 100.0 ng/mL 47.9 66.5   Vit D Level at goal 50-70 She is on OTC Vit D 2 1,000 international units  2 caps a day= 2,0000  international units   5. Pre-diabetes Discussed Labs She was recently started on weekly Zepbound 2.5mg  on/about 11/09/2023 Zepbound increased from 2.5mg  to 5mg  on/about 12/11/2023  Denies mass in neck, dysphagia, dyspepsia, persistent hoarseness, abdominal pain, or N/V/worsening constipation.   Latest Reference Range & Units 07/13/23 11:31 12/11/23 11:03  INSULIN 2.6 - 24.9 uIU/mL 14.1 9.2    Latest Reference Range & Units 12/11/23 11:03  Glucose 70 - 99 mg/dL 84  Hemoglobin W0J 4.8 - 5.6 % 5.5  Est. average glucose Bld gHb Est-mCnc mg/dL 811  INSULIN 2.6 - 91.4 uIU/mL 9.2   CBG and A1c improved and both at goal Insulin level improved and almost at goal <5  2. 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 Hypertension, unspecified type (Primary) Continue with weight loss efforts Continue daily Losartan 25mg   2. OSA (obstructive sleep apnea) Continue with weight loss efforts Continue weekly Zebpound therapy  3. Vitamin D deficiency Continue OTC Vit D 2 1,000 international units  2 caps a day= 2,0000 international units   4. Pre-diabetes Refill - metFORMIN (GLUCOPHAGE) 500 MG tablet; 1 tab at lunch 1 tab at dinner  Dispense: 180 tablet; Refill: 0  5. Major depressive disorder, recurrent episode, mild with  anxious distress (HCC) Complete paperwork for Emotional Support Animal I would be happy to assist with paperwork needed- please bring in forms to next OV  6. Obesity, current BMI 41.5 Do not overeat Do not eat too close to bedtime Avoid known trigger foods ReStart famotidine (PEPCID) 20 MG tablet Take 1 tablet (20 mg total) by mouth 2 (two) times daily. Dispense: 30 tablet, Refills: 3 ordered   Melissa [name] is currently in the  action stage of change. As such, her goal is to continue with weight loss efforts. She has agreed to the Category 3 Plan.   Exercise goals: For substantial health benefits, adults should do at least 150 minutes (2 hours and 30 minutes) a week of moderate-intensity, or 75 minutes (1 hour and 15 minutes) a week of vigorous-intensity aerobic physical activity, or an equivalent combination of moderate- and vigorous-intensity aerobic activity. Aerobic activity should be performed in episodes of at least 10 minutes, and preferably, it should be spread throughout the week.  Behavioral modification strategies: increasing lean protein intake, decreasing simple carbohydrates, increasing vegetables, increasing water intake, no skipping meals, meal planning and cooking strategies, keeping healthy foods in the home, and planning for success.  Melissa [name] has agreed to follow-up with our clinic in 4 weeks. She was informed of the importance of frequent follow-up visits to maximize her success with intensive lifestyle modifications for her multiple health conditions.   Objective:   Blood pressure 120/69, pulse 87, temperature 98.5 F (36.9 C), height 5\' 2"  (1.575 m), weight 227 lb (103 kg), last menstrual period 10/12/2012, SpO2 98%. Body mass index is 41.52 kg/m.  General: Cooperative, alert, well developed, in no acute distress. HEENT: Conjunctivae and lids unremarkable. Cardiovascular: Regular rhythm.  Lungs: Normal work of breathing. Neurologic: No focal deficits.   Lab Results  Component Value Date   CREATININE 0.79 12/11/2023   BUN 15 12/11/2023   NA 143 12/11/2023   K 5.1 12/11/2023   CL 102 12/11/2023   CO2 24 12/11/2023   Lab Results  Component Value Date   ALT 12 12/11/2023   AST 13 12/11/2023   ALKPHOS 112 12/11/2023   BILITOT 0.2 12/11/2023   Lab Results  Component Value Date   HGBA1C 5.5 12/11/2023   HGBA1C 5.9 (H) 07/13/2023   HGBA1C 5.8 (H) 01/04/2023   HGBA1C 5.7 (H) 05/05/2022    HGBA1C 5.7 (H) 01/11/2022   Lab Results  Component Value Date   INSULIN 9.2 12/11/2023   INSULIN 14.1 07/13/2023   INSULIN 14.0 01/04/2023   INSULIN 10.6 05/05/2022   INSULIN 8.3 01/11/2022   Lab Results  Component Value Date   TSH 1.580 12/01/2021   Lab Results  Component Value Date   CHOL 207 (H) 01/11/2022   HDL 86 01/11/2022   LDLCALC 109 (H) 01/11/2022   TRIG 69 01/11/2022   CHOLHDL 2.4 01/11/2022   Lab Results  Component Value Date   VD25OH 66.5 12/11/2023   VD25OH 47.9 07/13/2023   VD25OH 57.1 01/04/2023   Lab Results  Component Value Date   WBC 8.2 02/03/2023   HGB 12.7 02/03/2023   HCT 41.0 02/03/2023   MCV 87.2 02/03/2023   PLT 354 02/03/2023   No results found for: "IRON", "TIBC", "FERRITIN"  Attestation Statements:   Reviewed by clinician on day of visit: allergies, medications, problem list, medical history, surgical history, family history, social history, and previous encounter notes.  I have reviewed the above documentation for accuracy and completeness, and I agree with the above. -  Latima Hamza d. Cipriano Millikan, NP-C

## 2024-02-20 ENCOUNTER — Encounter (INDEPENDENT_AMBULATORY_CARE_PROVIDER_SITE_OTHER): Payer: Self-pay | Admitting: Adult Health

## 2024-02-20 ENCOUNTER — Ambulatory Visit (INDEPENDENT_AMBULATORY_CARE_PROVIDER_SITE_OTHER): Admitting: Adult Health

## 2024-02-20 VITALS — BP 124/69 | HR 85 | Temp 98.2°F | Ht 62.0 in | Wt 218.0 lb

## 2024-02-20 DIAGNOSIS — Z6841 Body Mass Index (BMI) 40.0 and over, adult: Secondary | ICD-10-CM

## 2024-02-20 DIAGNOSIS — G4733 Obstructive sleep apnea (adult) (pediatric): Secondary | ICD-10-CM

## 2024-02-20 DIAGNOSIS — R7303 Prediabetes: Secondary | ICD-10-CM

## 2024-02-20 DIAGNOSIS — E669 Obesity, unspecified: Secondary | ICD-10-CM

## 2024-02-20 DIAGNOSIS — E559 Vitamin D deficiency, unspecified: Secondary | ICD-10-CM | POA: Diagnosis not present

## 2024-02-20 DIAGNOSIS — Z91030 Bee allergy status: Secondary | ICD-10-CM

## 2024-02-20 MED ORDER — EPINEPHRINE 0.3 MG/0.3ML IJ SOAJ: 0.3000 mg | INTRAMUSCULAR | 0 refills | Status: AC | PRN

## 2024-02-20 MED ORDER — ZEPBOUND 7.5 MG/0.5ML ~~LOC~~ SOAJ: 7.5000 mg | SUBCUTANEOUS | 0 refills | Status: DC

## 2024-02-20 MED ORDER — METFORMIN HCL 500 MG PO TABS
ORAL_TABLET | ORAL | 0 refills | Status: DC
Start: 2024-02-20 — End: 2024-06-18

## 2024-02-20 NOTE — Progress Notes (Signed)
 WEIGHT SUMMARY AND BIOMETRICS  Vitals Temp: 98.2 F (36.8 C) BP: 124/69 Pulse Rate: 85 SpO2: 96 %   Anthropometric Measurements Height: 5\' 2"  (1.575 m) Weight: 218 lb (98.9 kg) BMI (Calculated): 39.86 Weight at Last Visit: 227 lb Weight Lost Since Last Visit: 9 lb Weight Gained Since Last Visit: 0 Starting Weight: 256 lb Total Weight Loss (lbs): 38 lb (17.2 kg) Peak Weight: 268 lb   Body Composition  Body Fat %: 50.2 % Fat Mass (lbs): 109.8 lbs Muscle Mass (lbs): 103.4 lbs Total Body Water  (lbs): 78.8 lbs Visceral Fat Rating : 16   Other Clinical Data Fasting: no Labs: no Today's Visit #: 41 Starting Date: 11/18/19    Chief Complaint:   OBESITY Melissa [name] is here to discuss her progress with her obesity treatment plan.  She is on the the Category 3 Plan and states she is following her eating plan approximately 70 % of the time.  She states she is exercising Walking 10-15 minutes 7 times per week.   Interim History:  One her dogs passed away approximately 2 weeks ago  She has her "Emotional Support" animal  in-training today, "Melissa [name]"  She was recently started on weekly Zepbound  2.5mg  on/about 11/09/2023  Starting Weight 244 lbs, 5% weight loss is 12 lbs to a weight of 231 lbs 12/11/2023 Zepbpund increased from 2.5mg  to 5mg  once weekly injection She has two more doses at home She endorses increased sweet cravings the last several weeks  Current weight 218 lbs with corresponding BMI 40.0  Of note- She is post menopausal   Subjective:   1. OSA (obstructive sleep apnea) She has not use home CPAP in years She denies excessive daytime drowsiness  2. Pre-diabetes Lab Results  Component Value Date   HGBA1C 5.5 12/11/2023   HGBA1C 5.9 (H) 07/13/2023   HGBA1C 5.8 (H) 01/04/2023    She is on Metformin  500mg  BID with meals and weekly Zepbound  5mg  She endorses increased sweet cravings the last several weeks Denies mass in neck, dysphagia, dyspepsia,  persistent hoarseness, abdominal pain, or N/V/C  Of note- She is post menopausal   3. Vitamin D  deficiency  Latest Reference Range & Units 07/13/23 11:31 12/11/23 11:03  Vitamin D , 25-Hydroxy 30.0 - 100.0 ng/mL 47.9 66.5   She is on daily OTC Vit D 1,000 international units : 2 caps/day = 2,000 international units   4. Bee Sting Allergy She has significant allergy to [place] She estimates that her home Epi Pen is > 2 years expired [place]  Assessment/Plan:   1. OSA (obstructive sleep apnea) (Primary) Continue with weight loss efforts  Continue with weekly Zepbound  therapy   2. Pre-diabetes Refill metFORMIN  (GLUCOPHAGE ) 500 MG tablet 1 tab at lunch 1 tab at dinner Dispense: 180 tablet, Refills: 0 ordered   3. Vitamin D  deficiency Continue daily OTC Vit D 1,000 international units : 2 caps/day = 2,000 international units   4. Bee Sting Allergy One time Bridge Refill EPINEPHrine  0.3 mg/0.3 mL IJ SOAJ injection Inject 0.3 mg into the muscle as needed for anaphylaxis. Dispense: 1 each, Refills: 0 ordered   F/u with PCP  5. Obesity, current BMI 40.0 Refill and INCREASE tirzepatide  (ZEPBOUND ) 7.5 MG/0.5ML Pen Inject 7.5 mg into the skin once a week. Dispense: 6 mL, Refills: 0 ordered   Melissa [name] is currently in the action stage of change. As such, her goal is to continue with weight loss efforts. She  has agreed to the Category 3 Plan.   Exercise goals: All adults should avoid inactivity. Some physical activity is better than none, and adults who participate in any amount of physical activity gain some health benefits. Adults should also include muscle-strengthening activities that involve all major muscle groups on 2 or more days a week.  Behavioral modification strategies: increasing lean protein intake, decreasing simple carbohydrates, increasing vegetables, increasing water  intake, no skipping meals, meal planning and cooking strategies,  keeping healthy foods in the home, ways to avoid boredom eating, and planning for success.  Melissa [name] has agreed to follow-up with our clinic in 4 weeks. She was informed of the importance of frequent follow-up visits to maximize her success with intensive lifestyle modifications for her multiple health conditions.   Objective:   Blood pressure 124/69, pulse 85, temperature 98.2 F (36.8 C), height 5\' 2"  (1.575 m), weight 218 lb (98.9 kg), last menstrual period 10/12/2012, SpO2 96%. Body mass index is 39.87 kg/m.  General: Cooperative, alert, well developed, in no acute distress. HEENT: Conjunctivae and lids unremarkable. Cardiovascular: Regular rhythm.  Lungs: Normal work of breathing. Neurologic: No focal deficits.   Lab Results  Component Value Date   CREATININE 0.79 12/11/2023   BUN 15 12/11/2023   NA 143 12/11/2023   K 5.1 12/11/2023   CL 102 12/11/2023   CO2 24 12/11/2023   Lab Results  Component Value Date   ALT 12 12/11/2023   AST 13 12/11/2023   ALKPHOS 112 12/11/2023   BILITOT 0.2 12/11/2023   Lab Results  Component Value Date   HGBA1C 5.5 12/11/2023   HGBA1C 5.9 (H) 07/13/2023   HGBA1C 5.8 (H) 01/04/2023   HGBA1C 5.7 (H) 05/05/2022   HGBA1C 5.7 (H) 01/11/2022   Lab Results  Component Value Date   INSULIN  9.2 12/11/2023   INSULIN  14.1 07/13/2023   INSULIN  14.0 01/04/2023   INSULIN  10.6 05/05/2022   INSULIN  8.3 01/11/2022   Lab Results  Component Value Date   TSH 1.580 12/01/2021   Lab Results  Component Value Date   CHOL 207 (H) 01/11/2022   HDL 86 01/11/2022   LDLCALC 109 (H) 01/11/2022   TRIG 69 01/11/2022   CHOLHDL 2.4 01/11/2022   Lab Results  Component Value Date   VD25OH 66.5 12/11/2023   VD25OH 47.9 07/13/2023   VD25OH 57.1 01/04/2023   Lab Results  Component Value Date   WBC 8.2 02/03/2023   HGB 12.7 02/03/2023   HCT 41.0 02/03/2023   MCV 87.2 02/03/2023   PLT 354 02/03/2023   No results found for: "IRON", "TIBC",  "FERRITIN"  Attestation Statements:   Reviewed by clinician on day of visit: allergies, medications, problem list, medical history, surgical history, family history, social history, and previous encounter notes.  I have reviewed the above documentation for accuracy and completeness, and I agree with the above. -  Alexandru Moorer d. Coye Dawood, NP-C

## 2024-03-11 NOTE — Progress Notes (Deleted)
 PATIENT: Melissa [name] DOB: 16-Jan-1966  REASON FOR VISIT: follow up HISTORY FROM: patient PRIMARY NEUROLOGIST:   HISTORY OF PRESENT ILLNESS: Today   Melissa [name] is a 58 y.o. female who has been followed in this office for memory loss. Returns today for follow-up.   MRI brain w/wo contrast 06/21/23:   IMPRESSION: This MRI of the brain with and without contrast shows the following: Scattered T2/FLAIR hyperintense foci in the cerebral hemispheres and some foci in the pons in a pattern most consistent with mild to moderate chronic microvascular ischemic change, more than typical for age. Minimal generalized atrophy is likely normal for age. No acute findings.  Normal enhancement pattern.   EEG 03/30/23: Impression: This is an essentially normal EEG recording in the waking and sleeping state. The beta activity is likely a side effect from medication. No evidence of interictal epileptiform discharges. Normal EEGs, however, do not rule out epilepsy.       HISTORY Melissa [name] is a 58 y.o. female here as requested by Shepperson, Kirstin, PA* for memory loss. has S/P right UKR; Expected blood loss anemia; Obese; Acquired hypothyroidism; Osteoarthritis of right knee; Osteoarthritis of left knee; Atrophy of left kidney; Chronic back pain; Diaphragmatic hernia; DDD (degenerative disc disease), cervical; History of systemic reaction to bee sting; Hydronephrosis of right kidney; Major depression, recurrent, chronic (HCC); Neck pain; Obstructive sleep apnea syndrome, mild; Spondylolisthesis; Vitamin D  deficiency; Constipated; Hypothyroidism; Prediabetes; Lumbar radiculopathy; Other hyperlipidemia; Major depressive disorder, recurrent episode, mild with anxious distress (HCC); Hyperkalemia; Insulin  resistance; SOB (shortness of breath); Lumbar spine pain; S/P lumbar fusion; DOE (dyspnea on exertion); Allergic rhinitis; Class 3 severe obesity with serious comorbidity and body mass index (BMI) of  45.0 to 49.9 in adult [facility]); Depression; Bilateral chronic knee pain; and Status post reverse total arthroplasty of right shoulder on their problem list.   Patient is here for memory loss.  I could not find any brain imaging in epic or "Care Everywhere".  From a thorough review of records it appears as though she just had status post reverse total arthroplasty of the right shoulder with Dr. Yvonne Hering April 24 to February 16, 2023.  She had memory loss issues for 20 years and that started from a thyroid  issues. Forgets things she has to do, not good with names, relatively sttable. Lives alone, not getting lost, no accidents in the home, performing all IADLs and ADLs no problems there. She loses time. There is confusion sometimes doing things. For 20 years has not changed. Chronic pain. Disabled. Joint pain. Also major recurrent depression, anxiety. She has weakness in the legs and arms, comes and goes. Also episodes of facial numbnes and tongling.No other focal neurologic deficits, associated symptoms, inciting events or modifiable factors. Quit drinking 17 years she drank very heavily 4-6 beers a day for 20 years.       Reviewed notes, labs and imaging from outside physicians, which showed :   01/04/2023 B12 1,016, hgba1c 5.8,vit d nml. TSH 09/2022 2.280.       REVIEW OF SYSTEMS: Out of a complete 14 system review of symptoms, the patient complains only of the following symptoms, and all other reviewed systems are negative.  ALLERGIES: Allergies  Allergen Reactions   Bee Venom Hives   Lactose Intolerance (Gi)     HOME MEDICATIONS: Outpatient Medications Prior to Visit  Medication Sig Dispense Refill   Ascorbic Acid (VITAMIN C) 1000 MG tablet Take 3,000 mg by mouth at bedtime.  aspirin  81 MG chewable tablet Chew 81 mg by mouth.     BREYNA 80-4.5 MCG/ACT inhaler Inhale 2 puffs into the lungs daily as needed.     buPROPion  (WELLBUTRIN  XL) 300 MG 24 hr tablet Take 300 mg by mouth daily.      busPIRone  (BUSPAR ) 5 MG tablet Take 5 mg by mouth 2 (two) times daily as needed for anxiety.     chlorpheniramine (CHLOR-TRIMETON) 4 MG tablet Take 4 mg by mouth daily.     cholecalciferol (VITAMIN D3) 25 MCG (1000 UNIT) tablet Take 1,000 Units by mouth daily.     diphenhydrAMINE  (BENADRYL ) 25 mg capsule Take 1 capsule (25 mg total) by mouth every 6 (six) hours as needed for itching, allergies or sleep. 30 capsule    EPINEPHrine  0.3 mg/0.3 mL IJ SOAJ injection Inject 0.3 mg into the muscle as needed for anaphylaxis. 1 each 0   famotidine  (PEPCID ) 20 MG tablet Take 1 tablet (20 mg total) by mouth 2 (two) times daily. 30 tablet 3   FLUoxetine  (PROZAC ) 40 MG capsule Take 40 mg by mouth daily.     gabapentin  (NEURONTIN ) 100 MG capsule Take 1 capsule (100 mg total) by mouth 3 (three) times daily. For pain 90 capsule 0   hydrOXYzine  (ATARAX ) 25 MG tablet Take 1 tablet (25 mg total) by mouth every 6 (six) hours. 12 tablet 0   levothyroxine  (SYNTHROID ) 125 MCG tablet Take 125 mcg by mouth daily before breakfast.     losartan  (COZAAR ) 25 MG tablet Take 25 mg by mouth daily.     meclizine  (ANTIVERT ) 25 MG tablet Take 25 mg by mouth 3 (three) times daily as needed for dizziness.     metFORMIN  (GLUCOPHAGE ) 500 MG tablet 1 tab at lunch 1 tab at dinner 180 tablet 0   montelukast  (SINGULAIR ) 10 MG tablet Take 10 mg by mouth at bedtime.     mupirocin ointment (BACTROBAN) 2 % Apply 1 Application topically daily as needed (irritation).     Naphazoline-Pheniramine ([employer] EYE ALLERGY RELIEF OP) Place 1 drop into both eyes daily as needed (allergies).     omeprazole (PRILOSEC) 40 MG capsule Take 40 mg by mouth at bedtime.      rosuvastatin (CRESTOR) 20 MG tablet Take by mouth.     tirzepatide  (ZEPBOUND ) 7.5 MG/0.5ML Pen Inject 7.5 mg into the skin once a week. 6 mL 0   triamcinolone  cream (KENALOG ) 0.5 % Apply 1 Application topically daily as needed (irritation).     No facility-administered medications prior to  visit.    PAST MEDICAL HISTORY: Past Medical History:  Diagnosis Date   Allergic rhinitis    Anxiety    Arthritis    Back pain    Constipated    Depression    Diaphragmatic hernia    Fatty liver    GERD (gastroesophageal reflux disease)    History of kidney stones    Hypertension    Hypothyroidism    IBS (irritable bowel syndrome)    Joint pain    Kidney problem    one functioning kidney   Lactose intolerance    Morbid obesity (HCC)    OSA (obstructive sleep apnea)    Osteoarthritis    Pre-diabetes    Seasonal allergies    Tinnitus    Vertigo    Vitamin D  deficiency     PAST SURGICAL HISTORY: Past Surgical History:  Procedure Laterality Date   ABDOMINAL EXPOSURE N/A 05/09/2022   Procedure: ABDOMINAL EXPOSURE;  Surgeon: Fulton Job,  Marine Sia, MD;  Location: [facility] OR;  Service: Vascular;  Laterality: N/A;   CARPAL TUNNEL RELEASE     rt/lt   COLONOSCOPY     FINGER ARTHRODESIS  11/08/2011   Procedure: ARTHRODESIS FINGER;  Surgeon: Amelie Baize., MD;  Location: [facility];  Service: Orthopedics;  Laterality: Left;  left thumb carpal-metacarpal fusion   KNEE ARTHROSCOPY     both knees   OBLIQUE LUMBAR INTERBODY FUSION 1 LEVEL WITH PERCUTANEOUS SCREWS N/A 05/09/2022   Procedure: OBLIQUE LUMBAR INTERBODY FUSION 1 LEVEL WITH PERCUTANEOUS SCREWS (OLIF L4-5 WITH POSTERIOR SPINAL FUSION INTERBODY);  Surgeon: Mort Ards, MD;  Location: [facility] OR;  Service: Orthopedics;  Laterality: N/A;  4 hrs Dr. Fulton Job to do approach Left tap block with exparel    PARTIAL KNEE ARTHROPLASTY  11/05/2012   Procedure: UNICOMPARTMENTAL KNEE;  Surgeon: Bevin Bucks, MD;  Location: [facility] ORS;  Service: Orthopedics;  Laterality: Right;   TONSILLECTOMY     TOTAL SHOULDER ARTHROPLASTY Right 06/25/2020   Procedure: TOTAL SHOULDER ARTHROPLASTY;  Surgeon: Janeth Medicus, MD;  Location: [facility];  Service: Orthopedics;  Laterality: Right;  2.5 hrs   TOTAL SHOULDER REVISION Right  02/15/2023   Procedure: TOTAL SHOULDER REVISION;  Surgeon: Micheline Ahr, MD;  Location: [facility];  Service: Orthopedics;  Laterality: Right;   URETER SURGERY     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-little lt kidney function, has one functioning kidney   WISDOM TOOTH EXTRACTION      FAMILY HISTORY: Family History  Problem Relation Age of Onset   Arthritis Mother        spianl stenosis   Atrial fibrillation Mother    Transient ischemic attack Mother    High Cholesterol Mother    Diabetes Father    Heart attack Father        3 stents   Heart disease Father    Atrial fibrillation Sister    Depression Sister    Transient ischemic attack Maternal Aunt    Stroke Maternal Grandmother    Diabetes Paternal Grandmother     SOCIAL HISTORY: Social History   Socioeconomic History   Marital status: Single    Spouse name: Not on file   Number of children: Not on file   Years of education: Not on file   Highest education level: Not on file  Occupational History   Occupation: SSD  Tobacco Use   Smoking status: Never   Smokeless tobacco: Never  Vaping Use   Vaping status: Never Used  Substance and Sexual Activity   Alcohol use: No    Comment: quit 17 yrs ago   Drug use: Never   Sexual activity: Not on file  Other Topics Concern   Not on file  Social History Narrative   Lives home alone, (has dogs and cats).  Disabled, due to back.  Education 2 college degree's. 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   Coffee/ sodas 2-3 daily   Social Drivers of Health   Financial Resource Strain: Medium Risk (12/21/2023)   Received from [facility]   Overall Financial Resource Strain (CARDIA)    Difficulty of Paying Living Expenses: Somewhat hard  Food Insecurity: No Food Insecurity (12/21/2023)   Received from [facility]   Hunger Vital Sign    Worried About Running Out of Food in the Last Year: Never true    Ran Out of Food in the Last Year: Never true  Transportation Needs: No  Transportation Needs (12/21/2023)   Received from  [facility]   PRAPARE - Administrator, Civil Service (Medical): No    Lack of Transportation (Non-Medical): No  Physical Activity: Insufficiently Active (12/21/2023)   Received from [facility]   Exercise Vital Sign    Days of Exercise per Week: 7 days    Minutes of Exercise per Session: 10 min  Stress: No Stress Concern Present (12/21/2023)   Received from [facility] - Occupational Stress Questionnaire    Feeling of Stress : Not at all  Social Connections: Socially Integrated (12/21/2023)   Received from [facility]   Social Network    How would you rate your social network (family, work, friends)?: Good participation with social networks  Intimate Partner Violence: Not At Risk (12/21/2023)   Received from [facility]   HITS    Over the last 12 months how often did your partner physically hurt you?: Never    Over the last 12 months how often did your partner insult you or talk down to you?: Never    Over the last 12 months how often did your partner threaten you with physical harm?: Never    Over the last 12 months how often did your partner scream or curse at you?: Never      PHYSICAL EXAM  There were no vitals filed for this visit. There is no height or weight on file to calculate BMI.  Generalized: Well developed, in no acute distress   Neurological examination  Mentation: Alert oriented to time, place, history taking. Follows all commands speech and language fluent Cranial nerve II-XII: Pupils were equal round reactive to light. Extraocular movements were full, visual field were full on confrontational test. Facial sensation and strength were normal. Uvula tongue midline. Head turning and shoulder shrug  were normal and symmetric. Motor: The motor testing reveals 5 over 5 strength of all 4 extremities. Good symmetric motor tone is noted throughout.  Sensory:  Sensory testing is intact to soft touch on all 4 extremities. No evidence of extinction is noted.  Coordination: Cerebellar testing reveals good finger-nose-finger and heel-to-shin bilaterally.  Gait and station: Gait is normal. Tandem gait is normal. Romberg is negative. No drift is seen.  Reflexes: Deep tendon reflexes are symmetric and normal bilaterally.   DIAGNOSTIC DATA (LABS, IMAGING, TESTING) - I reviewed patient records, labs, notes, testing and imaging myself where available.  Lab Results  Component Value Date   WBC 8.2 02/03/2023   HGB 12.7 02/03/2023   HCT 41.0 02/03/2023   MCV 87.2 02/03/2023   PLT 354 02/03/2023      Component Value Date/Time   NA 143 12/11/2023 1103   K 5.1 12/11/2023 1103   CL 102 12/11/2023 1103   CO2 24 12/11/2023 1103   GLUCOSE 84 12/11/2023 1103   GLUCOSE 105 (H) 02/03/2023 1339   BUN 15 12/11/2023 1103   CREATININE 0.79 12/11/2023 1103   CALCIUM 9.8 12/11/2023 1103   PROT 7.2 12/11/2023 1103   ALBUMIN 4.6 12/11/2023 1103   AST 13 12/11/2023 1103   ALT 12 12/11/2023 1103   ALKPHOS 112 12/11/2023 1103   BILITOT 0.2 12/11/2023 1103   GFRNONAA >60 02/03/2023 1339   GFRAA 85 09/22/2020 0848   Lab Results  Component Value Date   CHOL 207 (H) 01/11/2022   HDL 86 01/11/2022   LDLCALC 109 (H) 01/11/2022   TRIG 69 01/11/2022   CHOLHDL 2.4 01/11/2022   Lab Results  Component Value Date  HGBA1C 5.5 12/11/2023   Lab Results  Component Value Date   VITAMINB12 453 12/11/2023   Lab Results  Component Value Date   TSH 1.580 12/01/2021      ASSESSMENT AND PLAN 58 y.o. year old female  has a past medical history of Allergic rhinitis, Anxiety, Arthritis, Back pain, Constipated, Depression, Diaphragmatic hernia, Fatty liver, GERD (gastroesophageal reflux disease), History of kidney stones, Hypertension, Hypothyroidism, IBS (irritable bowel syndrome), Joint pain, Kidney problem, Lactose intolerance, Morbid obesity (HCC), OSA (obstructive  sleep apnea), Osteoarthritis, Pre-diabetes, Seasonal allergies, Tinnitus, Vertigo, and Vitamin D  deficiency. here with ***     Clem Currier, MSN, NP-C 03/11/2024, 3:19 PM [facility] [address] [place], [place] [address] [phone]

## 2024-03-12 ENCOUNTER — Ambulatory Visit: Payer: Medicare HMO | Admitting: Adult Health

## 2024-03-12 ENCOUNTER — Encounter: Payer: Self-pay | Admitting: Adult Health

## 2024-03-27 ENCOUNTER — Ambulatory Visit (INDEPENDENT_AMBULATORY_CARE_PROVIDER_SITE_OTHER): Admitting: Adult Health

## 2024-03-27 VITALS — BP 106/67 | HR 79 | Temp 98.3°F | Ht 62.0 in | Wt 213.0 lb

## 2024-03-27 DIAGNOSIS — Z6841 Body Mass Index (BMI) 40.0 and over, adult: Secondary | ICD-10-CM

## 2024-03-27 DIAGNOSIS — E7849 Other hyperlipidemia: Secondary | ICD-10-CM | POA: Diagnosis not present

## 2024-03-27 DIAGNOSIS — R7303 Prediabetes: Secondary | ICD-10-CM | POA: Diagnosis not present

## 2024-03-27 DIAGNOSIS — E669 Obesity, unspecified: Secondary | ICD-10-CM

## 2024-03-27 DIAGNOSIS — I10 Essential (primary) hypertension: Secondary | ICD-10-CM

## 2024-03-27 NOTE — Progress Notes (Signed)
 WEIGHT SUMMARY AND BIOMETRICS  Vitals Temp: 98.3 F (36.8 C) BP: 106/67 Pulse Rate: 79 SpO2: 97 %   Anthropometric Measurements Height: 5\' 2"  (1.575 m) Weight: 213 lb (96.6 kg) BMI (Calculated): 38.95 Weight at Last Visit: 218 lb Weight Lost Since Last Visit: 5 lb Weight Gained Since Last Visit: 0 Starting Weight: 256 lb Total Weight Loss (lbs): 43 lb (19.5 kg) Peak Weight: 268 lb   Body Composition  Body Fat %: 50.4 % Fat Mass (lbs): 107.6 lbs Muscle Mass (lbs): 100.6 lbs Total Body Water  (lbs): 79 lbs Visceral Fat Rating : 16   Other Clinical Data Fasting: no Labs: no Today's Visit #: 27 Starting Date: 11/18/19    Chief Complaint:   OBESITY Melissa [name] is here to discuss her progress with her obesity treatment plan.  She is on the the Category 3 Plan and states she is following her eating plan approximately 70 % of the time.  She states she is exercising Walking/Yard Work 20 minutes 4-5 times per week.  Interim History:  She has been seen by PCP and Orthopedic Specialist/Dr. Bernard Brick   She is scheduled for  05/28/2024 ARTHROPLASTY, KNEE, TOTAL Right Spinal  Conversion of right partial knee    07/09/2024 ARTHROPLASTY, KNEE, TOTAL Left   Subjective:   1. Pre-diabetes Lab Results  Component Value Date   HGBA1C 5.5 12/11/2023   HGBA1C 5.9 (H) 07/13/2023   HGBA1C 5.8 (H) 01/04/2023    Continue daily Metformin  500mg  BID and weekly Zepbound  7.5mg  Denies mass in neck, dysphagia, dyspepsia, persistent hoarseness, abdominal pain, or worsening constipation She will experience nausea without vomiting 12-48 hrs after administration of Zepbound  7.5  2. Hypertension, unspecified type BP stable, yet soft at OV She denies sx's of hypotension She is currently on daily Losartan  25mg  - managed by her PCP  3. Other hyperlipidemia Lipid Panel     Component Value Date/Time   CHOL 207 (H) 01/11/2022 1217   TRIG 69 01/11/2022 1217   HDL 86 01/11/2022 1217    CHOLHDL 2.4 01/11/2022 1217   LDLCALC 109 (H) 01/11/2022 1217   LABVLDL 12 01/11/2022 1217    She is currently on daily Crestor 20mg  and weekly Zepbound  7.5mg  She denies CP with exertion She denies tobacco/vape use  Assessment/Plan:   1. Pre-diabetes (Primary) Continue Cat 3 MP Remain as active as tolerated Continue daily Metformin  500mg  BID and weekly Zepbound  7.5mg   2. Hypertension, unspecified type Limit Na+ intake Remain as active as tolerated, re: Bilateral chronic knee pain  3. Other hyperlipidemia Limit saturated fat Remain as active as tolerated, re: Bilateral chronic knee pain Continue daily statin therapy  4. Obesity, current BMI 49.1  Melissa [name] is currently in the action stage of change. As such, her goal is to continue with weight loss efforts. She has agreed to the Category 3 Plan.   Exercise goals: All adults should avoid inactivity. Some physical activity is better than none, and adults who participate in any amount of physical activity gain some health benefits. Adults should also include muscle-strengthening activities that involve all major muscle groups on 2 or more days a week.  Behavioral modification strategies: increasing lean protein intake, decreasing simple carbohydrates, increasing vegetables, increasing water  intake, meal planning and cooking strategies, keeping healthy foods in the home, ways to avoid boredom eating, and planning for success.  Melissa [name] has agreed to follow-up with our clinic in 4 weeks. She was informed of the importance of frequent follow-up visits to maximize her success  with intensive lifestyle modifications for her multiple health conditions.   Objective:   Blood pressure 106/67, pulse 79, temperature 98.3 F (36.8 C), height 5\' 2"  (1.575 m), weight 213 lb (96.6 kg), last menstrual period 10/12/2012, SpO2 97%. Body mass index is 38.96 kg/m.  General: Cooperative, alert, well developed, in no acute distress. HEENT: Conjunctivae and  lids unremarkable. Cardiovascular: Regular rhythm.  Lungs: Normal work of breathing. Neurologic: No focal deficits.   Lab Results  Component Value Date   CREATININE 0.79 12/11/2023   BUN 15 12/11/2023   NA 143 12/11/2023   K 5.1 12/11/2023   CL 102 12/11/2023   CO2 24 12/11/2023   Lab Results  Component Value Date   ALT 12 12/11/2023   AST 13 12/11/2023   ALKPHOS 112 12/11/2023   BILITOT 0.2 12/11/2023   Lab Results  Component Value Date   HGBA1C 5.5 12/11/2023   HGBA1C 5.9 (H) 07/13/2023   HGBA1C 5.8 (H) 01/04/2023   HGBA1C 5.7 (H) 05/05/2022   HGBA1C 5.7 (H) 01/11/2022   Lab Results  Component Value Date   INSULIN  9.2 12/11/2023   INSULIN  14.1 07/13/2023   INSULIN  14.0 01/04/2023   INSULIN  10.6 05/05/2022   INSULIN  8.3 01/11/2022   Lab Results  Component Value Date   TSH 1.580 12/01/2021   Lab Results  Component Value Date   CHOL 207 (H) 01/11/2022   HDL 86 01/11/2022   LDLCALC 109 (H) 01/11/2022   TRIG 69 01/11/2022   CHOLHDL 2.4 01/11/2022   Lab Results  Component Value Date   VD25OH 66.5 12/11/2023   VD25OH 47.9 07/13/2023   VD25OH 57.1 01/04/2023   Lab Results  Component Value Date   WBC 8.2 02/03/2023   HGB 12.7 02/03/2023   HCT 41.0 02/03/2023   MCV 87.2 02/03/2023   PLT 354 02/03/2023   No results found for: "IRON", "TIBC", "FERRITIN"  Attestation Statements:   Reviewed by clinician on day of visit: allergies, medications, problem list, medical history, surgical history, family history, social history, and previous encounter notes.  "27 minutes spent face-to-face with the patient discussing management of disordered eating symptoms, reviewing current medications, and providing strategies for coping with emotional eating."  I have reviewed the above documentation for accuracy and completeness, and I agree with the above. -  Rosea Dory d. Alyxandra Tenbrink, NP-C

## 2024-04-03 ENCOUNTER — Other Ambulatory Visit: Payer: Self-pay

## 2024-04-03 ENCOUNTER — Emergency Department (HOSPITAL_BASED_OUTPATIENT_CLINIC_OR_DEPARTMENT_OTHER)
Admission: EM | Admit: 2024-04-03 | Discharge: 2024-04-04 | Disposition: A | Discharge status: Home / Self Care | Source: Home / Self Care | Attending: Emergency Medicine | Admitting: Emergency Medicine

## 2024-04-03 ENCOUNTER — Encounter (HOSPITAL_BASED_OUTPATIENT_CLINIC_OR_DEPARTMENT_OTHER): Payer: Self-pay | Admitting: Emergency Medicine

## 2024-04-03 ENCOUNTER — Encounter (HOSPITAL_BASED_OUTPATIENT_CLINIC_OR_DEPARTMENT_OTHER): Payer: Self-pay

## 2024-04-03 ENCOUNTER — Emergency Department (HOSPITAL_BASED_OUTPATIENT_CLINIC_OR_DEPARTMENT_OTHER): Admission: EM | Admit: 2024-04-03 | Discharge: 2024-04-03 | Disposition: A | Discharge status: Home / Self Care

## 2024-04-03 ENCOUNTER — Emergency Department (HOSPITAL_BASED_OUTPATIENT_CLINIC_OR_DEPARTMENT_OTHER)

## 2024-04-03 DIAGNOSIS — Z79890 Hormone replacement therapy (postmenopausal): Secondary | ICD-10-CM | POA: Insufficient documentation

## 2024-04-03 DIAGNOSIS — R110 Nausea: Secondary | ICD-10-CM | POA: Diagnosis not present

## 2024-04-03 DIAGNOSIS — R1012 Left upper quadrant pain: Secondary | ICD-10-CM | POA: Diagnosis present

## 2024-04-03 DIAGNOSIS — N3000 Acute cystitis without hematuria: Secondary | ICD-10-CM | POA: Insufficient documentation

## 2024-04-03 DIAGNOSIS — R109 Unspecified abdominal pain: Secondary | ICD-10-CM | POA: Diagnosis present

## 2024-04-03 DIAGNOSIS — I10 Essential (primary) hypertension: Secondary | ICD-10-CM | POA: Insufficient documentation

## 2024-04-03 DIAGNOSIS — E039 Hypothyroidism, unspecified: Secondary | ICD-10-CM | POA: Diagnosis not present

## 2024-04-03 DIAGNOSIS — Z79899 Other long term (current) drug therapy: Secondary | ICD-10-CM | POA: Insufficient documentation

## 2024-04-03 DIAGNOSIS — Z7982 Long term (current) use of aspirin: Secondary | ICD-10-CM | POA: Insufficient documentation

## 2024-04-03 DIAGNOSIS — D72829 Elevated white blood cell count, unspecified: Secondary | ICD-10-CM | POA: Insufficient documentation

## 2024-04-03 DIAGNOSIS — Z87442 Personal history of urinary calculi: Secondary | ICD-10-CM | POA: Diagnosis not present

## 2024-04-03 LAB — CBC
HCT: 35.2 % — ABNORMAL LOW (ref 36.0–46.0)
Hemoglobin: 11.4 g/dL — ABNORMAL LOW (ref 12.0–15.0)
MCH: 27.9 pg (ref 26.0–34.0)
MCHC: 32.4 g/dL (ref 30.0–36.0)
MCV: 86.3 fL (ref 80.0–100.0)
Platelets: 339 10*3/uL (ref 150–400)
RBC: 4.08 MIL/uL (ref 3.87–5.11)
RDW: 15.1 % (ref 11.5–15.5)
WBC: 13.8 10*3/uL — ABNORMAL HIGH (ref 4.0–10.5)
nRBC: 0 % (ref 0.0–0.2)

## 2024-04-03 LAB — COMPREHENSIVE METABOLIC PANEL WITH GFR
ALT: 8 U/L (ref 0–44)
AST: 14 U/L — ABNORMAL LOW (ref 15–41)
Albumin: 4.2 g/dL (ref 3.5–5.0)
Alkaline Phosphatase: 114 U/L (ref 38–126)
Anion gap: 13 (ref 5–15)
BUN: 18 mg/dL (ref 6–20)
CO2: 20 mmol/L — ABNORMAL LOW (ref 22–32)
Calcium: 10.2 mg/dL (ref 8.9–10.3)
Chloride: 106 mmol/L (ref 98–111)
Creatinine, Ser: 0.92 mg/dL (ref 0.44–1.00)
GFR, Estimated: >60 mL/min (ref >60)
Glucose, Bld: 97 mg/dL (ref 70–99)
Potassium: 4 mmol/L (ref 3.5–5.1)
Sodium: 139 mmol/L (ref 135–145)
Total Bilirubin: 0.8 mg/dL (ref 0.0–1.2)
Total Protein: 7.5 g/dL (ref 6.5–8.1)

## 2024-04-03 LAB — URINALYSIS, ROUTINE W REFLEX MICROSCOPIC
Bilirubin Urine: NEGATIVE
Glucose, UA: NEGATIVE mg/dL
Nitrite: NEGATIVE
Specific Gravity, Urine: 1.027 (ref 1.005–1.030)
WBC, UA: >50 WBC/hpf (ref 0–5)
pH: 7.5 (ref 5.0–8.0)

## 2024-04-03 LAB — LIPASE, BLOOD: Lipase: 17 U/L (ref 11–51)

## 2024-04-03 MED ORDER — MAGNESIUM SULFATE 2 GM/50ML IV SOLN
2.0000 g | Freq: Once | INTRAVENOUS | Status: AC
  Administered 2024-04-03: 2 g via INTRAVENOUS
  Filled 2024-04-03: qty 50

## 2024-04-03 MED ORDER — SODIUM CHLORIDE 0.9 % IV BOLUS
1000.0000 mL | Freq: Once | INTRAVENOUS | Status: AC
  Administered 2024-04-03: 1000 mL via INTRAVENOUS

## 2024-04-03 MED ORDER — TAMSULOSIN HCL 0.4 MG PO CAPS: 0.4000 mg | ORAL_CAPSULE | Freq: Every day | ORAL | 0 refills | Status: DC

## 2024-04-03 MED ORDER — ONDANSETRON HCL 4 MG/2ML IJ SOLN
4.0000 mg | Freq: Once | INTRAMUSCULAR | Status: AC
  Administered 2024-04-03: 4 mg via INTRAVENOUS
  Filled 2024-04-03: qty 2

## 2024-04-03 MED ORDER — MORPHINE SULFATE (PF) 4 MG/ML IV SOLN
4.0000 mg | Freq: Once | INTRAVENOUS | Status: AC
  Administered 2024-04-03: 4 mg via INTRAVENOUS
  Filled 2024-04-03: qty 1

## 2024-04-03 MED ORDER — CEPHALEXIN 500 MG PO CAPS: 500.0000 mg | ORAL_CAPSULE | Freq: Two times a day (BID) | ORAL | 0 refills | Status: DC

## 2024-04-03 MED ORDER — TAMSULOSIN HCL 0.4 MG PO CAPS
0.4000 mg | ORAL_CAPSULE | ORAL | Status: AC
  Administered 2024-04-03: 0.4 mg via ORAL
  Filled 2024-04-03: qty 1

## 2024-04-03 MED ORDER — KETOROLAC TROMETHAMINE 30 MG/ML IJ SOLN
30.0000 mg | Freq: Once | INTRAMUSCULAR | Status: AC
  Administered 2024-04-03: 30 mg via INTRAVENOUS
  Filled 2024-04-03: qty 1

## 2024-04-03 MED ORDER — SODIUM CHLORIDE 0.9 % IV SOLN
1.0000 g | Freq: Once | INTRAVENOUS | Status: AC
  Administered 2024-04-03: 1 g via INTRAVENOUS
  Filled 2024-04-03: qty 10

## 2024-04-03 NOTE — ED Triage Notes (Addendum)
 Pt was seen here earlier today, labs and CT scan done dx with diverticulitis Back tonight because of increased pain +nausea Prescribed Keflex  and was not able to pick up today

## 2024-04-03 NOTE — ED Provider Notes (Signed)
 [place] EMERGENCY DEPARTMENT AT [facility] Provider Note   CSN: [id number] Arrival date & time: 04/03/24  1417     History  Chief Complaint  Patient presents with   Flank Pain    Melissa [name] is a 58 y.o. female.  58 year old female with a past medical history of IBS-C, GERD, hypertension presents to the ED with a chief complaint of left flank pain since this morning.  Patient describes a burning sensation that radiates from the left flank onto the left lower quadrant, back and forth.  Symptoms are constant.  Worse with deep inspiration, along with rolling over on his side.  He did try to take a pill for constipation?  With no help today.  She did have a small bowel movement today, full bowel movement yesterday without any blood present.  She does feel somewhat dehydrated, feels it is a little bit difficult for her to urinate.  Also endorsing some nausea but no episodes of vomiting.  Last oral intake was yesterday.  Dors is a temperature of 99.9 at home.  No vomiting, chest pain, trauma.  She does have a prior history of abdominal surgery, but reports no organs have been removed.  She does state that her left kidney does not work properly .    The history is provided by the patient.  Flank Pain This is a new problem. The problem occurs constantly. The problem has not changed since onset.Associated symptoms include abdominal pain. Pertinent negatives include no chest pain, no headaches and no shortness of breath.       Home Medications Prior to Admission medications   Medication Sig Start Date End Date Taking? Authorizing Provider  cephALEXin  (KEFLEX ) 500 MG capsule Take 1 capsule (500 mg total) by mouth 2 (two) times daily for 7 days. 04/03/24 04/10/24 Yes Mccall Will, PA-C  Ascorbic Acid (VITAMIN C) 1000 MG tablet Take 3,000 mg by mouth at bedtime.    [provider]  aspirin  81 MG chewable tablet Chew 81 mg by mouth. 06/09/23 06/08/24  [provider]  BREYNA 80-4.5 MCG/ACT inhaler Inhale 2 puffs into the lungs daily as needed.    [provider]  buPROPion  (WELLBUTRIN  XL) 300 MG 24 hr tablet Take 1 tablet by mouth every morning. 03/04/24   [provider]  busPIRone  (BUSPAR ) 5 MG tablet Take 5 mg by mouth 2 (two) times daily as needed for anxiety. 06/15/20   [provider]  chlorpheniramine (CHLOR-TRIMETON) 4 MG tablet Take 4 mg by mouth daily.    [provider]  cholecalciferol (VITAMIN D3) 25 MCG (1000 UNIT) tablet Take 1,000 Units by mouth daily.    [provider]  diphenhydrAMINE  (BENADRYL ) 25 mg capsule Take 1 capsule (25 mg total) by mouth every 6 (six) hours as needed for itching, allergies or sleep. 11/06/12   Equilla Hastings, PA-C  EPINEPHrine  0.3 mg/0.3 mL IJ SOAJ injection Inject 0.3 mg into the muscle as needed for anaphylaxis. 02/20/24   Danford, Acie Holiday D, NP  famotidine  (PEPCID ) 20 MG tablet Take 1 tablet by mouth 2 (two) times daily.    [provider]  FLUoxetine  (PROZAC ) 40 MG capsule Take 40 mg by mouth daily.    [provider]  gabapentin  (NEURONTIN ) 100 MG capsule Take 1 capsule (100 mg total) by mouth 3 (three) times daily. For pain 02/16/23   Adine Ahmadi, PA-C  hydrOXYzine  (ATARAX ) 25 MG tablet Take 1 tablet by mouth every 6 (six) hours.  [provider]  levothyroxine  (SYNTHROID ) 150 MCG tablet Take 1 tablet by mouth daily.    [provider]  losartan  (COZAAR ) 25 MG tablet Take 1 tablet by mouth daily.    [provider]  meclizine  (ANTIVERT ) 25 MG tablet Take 25 mg by mouth 3 (three) times daily as needed for dizziness.    [provider]  metFORMIN  (GLUCOPHAGE ) 500 MG tablet 1 tab at lunch 1 tab at dinner 02/20/24   Danford, Acie Holiday D, NP  metoCLOPramide  (REGLAN ) 10 MG tablet Take 10 mg by mouth. 03/26/24   [provider]  montelukast  (SINGULAIR ) 10 MG tablet Take 10 mg by mouth at bedtime.     [provider]  mupirocin ointment (BACTROBAN) 2 % Apply 1 Application topically daily as needed (irritation).    [provider]  Naphazoline-Pheniramine ([employer] EYE ALLERGY RELIEF OP) Place 1 drop into both eyes daily as needed (allergies).    [provider]  omeprazole (PRILOSEC) 40 MG capsule Take 40 mg by mouth at bedtime.     [provider]  rosuvastatin (CRESTOR) 10 MG tablet Take 10 mg by mouth. 03/26/24   [provider]  tirzepatide  (ZEPBOUND ) 7.5 MG/0.5ML Pen Inject 7.5 mg into the skin once a week. 02/20/24   Danford, Acie Holiday D, NP  triamcinolone  cream (KENALOG ) 0.5 % Apply 1 Application topically daily as needed (irritation).    [provider]  UNABLE TO FIND Inhale 2 puffs into the lungs 2 (two) times daily.    [provider]      Allergies    Bee venom and Lactose intolerance (gi)    Review of Systems   Review of Systems  Constitutional:  Positive for fever. Negative for chills.  Respiratory:  Negative for shortness of breath.   Cardiovascular:  Negative for chest pain.  Gastrointestinal:  Positive for abdominal pain and nausea. Negative for vomiting.  Genitourinary:  Positive for flank pain.  Musculoskeletal:  Negative for back pain.  Neurological:  Negative for headaches.  All other systems reviewed and are negative.   Physical Exam Updated Vital Signs BP (!) 140/68 (BP Location: Right Arm)   Pulse 89   Temp 98.7 F (37.1 C) (Oral)   Ht 5' 2 (1.575 m)   Wt 96.6 kg   LMP 10/12/2012   BMI 38.96 kg/m  Physical Exam Vitals and nursing note reviewed.  Constitutional:      Appearance: Normal appearance.  HENT:     Head: Normocephalic and atraumatic.     Mouth/Throat:     Mouth: Mucous membranes are moist.  Eyes:     Pupils: Pupils are equal, round, and reactive to light.  Cardiovascular:     Rate and Rhythm: Normal rate.  Pulmonary:     Effort: Pulmonary effort is normal.     Breath sounds: No  wheezing or rales.  Abdominal:     Palpations: Abdomen is soft.     Tenderness: There is abdominal tenderness in the left upper quadrant. There is left CVA tenderness. There is no right CVA tenderness.     Hernia: No hernia is present.  Musculoskeletal:     Cervical back: Normal range of motion and neck supple.  Skin:    General: Skin is warm and dry.  Neurological:     Mental Status: She is alert and oriented to person, place, and time.     ED Results / Procedures / Treatments   Labs (all labs ordered are listed, but only  abnormal results are displayed) Labs Reviewed  URINALYSIS, ROUTINE W REFLEX MICROSCOPIC - Abnormal; Notable for the following components:      Result Value   APPearance HAZY (*)    Hgb urine dipstick TRACE (*)    Ketones, ur TRACE (*)    Protein, ur TRACE (*)    Leukocytes,Ua LARGE (*)    Bacteria, UA FEW (*)    All other components within normal limits  CBC - Abnormal; Notable for the following components:   WBC 13.8 (*)    Hemoglobin 11.4 (*)    HCT 35.2 (*)    All other components within normal limits  COMPREHENSIVE METABOLIC PANEL WITH GFR - Abnormal; Notable for the following components:   CO2 20 (*)    AST 14 (*)    All other components within normal limits  URINE CULTURE  LIPASE, BLOOD    EKG None  Radiology CT Renal Stone Study Result Date: 04/03/2024 CLINICAL DATA:  Left flank pain radiating into the groin. Fall today. Difficulty urinating today. EXAM: CT ABDOMEN AND PELVIS WITHOUT CONTRAST TECHNIQUE: Multidetector CT imaging of the abdomen and pelvis was performed following the standard protocol without IV contrast. RADIATION DOSE REDUCTION: This exam was performed according to the departmental dose-optimization program which includes automated exposure control, adjustment of the mA and/or kV according to patient size and/or use of iterative reconstruction technique. COMPARISON:  None Available. FINDINGS: Lower chest: Unremarkable  Hepatobiliary: Unremarkable Pancreas: Unremarkable Spleen: Trace fluid along the inferomedial margin of the spleen is attributable to the right renal/perirenal process. Adrenals/Urinary Tract: Confluent cystic lesions in the expected location of the left kidney suspicious for severely dilated left renal collecting system and prominent/marked thinning of the left renal parenchyma, with surrounding edema and stranding in the perirenal space favoring active inflammation. No dilated left ureter or stone, appearance suggests severe UPJ narrowing. No gas in the collecting system or perirenal space. The right kidney appears unremarkable. Perirenal stranding partially extends next to the left adrenal gland. Urinary bladder unremarkable. Stomach/Bowel: Scattered diverticula of the descending colon without findings of active diverticulitis. Normal appendix. Vascular/Lymphatic: Atherosclerosis is present, including aortoiliac atherosclerotic disease. There is some atheromatous plaque dorsally at the origins of the celiac trunk and SMA. Reproductive: Unremarkable Other: No supplemental non-categorized findings. Musculoskeletal: PLIF at L4-5. Degenerative facet arthropathy and degenerative disc disease at L3-4. Mild sclerosis along the left sacroiliac joint, likely degenerative. IMPRESSION: 1. Severe left renal atrophy with markedly dilated left renal collecting system but also left perirenal stranding suggesting active inflammation. Based on investigation of outside reports from [facility], findings of end-stage left renal atrophy and left hydronephrosis have been reported previously such as on 09/09/2025 CT abdomen, and a nuclear medicine renogram performed on 05/22/2018 showed 99% of relative renal mass on the right side. Accordingly, some of these findings are likely chronic but the left perirenal stranding is more likely to be acute and suggests active inflammation or possibly infection. 2. Scattered diverticula of the  descending colon without findings of active diverticulitis. 3. PLIF at L4-5. Degenerative facet arthropathy and degenerative disc disease at L3-4. 4. Mild sclerosis along the left sacroiliac joint, likely degenerative. 5.  Aortic Atherosclerosis (ICD10-I70.0). Electronically Signed   By: Freida Jes M.D.   On: 04/03/2024 17:06    Procedures Procedures    Medications Ordered in ED Medications  ondansetron  (ZOFRAN ) injection 4 mg (4 mg Intravenous Given 04/03/24 1456)  morphine  (PF) 4 MG/ML injection 4 mg (4 mg Intravenous Given 04/03/24 1457)  sodium chloride  0.9 % bolus 1,000 mL (0 mLs Intravenous Stopped 04/03/24 1715)  cefTRIAXone (ROCEPHIN) 1 g in sodium chloride  0.9 % 100 mL IVPB (1 g Intravenous New Bag/Given 04/03/24 1748)    ED Course/ Medical Decision Making/ A&P Clinical Course as of 04/03/24 1820  Wed Apr 03, 2024  1624 Leukocytes,Ua(!): LARGE [JS]  1625 Bacteria, UA(!): FEW [JS]  1625 Hgb urine dipstick(!): TRACE [JS]  1627 WBC(!): 13.8 [JS]  1630 WBC, UA: >50 [JS]  1740 CT Renal Lindsay Rho Study [JS]    Clinical Course User Index [JS] Taleen Prosser, PA-C                                 Medical Decision Making Amount and/or Complexity of Data Reviewed Labs: ordered. Decision-making details documented in ED Course. Radiology: ordered. Decision-making details documented in ED Course.  Risk Prescription drug management.    This patient presents to the ED for concern of left flank pain, this involves a number of treatment options, and is a complaint that carries with it a high risk of complications and morbidity.  The differential diagnosis includes pancreatitis, renal colic versus pyelonephritis.   Co morbidities: Discussed in HPI   Brief History:  See HPI.   EMR reviewed including pt PMHx, past surgical history and past visits to ER.   See HPI for more details   Lab Tests:  I ordered and independently interpreted labs.  The pertinent results include:     Labs notable for CBC with a leukocytosis of 13.8, hemoglobin slightly decreased however no bleeding noted.  CMP with no electrolyte derangement, creatinine levels unremarkable.  LFTs are within normal limits.  UA with a trace of hemoglobin, trace of ketones, large leukocytes and greater than 50 white blood cell count.  She is not having any urinary symptoms at this time, although I suspect likely stone involvement    Imaging Studies:  CT Renal stone: IMPRESSION:  1. Severe left renal atrophy with markedly dilated left renal  collecting system but also left perirenal stranding suggesting  active inflammation. Based on investigation of outside reports from  [facility], findings of end-stage left renal atrophy and left  hydronephrosis have been reported previously such as on 09/09/2025  CT abdomen, and a nuclear medicine renogram performed on 05/22/2018  showed 99% of relative renal mass on the right side. Accordingly,  some of these findings are likely chronic but the left perirenal  stranding is more likely to be acute and suggests active  inflammation or possibly infection.  2. Scattered diverticula of the descending colon without findings of  active diverticulitis.  3. PLIF at L4-5. Degenerative facet arthropathy and degenerative  disc disease at L3-4.  4. Mild sclerosis along the left sacroiliac joint, likely  degenerative.  5.  Aortic Atherosclerosis (ICD10-I70.0).     Medicines ordered:  I ordered medication including zofran , morphine ,bolus   for symptomatic treatment  Reevaluation of the patient after these medicines showed that the patient improved I have reviewed the patients home medicines and have made adjustments as needed  Reevaluation:  After the interventions noted above I re-evaluated patient and found that they have :improved  Social Determinants of Health:  The patient's social determinants of health were a factor in the care of this patient  Problem  List / ED Course:  Patient presented to the ED with a chief complaint of left flank pain since this morning, burning sensation  radiating from the left flank and to the left lower quadrant, also experiencing some nausea but no episodes of vomiting, had a elevated temperature of 99.9 while at home.  No relief despite over-the-counter medication.  Prior history of left kidney atrophy, reports she had a history of kidney stones however this feels different.  On evaluation there is tenderness to palpation along the left lower quadrant, there is left CVA tenderness noted.  Vitals are within normal limits, she is afebrile while in the ED.  Given Zofran , morphine , bolus to help with symptomatic treatment. Interpretation of her blood work by me reveals CMP with no electrolyte derangement, creatinine levels unremarkable.  CBC with a slight leukocytosis of 13.8, hemoglobin stable.  UA with a trace of hemoglobin, ketones, large leukocytes and few bacteria, will send this for urine culture. CT renal study showed inflammation versus infection.  In the setting of subjective fever, elevated white blood cell count, and lack of urinary symptoms we will treat for infection at this time.  Given 1 g of Rocephin, along with will go on a short course of Keflex  to help with treatment of likely pyelonephritis.  She is tolerating p.o. adequately, I do not feel that patient needs admission at this time.  Strict return precautions discussed at length, hemodynamically stable for discharge.  Dispostion:  After consideration of the diagnostic results and the patients response to treatment, I feel that the patent would benefit from continued treatment of her likely pyelonephritis with antibiotics for the next 7 days.  Will need to follow-up with primary care physician as needed    Portions of this note were generated with Dragon dictation software. Dictation errors may occur despite best attempts at proofreading.   Final Clinical  Impression(s) / ED Diagnoses Final diagnoses:  Left flank pain    Rx / DC Orders ED Discharge Orders          Ordered    cephALEXin  (KEFLEX ) 500 MG capsule  2 times daily        04/03/24 1801              Reighlynn Swiney, PA-C 04/03/24 1820    Carin Charleston, MD 04/04/24 1530

## 2024-04-03 NOTE — ED Triage Notes (Signed)
 Left flank pain radiating into groin. Starting this AM. Melissa [name] at home earlier today due to being unsteady on feet today. Difficulty urinating today. Denies CP, SOB.

## 2024-04-03 NOTE — ED Provider Notes (Signed)
 [place] EMERGENCY DEPARTMENT AT [facility] Provider Note   CSN: [id number] Arrival date & time: 04/03/24  2242     History  Chief Complaint  Patient presents with   Flank Pain    Melissa [name] is a 58 y.o. female.  The history is provided by the patient.  Flank Pain This is a recurrent problem. The current episode started 3 to 5 hours ago. The problem occurs constantly. The problem has not changed since onset.Pertinent negatives include no chest pain, no abdominal pain, no headaches and no shortness of breath. Nothing aggravates the symptoms. Nothing relieves the symptoms. She has tried acetaminophen  for the symptoms. The treatment provided no relief.  Patient with GERD and IBS who was diagnosed with UTI in this ED earlier this evening. Unable to fill medication and still in pain.       Past Medical History:  Diagnosis Date   Allergic rhinitis    Anxiety    Arthritis    Back pain    Constipated    Depression    Diaphragmatic hernia    Fatty liver    GERD (gastroesophageal reflux disease)    History of kidney stones    Hypertension    Hypothyroidism    IBS (irritable bowel syndrome)    Joint pain    Kidney problem    one functioning kidney   Lactose intolerance    Morbid obesity (HCC)    OSA (obstructive sleep apnea)    Osteoarthritis    Pre-diabetes    Seasonal allergies    Tinnitus    Vertigo    Vitamin D  deficiency      Home Medications Prior to Admission medications   Medication Sig Start Date End Date Taking? Authorizing Provider  tamsulosin (FLOMAX) 0.4 MG CAPS capsule Take 1 capsule (0.4 mg total) by mouth daily. 04/03/24  Yes Khaleelah Yowell, MD  Ascorbic Acid (VITAMIN C) 1000 MG tablet Take 3,000 mg by mouth at bedtime.    [provider]  aspirin  81 MG chewable tablet Chew 81 mg by mouth. 06/09/23 06/08/24  [provider]  BREYNA 80-4.5 MCG/ACT inhaler Inhale 2 puffs into the lungs daily as needed.    [provider]  buPROPion  (WELLBUTRIN  XL) 300 MG 24 hr tablet Take 1 tablet by mouth every morning. 03/04/24   [provider]  busPIRone  (BUSPAR ) 5 MG tablet Take 5 mg by mouth 2 (two) times daily as needed for anxiety. 06/15/20   [provider]  cephALEXin  (KEFLEX ) 500 MG capsule Take 1 capsule (500 mg total) by mouth 2 (two) times daily for 7 days. 04/03/24 04/10/24  Soto, Johana, PA-C  chlorpheniramine (CHLOR-TRIMETON) 4 MG tablet Take 4 mg by mouth daily.    [provider]  cholecalciferol (VITAMIN D3) 25 MCG (1000 UNIT) tablet Take 1,000 Units by mouth daily.    [provider]  diphenhydrAMINE  (BENADRYL ) 25 mg capsule Take 1 capsule (25 mg total) by mouth every 6 (six) hours as needed for itching, allergies or sleep. 11/06/12   Equilla Hastings, PA-C  EPINEPHrine  0.3 mg/0.3 mL IJ SOAJ injection Inject 0.3 mg into the muscle as needed for anaphylaxis. 02/20/24   Danford, Acie Holiday D, NP  famotidine  (PEPCID ) 20 MG tablet Take 1 tablet by mouth 2 (two) times daily.    [provider]  FLUoxetine  (PROZAC ) 40 MG capsule Take 40 mg by mouth daily.    [provider]  gabapentin  (NEURONTIN ) 100 MG capsule Take 1 capsule (100  mg total) by mouth 3 (three) times daily. For pain 02/16/23   Adine Ahmadi, PA-C  hydrOXYzine  (ATARAX ) 25 MG tablet Take 1 tablet by mouth every 6 (six) hours.    [provider]  levothyroxine  (SYNTHROID ) 150 MCG tablet Take 1 tablet by mouth daily.    [provider]  losartan  (COZAAR ) 25 MG tablet Take 1 tablet by mouth daily.    [provider]  meclizine  (ANTIVERT ) 25 MG tablet Take 25 mg by mouth 3 (three) times daily as needed for dizziness.    [provider]  metFORMIN  (GLUCOPHAGE ) 500 MG tablet 1 tab at lunch 1 tab at dinner 02/20/24   Danford, Acie Holiday D, NP  metoCLOPramide  (REGLAN ) 10 MG tablet Take 10 mg by mouth. 03/26/24   [provider]  montelukast  (SINGULAIR ) 10 MG tablet  Take 10 mg by mouth at bedtime.    [provider]  mupirocin ointment (BACTROBAN) 2 % Apply 1 Application topically daily as needed (irritation).    [provider]  Naphazoline-Pheniramine ([employer] EYE ALLERGY RELIEF OP) Place 1 drop into both eyes daily as needed (allergies).    [provider]  omeprazole (PRILOSEC) 40 MG capsule Take 40 mg by mouth at bedtime.     [provider]  rosuvastatin (CRESTOR) 10 MG tablet Take 10 mg by mouth. 03/26/24   [provider]  tirzepatide  (ZEPBOUND ) 7.5 MG/0.5ML Pen Inject 7.5 mg into the skin once a week. 02/20/24   Danford, Acie Holiday D, NP  triamcinolone  cream (KENALOG ) 0.5 % Apply 1 Application topically daily as needed (irritation).    [provider]  UNABLE TO FIND Inhale 2 puffs into the lungs 2 (two) times daily.    [provider]      Allergies    Bee venom and Lactose intolerance (gi)    Review of Systems   Review of Systems  Constitutional:  Negative for fever.  Respiratory:  Negative for shortness of breath.   Cardiovascular:  Negative for chest pain.  Gastrointestinal:  Negative for abdominal pain.  Genitourinary:  Positive for flank pain.  Neurological:  Negative for headaches.  All other systems reviewed and are negative.   Physical Exam Updated Vital Signs BP 115/62 (BP Location: Right Arm)   Pulse 90   Temp 98.5 F (36.9 C) (Oral)   Resp 18   Ht 5' 2 (1.575 m)   Wt 96 kg   LMP 10/12/2012   SpO2 97%   BMI 38.71 kg/m  Physical Exam Vitals and nursing note reviewed.  Constitutional:      General: She is not in acute distress.    Appearance: Normal appearance. She is well-developed.  HENT:     Head: Normocephalic and atraumatic.     Nose: Nose normal.  Eyes:     Pupils: Pupils are equal, round, and reactive to light.  Cardiovascular:     Rate and Rhythm: Normal rate and regular rhythm.     Pulses: Normal pulses.     Heart sounds: Normal heart sounds.   Pulmonary:     Effort: Pulmonary effort is normal. No respiratory distress.     Breath sounds: Normal breath sounds.  Abdominal:     General: Bowel sounds are normal. There is no distension.     Palpations: Abdomen is soft.     Tenderness: There is no abdominal tenderness. There is no guarding or rebound.  Musculoskeletal:        General: Normal range of motion.  Cervical back: Normal range of motion and neck supple.  Skin:    General: Skin is warm and dry.     Capillary Refill: Capillary refill takes less than 2 seconds.     Findings: No erythema or rash.  Neurological:     General: No focal deficit present.     Mental Status: She is alert and oriented to person, place, and time.     Deep Tendon Reflexes: Reflexes normal.  Psychiatric:        Mood and Affect: Mood normal.     ED Results / Procedures / Treatments   Labs (all labs ordered are listed, but only abnormal results are displayed) Labs Reviewed - No data to display  EKG None  Radiology CT Renal Stone Study Result Date: 04/03/2024 CLINICAL DATA:  Left flank pain radiating into the groin. Fall today. Difficulty urinating today. EXAM: CT ABDOMEN AND PELVIS WITHOUT CONTRAST TECHNIQUE: Multidetector CT imaging of the abdomen and pelvis was performed following the standard protocol without IV contrast. RADIATION DOSE REDUCTION: This exam was performed according to the departmental dose-optimization program which includes automated exposure control, adjustment of the mA and/or kV according to patient size and/or use of iterative reconstruction technique. COMPARISON:  None Available. FINDINGS: Lower chest: Unremarkable Hepatobiliary: Unremarkable Pancreas: Unremarkable Spleen: Trace fluid along the inferomedial margin of the spleen is attributable to the right renal/perirenal process. Adrenals/Urinary Tract: Confluent cystic lesions in the expected location of the left kidney suspicious for severely dilated left renal  collecting system and prominent/marked thinning of the left renal parenchyma, with surrounding edema and stranding in the perirenal space favoring active inflammation. No dilated left ureter or stone, appearance suggests severe UPJ narrowing. No gas in the collecting system or perirenal space. The right kidney appears unremarkable. Perirenal stranding partially extends next to the left adrenal gland. Urinary bladder unremarkable. Stomach/Bowel: Scattered diverticula of the descending colon without findings of active diverticulitis. Normal appendix. Vascular/Lymphatic: Atherosclerosis is present, including aortoiliac atherosclerotic disease. There is some atheromatous plaque dorsally at the origins of the celiac trunk and SMA. Reproductive: Unremarkable Other: No supplemental non-categorized findings. Musculoskeletal: PLIF at L4-5. Degenerative facet arthropathy and degenerative disc disease at L3-4. Mild sclerosis along the left sacroiliac joint, likely degenerative. IMPRESSION: 1. Severe left renal atrophy with markedly dilated left renal collecting system but also left perirenal stranding suggesting active inflammation. Based on investigation of outside reports from [facility], findings of end-stage left renal atrophy and left hydronephrosis have been reported previously such as on 09/09/2025 CT abdomen, and a nuclear medicine renogram performed on 05/22/2018 showed 99% of relative renal mass on the right side. Accordingly, some of these findings are likely chronic but the left perirenal stranding is more likely to be acute and suggests active inflammation or possibly infection. 2. Scattered diverticula of the descending colon without findings of active diverticulitis. 3. PLIF at L4-5. Degenerative facet arthropathy and degenerative disc disease at L3-4. 4. Mild sclerosis along the left sacroiliac joint, likely degenerative. 5.  Aortic Atherosclerosis (ICD10-I70.0). Electronically Signed   By: Freida Jes M.D.   On: 04/03/2024 17:06    Procedures Procedures    Medications Ordered in ED Medications  cefTRIAXone (ROCEPHIN) 1 g in sodium chloride  0.9 % 100 mL IVPB (1 g Intravenous New Bag/Given 04/03/24 2334)  ketorolac  (TORADOL ) 30 MG/ML injection 30 mg (30 mg Intravenous Given 04/03/24 2314)  magnesium  sulfate IVPB 2 g 50 mL (2 g Intravenous New Bag/Given 04/03/24 2317)  tamsulosin (FLOMAX) capsule 0.4 mg (  0.4 mg Oral Given 04/03/24 2335)    ED Course/ Medical Decision Making/ A&P                                 Medical Decision Making Patient with ongoing flank pain since discharge   Amount and/or Complexity of Data Reviewed Independent Historian: friend    Details: See above  External Data Reviewed: labs, radiology and notes.    Details: Previous ED visit from today reviewed   Risk Prescription drug management. Risk Details: Treated for pain in the ED.  Marked reduction in pain.  Will add flomax for anti spasm relief.  Stable for discharge with close follow up    Final Clinical Impression(s) / ED Diagnoses Final diagnoses:  Acute cystitis without hematuria    No signs of systemic illness or infection. The patient is nontoxic-appearing on exam and vital signs are within normal limits.  I have reviewed the triage vital signs and the nursing notes. Pertinent labs & imaging results that were available during my care of the patient were reviewed by me and considered in my medical decision making (see chart for details). After history, exam, and medical workup I feel the patient has been appropriately medically screened and is safe for discharge home. Pertinent diagnoses were discussed with the patient. Patient was given return precautions.    Rx / DC Orders ED Discharge Orders          Ordered    tamsulosin (FLOMAX) 0.4 MG CAPS capsule  Daily        04/03/24 2317              Temia Debroux, MD 04/04/24 0007

## 2024-04-03 NOTE — Discharge Instructions (Addendum)
 I have prescribed antibiotics to help treat your likely kidney infection.  Please take 1 tablet twice a day for the next 7 days.  If you experience any worsening symptoms such as fever, vomiting, worsening pain please return to the emergency department.

## 2024-04-04 LAB — URINE CULTURE: Culture: >=100000 — AB

## 2024-04-04 MED ORDER — OXYCODONE-ACETAMINOPHEN 5-325 MG PO TABS
1.0000 | ORAL_TABLET | Freq: Once | ORAL | Status: AC
  Administered 2024-04-04: 1 via ORAL
  Filled 2024-04-04: qty 1

## 2024-04-04 MED ORDER — FENTANYL CITRATE PF 50 MCG/ML IJ SOSY: 50.0000 ug | PREFILLED_SYRINGE | Freq: Once | INTRAMUSCULAR | Status: DC

## 2024-04-06 ENCOUNTER — Other Ambulatory Visit (INDEPENDENT_AMBULATORY_CARE_PROVIDER_SITE_OTHER): Payer: Self-pay | Admitting: Adult Health

## 2024-04-06 ENCOUNTER — Telehealth (HOSPITAL_BASED_OUTPATIENT_CLINIC_OR_DEPARTMENT_OTHER): Payer: Self-pay | Admitting: *Deleted

## 2024-04-06 NOTE — Telephone Encounter (Signed)
 Post ED Visit - Positive Culture Follow-up  Culture report reviewed by antimicrobial stewardship pharmacist: [facility] []  Court Distance, Pharm.D. []  Skeet Duke, Pharm.D., BCPS AQ-ID []  Leslee Rase, Pharm.D., BCPS []  Garland Junk, [address].D., BCPS []  [place], [address].D., BCPS, AAHIVP []  Alcide Aly, Pharm.D., BCPS, AAHIVP []  Jerri Morale, PharmD, BCPS []  Graham Laws, PharmD, BCPS []  Cleda Curly, PharmD, BCPS []  Tamar Fairly, PharmD []  Ballard Levels, PharmD, BCPS [x]  Trinidad Funk, PharmD  [facility] []  [name] Bering, PharmD []  Sherryle Don, PharmD []  Van Gelinas, PharmD []  Delila Felty, Rph []  [place]) Cleora Daft, PharmD []  Augustina Block, PharmD []  Arie Kurtz, PharmD []  Sharlyn Deaner, PharmD []  Agnes Hose, PharmD []  Kendall Pauls, PharmD []  Gladstone Lamer, PharmD []  Armanda Bern, PharmD []  Tera Fellows, PharmD   Positive urine culture Treated with Cephalexin , organism sensitive to the same and no further patient follow-up is required at this time.  Georgine Kitchens 04/06/2024, 11:07 AM

## 2024-04-09 ENCOUNTER — Encounter (HOSPITAL_COMMUNITY): Payer: Self-pay | Admitting: Internal Medicine

## 2024-04-09 ENCOUNTER — Emergency Department (HOSPITAL_COMMUNITY)

## 2024-04-09 ENCOUNTER — Other Ambulatory Visit: Payer: Self-pay

## 2024-04-09 ENCOUNTER — Inpatient Hospital Stay (HOSPITAL_COMMUNITY)
Admission: EM | Admit: 2024-04-09 | Discharge: 2024-04-17 | DRG: 690 | Disposition: A | Discharge status: Home / Self Care | Attending: Family Medicine | Admitting: Family Medicine

## 2024-04-09 DIAGNOSIS — Z79899 Other long term (current) drug therapy: Secondary | ICD-10-CM

## 2024-04-09 DIAGNOSIS — D638 Anemia in other chronic diseases classified elsewhere: Secondary | ICD-10-CM | POA: Diagnosis present

## 2024-04-09 DIAGNOSIS — Z7984 Long term (current) use of oral hypoglycemic drugs: Secondary | ICD-10-CM

## 2024-04-09 DIAGNOSIS — Z83438 Family history of other disorder of lipoprotein metabolism and other lipidemia: Secondary | ICD-10-CM

## 2024-04-09 DIAGNOSIS — K219 Gastro-esophageal reflux disease without esophagitis: Secondary | ICD-10-CM | POA: Diagnosis present

## 2024-04-09 DIAGNOSIS — N10 Acute tubulo-interstitial nephritis: Secondary | ICD-10-CM | POA: Diagnosis not present

## 2024-04-09 DIAGNOSIS — I10 Essential (primary) hypertension: Secondary | ICD-10-CM | POA: Diagnosis present

## 2024-04-09 DIAGNOSIS — D509 Iron deficiency anemia, unspecified: Secondary | ICD-10-CM | POA: Diagnosis present

## 2024-04-09 DIAGNOSIS — B961 Klebsiella pneumoniae [K. pneumoniae] as the cause of diseases classified elsewhere: Secondary | ICD-10-CM | POA: Diagnosis present

## 2024-04-09 DIAGNOSIS — Z6839 Body mass index (BMI) 39.0-39.9, adult: Secondary | ICD-10-CM

## 2024-04-09 DIAGNOSIS — E785 Hyperlipidemia, unspecified: Secondary | ICD-10-CM | POA: Diagnosis present

## 2024-04-09 DIAGNOSIS — E739 Lactose intolerance, unspecified: Secondary | ICD-10-CM | POA: Diagnosis present

## 2024-04-09 DIAGNOSIS — F419 Anxiety disorder, unspecified: Secondary | ICD-10-CM | POA: Diagnosis present

## 2024-04-09 DIAGNOSIS — Z7985 Long-term (current) use of injectable non-insulin antidiabetic drugs: Secondary | ICD-10-CM

## 2024-04-09 DIAGNOSIS — Z981 Arthrodesis status: Secondary | ICD-10-CM | POA: Diagnosis not present

## 2024-04-09 DIAGNOSIS — F32A Depression, unspecified: Secondary | ICD-10-CM | POA: Diagnosis present

## 2024-04-09 DIAGNOSIS — K589 Irritable bowel syndrome without diarrhea: Secondary | ICD-10-CM | POA: Diagnosis present

## 2024-04-09 DIAGNOSIS — E039 Hypothyroidism, unspecified: Secondary | ICD-10-CM | POA: Diagnosis present

## 2024-04-09 DIAGNOSIS — Z905 Acquired absence of kidney: Secondary | ICD-10-CM | POA: Diagnosis not present

## 2024-04-09 DIAGNOSIS — Z87442 Personal history of urinary calculi: Secondary | ICD-10-CM

## 2024-04-09 DIAGNOSIS — Z713 Dietary counseling and surveillance: Secondary | ICD-10-CM

## 2024-04-09 DIAGNOSIS — Z96653 Presence of artificial knee joint, bilateral: Secondary | ICD-10-CM | POA: Diagnosis present

## 2024-04-09 DIAGNOSIS — Z91030 Bee allergy status: Secondary | ICD-10-CM

## 2024-04-09 DIAGNOSIS — Z8249 Family history of ischemic heart disease and other diseases of the circulatory system: Secondary | ICD-10-CM | POA: Diagnosis not present

## 2024-04-09 DIAGNOSIS — B370 Candidal stomatitis: Secondary | ICD-10-CM | POA: Diagnosis present

## 2024-04-09 DIAGNOSIS — E876 Hypokalemia: Secondary | ICD-10-CM | POA: Diagnosis present

## 2024-04-09 DIAGNOSIS — E669 Obesity, unspecified: Secondary | ICD-10-CM | POA: Diagnosis present

## 2024-04-09 DIAGNOSIS — R112 Nausea with vomiting, unspecified: Secondary | ICD-10-CM | POA: Diagnosis present

## 2024-04-09 DIAGNOSIS — Z79890 Hormone replacement therapy (postmenopausal): Secondary | ICD-10-CM | POA: Diagnosis not present

## 2024-04-09 DIAGNOSIS — Z833 Family history of diabetes mellitus: Secondary | ICD-10-CM

## 2024-04-09 DIAGNOSIS — N136 Pyonephrosis: Secondary | ICD-10-CM | POA: Diagnosis present

## 2024-04-09 DIAGNOSIS — E66812 Obesity, class 2: Secondary | ICD-10-CM | POA: Diagnosis present

## 2024-04-09 DIAGNOSIS — N12 Tubulo-interstitial nephritis, not specified as acute or chronic: Principal | ICD-10-CM

## 2024-04-09 DIAGNOSIS — Z96611 Presence of right artificial shoulder joint: Secondary | ICD-10-CM | POA: Diagnosis present

## 2024-04-09 DIAGNOSIS — N1370 Vesicoureteral-reflux, unspecified: Secondary | ICD-10-CM | POA: Diagnosis present

## 2024-04-09 DIAGNOSIS — Z8261 Family history of arthritis: Secondary | ICD-10-CM

## 2024-04-09 DIAGNOSIS — N261 Atrophy of kidney (terminal): Secondary | ICD-10-CM | POA: Diagnosis present

## 2024-04-09 DIAGNOSIS — R7303 Prediabetes: Secondary | ICD-10-CM | POA: Diagnosis present

## 2024-04-09 DIAGNOSIS — G4733 Obstructive sleep apnea (adult) (pediatric): Secondary | ICD-10-CM | POA: Diagnosis present

## 2024-04-09 DIAGNOSIS — Z823 Family history of stroke: Secondary | ICD-10-CM

## 2024-04-09 DIAGNOSIS — Z818 Family history of other mental and behavioral disorders: Secondary | ICD-10-CM

## 2024-04-09 LAB — CBC WITH DIFFERENTIAL/PLATELET
Abs Immature Granulocytes: 0.12 10*3/uL — ABNORMAL HIGH (ref 0.00–0.07)
Basophils Absolute: 0.1 10*3/uL (ref 0.0–0.1)
Basophils Relative: 1 %
Eosinophils Absolute: 0.1 10*3/uL (ref 0.0–0.5)
Eosinophils Relative: 1 %
HCT: 33.1 % — ABNORMAL LOW (ref 36.0–46.0)
Hemoglobin: 10.5 g/dL — ABNORMAL LOW (ref 12.0–15.0)
Immature Granulocytes: 2 %
Lymphocytes Relative: 15 %
Lymphs Abs: 1.1 10*3/uL (ref 0.7–4.0)
MCH: 27.6 pg (ref 26.0–34.0)
MCHC: 31.7 g/dL (ref 30.0–36.0)
MCV: 87.1 fL (ref 80.0–100.0)
Monocytes Absolute: 1.3 10*3/uL — ABNORMAL HIGH (ref 0.1–1.0)
Monocytes Relative: 17 %
Neutro Abs: 4.9 10*3/uL (ref 1.7–7.7)
Neutrophils Relative %: 64 %
Platelets: 436 10*3/uL — ABNORMAL HIGH (ref 150–400)
RBC: 3.8 MIL/uL — ABNORMAL LOW (ref 3.87–5.11)
RDW: 15 % (ref 11.5–15.5)
WBC: 7.5 10*3/uL (ref 4.0–10.5)
nRBC: 0 % (ref 0.0–0.2)

## 2024-04-09 LAB — IRON AND TIBC
Iron: 14 ug/dL — ABNORMAL LOW (ref 28–170)
Saturation Ratios: 5 % — ABNORMAL LOW (ref 10.4–31.8)
TIBC: 272 ug/dL (ref 250–450)
UIBC: 258 ug/dL

## 2024-04-09 LAB — COMPREHENSIVE METABOLIC PANEL WITH GFR
ALT: 23 U/L (ref 0–44)
AST: 30 U/L (ref 15–41)
Albumin: 3 g/dL — ABNORMAL LOW (ref 3.5–5.0)
Alkaline Phosphatase: 158 U/L — ABNORMAL HIGH (ref 38–126)
Anion gap: 11 (ref 5–15)
BUN: 8 mg/dL (ref 6–20)
CO2: 28 mmol/L (ref 22–32)
Calcium: 9 mg/dL (ref 8.9–10.3)
Chloride: 98 mmol/L (ref 98–111)
Creatinine, Ser: 0.65 mg/dL (ref 0.44–1.00)
GFR, Estimated: >60 mL/min (ref >60)
Glucose, Bld: 120 mg/dL — ABNORMAL HIGH (ref 70–99)
Potassium: 2.8 mmol/L — ABNORMAL LOW (ref 3.5–5.1)
Sodium: 137 mmol/L (ref 135–145)
Total Bilirubin: 0.7 mg/dL (ref 0.0–1.2)
Total Protein: 7.2 g/dL (ref 6.5–8.1)

## 2024-04-09 LAB — URINALYSIS, W/ REFLEX TO CULTURE (INFECTION SUSPECTED)
Bilirubin Urine: NEGATIVE
Glucose, UA: NEGATIVE mg/dL
Ketones, ur: 5 mg/dL — AB
Nitrite: NEGATIVE
Protein, ur: 100 mg/dL — AB
Specific Gravity, Urine: 1.017 (ref 1.005–1.030)
WBC, UA: >50 WBC/hpf (ref 0–5)
pH: 6 (ref 5.0–8.0)

## 2024-04-09 LAB — FOLATE: Folate: 13.2 ng/mL (ref >5.9)

## 2024-04-09 LAB — VITAMIN B12: Vitamin B-12: 1003 pg/mL — ABNORMAL HIGH (ref 180–914)

## 2024-04-09 LAB — LIPASE, BLOOD: Lipase: 19 U/L (ref 11–51)

## 2024-04-09 LAB — HIV ANTIBODY (ROUTINE TESTING W REFLEX): HIV Screen 4th Generation wRfx: NONREACTIVE

## 2024-04-09 LAB — FERRITIN: Ferritin: 216 ng/mL (ref 11–307)

## 2024-04-09 LAB — MAGNESIUM: Magnesium: 1.8 mg/dL (ref 1.7–2.4)

## 2024-04-09 LAB — LACTIC ACID, PLASMA
Lactic Acid, Venous: 1.6 mmol/L (ref 0.5–1.9)
Lactic Acid, Venous: 1.4 mmol/L (ref 0.5–1.9)

## 2024-04-09 MED ORDER — POTASSIUM CHLORIDE 10 MEQ/100ML IV SOLN
10.0000 meq | INTRAVENOUS | Status: AC
  Administered 2024-04-09 (×2): 10 meq via INTRAVENOUS
  Filled 2024-04-09 (×2): qty 100

## 2024-04-09 MED ORDER — KETOROLAC TROMETHAMINE 15 MG/ML IJ SOLN
15.0000 mg | Freq: Once | INTRAMUSCULAR | Status: AC
  Administered 2024-04-09: 15 mg via INTRAVENOUS
  Filled 2024-04-09: qty 1

## 2024-04-09 MED ORDER — LEVOTHYROXINE SODIUM 125 MCG PO TABS
125.0000 ug | ORAL_TABLET | Freq: Every day | ORAL | Status: DC
  Administered 2024-04-10 – 2024-04-17 (×8): 125 ug via ORAL
  Filled 2024-04-09 (×8): qty 1

## 2024-04-09 MED ORDER — ACETAMINOPHEN 650 MG RE SUPP: 650.0000 mg | Freq: Four times a day (QID) | RECTAL | Status: DC | PRN

## 2024-04-09 MED ORDER — PANTOPRAZOLE SODIUM 40 MG PO TBEC
80.0000 mg | DELAYED_RELEASE_TABLET | Freq: Every day | ORAL | Status: DC
  Administered 2024-04-10 – 2024-04-17 (×8): 80 mg via ORAL
  Filled 2024-04-09 (×8): qty 2

## 2024-04-09 MED ORDER — FLUOXETINE HCL 20 MG PO CAPS
40.0000 mg | ORAL_CAPSULE | Freq: Every day | ORAL | Status: DC
  Administered 2024-04-10 – 2024-04-17 (×8): 40 mg via ORAL
  Filled 2024-04-09 (×8): qty 2

## 2024-04-09 MED ORDER — ONDANSETRON HCL 4 MG/2ML IJ SOLN
4.0000 mg | Freq: Once | INTRAMUSCULAR | Status: AC
Start: 2024-04-09 — End: 2024-04-09
  Administered 2024-04-09: 4 mg via INTRAVENOUS
  Filled 2024-04-09: qty 2

## 2024-04-09 MED ORDER — HYDROXYZINE HCL 25 MG PO TABS
25.0000 mg | ORAL_TABLET | Freq: Four times a day (QID) | ORAL | Status: DC
  Administered 2024-04-09 – 2024-04-17 (×30): 25 mg via ORAL
  Filled 2024-04-09 (×30): qty 1

## 2024-04-09 MED ORDER — BUPROPION HCL ER (XL) 300 MG PO TB24
300.0000 mg | ORAL_TABLET | Freq: Every morning | ORAL | Status: DC
  Administered 2024-04-10 – 2024-04-17 (×8): 300 mg via ORAL
  Filled 2024-04-09 (×8): qty 1

## 2024-04-09 MED ORDER — SODIUM CHLORIDE 0.9 % IV BOLUS
1000.0000 mL | Freq: Once | INTRAVENOUS | Status: AC
  Administered 2024-04-09: 1000 mL via INTRAVENOUS

## 2024-04-09 MED ORDER — LOSARTAN POTASSIUM 25 MG PO TABS
25.0000 mg | ORAL_TABLET | Freq: Every day | ORAL | Status: DC
  Administered 2024-04-10 – 2024-04-17 (×8): 25 mg via ORAL
  Filled 2024-04-09 (×8): qty 1

## 2024-04-09 MED ORDER — SODIUM CHLORIDE 0.9 % IV SOLN
2.0000 g | Freq: Once | INTRAVENOUS | Status: AC
  Administered 2024-04-09: 2 g via INTRAVENOUS
  Filled 2024-04-09: qty 20

## 2024-04-09 MED ORDER — POTASSIUM CHLORIDE CRYS ER 20 MEQ PO TBCR
40.0000 meq | EXTENDED_RELEASE_TABLET | ORAL | Status: AC
  Administered 2024-04-09 (×2): 40 meq via ORAL
  Filled 2024-04-09 (×2): qty 2

## 2024-04-09 MED ORDER — MORPHINE SULFATE (PF) 4 MG/ML IV SOLN
4.0000 mg | Freq: Once | INTRAVENOUS | Status: AC
  Administered 2024-04-09: 4 mg via INTRAVENOUS
  Filled 2024-04-09: qty 1

## 2024-04-09 MED ORDER — MORPHINE SULFATE (PF) 2 MG/ML IV SOLN: 2.0000 mg | INTRAVENOUS | Status: DC | PRN

## 2024-04-09 MED ORDER — IOHEXOL 300 MG/ML  SOLN
100.0000 mL | Freq: Once | INTRAMUSCULAR | Status: AC | PRN
Start: 2024-04-09 — End: 2024-04-09
  Administered 2024-04-09: 100 mL via INTRAVENOUS

## 2024-04-09 MED ORDER — ENOXAPARIN SODIUM 40 MG/0.4ML IJ SOSY
40.0000 mg | PREFILLED_SYRINGE | INTRAMUSCULAR | Status: DC
  Administered 2024-04-09 – 2024-04-16 (×8): 40 mg via SUBCUTANEOUS
  Filled 2024-04-09 (×8): qty 0.4

## 2024-04-09 MED ORDER — DICYCLOMINE HCL 10 MG PO CAPS
10.0000 mg | ORAL_CAPSULE | Freq: Three times a day (TID) | ORAL | Status: DC
  Administered 2024-04-09 – 2024-04-16 (×29): 10 mg via ORAL
  Filled 2024-04-09 (×29): qty 1

## 2024-04-09 MED ORDER — FAMOTIDINE 20 MG PO TABS
20.0000 mg | ORAL_TABLET | Freq: Two times a day (BID) | ORAL | Status: DC
  Administered 2024-04-09 – 2024-04-17 (×16): 20 mg via ORAL
  Filled 2024-04-09 (×16): qty 1

## 2024-04-09 MED ORDER — BUSPIRONE HCL 5 MG PO TABS
5.0000 mg | ORAL_TABLET | Freq: Two times a day (BID) | ORAL | Status: DC
  Administered 2024-04-09 – 2024-04-17 (×16): 5 mg via ORAL
  Filled 2024-04-09 (×16): qty 1

## 2024-04-09 MED ORDER — ROSUVASTATIN CALCIUM 10 MG PO TABS
10.0000 mg | ORAL_TABLET | Freq: Every day | ORAL | Status: DC
  Administered 2024-04-10 – 2024-04-17 (×8): 10 mg via ORAL
  Filled 2024-04-09 (×8): qty 1

## 2024-04-09 MED ORDER — ACETAMINOPHEN 325 MG PO TABS
650.0000 mg | ORAL_TABLET | Freq: Four times a day (QID) | ORAL | Status: DC | PRN
Start: 2024-04-09 — End: 2024-04-17
  Administered 2024-04-11 – 2024-04-17 (×4): 650 mg via ORAL
  Filled 2024-04-09 (×5): qty 2

## 2024-04-09 MED ORDER — ONDANSETRON HCL 4 MG/2ML IJ SOLN: 4.0000 mg | Freq: Four times a day (QID) | INTRAMUSCULAR | Status: DC | PRN

## 2024-04-09 MED ORDER — SODIUM CHLORIDE 0.9 % IV SOLN
2.0000 g | INTRAVENOUS | Status: DC
  Administered 2024-04-10 – 2024-04-17 (×8): 2 g via INTRAVENOUS
  Filled 2024-04-09 (×8): qty 20

## 2024-04-09 NOTE — Consult Note (Addendum)
 I have been asked to see the patient by Dr. Sueellen Emery, for evaluation and management of left pyelonephritis.  History of present illness: 58 year old female presented to the [facility] emergency department with persistent left-sided flank pain and fever at home.  She was seen 6 days prior: With acute onset left-sided flank pain and lightheadedness with difficulty urinating.  She is found to have a Klebsiella UTI.  She was started on Keflex , and has been on Keflex  since earlier today.  The patient states that earlier in the week she had fevers up to 101.9.  Her pain has persisted.  Today her fever was 99.  She had enough of the pain, and again presented to the emergency department.  However, she does admit that the pain is not worse today than it was yesterday and that maybe it might be getting slightly better.  The patient also notes that she was having nausea, but she is on a dietary drug that creates nausea and she is not sure if it is because of the kidney or because of the drug that she is having nausea.  The patient has a history of a vesicoureteral reflux.  At the age of 2 she had a fever of 106.  She subsequently had what sounds like a reimplant as a kid.  She was then lost to follow-up until middle-age.  She presented to [facility] at some point within the last 10 years with pain.  She states that she has had this similar episode 5 times in the last 10 years, but this 1 seems to be the worst.  Based on investigation of outside reports from [facility], findings of end-stage left renal atrophy and left hydronephrosis have been reported previously such as on 09/10/2015 CT abdomen, and a nuclear medicine renogram performed on 05/22/2018  showed 99% of relative renal mass on the right side.    Review of systems: A 12 point comprehensive review of systems was obtained and is negative unless otherwise stated in the history of present illness.  Patient Active Problem List   Diagnosis Date  Noted   Acute pyelonephritis 04/09/2024   Status post reverse total arthroplasty of right shoulder 02/15/2023   Bilateral chronic knee pain 11/09/2022   Depression 09/19/2022   DOE (dyspnea on exertion) 08/18/2022   Allergic rhinitis 08/18/2022   Class 3 severe obesity with serious comorbidity and body mass index (BMI) of 45.0 to 49.9 in adult 08/18/2022   S/P lumbar fusion 05/09/2022   Lumbar spine pain 03/08/2022   Insulin  resistance 01/11/2022   SOB (shortness of breath) 01/11/2022   Hyperkalemia 08/11/2021   Major depressive disorder, recurrent episode, mild with anxious distress (HCC) 06/21/2021   Lumbar radiculopathy 06/01/2021   Other hyperlipidemia 06/01/2021   Hypothyroidism 11/30/2020   Prediabetes 11/30/2020   Constipated    DDD (degenerative disc disease), cervical 08/09/2018   Neck pain 07/21/2018   Chronic back pain 04/03/2018   Spondylolisthesis 04/03/2018   Osteoarthritis of right knee 02/23/2018   Osteoarthritis of left knee 02/05/2018   Acquired hypothyroidism 12/13/2016   History of systemic reaction to bee sting 12/13/2016   Atrophy of left kidney 09/10/2015   Hydronephrosis of right kidney 09/10/2015   Obstructive sleep apnea syndrome, mild 08/05/2014   Expected blood loss anemia 11/06/2012   Obese 11/06/2012   S/P right UKR 11/05/2012   Major depression, recurrent, chronic (HCC) 02/08/2011   Vitamin D  deficiency 04/30/2010   Diaphragmatic hernia 09/11/2007    No current facility-administered  medications on file prior to encounter.   Current Outpatient Medications on File Prior to Encounter  Medication Sig Dispense Refill   Ascorbic Acid (VITAMIN C) 1000 MG tablet Take 3,000 mg by mouth at bedtime.     aspirin  81 MG chewable tablet Chew 81 mg by mouth.     BREYNA 80-4.5 MCG/ACT inhaler Inhale 2 puffs into the lungs daily as needed.     buPROPion  (WELLBUTRIN  XL) 300 MG 24 hr tablet Take 1 tablet by mouth every morning.     busPIRone  (BUSPAR ) 5 MG  tablet Take 5 mg by mouth 2 (two) times daily as needed for anxiety.     cephALEXin  (KEFLEX ) 500 MG capsule Take 1 capsule (500 mg total) by mouth 2 (two) times daily for 7 days. 14 capsule 0   chlorpheniramine (CHLOR-TRIMETON) 4 MG tablet Take 4 mg by mouth daily.     cholecalciferol (VITAMIN D3) 25 MCG (1000 UNIT) tablet Take 1,000 Units by mouth daily.     ciprofloxacin (CIPRO) 500 MG tablet Take 500 mg by mouth 2 (two) times daily.     dicyclomine (BENTYL) 10 MG capsule Take 10 mg by mouth in the morning, at noon, in the evening, and at bedtime.     diphenhydrAMINE  (BENADRYL ) 25 mg capsule Take 1 capsule (25 mg total) by mouth every 6 (six) hours as needed for itching, allergies or sleep. 30 capsule    EPINEPHrine  0.3 mg/0.3 mL IJ SOAJ injection Inject 0.3 mg into the muscle as needed for anaphylaxis. 1 each 0   famotidine  (PEPCID ) 20 MG tablet Take 1 tablet by mouth 2 (two) times daily.     FLUoxetine  (PROZAC ) 40 MG capsule Take 40 mg by mouth daily.     gabapentin  (NEURONTIN ) 100 MG capsule Take 1 capsule (100 mg total) by mouth 3 (three) times daily. For pain 90 capsule 0   hydrOXYzine  (ATARAX ) 25 MG tablet Take 1 tablet by mouth every 6 (six) hours.     levothyroxine  (SYNTHROID ) 125 MCG tablet Take 125 mcg by mouth daily before breakfast.     losartan  (COZAAR ) 25 MG tablet Take 1 tablet by mouth daily.     meclizine  (ANTIVERT ) 25 MG tablet Take 25 mg by mouth 3 (three) times daily as needed for dizziness.     metFORMIN  (GLUCOPHAGE ) 500 MG tablet 1 tab at lunch 1 tab at dinner 180 tablet 0   metoCLOPramide  (REGLAN ) 10 MG tablet Take 10 mg by mouth.     metroNIDAZOLE (FLAGYL) 500 MG tablet Take 500 mg by mouth 3 (three) times daily.     montelukast  (SINGULAIR ) 10 MG tablet Take 10 mg by mouth at bedtime.     mupirocin ointment (BACTROBAN) 2 % Apply 1 Application topically daily as needed (irritation).     Naphazoline-Pheniramine ([employer] EYE ALLERGY RELIEF OP) Place 1 drop into both eyes daily  as needed (allergies).     omeprazole (PRILOSEC) 40 MG capsule Take 40 mg by mouth at bedtime.      rosuvastatin (CRESTOR) 10 MG tablet Take 10 mg by mouth.     tamsulosin  (FLOMAX ) 0.4 MG CAPS capsule Take 1 capsule (0.4 mg total) by mouth daily. 30 capsule 0   tirzepatide  (ZEPBOUND ) 7.5 MG/0.5ML Pen Inject 7.5 mg into the skin once a week. 6 mL 0   triamcinolone  cream (KENALOG ) 0.5 % Apply 1 Application topically daily as needed (irritation).     UNABLE TO FIND Inhale 2 puffs into the lungs 2 (two) times daily.  Past Medical History:  Diagnosis Date   Allergic rhinitis    Anxiety    Arthritis    Back pain    Constipated    Depression    Diaphragmatic hernia    Fatty liver    GERD (gastroesophageal reflux disease)    History of kidney stones    Hypertension    Hypothyroidism    IBS (irritable bowel syndrome)    Joint pain    Kidney problem    one functioning kidney   Lactose intolerance    Morbid obesity (HCC)    OSA (obstructive sleep apnea)    Osteoarthritis    Pre-diabetes    Seasonal allergies    Tinnitus    Vertigo    Vitamin D  deficiency     Past Surgical History:  Procedure Laterality Date   ABDOMINAL EXPOSURE N/A 05/09/2022   Procedure: ABDOMINAL EXPOSURE;  Surgeon: Young Hensen, MD;  Location: [facility] OR;  Service: Vascular;  Laterality: N/A;   CARPAL TUNNEL RELEASE     rt/lt   COLONOSCOPY     FINGER ARTHRODESIS  11/08/2011   Procedure: ARTHRODESIS FINGER;  Surgeon: Amelie Baize., MD;  Location: [facility];  Service: Orthopedics;  Laterality: Left;  left thumb carpal-metacarpal fusion   KNEE ARTHROSCOPY     both knees   OBLIQUE LUMBAR INTERBODY FUSION 1 LEVEL WITH PERCUTANEOUS SCREWS N/A 05/09/2022   Procedure: OBLIQUE LUMBAR INTERBODY FUSION 1 LEVEL WITH PERCUTANEOUS SCREWS (OLIF L4-5 WITH POSTERIOR SPINAL FUSION INTERBODY);  Surgeon: Mort Ards, MD;  Location: [facility] OR;  Service: Orthopedics;  Laterality: N/A;  4 hrs Dr. Fulton Job  to do approach Left tap block with exparel    PARTIAL KNEE ARTHROPLASTY  11/05/2012   Procedure: UNICOMPARTMENTAL KNEE;  Surgeon: Bevin Bucks, MD;  Location: [facility] ORS;  Service: Orthopedics;  Laterality: Right;   TONSILLECTOMY     TOTAL SHOULDER ARTHROPLASTY Right 06/25/2020   Procedure: TOTAL SHOULDER ARTHROPLASTY;  Surgeon: Janeth Medicus, MD;  Location: [facility] OR[facility];  Service: Orthopedics;  Laterality: Right;  2.5 hrs   TOTAL SHOULDER REVISION Right 02/15/2023   Procedure: TOTAL SHOULDER REVISION;  Surgeon: Micheline Ahr, MD;  Location: [facility] OR[facility];  Service: Orthopedics;  Laterality: Right;   URETER SURGERY     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-little lt kidney function, has one functioning kidney   WISDOM TOOTH EXTRACTION      Social History   Tobacco Use   Smoking status: Never   Smokeless tobacco: Never  Vaping Use   Vaping status: Never Used  Substance Use Topics   Alcohol use: No    Comment: quit 17 yrs ago   Drug use: Never    Family History  Problem Relation Age of Onset   Arthritis Mother        spianl stenosis   Atrial fibrillation Mother    Transient ischemic attack Mother    High Cholesterol Mother    Diabetes Father    Heart attack Father        3 stents   Heart disease Father    Atrial fibrillation Sister    Depression Sister    Transient ischemic attack Maternal Aunt    Stroke Maternal Grandmother    Diabetes Paternal Grandmother     PE: Vitals:   04/09/24 1042 04/09/24 1513 04/09/24 1515  BP: (!) 163/81  (!) 166/68  Pulse: 99 87 87  Resp: 16  16  Temp: 99.2 F (37.3 C) 98.6 F (37 C)   TempSrc:  Oral Oral   SpO2: 96% 100% 99%   Patient appears to be in no acute distress, morbidly obese patient is alert and oriented x3 Atraumatic normocephalic head No cervical or supraclavicular lymphadenopathy appreciated No increased work of breathing, no audible wheezes/rhonchi Regular sinus rhythm/rate Abdomen is soft, nondistended, diffuse  left-sided tenderness Lower extremities are symmetric without appreciable edema Grossly neurologically intact No identifiable skin lesions  Recent Labs    04/09/24 1205  WBC 7.5  HGB 10.5*  HCT 33.1*   Recent Labs    04/09/24 1205  NA 137  K 2.8*  CL 98  CO2 28  GLUCOSE 120*  BUN 8  CREATININE 0.65  CALCIUM 9.0   No results for input(s): LABPT, INR in the last 72 hours. No results for input(s): LABURIN in the last 72 hours. Results for orders placed or performed during the hospital encounter of 04/03/24  Urine Culture     Status: Abnormal   Collection Time: 04/03/24  2:50 PM   Specimen: Urine, Clean Catch  Result Value Ref Range Status   Specimen Description   Final    URINE, CLEAN CATCH Performed at [facility], [address], [place], [place] [address]    Special Requests   Final    NONE Performed at [facility], [address] [place], [place] [address]    Culture >=100,000 COLONIES/mL KLEBSIELLA PNEUMONIAE (A)  Final   Report Status 04/05/2024 FINAL  Final   Organism ID, Bacteria KLEBSIELLA PNEUMONIAE (A)  Final      Susceptibility   Klebsiella pneumoniae - MIC*    AMPICILLIN >=32 RESISTANT Resistant     CEFAZOLIN  <=4 SENSITIVE Sensitive     CEFEPIME <=0.12 SENSITIVE Sensitive     CEFTRIAXONE  <=0.25 SENSITIVE Sensitive     CIPROFLOXACIN 0.5 INTERMEDIATE Intermediate     GENTAMICIN <=1 SENSITIVE Sensitive     IMIPENEM <=0.25 SENSITIVE Sensitive     NITROFURANTOIN 64 INTERMEDIATE Intermediate     TRIMETH/SULFA >=320 RESISTANT Resistant     AMPICILLIN/SULBACTAM 16 INTERMEDIATE Intermediate     PIP/TAZO <=4 SENSITIVE Sensitive ug/mL    * >=100,000 COLONIES/mL KLEBSIELLA PNEUMONIAE    Imaging: I reviewed the patient's imaging including the CT scan performed 1 week ago and then again today in the emergency department which demonstrates a severely atrophic and very dilated kidney with some perinephric  stranding.  Imp: The patient has left-sided pyelonephritis then nonfunctioning freely refluxing left kidney, although I think that she was actually getting better on the Keflex , but her symptoms persisted so long that she could no longer take it because of her poorly controlled pain.  Recommendations:  The patient's now been placed on IV antibiotics.  I suspect that over the next several days she will continue to defervesce and her pain will ultimately improved.  I would treat her for total of 14 days from her initial treatment date.  I then recommended that we put her on suppression antibiotics at a low dose (possibly cephalexin  250 mg nightly x 90 days), which we will help her with this as we follow-up with her in clinic.  The patient fails to improve I would Foley catheter in since the etiology of her renal failure is most likely vesicoureteral reflux based on her history.  As a last resort I would consider a nephrostomy tube.  In addition, I think it be reasonable to treat her with Toradol  as this would help significantly the inflammation and the associated pain with her left pyelonephritis.  Her renal function otherwise appears to be quite good and she can definitely tolerate the medication.  I will order 1 dose, if primary team agrees to continue with it, they can order.  Ultimately, the patient does need her left kidney removed.  This would be a laparoscopic surgery.  However, she has 2 orthopedic surgeries pending for August and September.  I think is reasonable to keep her on suppression antibiotics through these 2 surgeries, and then plan to do her surgery in either November or December.   Andrez Banker

## 2024-04-09 NOTE — ED Provider Notes (Signed)
 [place] EMERGENCY DEPARTMENT AT [facility] Provider Note   CSN: [id number] Arrival date & time: 04/09/24  1037     Patient presents with: Flank Pain (Pt presents today from PCP for concerns of pylonephritis. Pt was seen at ER twice last week for flank pain given CT scan and given PO atbx. Sent here for further workup )   Melissa [name] is a 58 y.o. female.   Pt is a 58 yo female with pmhx significant for hypothyroidism, GERD, anxiety, depression, IBS, obesity, HTN, kidney stones, and left renal atrophy.  Pt was here on 6/11 for left flank pain and was diagnosed with pyelonephritis.  Pt was given IV Rocephin  and was d/c with keflex .  Urine cx sensitive to keflex .  She continues to have left flank pain and went to PCP today.  Pt's PCP was concerned that she has active pyelo and sent her here for iv abx and urology consult.  Pt denies f/c, but has been taking tylenol  for pain.  Pt has not seen urology for several years.       Prior to Admission medications   Medication Sig Start Date End Date Taking? Authorizing Provider  Ascorbic Acid (VITAMIN C) 1000 MG tablet Take 3,000 mg by mouth at bedtime.    [provider]  aspirin  81 MG chewable tablet Chew 81 mg by mouth. 06/09/23 06/08/24  [provider]  BREYNA 80-4.5 MCG/ACT inhaler Inhale 2 puffs into the lungs daily as needed.    [provider]  buPROPion  (WELLBUTRIN  XL) 300 MG 24 hr tablet Take 1 tablet by mouth every morning. 03/04/24   [provider]  busPIRone  (BUSPAR ) 5 MG tablet Take 5 mg by mouth 2 (two) times daily as needed for anxiety. 06/15/20   [provider]  cephALEXin  (KEFLEX ) 500 MG capsule Take 1 capsule (500 mg total) by mouth 2 (two) times daily for 7 days. 04/03/24 04/10/24  Soto, Johana, PA-C  chlorpheniramine (CHLOR-TRIMETON) 4 MG tablet Take 4 mg by mouth daily.    [provider]  cholecalciferol (VITAMIN D3) 25 MCG (1000 UNIT) tablet Take 1,000  Units by mouth daily.    [provider]  ciprofloxacin (CIPRO) 500 MG tablet Take 500 mg by mouth 2 (two) times daily. 04/05/24 04/15/24  [provider]  dicyclomine (BENTYL) 10 MG capsule Take 10 mg by mouth in the morning, at noon, in the evening, and at bedtime. 04/05/24   [provider]  diphenhydrAMINE  (BENADRYL ) 25 mg capsule Take 1 capsule (25 mg total) by mouth every 6 (six) hours as needed for itching, allergies or sleep. 11/06/12   Equilla Hastings, PA-C  EPINEPHrine  0.3 mg/0.3 mL IJ SOAJ injection Inject 0.3 mg into the muscle as needed for anaphylaxis. 02/20/24   Danford, Acie Holiday D, NP  famotidine  (PEPCID ) 20 MG tablet Take 1 tablet by mouth 2 (two) times daily.    [provider]  FLUoxetine  (PROZAC ) 40 MG capsule Take 40 mg by mouth daily.    [provider]  gabapentin  (NEURONTIN ) 100 MG capsule Take 1 capsule (100 mg total) by mouth 3 (three) times daily. For pain 02/16/23   Adine Ahmadi, PA-C  hydrOXYzine  (ATARAX ) 25 MG tablet Take 1 tablet by mouth every 6 (six) hours.    [provider]  levothyroxine  (SYNTHROID ) 125 MCG tablet Take 125 mcg by mouth daily before breakfast.    [provider]  losartan  (COZAAR ) 25 MG tablet Take 1 tablet by mouth daily.  [provider]  meclizine  (ANTIVERT ) 25 MG tablet Take 25 mg by mouth 3 (three) times daily as needed for dizziness.    [provider]  metFORMIN  (GLUCOPHAGE ) 500 MG tablet 1 tab at lunch 1 tab at dinner 02/20/24   Danford, Acie Holiday D, NP  metoCLOPramide  (REGLAN ) 10 MG tablet Take 10 mg by mouth. 03/26/24   [provider]  metroNIDAZOLE (FLAGYL) 500 MG tablet Take 500 mg by mouth 3 (three) times daily. 04/05/24 04/15/24  [provider]  montelukast  (SINGULAIR ) 10 MG tablet Take 10 mg by mouth at bedtime.    [provider]  mupirocin ointment (BACTROBAN) 2 % Apply 1 Application topically daily as needed (irritation).    [provider]  Naphazoline-Pheniramine ([employer] EYE ALLERGY RELIEF OP) Place 1 drop into both eyes daily as needed (allergies).    [provider]  omeprazole (PRILOSEC) 40 MG capsule Take 40 mg by mouth at bedtime.     [provider]  rosuvastatin (CRESTOR) 10 MG tablet Take 10 mg by mouth. 03/26/24   [provider]  tamsulosin  (FLOMAX ) 0.4 MG CAPS capsule Take 1 capsule (0.4 mg total) by mouth daily. 04/03/24   Palumbo, April, MD  tirzepatide  (ZEPBOUND ) 7.5 MG/0.5ML Pen Inject 7.5 mg into the skin once a week. 02/20/24   Danford, Acie Holiday D, NP  triamcinolone  cream (KENALOG ) 0.5 % Apply 1 Application topically daily as needed (irritation).    [provider]  UNABLE TO FIND Inhale 2 puffs into the lungs 2 (two) times daily.    [provider]    Allergies: Bee venom and Lactose intolerance (gi)    Review of Systems  Genitourinary:  Positive for flank pain.  All other systems reviewed and are negative.   Updated Vital Signs BP (!) 166/68   Pulse 87   Temp 98.6 F (37 C) (Oral)   Resp 16   LMP 10/12/2012   SpO2 99%   Physical Exam Vitals and nursing note reviewed.  Constitutional:      Appearance: Normal appearance.  HENT:     Head: Normocephalic and atraumatic.     Right Ear: External ear normal.     Left Ear: External ear normal.     Nose: Nose normal.     Mouth/Throat:     Mouth: Mucous membranes are dry.   Eyes:     Extraocular Movements: Extraocular movements intact.     Conjunctiva/sclera: Conjunctivae normal.     Pupils: Pupils are equal, round, and reactive to light.    Cardiovascular:     Rate and Rhythm: Normal rate and regular rhythm.     Pulses: Normal pulses.     Heart sounds: Normal heart sounds.  Pulmonary:     Effort: Pulmonary effort is normal.     Breath sounds: Normal breath sounds.  Abdominal:     General: Abdomen is flat.     Palpations: Abdomen is soft.     Tenderness: There is left CVA tenderness.    Musculoskeletal:        General: Normal range of motion.     Cervical back: Normal range of motion and neck supple.   Skin:    General: Skin is warm.     Capillary Refill: Capillary refill takes less than 2 seconds.   Neurological:     General: No focal deficit present.     Mental Status: She is alert and oriented to person, place, and time.   Psychiatric:  Mood and Affect: Mood normal.        Behavior: Behavior normal.     (all labs ordered are listed, but only abnormal results are displayed) Labs Reviewed  CBC WITH DIFFERENTIAL/PLATELET - Abnormal; Notable for the following components:      Result Value   RBC 3.80 (*)    Hemoglobin 10.5 (*)    HCT 33.1 (*)    Platelets 436 (*)    Monocytes Absolute 1.3 (*)    Abs Immature Granulocytes 0.12 (*)    All other components within normal limits  COMPREHENSIVE METABOLIC PANEL WITH GFR - Abnormal; Notable for the following components:   Potassium 2.8 (*)    Glucose, Bld 120 (*)    Albumin 3.0 (*)    Alkaline Phosphatase 158 (*)    All other components within normal limits  URINALYSIS, W/ REFLEX TO CULTURE (INFECTION SUSPECTED) - Abnormal; Notable for the following components:   APPearance CLOUDY (*)    Hgb urine dipstick SMALL (*)    Ketones, ur 5 (*)    Protein, ur 100 (*)    Leukocytes,Ua LARGE (*)    Bacteria, UA RARE (*)    All other components within normal limits  VITAMIN B12 - Abnormal; Notable for the following components:   Vitamin B-12 1,003 (*)    All other components within normal limits  IRON AND TIBC - Abnormal; Notable for the following components:   Iron 14 (*)    Saturation Ratios 5 (*)    All other components within normal limits  CULTURE, BLOOD (ROUTINE X 2)  CULTURE, BLOOD (ROUTINE X 2)  URINE CULTURE  LIPASE, BLOOD  LACTIC ACID, PLASMA  LACTIC ACID, PLASMA  MAGNESIUM   FOLATE  FERRITIN  RETICULOCYTES    EKG: None  Radiology: CT ABDOMEN PELVIS W CONTRAST Result Date:  04/09/2024 CLINICAL DATA:  Left-sided abdominal pain for 1 week. Pyelonephritis suspected. EXAM: CT ABDOMEN AND PELVIS WITH CONTRAST TECHNIQUE: Multidetector CT imaging of the abdomen and pelvis was performed using the standard protocol following bolus administration of intravenous contrast. RADIATION DOSE REDUCTION: This exam was performed according to the departmental dose-optimization program which includes automated exposure control, adjustment of the mA and/or kV according to patient size and/or use of iterative reconstruction technique. CONTRAST:  100mL OMNIPAQUE IOHEXOL 300 MG/ML  SOLN COMPARISON:  CT of the abdomen and pelvis without contrast 04/03/2024. No other comparison studies. FINDINGS: Lower chest: Mild atelectasis at both lung bases. No confluent airspace disease or significant pleural effusion. Hepatobiliary: The liver is normal in density without suspicious focal abnormality. No evidence of gallstones, gallbladder wall thickening or biliary dilatation. Pancreas: Unremarkable. No pancreatic ductal dilatation or surrounding inflammatory changes. Spleen: Normal in size without focal abnormality. Adrenals/Urinary Tract: Both adrenal glands appear normal. Again demonstrated are findings consistent with chronic severe hydronephrosis on the left with diffuse cortical thinning and no demonstrated contrast excretion. Surrounding soft tissue stranding appears mildly improved from the recent prior study. The left ureter does not appear dilated, and these findings suggest chronic left UPJ obstruction. The right kidney appears normal, without focal mass lesion, urinary tract calculus, hydronephrosis or delayed contrast excretion. The bladder appears unremarkable for its degree of distention. Stomach/Bowel: No enteric contrast administered. The stomach appears unremarkable for its degree of distension. No evidence of bowel wall thickening, distention or surrounding inflammatory change. The appendix appears  normal. Prominent stool throughout the colon. Mild distal colonic diverticular changes without acute inflammation. Vascular/Lymphatic: There are no enlarged abdominal or pelvic lymph  nodes. Aortic and branch vessel atherosclerosis without evidence of aneurysm or large vessel occlusion. Reproductive: The uterus and ovaries appear unremarkable. No adnexal mass. Other: No evidence of abdominal wall mass or hernia. No ascites or pneumoperitoneum. Musculoskeletal: No acute or significant osseous findings. Status post L4-5 OLIF with chronic sacroiliac degenerative changes, greater on the left. IMPRESSION: 1. Findings consistent with chronic severe hydronephrosis on the left with diffuse cortical thinning and no demonstrated contrast excretion. Surrounding soft tissue stranding appears mildly improved from the recent prior study. Findings suggest chronic left UPJ obstruction. See recent prior study which references prior outside imaging, and consider urology follow-up. 2. The right kidney appears normal. 3. No other acute findings. 4.  Aortic Atherosclerosis (ICD10-I70.0). Electronically Signed   By: Elmon Hagedorn M.D.   On: 04/09/2024 15:07     Procedures   Medications Ordered in the ED  potassium chloride  SA (KLOR-CON  M) CR tablet 40 mEq (has no administration in time range)  sodium chloride  0.9 % bolus 1,000 mL (0 mLs Intravenous Stopped 04/09/24 1536)  cefTRIAXone  (ROCEPHIN ) 2 g in sodium chloride  0.9 % 100 mL IVPB (0 g Intravenous Stopped 04/09/24 1244)  morphine  (PF) 4 MG/ML injection 4 mg (4 mg Intravenous Given 04/09/24 1204)  ondansetron  (ZOFRAN ) injection 4 mg (4 mg Intravenous Given 04/09/24 1203)  potassium chloride  10 mEq in 100 mL IVPB (0 mEq Intravenous Stopped 04/09/24 1536)  iohexol (OMNIPAQUE) 300 MG/ML solution 100 mL (100 mLs Intravenous Contrast Given 04/09/24 1306)  morphine  (PF) 4 MG/ML injection 4 mg (4 mg Intravenous Given 04/09/24 1542)                                    Medical  Decision Making Amount and/or Complexity of Data Reviewed Labs: ordered. Radiology: ordered.  Risk Prescription drug management. Decision regarding hospitalization.   This patient presents to the ED for concern of left flank pain, this involves an extensive number of treatment options, and is a complaint that carries with it a high risk of complications and morbidity.  The differential diagnosis includes pyelo, kidney stones, uti, msk   Co morbidities that complicate the patient evaluation  hypothyroidism, GERD, anxiety, depression, IBS, obesity, HTN, kidney stones, and left renal atrophy   Additional history obtained:  Additional history obtained from epic chart review  Lab Tests:  I Ordered, and personally interpreted labs.  The pertinent results include:  cbc with nl wbc, hgb 10.5 (11.4 on 6/11 and 12/29 January 2023); cmp with k low at 2.8, lactic nl, lip nl; UA + for UTI   Imaging Studies ordered:  I ordered imaging studies including ct abd/pelvis  I independently visualized and interpreted imaging which showed   Findings consistent with chronic severe hydronephrosis on the  left with diffuse cortical thinning and no demonstrated contrast  excretion. Surrounding soft tissue stranding appears mildly improved  from the recent prior study. Findings suggest chronic left UPJ  obstruction. See recent prior study which references prior outside  imaging, and consider urology follow-up.  2. The right kidney appears normal.  3. No other acute findings.  4.  Aortic Atherosclerosis (ICD10-I70.0).   I agree with the radiologist interpretation   Cardiac Monitoring:  The patient was maintained on a cardiac monitor.  I personally viewed and interpreted the cardiac monitored which showed an underlying rhythm of: nsr   Medicines ordered and prescription drug management:  I ordered medication including ivfs/morphine /zofran /rocephin   for sx  Reevaluation of the patient after these  medicines showed that the patient improved I have reviewed the patients home medicines and have made adjustments as needed   Test Considered:  ct   Critical Interventions:  Ivfs/iv abx   Consultations Obtained:  I requested consultation with the urologist (Dr. Dulcy Gibney),  and discussed lab and imaging findings as well as pertinent plan -he will see pt in consult. Pt d/w Dr. Wendall Halls ([facility]) for admission.   Problem List / ED Course:  Pyelonephritis with failure of outpatient tx:  pt still has uti and flank pain.  Pt started on iv abx will be admitted to the hospitalist service with urology consult.   Reevaluation:  After the interventions noted above, I reevaluated the patient and found that they have :improved   Social Determinants of Health:  Lives at home   Dispostion:  After consideration of the diagnostic results and the patients response to treatment, I feel that the patent would benefit from admission.       Final diagnoses:  Pyelonephritis  Atrophic kidney    ED Discharge Orders     None          Sueellen Emery, MD 04/09/24 1704

## 2024-04-09 NOTE — H&P (Signed)
 History and Physical    Melissa [name] ZOX:[id number] DOB: 11-28-1965 DOA: 04/09/2024  PCP: Emaline Handsome, MD  Patient coming from: Home  Chief Complaint: Left flank pain   HPI: Melissa [name] is a 58 y.o. female with medical history significant of HTN, IBS, GERD, hypothyroidism, pre-diabetes, congenital left kidney atrophy who presented to the emergency department twice in the last week for same issues.  She presented 6/11 with left-sided flank pain.  She was diagnosed with pyelonephritis and discharged on Keflex .  She presented again 6/11 to the emergency department with left-sided flank pain.  She was given Flomax  and discharged home.  She went to her primary care physician at [facility] earlier today, was instructed to seek care in the emergency department as she had no improvement on oral antibiotics.  She admits to left-sided flank pain, describes it as severe pain as though she is getting stabbed by a burning branding iron.  Also had some fevers at home.  Nausea and vomiting.  She states that she was diagnosed with congenital kidney disease on the left side when she was 58 years old.   She also admits that she has been under significant stress lately due to sudden passing of her friend's adult child.   ED Course: Labs obtained which reveal hypokalemia 2.8.  UA obtained.  CT abdomen pelvis results as below.  Urology consulted.  Patient given IV Rocephin .  Review of Systems: As per HPI. Otherwise, all other review of systems reviewed and are negative.   Past Medical History:  Diagnosis Date   Allergic rhinitis    Anxiety    Arthritis    Back pain    Constipated    Depression    Diaphragmatic hernia    Fatty liver    GERD (gastroesophageal reflux disease)    History of kidney stones    Hypertension    Hypothyroidism    IBS (irritable bowel syndrome)    Joint pain    Kidney problem    one functioning kidney   Lactose intolerance    Morbid obesity (HCC)    OSA (obstructive  sleep apnea)    Osteoarthritis    Pre-diabetes    Seasonal allergies    Tinnitus    Vertigo    Vitamin D  deficiency     Past Surgical History:  Procedure Laterality Date   ABDOMINAL EXPOSURE N/A 05/09/2022   Procedure: ABDOMINAL EXPOSURE;  Surgeon: Young Hensen, MD;  Location: [facility] OR;  Service: Vascular;  Laterality: N/A;   CARPAL TUNNEL RELEASE     rt/lt   COLONOSCOPY     FINGER ARTHRODESIS  11/08/2011   Procedure: ARTHRODESIS FINGER;  Surgeon: Amelie Baize., MD;  Location: [facility];  Service: Orthopedics;  Laterality: Left;  left thumb carpal-metacarpal fusion   KNEE ARTHROSCOPY     both knees   OBLIQUE LUMBAR INTERBODY FUSION 1 LEVEL WITH PERCUTANEOUS SCREWS N/A 05/09/2022   Procedure: OBLIQUE LUMBAR INTERBODY FUSION 1 LEVEL WITH PERCUTANEOUS SCREWS (OLIF L4-5 WITH POSTERIOR SPINAL FUSION INTERBODY);  Surgeon: Mort Ards, MD;  Location: [facility] OR;  Service: Orthopedics;  Laterality: N/A;  4 hrs Dr. Fulton Job to do approach Left tap block with exparel    PARTIAL KNEE ARTHROPLASTY  11/05/2012   Procedure: UNICOMPARTMENTAL KNEE;  Surgeon: Bevin Bucks, MD;  Location: [facility] ORS;  Service: Orthopedics;  Laterality: Right;   TONSILLECTOMY     TOTAL SHOULDER ARTHROPLASTY Right 06/25/2020   Procedure: TOTAL SHOULDER ARTHROPLASTY;  Surgeon: Hiram Lukes,  Arch Ko, MD;  Location: [facility];  Service: Orthopedics;  Laterality: Right;  2.5 hrs   TOTAL SHOULDER REVISION Right 02/15/2023   Procedure: TOTAL SHOULDER REVISION;  Surgeon: Micheline Ahr, MD;  Location: [facility];  Service: Orthopedics;  Laterality: Right;   URETER SURGERY     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 She has never used smokeless tobacco. She reports that she does not drink alcohol and does not use drugs.  Allergies  Allergen Reactions   Bee Venom Hives   Lactose Intolerance (Gi)      Family History  Problem Relation Age of Onset   Arthritis Mother        spianl stenosis   Atrial fibrillation Mother    Transient ischemic attack Mother    High Cholesterol Mother    Diabetes Father    Heart attack Father        3 stents   Heart disease Father    Atrial fibrillation Sister    Depression Sister    Transient ischemic attack Maternal Aunt    Stroke Maternal Grandmother    Diabetes Paternal Grandmother     Prior to Admission medications   Medication Sig Start Date End Date Taking? Authorizing Provider  Ascorbic Acid (VITAMIN C) 1000 MG tablet Take 3,000 mg by mouth at bedtime.    [provider]  aspirin  81 MG chewable tablet Chew 81 mg by mouth. 06/09/23 06/08/24  [provider]  BREYNA 80-4.5 MCG/ACT inhaler Inhale 2 puffs into the lungs daily as needed.    [provider]  buPROPion  (WELLBUTRIN  XL) 300 MG 24 hr tablet Take 1 tablet by mouth every morning. 03/04/24   [provider]  busPIRone  (BUSPAR ) 5 MG tablet Take 5 mg by mouth 2 (two) times daily as needed for anxiety. 06/15/20   [provider]  cephALEXin  (KEFLEX ) 500 MG capsule Take 1 capsule (500 mg total) by mouth 2 (two) times daily for 7 days. 04/03/24 04/10/24  Soto, Johana, PA-C  chlorpheniramine (CHLOR-TRIMETON) 4 MG tablet Take 4 mg by mouth daily.    [provider]  cholecalciferol (VITAMIN D3) 25 MCG (1000 UNIT) tablet Take 1,000 Units by mouth daily.    [provider]  diphenhydrAMINE  (BENADRYL ) 25 mg capsule Take 1 capsule (25 mg total) by mouth every 6 (six) hours as needed for itching, allergies or sleep. 11/06/12   Equilla Hastings, PA-C  EPINEPHrine  0.3 mg/0.3 mL IJ SOAJ injection Inject 0.3 mg into the muscle as needed for anaphylaxis. 02/20/24   Danford, Acie Holiday D, NP  famotidine  (PEPCID ) 20 MG tablet Take 1 tablet by mouth 2 (two) times daily.    [provider]  FLUoxetine  (PROZAC ) 40 MG capsule Take 40 mg by mouth daily.     [provider]  gabapentin  (NEURONTIN ) 100 MG capsule Take 1 capsule (100 mg total) by mouth 3 (three) times daily. For pain 02/16/23   Adine Ahmadi, PA-C  hydrOXYzine  (ATARAX ) 25 MG tablet Take 1 tablet by mouth every 6 (six) hours.    [provider]  levothyroxine  (SYNTHROID ) 150 MCG tablet Take 1 tablet by mouth daily.    [provider]  losartan  (COZAAR ) 25 MG tablet Take 1 tablet by mouth daily.    [provider]  meclizine  (ANTIVERT ) 25 MG tablet Take 25 mg by mouth 3 (three) times daily as  needed for dizziness.    [provider]  metFORMIN  (GLUCOPHAGE ) 500 MG tablet 1 tab at lunch 1 tab at dinner 02/20/24   Danford, Acie Holiday D, NP  metoCLOPramide  (REGLAN ) 10 MG tablet Take 10 mg by mouth. 03/26/24   [provider]  montelukast  (SINGULAIR ) 10 MG tablet Take 10 mg by mouth at bedtime.    [provider]  mupirocin ointment (BACTROBAN) 2 % Apply 1 Application topically daily as needed (irritation).    [provider]  Naphazoline-Pheniramine ([employer] EYE ALLERGY RELIEF OP) Place 1 drop into both eyes daily as needed (allergies).    [provider]  omeprazole (PRILOSEC) 40 MG capsule Take 40 mg by mouth at bedtime.     [provider]  rosuvastatin (CRESTOR) 10 MG tablet Take 10 mg by mouth. 03/26/24   [provider]  tamsulosin  (FLOMAX ) 0.4 MG CAPS capsule Take 1 capsule (0.4 mg total) by mouth daily. 04/03/24   Palumbo, April, MD  tirzepatide  (ZEPBOUND ) 7.5 MG/0.5ML Pen Inject 7.5 mg into the skin once a week. 02/20/24   Danford, Acie Holiday D, NP  triamcinolone  cream (KENALOG ) 0.5 % Apply 1 Application topically daily as needed (irritation).    [provider]  UNABLE TO FIND Inhale 2 puffs into the lungs 2 (two) times daily.    [provider]    Physical Exam: Vitals:   04/09/24 1042 04/09/24 1513 04/09/24 1515  BP: (!) 163/81  (!) 166/68  Pulse: 99 87 87  Resp: 16  16  Temp:  99.2 F (37.3 C) 98.6 F (37 C)   TempSrc: Oral Oral   SpO2: 96% 100% 99%    Constitutional: NAD, calm, comfortable Eyes: PERRL, lids and conjunctivae normal ENMT: Mucous membranes are moist. Normal dentition.  Respiratory: Clear to auscultation bilaterally, no wheezing, no crackles. Normal respiratory effort. No accessory muscle use. No conversational dyspnea  Cardiovascular: Regular rate and rhythm, no murmurs. No extremity edema.  Abdomen: Soft, nondistended, nontender to palpation. Bowel sounds positive.  Musculoskeletal: No joint deformity upper and lower extremities. No contractures. Normal muscle tone.  Skin: no rashes, lesions, ulcers on exposed skin  Neurologic: Alert and oriented, speech fluent, CN 2-12 grossly intact. No focal deficits.   Psychiatric: Normal judgment and insight. Normal mood and affect   Labs on Admission: I have personally reviewed following labs and imaging studies  CBC: Recent Labs  Lab 04/03/24 1450 04/09/24 1205  WBC 13.8* 7.5  NEUTROABS  --  4.9  HGB 11.4* 10.5*  HCT 35.2* 33.1*  MCV 86.3 87.1  PLT 339 436*   Basic Metabolic Panel: Recent Labs  Lab 04/03/24 1450 04/09/24 1205 04/09/24 1326  NA 139 137  --   K 4.0 2.8*  --   CL 106 98  --   CO2 20* 28  --   GLUCOSE 97 120*  --   BUN 18 8  --   CREATININE 0.92 0.65  --   CALCIUM 10.2 9.0  --   MG  --   --  1.8   GFR: Estimated Creatinine Clearance: 80.9 mL/min (by C-G formula based on SCr of 0.65 mg/dL). Liver Function Tests: Recent Labs  Lab 04/03/24 1450 04/09/24 1205  AST 14* 30  ALT 8 23  ALKPHOS 114 158*  BILITOT 0.8 0.7  PROT 7.5 7.2  ALBUMIN 4.2 3.0*   Recent Labs  Lab 04/03/24 1450 04/09/24 1205  LIPASE 17 19   No results for input(s): AMMONIA in the last 168 hours.  Coagulation Profile: No results for input(s): INR, PROTIME in the last 168 hours. Cardiac Enzymes: No results for input(s): CKTOTAL, CKMB, CKMBINDEX, TROPONINI in the last 168  hours. BNP (last 3 results) No results for input(s): PROBNP in the last 8760 hours. HbA1C: No results for input(s): HGBA1C in the last 72 hours. CBG: No results for input(s): GLUCAP in the last 168 hours. Lipid Profile: No results for input(s): CHOL, HDL, LDLCALC, TRIG, CHOLHDL, LDLDIRECT in the last 72 hours. Thyroid  Function Tests: No results for input(s): TSH, T4TOTAL, FREET4, T3FREE, THYROIDAB in the last 72 hours. Anemia Panel: Recent Labs    04/09/24 1300  VITAMINB12 1,003*  FOLATE 13.2  FERRITIN 216  TIBC 272  IRON 14*   Urine analysis:    Component Value Date/Time   COLORURINE YELLOW 04/09/2024 1426   APPEARANCEUR CLOUDY (A) 04/09/2024 1426   LABSPEC 1.017 04/09/2024 1426   PHURINE 6.0 04/09/2024 1426   GLUCOSEU NEGATIVE 04/09/2024 1426   HGBUR SMALL (A) 04/09/2024 1426   BILIRUBINUR NEGATIVE 04/09/2024 1426   KETONESUR 5 (A) 04/09/2024 1426   PROTEINUR 100 (A) 04/09/2024 1426   UROBILINOGEN 0.2 04/11/2015 1830   NITRITE NEGATIVE 04/09/2024 1426   LEUKOCYTESUR LARGE (A) 04/09/2024 1426   Sepsis Labs: !!!!!!!!!!!!!!!!!!!!!!!!!!!!!!!!!!!!!!!!!!!! @LABRCNTIP (procalcitonin:4,lacticidven:4) ) Recent Results (from the past 240 hours)  Urine Culture     Status: Abnormal   Collection Time: 04/03/24  2:50 PM   Specimen: Urine, Clean Catch  Result Value Ref Range Status   Specimen Description   Final    URINE, CLEAN CATCH Performed at [facility], [address], [place], [place] [address]    Special Requests   Final    NONE Performed at [facility], [address] [place], [place] [address]    Culture >=100,000 COLONIES/mL KLEBSIELLA PNEUMONIAE (A)  Final   Report Status 04/05/2024 FINAL  Final   Organism ID, Bacteria KLEBSIELLA PNEUMONIAE (A)  Final      Susceptibility   Klebsiella pneumoniae - MIC*    AMPICILLIN >=32 RESISTANT Resistant     CEFAZOLIN  <=4 SENSITIVE Sensitive     CEFEPIME  <=0.12 SENSITIVE Sensitive     CEFTRIAXONE  <=0.25 SENSITIVE Sensitive     CIPROFLOXACIN 0.5 INTERMEDIATE Intermediate     GENTAMICIN <=1 SENSITIVE Sensitive     IMIPENEM <=0.25 SENSITIVE Sensitive     NITROFURANTOIN 64 INTERMEDIATE Intermediate     TRIMETH/SULFA >=320 RESISTANT Resistant     AMPICILLIN/SULBACTAM 16 INTERMEDIATE Intermediate     PIP/TAZO <=4 SENSITIVE Sensitive ug/mL    * >=100,000 COLONIES/mL KLEBSIELLA PNEUMONIAE     Radiological Exams on Admission: CT ABDOMEN PELVIS W CONTRAST Result Date: 04/09/2024 CLINICAL DATA:  Left-sided abdominal pain for 1 week. Pyelonephritis suspected. EXAM: CT ABDOMEN AND PELVIS WITH CONTRAST TECHNIQUE: Multidetector CT imaging of the abdomen and pelvis was performed using the standard protocol following bolus administration of intravenous contrast. RADIATION DOSE REDUCTION: This exam was performed according to the departmental dose-optimization program which includes automated exposure control, adjustment of the mA and/or kV according to patient size and/or use of iterative reconstruction technique. CONTRAST:  100mL OMNIPAQUE IOHEXOL 300 MG/ML  SOLN COMPARISON:  CT of the abdomen and pelvis without contrast 04/03/2024. No other comparison studies. FINDINGS: Lower chest: Mild atelectasis at both lung bases. No confluent airspace disease or significant pleural effusion. Hepatobiliary: The liver is normal in density without suspicious focal abnormality. No evidence of gallstones, gallbladder wall thickening or biliary dilatation. Pancreas: Unremarkable. No pancreatic ductal dilatation or surrounding inflammatory  changes. Spleen: Normal in size without focal abnormality. Adrenals/Urinary Tract: Both adrenal glands appear normal. Again demonstrated are findings consistent with chronic severe hydronephrosis on the left with diffuse cortical thinning and no demonstrated contrast excretion. Surrounding soft tissue stranding appears mildly improved from the recent  prior study. The left ureter does not appear dilated, and these findings suggest chronic left UPJ obstruction. The right kidney appears normal, without focal mass lesion, urinary tract calculus, hydronephrosis or delayed contrast excretion. The bladder appears unremarkable for its degree of distention. Stomach/Bowel: No enteric contrast administered. The stomach appears unremarkable for its degree of distension. No evidence of bowel wall thickening, distention or surrounding inflammatory change. The appendix appears normal. Prominent stool throughout the colon. Mild distal colonic diverticular changes without acute inflammation. Vascular/Lymphatic: There are no enlarged abdominal or pelvic lymph nodes. Aortic and branch vessel atherosclerosis without evidence of aneurysm or large vessel occlusion. Reproductive: The uterus and ovaries appear unremarkable. No adnexal mass. Other: No evidence of abdominal wall mass or hernia. No ascites or pneumoperitoneum. Musculoskeletal: No acute or significant osseous findings. Status post L4-5 OLIF with chronic sacroiliac degenerative changes, greater on the left. IMPRESSION: 1. Findings consistent with chronic severe hydronephrosis on the left with diffuse cortical thinning and no demonstrated contrast excretion. Surrounding soft tissue stranding appears mildly improved from the recent prior study. Findings suggest chronic left UPJ obstruction. See recent prior study which references prior outside imaging, and consider urology follow-up. 2. The right kidney appears normal. 3. No other acute findings. 4.  Aortic Atherosclerosis (ICD10-I70.0). Electronically Signed   By: Elmon Hagedorn M.D.   On: 04/09/2024 15:07     Assessment/Plan Principal Problem:   Acute pyelonephritis Active Problems:   Obesity (BMI 30-39.9)   Acquired hypothyroidism   Atrophy of left kidney   Prediabetes   HLD (hyperlipidemia)   Depression   Hypokalemia   HTN (hypertension)    Anxiety   Acute pyelonephritis with hydronephrosis  - Was discharged from ED on keflex  6/11  - Repeat urine culture obtained  - CT A/P showed chronic severe hydronephrosis on left with chronic left UPJ obstruction  - Urology consulted, Dr. Dulcy Gibney  - Empiric IV rocephin   Hypokalemia - Replace  Hypothyroidism - Synthroid   Hypertension - Cozaar   Hyperlipidemia - Crestor  GERD - PPI, pepcid   IBS - Bentyl   Mood d/o - Wellbutrin , Buspar , Prozac , Atarax    Prediabetes - On metformin  PTA  Obesity - On tirzepetide PTA - BMI 38.71      DVT prophylaxis: Lovenox Code Status: Full code Family Communication: Family/friend at bedside Disposition Plan: Home Consults called: Urology    Severity of Illness: The appropriate patient status for this patient is INPATIENT. Inpatient status is judged to be reasonable and necessary in order to provide the required intensity of service to ensure the patient's safety. The patient's presenting symptoms, physical exam findings, and initial radiographic and laboratory data in the context of their chronic comorbidities is felt to place them at high risk for further clinical deterioration. Furthermore, it is not anticipated that the patient will be medically stable for discharge from the hospital within 2 midnights of admission.   * I certify that at the point of admission it is my clinical judgment that the patient will require inpatient hospital care spanning beyond 2 midnights from the point of admission due to high intensity of service, high risk for further deterioration and high frequency of surveillance required.Daren Eck, DO [facility] 04/09/2024, 5:21 PM  Available via Epic secure chat 7am-7pm After these hours, please refer to coverage provider listed on [email or web address]

## 2024-04-09 NOTE — Plan of Care (Signed)

## 2024-04-10 DIAGNOSIS — N10 Acute tubulo-interstitial nephritis: Secondary | ICD-10-CM | POA: Diagnosis not present

## 2024-04-10 LAB — URINE CULTURE: Culture: NO GROWTH

## 2024-04-10 LAB — CBC
HCT: 30 % — ABNORMAL LOW (ref 36.0–46.0)
Hemoglobin: 9.5 g/dL — ABNORMAL LOW (ref 12.0–15.0)
MCH: 28.3 pg (ref 26.0–34.0)
MCHC: 31.7 g/dL (ref 30.0–36.0)
MCV: 89.3 fL (ref 80.0–100.0)
Platelets: 378 10*3/uL (ref 150–400)
RBC: 3.36 MIL/uL — ABNORMAL LOW (ref 3.87–5.11)
RDW: 15.3 % (ref 11.5–15.5)
WBC: 8.8 10*3/uL (ref 4.0–10.5)
nRBC: 0 % (ref 0.0–0.2)

## 2024-04-10 LAB — BASIC METABOLIC PANEL WITH GFR
Anion gap: 10 (ref 5–15)
BUN: 9 mg/dL (ref 6–20)
CO2: 24 mmol/L (ref 22–32)
Calcium: 8.2 mg/dL — ABNORMAL LOW (ref 8.9–10.3)
Chloride: 100 mmol/L (ref 98–111)
Creatinine, Ser: 0.65 mg/dL (ref 0.44–1.00)
GFR, Estimated: >60 mL/min (ref >60)
Glucose, Bld: 122 mg/dL — ABNORMAL HIGH (ref 70–99)
Potassium: 3.6 mmol/L (ref 3.5–5.1)
Sodium: 134 mmol/L — ABNORMAL LOW (ref 135–145)

## 2024-04-10 MED ORDER — OXYCODONE HCL 5 MG PO TABS
5.0000 mg | ORAL_TABLET | ORAL | Status: DC | PRN
  Administered 2024-04-10 – 2024-04-15 (×15): 5 mg via ORAL
  Filled 2024-04-10 (×17): qty 1

## 2024-04-10 MED ORDER — ACETAMINOPHEN-CODEINE 300-30 MG PO TABS
1.0000 | ORAL_TABLET | Freq: Four times a day (QID) | ORAL | Status: DC | PRN
  Administered 2024-04-10 – 2024-04-15 (×6): 1 via ORAL
  Filled 2024-04-10 (×6): qty 1

## 2024-04-10 MED ORDER — ORAL CARE MOUTH RINSE: 15.0000 mL | OROMUCOSAL | Status: DC | PRN

## 2024-04-10 MED ORDER — MELATONIN 5 MG PO TABS
5.0000 mg | ORAL_TABLET | Freq: Every evening | ORAL | Status: AC | PRN
  Administered 2024-04-10 – 2024-04-15 (×3): 5 mg via ORAL
  Filled 2024-04-10 (×3): qty 1

## 2024-04-10 NOTE — Progress Notes (Signed)
   04/10/24 0916  TOC Brief Assessment  Insurance and Status Reviewed  Patient has primary care physician Yes  Home environment has been reviewed Resides in home with friends  Prior level of function: Independent with ADLs at baseline  Prior/Current Home Services No current home services  Social Drivers of Health Review SDOH reviewed no interventions necessary  Readmission risk has been reviewed Yes  Transition of care needs no transition of care needs at this time

## 2024-04-10 NOTE — Progress Notes (Signed)
 Subjective: First time meeting patient, she was resting in bed on my arrival.  We reviewed the case and plan for when she is out of the hospital.  She expressed understanding and would like to transition care to our practice.  All questions were answered to her satisfaction.  Objective: Vital signs in last 24 hours: Temp:  [98.2 F (36.8 C)-99.2 F (37.3 C)] 98.7 F (37.1 C) (06/18 0610) Pulse Rate:  [87-95] 90 (06/18 0610) Resp:  [16-22] 20 (06/18 0610) BP: (115-166)/(63-74) 134/74 (06/18 0610) SpO2:  [95 %-100 %] 97 % (06/18 0610)  Assessment/Plan: 58 year old female with acute onset left-sided flank pain with difficulty urinating.  Klebsiella UTI.  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  Severely atrophic left kidney with no function appreciated on renogram.  # Flank pain Patient feels much better today and flank pain is largely resolved. Will eventually need left nephrectomy.  Discussed with Dr. Dulcy Gibney on an outpatient basis with a goal of around December.  Patient has bilateral knee replacements pending. No signs of systemic infection with preserved renal function. Will follow  [facility] [address] second floor [place], [place]  [address] [phone]   Intake/Output from previous day: 06/17 0701 - 06/18 0700 In: 1672.6 [P.O.:360; IV Piggyback:1312.6] Out: -   Intake/Output this shift: Total I/O In: 240 [P.O.:240] Out: -   Physical Exam:  General: Alert and oriented CV: No cyanosis Lungs: equal chest rise   Lab Results: Recent Labs    04/09/24 1205 04/10/24 0502  HGB 10.5* 9.5*  HCT 33.1* 30.0*   BMET Recent Labs    04/09/24 1205 04/10/24 0502  NA 137 134*  K 2.8* 3.6  CL 98 100  CO2 28 24  GLUCOSE 120* 122*  BUN 8 9  CREATININE 0.65 0.65  CALCIUM 9.0 8.2*  HGB 10.5* 9.5*  WBC 7.5 8.8     Studies/Results: CT ABDOMEN PELVIS W CONTRAST Result Date: 04/09/2024 CLINICAL  DATA:  Left-sided abdominal pain for 1 week. Pyelonephritis suspected. EXAM: CT ABDOMEN AND PELVIS WITH CONTRAST TECHNIQUE: Multidetector CT imaging of the abdomen and pelvis was performed using the standard protocol following bolus administration of intravenous contrast. RADIATION DOSE REDUCTION: This exam was performed according to the departmental dose-optimization program which includes automated exposure control, adjustment of the mA and/or kV according to patient size and/or use of iterative reconstruction technique. CONTRAST:  100mL OMNIPAQUE IOHEXOL 300 MG/ML  SOLN COMPARISON:  CT of the abdomen and pelvis without contrast 04/03/2024. No other comparison studies. FINDINGS: Lower chest: Mild atelectasis at both lung bases. No confluent airspace disease or significant pleural effusion. Hepatobiliary: The liver is normal in density without suspicious focal abnormality. No evidence of gallstones, gallbladder wall thickening or biliary dilatation. Pancreas: Unremarkable. No pancreatic ductal dilatation or surrounding inflammatory changes. Spleen: Normal in size without focal abnormality. Adrenals/Urinary Tract: Both adrenal glands appear normal. Again demonstrated are findings consistent with chronic severe hydronephrosis on the left with diffuse cortical thinning and no demonstrated contrast excretion. Surrounding soft tissue stranding appears mildly improved from the recent prior study. The left ureter does not appear dilated, and these findings suggest chronic left UPJ obstruction. The right kidney appears normal, without focal mass lesion, urinary tract calculus, hydronephrosis or delayed contrast excretion. The bladder appears unremarkable for its degree of distention. Stomach/Bowel: No enteric contrast administered. The stomach appears unremarkable for its degree of distension. No evidence of bowel wall thickening, distention or surrounding inflammatory  change. The appendix appears normal. Prominent stool  throughout the colon. Mild distal colonic diverticular changes without acute inflammation. Vascular/Lymphatic: There are no enlarged abdominal or pelvic lymph nodes. Aortic and branch vessel atherosclerosis without evidence of aneurysm or large vessel occlusion. Reproductive: The uterus and ovaries appear unremarkable. No adnexal mass. Other: No evidence of abdominal wall mass or hernia. No ascites or pneumoperitoneum. Musculoskeletal: No acute or significant osseous findings. Status post L4-5 OLIF with chronic sacroiliac degenerative changes, greater on the left. IMPRESSION: 1. Findings consistent with chronic severe hydronephrosis on the left with diffuse cortical thinning and no demonstrated contrast excretion. Surrounding soft tissue stranding appears mildly improved from the recent prior study. Findings suggest chronic left UPJ obstruction. See recent prior study which references prior outside imaging, and consider urology follow-up. 2. The right kidney appears normal. 3. No other acute findings. 4.  Aortic Atherosclerosis (ICD10-I70.0). Electronically Signed   By: Elmon Hagedorn M.D.   On: 04/09/2024 15:07      LOS: 1 day   Alla Ar, NP [facility] Pager: [phone]  04/10/2024, 11:45 AM

## 2024-04-10 NOTE — Progress Notes (Signed)
 PROGRESS NOTE    Melissa [name]  GUY:[id number] DOB: 02-21-1966 DOA: 04/09/2024 PCP: Emaline Handsome, MD   Brief Narrative:  HPI: Melissa [name] is a 58 y.o. female with medical history significant of HTN, IBS, GERD, hypothyroidism, pre-diabetes, congenital left kidney atrophy who presented to the emergency department twice in the last week for same issues.  She presented 6/11 with left-sided flank pain.  She was diagnosed with pyelonephritis and discharged on Keflex .  She presented again 6/11 to the emergency department with left-sided flank pain.  She was given Flomax  and discharged home.  She went to her primary care physician at [facility] earlier today, was instructed to seek care in the emergency department as she had no improvement on oral antibiotics.   She admits to left-sided flank pain, describes it as severe pain as though she is getting stabbed by a burning branding iron.  Also had some fevers at home.  Nausea and vomiting.  She states that she was diagnosed with congenital kidney disease on the left side when she was 58 years old.    She also admits that she has been under significant stress lately due to sudden passing of her friend's adult child.    ED Course: Labs obtained which reveal hypokalemia 2.8.  UA obtained.  CT abdomen pelvis results as below.  Urology consulted.  Patient given IV Rocephin .  Assessment & Plan:   Principal Problem:   Acute pyelonephritis Active Problems:   Obesity (BMI 30-39.9)   Acquired hypothyroidism   Atrophy of left kidney   Prediabetes   HLD (hyperlipidemia)   Depression   Hypokalemia   HTN (hypertension)   Anxiety  Acute pyelonephritis with hydronephrosis: 2 visits to ED and discharged home on oral antibiotic, continued to have pain despite of taking antibiotics so showed up to the ED again. CT A/P showed chronic severe hydronephrosis on left with chronic left UPJ obstruction.  UA consistent with UTI.  Started on IV Rocephin .  Urology  on board.  Per them, will eventually need left nephrectomy however patient has scheduled bilateral knee replacements in next couple of months so they are planning this electively as outpatient in December.  Patient aware of the plan.  Hypokalemia:  Acquired hypothyroidism: Synthroid .  Essential hypertension: Controlled.  Continue Cozaar .  Hyperlipidemia: Continue Crestor.  GERD: Continue PPI  IBS: Continue Bentyl.  Mood disorder: Continue  Prediabetes: On metformin  PTA.  No need for SSI.  Morbid obesity class II: BMI 38.  Weight loss and diet modification counseled.  DVT prophylaxis: enoxaparin (LOVENOX) injection 40 mg Start: 04/09/24 2200   Code Status: Full Code  Family Communication:  None present at bedside.  Plan of care discussed with patient in length and he/she verbalized understanding and agreed with it.  Status is: Inpatient Remains inpatient appropriate because: Still with pain.   Estimated body mass index is 38.71 kg/m as calculated from the following:   Height as of 04/03/24: 5' 2 (1.575 m).   Weight as of 04/03/24: 96 kg.    Nutritional Assessment: There is no height or weight on file to calculate BMI.. Seen by dietician.  I agree with the assessment and plan as outlined below: Nutrition Status:        . Skin Assessment: I have examined the patient's skin and I agree with the wound assessment as performed by the wound care RN as outlined below:    Consultants:  Urology  Procedures:  None  Antimicrobials:  Anti-infectives (From admission, onward)  Start     Dose/Rate Route Frequency Ordered Stop   04/10/24 1000  cefTRIAXone  (ROCEPHIN ) 2 g in sodium chloride  0.9 % 100 mL IVPB        2 g 200 mL/hr over 30 Minutes Intravenous Every 24 hours 04/09/24 1811     04/09/24 1145  cefTRIAXone  (ROCEPHIN ) 2 g in sodium chloride  0.9 % 100 mL IVPB        2 g 200 mL/hr over 30 Minutes Intravenous  Once 04/09/24 1138 04/09/24 1244          Subjective: Patient examined, she says that her pain is slightly better than yesterday, 5 out of 10 today, was 8 out of 10 yesterday.  No other complaint.  Objective: Vitals:   04/09/24 2147 04/10/24 0251 04/10/24 0610 04/10/24 1154  BP: 126/63 115/63 134/74 (!) 149/75  Pulse: 90 91 90 85  Resp: 18 18 20 18   Temp: 99.2 F (37.3 C) 98.2 F (36.8 C) 98.7 F (37.1 C) 98.3 F (36.8 C)  TempSrc: Oral Oral Oral Oral  SpO2: 96% 95% 97% 98%    Intake/Output Summary (Last 24 hours) at 04/10/2024 1403 Last data filed at 04/10/2024 0800 Gross per 24 hour  Intake 1812.58 ml  Output --  Net 1812.58 ml   There were no vitals filed for this visit.  Examination:  General exam: Appears calm and comfortable  Respiratory system: Clear to auscultation. Respiratory effort normal. Cardiovascular system: S1 & S2 heard, RRR. No JVD, murmurs, rubs, gallops or clicks. No pedal edema. Gastrointestinal system: Abdomen is nondistended, soft and nontender. No organomegaly or masses felt. Normal bowel sounds heard.  Left CVA tenderness Central nervous system: Alert and oriented. No focal neurological deficits. Extremities: Symmetric 5 x 5 power. Skin: No rashes, lesions or ulcers Psychiatry: Judgement and insight appear normal. Mood & affect appropriate.    Data Reviewed: I have personally reviewed following labs and imaging studies  CBC: Recent Labs  Lab 04/03/24 1450 04/09/24 1205 04/10/24 0502  WBC 13.8* 7.5 8.8  NEUTROABS  --  4.9  --   HGB 11.4* 10.5* 9.5*  HCT 35.2* 33.1* 30.0*  MCV 86.3 87.1 89.3  PLT 339 436* 378   Basic Metabolic Panel: Recent Labs  Lab 04/03/24 1450 04/09/24 1205 04/09/24 1326 04/10/24 0502  NA 139 137  --  134*  K 4.0 2.8*  --  3.6  CL 106 98  --  100  CO2 20* 28  --  24  GLUCOSE 97 120*  --  122*  BUN 18 8  --  9  CREATININE 0.92 0.65  --  0.65  CALCIUM 10.2 9.0  --  8.2*  MG  --   --  1.8  --    GFR: Estimated Creatinine Clearance: 80.9  mL/min (by C-G formula based on SCr of 0.65 mg/dL). Liver Function Tests: Recent Labs  Lab 04/03/24 1450 04/09/24 1205  AST 14* 30  ALT 8 23  ALKPHOS 114 158*  BILITOT 0.8 0.7  PROT 7.5 7.2  ALBUMIN 4.2 3.0*   Recent Labs  Lab 04/03/24 1450 04/09/24 1205  LIPASE 17 19   No results for input(s): AMMONIA in the last 168 hours. Coagulation Profile: No results for input(s): INR, PROTIME in the last 168 hours. Cardiac Enzymes: No results for input(s): CKTOTAL, CKMB, CKMBINDEX, TROPONINI in the last 168 hours. BNP (last 3 results) No results for input(s): PROBNP in the last 8760 hours. HbA1C: No results for input(s): HGBA1C in the last 72  hours. CBG: No results for input(s): GLUCAP in the last 168 hours. Lipid Profile: No results for input(s): CHOL, HDL, LDLCALC, TRIG, CHOLHDL, LDLDIRECT in the last 72 hours. Thyroid  Function Tests: No results for input(s): TSH, T4TOTAL, FREET4, T3FREE, THYROIDAB in the last 72 hours. Anemia Panel: Recent Labs    04/09/24 1300  VITAMINB12 1,003*  FOLATE 13.2  FERRITIN 216  TIBC 272  IRON 14*   Sepsis Labs: Recent Labs  Lab 04/09/24 1155 04/09/24 1419  LATICACIDVEN 1.6 1.4    Recent Results (from the past 240 hours)  Urine Culture     Status: Abnormal   Collection Time: 04/03/24  2:50 PM   Specimen: Urine, Clean Catch  Result Value Ref Range Status   Specimen Description   Final    URINE, CLEAN CATCH Performed at [facility], [address], [place], [place] [address]    Special Requests   Final    NONE Performed at [facility], [address] [place], [place] [address]    Culture >=100,000 COLONIES/mL KLEBSIELLA PNEUMONIAE (A)  Final   Report Status 04/05/2024 FINAL  Final   Organism ID, Bacteria KLEBSIELLA PNEUMONIAE (A)  Final      Susceptibility   Klebsiella pneumoniae - MIC*    AMPICILLIN >=32 RESISTANT Resistant     CEFAZOLIN  <=4  SENSITIVE Sensitive     CEFEPIME <=0.12 SENSITIVE Sensitive     CEFTRIAXONE  <=0.25 SENSITIVE Sensitive     CIPROFLOXACIN 0.5 INTERMEDIATE Intermediate     GENTAMICIN <=1 SENSITIVE Sensitive     IMIPENEM <=0.25 SENSITIVE Sensitive     NITROFURANTOIN 64 INTERMEDIATE Intermediate     TRIMETH/SULFA >=320 RESISTANT Resistant     AMPICILLIN/SULBACTAM 16 INTERMEDIATE Intermediate     PIP/TAZO <=4 SENSITIVE Sensitive ug/mL    * >=100,000 COLONIES/mL KLEBSIELLA PNEUMONIAE  Culture, blood (routine x 2)     Status: None (Preliminary result)   Collection Time: 04/09/24 11:55 AM   Specimen: BLOOD  Result Value Ref Range Status   Specimen Description   Final    BLOOD LEFT ANTECUBITAL Performed at [facility], [address] [place], [place] [address]    Special Requests   Final    BOTTLES DRAWN AEROBIC AND ANAEROBIC Blood Culture adequate volume Performed at [facility], [address], [place], [place] [address]    Culture   Final    NO GROWTH < 24 HOURS Performed at [facility], [address] [place], [place] [address]    Report Status PENDING  Incomplete  Culture, blood (routine x 2)     Status: None (Preliminary result)   Collection Time: 04/09/24 11:56 AM   Specimen: BLOOD  Result Value Ref Range Status   Specimen Description   Final    BLOOD RIGHT ANTECUBITAL Performed at [facility], [address] [place], [place] [address]    Special Requests   Final    BOTTLES DRAWN AEROBIC AND ANAEROBIC Blood Culture adequate volume Performed at [facility], [address] [place], [place] [address]    Culture   Final    NO GROWTH < 24 HOURS Performed at [facility], [address], [place], [place] [address]    Report Status PENDING  Incomplete  Urine Culture     Status: None   Collection Time: 04/09/24  2:26 PM   Specimen: Urine, Clean Catch  Result Value Ref Range Status   Specimen  Description  Final    URINE, CLEAN CATCH Performed at [facility], [address] [place], [place] [address]    Special Requests   Final    NONE Reflexed from [phone] [phone] Performed at [facility], [address] [place], [place] [address]    Culture   Final    NO GROWTH Performed at [facility], [address], [place], [place] [address]    Report Status 04/10/2024 FINAL  Final     Radiology Studies: CT ABDOMEN PELVIS W CONTRAST Result Date: 04/09/2024 CLINICAL DATA:  Left-sided abdominal pain for 1 week. Pyelonephritis suspected. EXAM: CT ABDOMEN AND PELVIS WITH CONTRAST TECHNIQUE: Multidetector CT imaging of the abdomen and pelvis was performed using the standard protocol following bolus administration of intravenous contrast. RADIATION DOSE REDUCTION: This exam was performed according to the departmental dose-optimization program which includes automated exposure control, adjustment of the mA and/or kV according to patient size and/or use of iterative reconstruction technique. CONTRAST:  100mL OMNIPAQUE IOHEXOL 300 MG/ML  SOLN COMPARISON:  CT of the abdomen and pelvis without contrast 04/03/2024. No other comparison studies. FINDINGS: Lower chest: Mild atelectasis at both lung bases. No confluent airspace disease or significant pleural effusion. Hepatobiliary: The liver is normal in density without suspicious focal abnormality. No evidence of gallstones, gallbladder wall thickening or biliary dilatation. Pancreas: Unremarkable. No pancreatic ductal dilatation or surrounding inflammatory changes. Spleen: Normal in size without focal abnormality. Adrenals/Urinary Tract: Both adrenal glands appear normal. Again demonstrated are findings consistent with chronic severe hydronephrosis on the left with diffuse cortical thinning and no demonstrated contrast excretion. Surrounding soft tissue stranding appears mildly improved from the recent prior study. The left ureter  does not appear dilated, and these findings suggest chronic left UPJ obstruction. The right kidney appears normal, without focal mass lesion, urinary tract calculus, hydronephrosis or delayed contrast excretion. The bladder appears unremarkable for its degree of distention. Stomach/Bowel: No enteric contrast administered. The stomach appears unremarkable for its degree of distension. No evidence of bowel wall thickening, distention or surrounding inflammatory change. The appendix appears normal. Prominent stool throughout the colon. Mild distal colonic diverticular changes without acute inflammation. Vascular/Lymphatic: There are no enlarged abdominal or pelvic lymph nodes. Aortic and branch vessel atherosclerosis without evidence of aneurysm or large vessel occlusion. Reproductive: The uterus and ovaries appear unremarkable. No adnexal mass. Other: No evidence of abdominal wall mass or hernia. No ascites or pneumoperitoneum. Musculoskeletal: No acute or significant osseous findings. Status post L4-5 OLIF with chronic sacroiliac degenerative changes, greater on the left. IMPRESSION: 1. Findings consistent with chronic severe hydronephrosis on the left with diffuse cortical thinning and no demonstrated contrast excretion. Surrounding soft tissue stranding appears mildly improved from the recent prior study. Findings suggest chronic left UPJ obstruction. See recent prior study which references prior outside imaging, and consider urology follow-up. 2. The right kidney appears normal. 3. No other acute findings. 4.  Aortic Atherosclerosis (ICD10-I70.0). Electronically Signed   By: Elmon Hagedorn M.D.   On: 04/09/2024 15:07    Scheduled Meds:  buPROPion   300 mg Oral q morning   busPIRone   5 mg Oral BID   dicyclomine  10 mg Oral TID AC & HS   enoxaparin (LOVENOX) injection  40 mg Subcutaneous Q24H   famotidine   20 mg Oral BID   FLUoxetine   40 mg Oral Daily   hydrOXYzine   25 mg Oral Q6H   levothyroxine   125 mcg  Oral QAC breakfast   losartan   25  mg Oral Daily   pantoprazole   80 mg Oral Daily   rosuvastatin  10 mg Oral Daily   Continuous Infusions:  cefTRIAXone  (ROCEPHIN )  IV 2 g (04/10/24 0854)     LOS: 1 day   Modena Andes, MD [facility]  04/10/2024, 2:03 PM   *Please note that this is a verbal dictation therefore any spelling or grammatical errors are due to the Dragon Medical One system interpretation.  Please page via Amion and do not message via secure chat for urgent patient care matters. Secure chat can be used for non urgent patient care matters.  How to contact the [facility] Attending or Consulting provider 7A - 7P or covering provider during after hours 7P -7A, for this patient?  Check the care team in [facility] and look for a) attending/consulting [facility] provider listed and b) the [facility] team listed. Page or secure chat 7A-7P. Log into [email or web address] and use [facility]'s universal password to access. If you do not have the password, please contact the hospital operator. Locate the [facility] provider you are looking for under [facility] and page to a number that you can be directly reached. If you still have difficulty reaching the provider, please page the [facility] (Director on Call) for the Hospitalists listed on amion for assistance.

## 2024-04-10 NOTE — Progress Notes (Signed)
 Mobility Specialist - Progress Note   04/10/24 1209  Mobility  Activity Ambulated with assistance in hallway  Level of Assistance Modified independent, requires aide device or extra time  Assistive Device Front wheel walker  Distance Ambulated (ft) 500 ft  Range of Motion/Exercises Active  Activity Response Tolerated well  Mobility Referral Yes  Mobility visit 1 Mobility  Mobility Specialist Start Time (ACUTE ONLY) 1158  Mobility Specialist Stop Time (ACUTE ONLY) 1209  Mobility Specialist Time Calculation (min) (ACUTE ONLY) 11 min   Pt was found in bed and agreeable to ambulate. No complaints with session. At EOS returned to bed with all needs met. Call bell in reach.  Lorna Rose,  Mobility Specialist Can be reached via Secure Chat

## 2024-04-11 DIAGNOSIS — N10 Acute tubulo-interstitial nephritis: Secondary | ICD-10-CM | POA: Diagnosis not present

## 2024-04-11 LAB — BASIC METABOLIC PANEL WITH GFR
Anion gap: 8 (ref 5–15)
BUN: 7 mg/dL (ref 6–20)
CO2: 27 mmol/L (ref 22–32)
Calcium: 8.4 mg/dL — ABNORMAL LOW (ref 8.9–10.3)
Chloride: 101 mmol/L (ref 98–111)
Creatinine, Ser: 0.66 mg/dL (ref 0.44–1.00)
GFR, Estimated: >60 mL/min (ref >60)
Glucose, Bld: 141 mg/dL — ABNORMAL HIGH (ref 70–99)
Potassium: 3.2 mmol/L — ABNORMAL LOW (ref 3.5–5.1)
Sodium: 136 mmol/L (ref 135–145)

## 2024-04-11 LAB — CBC WITH DIFFERENTIAL/PLATELET
Abs Immature Granulocytes: 0.41 10*3/uL — ABNORMAL HIGH (ref 0.00–0.07)
Basophils Absolute: 0 10*3/uL (ref 0.0–0.1)
Basophils Relative: 0 %
Eosinophils Absolute: 0.1 10*3/uL (ref 0.0–0.5)
Eosinophils Relative: 1 %
HCT: 29.1 % — ABNORMAL LOW (ref 36.0–46.0)
Hemoglobin: 8.8 g/dL — ABNORMAL LOW (ref 12.0–15.0)
Immature Granulocytes: 4 %
Lymphocytes Relative: 14 %
Lymphs Abs: 1.6 10*3/uL (ref 0.7–4.0)
MCH: 27 pg (ref 26.0–34.0)
MCHC: 30.2 g/dL (ref 30.0–36.0)
MCV: 89.3 fL (ref 80.0–100.0)
Monocytes Absolute: 1.1 10*3/uL — ABNORMAL HIGH (ref 0.1–1.0)
Monocytes Relative: 10 %
Neutro Abs: 8 10*3/uL — ABNORMAL HIGH (ref 1.7–7.7)
Neutrophils Relative %: 71 %
Platelets: 476 10*3/uL — ABNORMAL HIGH (ref 150–400)
RBC: 3.26 MIL/uL — ABNORMAL LOW (ref 3.87–5.11)
RDW: 15.3 % (ref 11.5–15.5)
WBC: 11.2 10*3/uL — ABNORMAL HIGH (ref 4.0–10.5)
nRBC: 0 % (ref 0.0–0.2)

## 2024-04-11 MED ORDER — POTASSIUM CHLORIDE CRYS ER 20 MEQ PO TBCR
40.0000 meq | EXTENDED_RELEASE_TABLET | ORAL | Status: AC
  Administered 2024-04-11 (×2): 40 meq via ORAL
  Filled 2024-04-11 (×2): qty 2

## 2024-04-11 NOTE — Progress Notes (Signed)
 PROGRESS NOTE    Melissa [name]  ZOX:[id number] DOB: 1966/02/20 DOA: 04/09/2024 PCP: Emaline Handsome, MD   Brief Narrative:  HPI: Melissa [name] is a 58 y.o. female with medical history significant of HTN, IBS, GERD, hypothyroidism, pre-diabetes, congenital left kidney atrophy who presented to the emergency department twice in the last week for same issues.  She presented 6/11 with left-sided flank pain.  She was diagnosed with pyelonephritis and discharged on Keflex .  She presented again 6/11 to the emergency department with left-sided flank pain.  She was given Flomax  and discharged home.  She went to her primary care physician at [facility] earlier today, was instructed to seek care in the emergency department as she had no improvement on oral antibiotics.   She admits to left-sided flank pain, describes it as severe pain as though she is getting stabbed by a burning branding iron.  Also had some fevers at home.  Nausea and vomiting.  She states that she was diagnosed with congenital kidney disease on the left side when she was 58 years old.    She also admits that she has been under significant stress lately due to sudden passing of her friend's adult child.    ED Course: Labs obtained which reveal hypokalemia 2.8.  UA obtained.  CT abdomen pelvis results as below.  Urology consulted.  Patient given IV Rocephin .  Assessment & Plan:   Principal Problem:   Acute pyelonephritis Active Problems:   Obesity (BMI 30-39.9)   Acquired hypothyroidism   Atrophy of left kidney   Prediabetes   HLD (hyperlipidemia)   Depression   Hypokalemia   HTN (hypertension)   Anxiety  Acute pyelonephritis with hydronephrosis: 2 visits to ED and discharged home on oral antibiotic, continued to have pain despite of taking antibiotics so showed up to the ED again. CT A/P showed chronic severe hydronephrosis on left with chronic left UPJ obstruction.  UA consistent with UTI.  Started on IV Rocephin .  Urology  on board.  Per them, will eventually need left nephrectomy however patient has scheduled bilateral knee replacements in next couple of months so they are planning this electively as outpatient in December.  Patient aware of the plan.  He spiked a fever of 100.7 at 8 PM last night.  CBC also shows leukocytosis now.  Urine culture remains negative.  Continue Rocephin .  Will need to observe overnight and plan to discharge once she is afebrile for 24 hours.   Hypokalemia: Low again, will replenish.  Acquired hypothyroidism: Continue Synthroid .  Essential hypertension: Controlled.  Continue Cozaar .  Hyperlipidemia: Continue Crestor.  GERD: Continue PPI  IBS: Continue Bentyl.  Mood disorder: Continue  Prediabetes: On metformin  PTA.  No need for SSI.  Morbid obesity class II: BMI 38.  Weight loss and diet modification counseled.  DVT prophylaxis: enoxaparin (LOVENOX) injection 40 mg Start: 04/09/24 2200   Code Status: Full Code  Family Communication:  None present at bedside.  Plan of care discussed with patient in length and he/she verbalized understanding and agreed with it.  Status is: Inpatient Remains inpatient appropriate because: Hypokalemic with fever.   Estimated body mass index is 38.71 kg/m as calculated from the following:   Height as of 04/03/24: 5' 2 (1.575 m).   Weight as of 04/03/24: 96 kg.    Nutritional Assessment: There is no height or weight on file to calculate BMI.. Seen by dietician.  I agree with the assessment and plan as outlined below: Nutrition Status:        .  Skin Assessment: I have examined the patient's skin and I agree with the wound assessment as performed by the wound care RN as outlined below:    Consultants:  Urology  Procedures:  None  Antimicrobials:  Anti-infectives (From admission, onward)    Start     Dose/Rate Route Frequency Ordered Stop   04/10/24 1000  cefTRIAXone  (ROCEPHIN ) 2 g in sodium chloride  0.9 % 100 mL IVPB         2 g 200 mL/hr over 30 Minutes Intravenous Every 24 hours 04/09/24 1811     04/09/24 1145  cefTRIAXone  (ROCEPHIN ) 2 g in sodium chloride  0.9 % 100 mL IVPB        2 g 200 mL/hr over 30 Minutes Intravenous  Once 04/09/24 1138 04/09/24 1244         Subjective: Seen examined, she says that she is not feeling well because she spiked fever last night.  Her pain is controlled with the pain medications as long as she is taking them, otherwise it is 6 out of 10 at times.  No other complaint.  Objective: Vitals:   04/10/24 0610 04/10/24 1154 04/10/24 2009 04/11/24 0508  BP: 134/74 (!) 149/75 (!) 144/60 (!) 119/57  Pulse: 90 85 91 81  Resp: 20 18 20 18   Temp: 98.7 F (37.1 C) 98.3 F (36.8 C) (!) 100.7 F (38.2 C) 99.6 F (37.6 C)  TempSrc: Oral Oral Oral Oral  SpO2: 97% 98% 97% 95%    Intake/Output Summary (Last 24 hours) at 04/11/2024 0844 Last data filed at 04/10/2024 1800 Gross per 24 hour  Intake 100 ml  Output --  Net 100 ml   There were no vitals filed for this visit.  Examination:  General exam: Appears calm and comfortable  Respiratory system: Clear to auscultation. Respiratory effort normal. Cardiovascular system: S1 & S2 heard, RRR. No JVD, murmurs, rubs, gallops or clicks. No pedal edema. Gastrointestinal system: Abdomen is nondistended, soft and left CVA tenderness.  No organomegaly or masses felt. Normal bowel sounds heard. Central nervous system: Alert and oriented. No focal neurological deficits. Extremities: Symmetric 5 x 5 power. Skin: No rashes, lesions or ulcers.  Psychiatry: Judgement and insight appear normal. Mood & affect appropriate.   Data Reviewed: I have personally reviewed following labs and imaging studies  CBC: Recent Labs  Lab 04/09/24 1205 04/10/24 0502  WBC 7.5 8.8  NEUTROABS 4.9  --   HGB 10.5* 9.5*  HCT 33.1* 30.0*  MCV 87.1 89.3  PLT 436* 378   Basic Metabolic Panel: Recent Labs  Lab 04/09/24 1205 04/09/24 1326 04/10/24 0502   NA 137  --  134*  K 2.8*  --  3.6  CL 98  --  100  CO2 28  --  24  GLUCOSE 120*  --  122*  BUN 8  --  9  CREATININE 0.65  --  0.65  CALCIUM 9.0  --  8.2*  MG  --  1.8  --    GFR: Estimated Creatinine Clearance: 80.9 mL/min (by C-G formula based on SCr of 0.65 mg/dL). Liver Function Tests: Recent Labs  Lab 04/09/24 1205  AST 30  ALT 23  ALKPHOS 158*  BILITOT 0.7  PROT 7.2  ALBUMIN 3.0*   Recent Labs  Lab 04/09/24 1205  LIPASE 19   No results for input(s): AMMONIA in the last 168 hours. Coagulation Profile: No results for input(s): INR, PROTIME in the last 168 hours. Cardiac Enzymes: No results for input(s): CKTOTAL, CKMB,  CKMBINDEX, TROPONINI in the last 168 hours. BNP (last 3 results) No results for input(s): PROBNP in the last 8760 hours. HbA1C: No results for input(s): HGBA1C in the last 72 hours. CBG: No results for input(s): GLUCAP in the last 168 hours. Lipid Profile: No results for input(s): CHOL, HDL, LDLCALC, TRIG, CHOLHDL, LDLDIRECT in the last 72 hours. Thyroid  Function Tests: No results for input(s): TSH, T4TOTAL, FREET4, T3FREE, THYROIDAB in the last 72 hours. Anemia Panel: Recent Labs    04/09/24 1300  VITAMINB12 1,003*  FOLATE 13.2  FERRITIN 216  TIBC 272  IRON 14*   Sepsis Labs: Recent Labs  Lab 04/09/24 1155 04/09/24 1419  LATICACIDVEN 1.6 1.4    Recent Results (from the past 240 hours)  Urine Culture     Status: Abnormal   Collection Time: 04/03/24  2:50 PM   Specimen: Urine, Clean Catch  Result Value Ref Range Status   Specimen Description   Final    URINE, CLEAN CATCH Performed at [facility], [address] [place], [place] [address]    Special Requests   Final    NONE Performed at [facility], [address], [place], [place] [address]    Culture >=100,000 COLONIES/mL KLEBSIELLA PNEUMONIAE (A)  Final   Report Status 04/05/2024 FINAL   Final   Organism ID, Bacteria KLEBSIELLA PNEUMONIAE (A)  Final      Susceptibility   Klebsiella pneumoniae - MIC*    AMPICILLIN >=32 RESISTANT Resistant     CEFAZOLIN  <=4 SENSITIVE Sensitive     CEFEPIME <=0.12 SENSITIVE Sensitive     CEFTRIAXONE  <=0.25 SENSITIVE Sensitive     CIPROFLOXACIN 0.5 INTERMEDIATE Intermediate     GENTAMICIN <=1 SENSITIVE Sensitive     IMIPENEM <=0.25 SENSITIVE Sensitive     NITROFURANTOIN 64 INTERMEDIATE Intermediate     TRIMETH/SULFA >=320 RESISTANT Resistant     AMPICILLIN/SULBACTAM 16 INTERMEDIATE Intermediate     PIP/TAZO <=4 SENSITIVE Sensitive ug/mL    * >=100,000 COLONIES/mL KLEBSIELLA PNEUMONIAE  Culture, blood (routine x 2)     Status: None (Preliminary result)   Collection Time: 04/09/24 11:55 AM   Specimen: BLOOD  Result Value Ref Range Status   Specimen Description   Final    BLOOD LEFT ANTECUBITAL Performed at [facility], [address], [place], [place] [address]    Special Requests   Final    BOTTLES DRAWN AEROBIC AND ANAEROBIC Blood Culture adequate volume Performed at [facility], [address], [place], [place] [address]    Culture   Final    NO GROWTH 2 DAYS Performed at [facility], [address] [place], [place] [address]    Report Status PENDING  Incomplete  Culture, blood (routine x 2)     Status: None (Preliminary result)   Collection Time: 04/09/24 11:56 AM   Specimen: BLOOD  Result Value Ref Range Status   Specimen Description   Final    BLOOD RIGHT ANTECUBITAL Performed at [facility], [address] [place], [place] [address]    Special Requests   Final    BOTTLES DRAWN AEROBIC AND ANAEROBIC Blood Culture adequate volume Performed at [facility], [address] [place], [place] [address]    Culture   Final    NO GROWTH 2 DAYS Performed at [facility], [address] [place], [place] [address]    Report Status  PENDING  Incomplete  Urine Culture  Status: None   Collection Time: 04/09/24  2:26 PM   Specimen: Urine, Clean Catch  Result Value Ref Range Status   Specimen Description   Final    URINE, CLEAN CATCH Performed at [facility], [address] [place], [place] [address]    Special Requests   Final    NONE Reflexed from [id number] Performed at [facility], [address], [place], [place] [address]    Culture   Final    NO GROWTH Performed at [facility], [address], [place], [place] [address]    Report Status 04/10/2024 FINAL  Final     Radiology Studies: CT ABDOMEN PELVIS W CONTRAST Result Date: 04/09/2024 CLINICAL DATA:  Left-sided abdominal pain for 1 week. Pyelonephritis suspected. EXAM: CT ABDOMEN AND PELVIS WITH CONTRAST TECHNIQUE: Multidetector CT imaging of the abdomen and pelvis was performed using the standard protocol following bolus administration of intravenous contrast. RADIATION DOSE REDUCTION: This exam was performed according to the departmental dose-optimization program which includes automated exposure control, adjustment of the mA and/or kV according to patient size and/or use of iterative reconstruction technique. CONTRAST:  100mL OMNIPAQUE IOHEXOL 300 MG/ML  SOLN COMPARISON:  CT of the abdomen and pelvis without contrast 04/03/2024. No other comparison studies. FINDINGS: Lower chest: Mild atelectasis at both lung bases. No confluent airspace disease or significant pleural effusion. Hepatobiliary: The liver is normal in density without suspicious focal abnormality. No evidence of gallstones, gallbladder wall thickening or biliary dilatation. Pancreas: Unremarkable. No pancreatic ductal dilatation or surrounding inflammatory changes. Spleen: Normal in size without focal abnormality. Adrenals/Urinary Tract: Both adrenal glands appear normal. Again demonstrated are findings consistent with chronic severe hydronephrosis on the left with  diffuse cortical thinning and no demonstrated contrast excretion. Surrounding soft tissue stranding appears mildly improved from the recent prior study. The left ureter does not appear dilated, and these findings suggest chronic left UPJ obstruction. The right kidney appears normal, without focal mass lesion, urinary tract calculus, hydronephrosis or delayed contrast excretion. The bladder appears unremarkable for its degree of distention. Stomach/Bowel: No enteric contrast administered. The stomach appears unremarkable for its degree of distension. No evidence of bowel wall thickening, distention or surrounding inflammatory change. The appendix appears normal. Prominent stool throughout the colon. Mild distal colonic diverticular changes without acute inflammation. Vascular/Lymphatic: There are no enlarged abdominal or pelvic lymph nodes. Aortic and branch vessel atherosclerosis without evidence of aneurysm or large vessel occlusion. Reproductive: The uterus and ovaries appear unremarkable. No adnexal mass. Other: No evidence of abdominal wall mass or hernia. No ascites or pneumoperitoneum. Musculoskeletal: No acute or significant osseous findings. Status post L4-5 OLIF with chronic sacroiliac degenerative changes, greater on the left. IMPRESSION: 1. Findings consistent with chronic severe hydronephrosis on the left with diffuse cortical thinning and no demonstrated contrast excretion. Surrounding soft tissue stranding appears mildly improved from the recent prior study. Findings suggest chronic left UPJ obstruction. See recent prior study which references prior outside imaging, and consider urology follow-up. 2. The right kidney appears normal. 3. No other acute findings. 4.  Aortic Atherosclerosis (ICD10-I70.0). Electronically Signed   By: Elmon Hagedorn M.D.   On: 04/09/2024 15:07    Scheduled Meds:  buPROPion   300 mg Oral q morning   busPIRone   5 mg Oral BID   dicyclomine  10 mg Oral TID AC & HS    enoxaparin (LOVENOX) injection  40 mg Subcutaneous Q24H   famotidine   20 mg Oral BID  FLUoxetine   40 mg Oral Daily   hydrOXYzine   25 mg Oral Q6H   levothyroxine   125 mcg Oral QAC breakfast   losartan   25 mg Oral Daily   pantoprazole   80 mg Oral Daily   rosuvastatin  10 mg Oral Daily   Continuous Infusions:  cefTRIAXone  (ROCEPHIN )  IV 2 g (04/10/24 0854)     LOS: 2 days   Modena Andes, MD [facility]  04/11/2024, 8:44 AM   *Please note that this is a verbal dictation therefore any spelling or grammatical errors are due to the Dragon Medical One system interpretation.  Please page via Amion and do not message via secure chat for urgent patient care matters. Secure chat can be used for non urgent patient care matters.  How to contact the [facility] Attending or Consulting provider 7A - 7P or covering provider during after hours 7P -7A, for this patient?  Check the care team in [facility] and look for a) attending/consulting [facility] provider listed and b) the [facility] team listed. Page or secure chat 7A-7P. Log into [email or web address] and use [place]'s universal password to access. If you do not have the password, please contact the hospital operator. Locate the [facility] provider you are looking for under [facility] and page to a number that you can be directly reached. If you still have difficulty reaching the provider, please page the [facility] (Director on Call) for the Hospitalists listed on amion for assistance.

## 2024-04-11 NOTE — Progress Notes (Signed)
 Subjective: Patient reports increased flank pain and fever overnight.  Reviewed case and plan in the presence of one of her friends.  Objective: Vital signs in last 24 hours: Temp:  [98.3 F (36.8 C)-100.7 F (38.2 C)] 99.6 F (37.6 C) (06/19 0508) Pulse Rate:  [81-91] 81 (06/19 0508) Resp:  [18-20] 18 (06/19 0508) BP: (119-149)/(57-75) 119/57 (06/19 0508) SpO2:  [95 %-98 %] 95 % (06/19 0508)  Assessment/Plan: # Flank pain # Atrophied left kidney with vesicoureteral reflux  Will eventually need left nephrectomy.  Discussed with Dr. Dulcy Gibney on an outpatient basis with a goal of around December.  Patient has bilateral knee replacements pending.  No signs of systemic infection with preserved renal function.  In light of elevated temperature last night-Tmax 100.7 and increased left flank pain, will have nursing place Foley catheter this morning to maximize drainage.  Failing improvement from that, will discuss percutaneous nephrostomy tube.  Will follow  Intake/Output from previous day: 06/18 0701 - 06/19 0700 In: 340 [P.O.:240; IV Piggyback:100] Out: -   Intake/Output this shift: No intake/output data recorded.  Physical Exam:  General: Alert and oriented CV: No cyanosis Lungs: equal chest rise    Lab Results: Recent Labs    04/09/24 1205 04/10/24 0502 04/11/24 0817  HGB 10.5* 9.5* 8.8*  HCT 33.1* 30.0* 29.1*   BMET Recent Labs    04/10/24 0502 04/11/24 0817  NA 134* 136  K 3.6 3.2*  CL 100 101  CO2 24 27  GLUCOSE 122* 141*  BUN 9 7  CREATININE 0.65 0.66  CALCIUM 8.2* 8.4*  HGB 9.5* 8.8*  WBC 8.8 11.2*     Studies/Results: CT ABDOMEN PELVIS W CONTRAST Result Date: 04/09/2024 CLINICAL DATA:  Left-sided abdominal pain for 1 week. Pyelonephritis suspected. EXAM: CT ABDOMEN AND PELVIS WITH CONTRAST TECHNIQUE: Multidetector CT imaging of the abdomen and pelvis was performed using the standard protocol following bolus administration of intravenous  contrast. RADIATION DOSE REDUCTION: This exam was performed according to the departmental dose-optimization program which includes automated exposure control, adjustment of the mA and/or kV according to patient size and/or use of iterative reconstruction technique. CONTRAST:  100mL OMNIPAQUE IOHEXOL 300 MG/ML  SOLN COMPARISON:  CT of the abdomen and pelvis without contrast 04/03/2024. No other comparison studies. FINDINGS: Lower chest: Mild atelectasis at both lung bases. No confluent airspace disease or significant pleural effusion. Hepatobiliary: The liver is normal in density without suspicious focal abnormality. No evidence of gallstones, gallbladder wall thickening or biliary dilatation. Pancreas: Unremarkable. No pancreatic ductal dilatation or surrounding inflammatory changes. Spleen: Normal in size without focal abnormality. Adrenals/Urinary Tract: Both adrenal glands appear normal. Again demonstrated are findings consistent with chronic severe hydronephrosis on the left with diffuse cortical thinning and no demonstrated contrast excretion. Surrounding soft tissue stranding appears mildly improved from the recent prior study. The left ureter does not appear dilated, and these findings suggest chronic left UPJ obstruction. The right kidney appears normal, without focal mass lesion, urinary tract calculus, hydronephrosis or delayed contrast excretion. The bladder appears unremarkable for its degree of distention. Stomach/Bowel: No enteric contrast administered. The stomach appears unremarkable for its degree of distension. No evidence of bowel wall thickening, distention or surrounding inflammatory change. The appendix appears normal. Prominent stool throughout the colon. Mild distal colonic diverticular changes without acute inflammation. Vascular/Lymphatic: There are no enlarged abdominal or pelvic lymph nodes. Aortic and branch vessel atherosclerosis without evidence of aneurysm or large vessel occlusion.  Reproductive: The uterus and ovaries  appear unremarkable. No adnexal mass. Other: No evidence of abdominal wall mass or hernia. No ascites or pneumoperitoneum. Musculoskeletal: No acute or significant osseous findings. Status post L4-5 OLIF with chronic sacroiliac degenerative changes, greater on the left. IMPRESSION: 1. Findings consistent with chronic severe hydronephrosis on the left with diffuse cortical thinning and no demonstrated contrast excretion. Surrounding soft tissue stranding appears mildly improved from the recent prior study. Findings suggest chronic left UPJ obstruction. See recent prior study which references prior outside imaging, and consider urology follow-up. 2. The right kidney appears normal. 3. No other acute findings. 4.  Aortic Atherosclerosis (ICD10-I70.0). Electronically Signed   By: Elmon Hagedorn M.D.   On: 04/09/2024 15:07      LOS: 2 days   Alla Ar, NP [facility] Pager: [phone]  04/11/2024, 11:45 AM

## 2024-04-11 NOTE — Plan of Care (Signed)

## 2024-04-12 DIAGNOSIS — N10 Acute tubulo-interstitial nephritis: Secondary | ICD-10-CM | POA: Diagnosis not present

## 2024-04-12 LAB — BASIC METABOLIC PANEL WITH GFR
Anion gap: 11 (ref 5–15)
BUN: 8 mg/dL (ref 6–20)
CO2: 26 mmol/L (ref 22–32)
Calcium: 8.3 mg/dL — ABNORMAL LOW (ref 8.9–10.3)
Chloride: 100 mmol/L (ref 98–111)
Creatinine, Ser: 0.66 mg/dL (ref 0.44–1.00)
GFR, Estimated: >60 mL/min (ref >60)
Glucose, Bld: 125 mg/dL — ABNORMAL HIGH (ref 70–99)
Potassium: 3.9 mmol/L (ref 3.5–5.1)
Sodium: 137 mmol/L (ref 135–145)

## 2024-04-12 LAB — CBC WITH DIFFERENTIAL/PLATELET
Abs Immature Granulocytes: 0.28 10*3/uL — ABNORMAL HIGH (ref 0.00–0.07)
Basophils Absolute: 0 10*3/uL (ref 0.0–0.1)
Basophils Relative: 0 %
Eosinophils Absolute: 0.1 10*3/uL (ref 0.0–0.5)
Eosinophils Relative: 2 %
HCT: 27.9 % — ABNORMAL LOW (ref 36.0–46.0)
Hemoglobin: 8.6 g/dL — ABNORMAL LOW (ref 12.0–15.0)
Immature Granulocytes: 3 %
Lymphocytes Relative: 14 %
Lymphs Abs: 1.3 10*3/uL (ref 0.7–4.0)
MCH: 27.2 pg (ref 26.0–34.0)
MCHC: 30.8 g/dL (ref 30.0–36.0)
MCV: 88.3 fL (ref 80.0–100.0)
Monocytes Absolute: 1 10*3/uL (ref 0.1–1.0)
Monocytes Relative: 11 %
Neutro Abs: 6.7 10*3/uL (ref 1.7–7.7)
Neutrophils Relative %: 70 %
Platelets: 482 10*3/uL — ABNORMAL HIGH (ref 150–400)
RBC: 3.16 MIL/uL — ABNORMAL LOW (ref 3.87–5.11)
RDW: 15.5 % (ref 11.5–15.5)
WBC: 9.4 10*3/uL (ref 4.0–10.5)
nRBC: 0 % (ref 0.0–0.2)

## 2024-04-12 MED ORDER — MENTHOL 3 MG MT LOZG
1.0000 | LOZENGE | OROMUCOSAL | Status: DC | PRN
  Administered 2024-04-12: 3 mg via ORAL
  Filled 2024-04-12: qty 9

## 2024-04-12 MED ORDER — CHLORHEXIDINE GLUCONATE CLOTH 2 % EX PADS
6.0000 | MEDICATED_PAD | Freq: Every day | CUTANEOUS | Status: DC
  Administered 2024-04-12 – 2024-04-15 (×3): 6 via TOPICAL

## 2024-04-12 MED ORDER — BISACODYL 5 MG PO TBEC
5.0000 mg | DELAYED_RELEASE_TABLET | Freq: Every day | ORAL | Status: DC | PRN
  Administered 2024-04-12 – 2024-04-13 (×2): 5 mg via ORAL
  Filled 2024-04-12 (×2): qty 1

## 2024-04-12 NOTE — Progress Notes (Signed)
 PROGRESS NOTE    LISETH WANN  BJY:[id number] DOB: [birth year] DOA: 04/09/2024 PCP: Emaline Handsome, MD   Brief Narrative:  HPI: Melissa [name] is a 58 y.o. female with medical history significant of HTN, IBS, GERD, hypothyroidism, pre-diabetes, congenital left kidney atrophy who presented to the emergency department twice in the last week for same issues.  She presented 6/11 with left-sided flank pain.  She was diagnosed with pyelonephritis and discharged on Keflex .  She presented again 6/11 to the emergency department with left-sided flank pain.  She was given Flomax  and discharged home.  She went to her primary care physician at [facility] earlier today, was instructed to seek care in the emergency department as she had no improvement on oral antibiotics.   She admits to left-sided flank pain, describes it as severe pain as though she is getting stabbed by a burning branding iron.  Also had some fevers at home.  Nausea and vomiting.  She states that she was diagnosed with congenital kidney disease on the left side when she was 58 years old.    She also admits that she has been under significant stress lately due to sudden passing of her friend's adult child.    ED Course: Labs obtained which reveal hypokalemia 2.8.  UA obtained.  CT abdomen pelvis results as below.  Urology consulted.  Patient given IV Rocephin .  Assessment & Plan:   Principal Problem:   Acute pyelonephritis Active Problems:   Obesity (BMI 30-39.9)   Acquired hypothyroidism   Atrophy of left kidney   Prediabetes   HLD (hyperlipidemia)   Depression   Hypokalemia   HTN (hypertension)   Anxiety  Acute pyelonephritis with hydronephrosis: 2 visits to ED and discharged home on oral antibiotic, continued to have pain despite of taking antibiotics so showed up to the ED again. CT A/P showed chronic severe hydronephrosis on left with chronic left UPJ obstruction.  UA consistent with UTI.  Started on IV Rocephin .  Urology  on board.  Per them, will eventually need left nephrectomy however patient has scheduled bilateral knee replacements in next couple of months so they are planning this electively as outpatient in December.  Patient aware of the plan.  He spiked a fever of 100.7 at 8 PM on the night of 04/11/2024.  Thankfully she is afebrile since then.  Leukocytosis resolved as well.  Urology following.  Foley catheter was inserted on 04/11/2024.  Her pain is still actually slightly worse and 8 out of 10.  Due to pain, urology recommends percutaneous nephrostomy tube possibly on Monday and recommends n.p.o. from Sunday night.  Hypokalemia: Resolved.  Acquired hypothyroidism: Continue Synthroid .  Essential hypertension: Controlled.  Continue Cozaar .  Hyperlipidemia: Continue Crestor.  GERD: Continue PPI  IBS: Continue Bentyl.  Mood disorder: Continue  Prediabetes: On metformin  PTA.  No need for SSI.  Morbid obesity class II: BMI 38.  Weight loss and diet modification counseled.  DVT prophylaxis: enoxaparin (LOVENOX) injection 40 mg Start: 04/09/24 2200   Code Status: Full Code  Family Communication:  None present at bedside.  Plan of care discussed with patient in length and he/she verbalized understanding and agreed with it.  Status is: Inpatient Remains inpatient appropriate because: Still with pain due to hydronephrosis, may need nephrostomy tube.   Estimated body mass index is 38.71 kg/m as calculated from the following:   Height as of 04/03/24: 5' 2 (1.575 m).   Weight as of 04/03/24: 96 kg.    Nutritional Assessment:  There is no height or weight on file to calculate BMI.. Seen by dietician.  I agree with the assessment and plan as outlined below: Nutrition Status:        . Skin Assessment: I have examined the patient's skin and I agree with the wound assessment as performed by the wound care RN as outlined below:    Consultants:  Urology  Procedures:  None  Antimicrobials:   Anti-infectives (From admission, onward)    Start     Dose/Rate Route Frequency Ordered Stop   04/10/24 1000  cefTRIAXone  (ROCEPHIN ) 2 g in sodium chloride  0.9 % 100 mL IVPB        2 g 200 mL/hr over 30 Minutes Intravenous Every 24 hours 04/09/24 1811     04/09/24 1145  cefTRIAXone  (ROCEPHIN ) 2 g in sodium chloride  0.9 % 100 mL IVPB        2 g 200 mL/hr over 30 Minutes Intravenous  Once 04/09/24 1138 04/09/24 1244         Subjective: Patient seen and examined.  She complains of more pain compared to yesterday.  No other complaint.  Objective: Vitals:   04/11/24 0508 04/11/24 1239 04/11/24 2100 04/12/24 0301  BP: (!) 119/57 (!) 120/55 136/67 127/73  Pulse: 81 82 88 90  Resp: 18 17 15 15   Temp: 99.6 F (37.6 C) 98.4 F (36.9 C) 99 F (37.2 C) 98.6 F (37 C)  TempSrc: Oral Oral Oral Oral  SpO2: 95% 99% 97% 100%    Intake/Output Summary (Last 24 hours) at 04/12/2024 0811 Last data filed at 04/11/2024 2330 Gross per 24 hour  Intake 370 ml  Output 1150 ml  Net -780 ml   There were no vitals filed for this visit.  Examination:  General exam: Appears calm and comfortable  Respiratory system: Clear to auscultation. Respiratory effort normal. Cardiovascular system: S1 & S2 heard, RRR. No JVD, murmurs, rubs, gallops or clicks. No pedal edema. Gastrointestinal system: Abdomen is nondistended, soft and left CVA tenderness.  No organomegaly or masses felt. Normal bowel sounds heard. Central nervous system: Alert and oriented. No focal neurological deficits. Extremities: Symmetric 5 x 5 power. Skin: No rashes, lesions or ulcers.  Psychiatry: Judgement and insight appear normal. Mood & affect appropriate.   Data Reviewed: I have personally reviewed following labs and imaging studies  CBC: Recent Labs  Lab 04/09/24 1205 04/10/24 0502 04/11/24 0817 04/12/24 0506  WBC 7.5 8.8 11.2* 9.4  NEUTROABS 4.9  --  8.0* 6.7  HGB 10.5* 9.5* 8.8* 8.6*  HCT 33.1* 30.0* 29.1* 27.9*   MCV 87.1 89.3 89.3 88.3  PLT 436* 378 476* 482*   Basic Metabolic Panel: Recent Labs  Lab 04/09/24 1205 04/09/24 1326 04/10/24 0502 04/11/24 0817 04/12/24 0506  NA 137  --  134* 136 137  K 2.8*  --  3.6 3.2* 3.9  CL 98  --  100 101 100  CO2 28  --  24 27 26   GLUCOSE 120*  --  122* 141* 125*  BUN 8  --  9 7 8   CREATININE 0.65  --  0.65 0.66 0.66  CALCIUM 9.0  --  8.2* 8.4* 8.3*  MG  --  1.8  --   --   --    GFR: Estimated Creatinine Clearance: 80.9 mL/min (by C-G formula based on SCr of 0.66 mg/dL). Liver Function Tests: Recent Labs  Lab 04/09/24 1205  AST 30  ALT 23  ALKPHOS 158*  BILITOT 0.7  PROT  7.2  ALBUMIN 3.0*   Recent Labs  Lab 04/09/24 1205  LIPASE 19   No results for input(s): AMMONIA in the last 168 hours. Coagulation Profile: No results for input(s): INR, PROTIME in the last 168 hours. Cardiac Enzymes: No results for input(s): CKTOTAL, CKMB, CKMBINDEX, TROPONINI in the last 168 hours. BNP (last 3 results) No results for input(s): PROBNP in the last 8760 hours. HbA1C: No results for input(s): HGBA1C in the last 72 hours. CBG: No results for input(s): GLUCAP in the last 168 hours. Lipid Profile: No results for input(s): CHOL, HDL, LDLCALC, TRIG, CHOLHDL, LDLDIRECT in the last 72 hours. Thyroid  Function Tests: No results for input(s): TSH, T4TOTAL, FREET4, T3FREE, THYROIDAB in the last 72 hours. Anemia Panel: Recent Labs    04/09/24 1300  VITAMINB12 1,003*  FOLATE 13.2  FERRITIN 216  TIBC 272  IRON 14*   Sepsis Labs: Recent Labs  Lab 04/09/24 1155 04/09/24 1419  LATICACIDVEN 1.6 1.4    Recent Results (from the past 240 hours)  Urine Culture     Status: Abnormal   Collection Time: 04/03/24  2:50 PM   Specimen: Urine, Clean Catch  Result Value Ref Range Status   Specimen Description   Final    URINE, CLEAN CATCH Performed at [facility], [address]  [place], [place] [address]    Special Requests   Final    NONE Performed at [facility], [address] [place], [place] [address]    Culture >=100,000 COLONIES/mL KLEBSIELLA PNEUMONIAE (A)  Final   Report Status 04/05/2024 FINAL  Final   Organism ID, Bacteria KLEBSIELLA PNEUMONIAE (A)  Final      Susceptibility   Klebsiella pneumoniae - MIC*    AMPICILLIN >=32 RESISTANT Resistant     CEFAZOLIN  <=4 SENSITIVE Sensitive     CEFEPIME <=0.12 SENSITIVE Sensitive     CEFTRIAXONE  <=0.25 SENSITIVE Sensitive     CIPROFLOXACIN 0.5 INTERMEDIATE Intermediate     GENTAMICIN <=1 SENSITIVE Sensitive     IMIPENEM <=0.25 SENSITIVE Sensitive     NITROFURANTOIN 64 INTERMEDIATE Intermediate     TRIMETH/SULFA >=320 RESISTANT Resistant     AMPICILLIN/SULBACTAM 16 INTERMEDIATE Intermediate     PIP/TAZO <=4 SENSITIVE Sensitive ug/mL    * >=100,000 COLONIES/mL KLEBSIELLA PNEUMONIAE  Culture, blood (routine x 2)     Status: None (Preliminary result)   Collection Time: 04/09/24 11:55 AM   Specimen: BLOOD  Result Value Ref Range Status   Specimen Description   Final    BLOOD LEFT ANTECUBITAL Performed at [facility], [address], [place], [place] [address]    Special Requests   Final    BOTTLES DRAWN AEROBIC AND ANAEROBIC Blood Culture adequate volume Performed at [facility], [address], [place], [place] [address]    Culture   Final    NO GROWTH 2 DAYS Performed at [facility], [address] [place], [place] [address]    Report Status PENDING  Incomplete  Culture, blood (routine x 2)     Status: None (Preliminary result)   Collection Time: 04/09/24 11:56 AM   Specimen: BLOOD  Result Value Ref Range Status   Specimen Description   Final    BLOOD RIGHT ANTECUBITAL Performed at [facility], [address] [place], [place] [address]    Special Requests   Final    BOTTLES DRAWN AEROBIC AND ANAEROBIC Blood  Culture adequate volume Performed at [facility],  [address] [place], [place] [address]    Culture   Final    NO GROWTH 2 DAYS Performed at [facility], [address] [place], [place] [address]    Report Status PENDING  Incomplete  Urine Culture     Status: None   Collection Time: 04/09/24  2:26 PM   Specimen: Urine, Clean Catch  Result Value Ref Range Status   Specimen Description   Final    URINE, CLEAN CATCH Performed at [facility], [address], [place], [place] [address]    Special Requests   Final    NONE Reflexed from [phone] Performed at [facility], [address], [place], [place] [address]    Culture   Final    NO GROWTH Performed at [facility], [address] [place], [place] [address]    Report Status 04/10/2024 FINAL  Final     Radiology Studies: No results found.   Scheduled Meds:  buPROPion   300 mg Oral q morning   busPIRone   5 mg Oral BID   dicyclomine  10 mg Oral TID AC & HS   enoxaparin (LOVENOX) injection  40 mg Subcutaneous Q24H   famotidine   20 mg Oral BID   FLUoxetine   40 mg Oral Daily   hydrOXYzine   25 mg Oral Q6H   levothyroxine   125 mcg Oral QAC breakfast   losartan   25 mg Oral Daily   pantoprazole   80 mg Oral Daily   rosuvastatin  10 mg Oral Daily   Continuous Infusions:  cefTRIAXone  (ROCEPHIN )  IV 2 g (04/11/24 1006)     LOS: 3 days   Modena Andes, MD [facility]  04/12/2024, 8:11 AM   *Please note that this is a verbal dictation therefore any spelling or grammatical errors are due to the Dragon Medical One system interpretation.  Please page via Amion and do not message via secure chat for urgent patient care matters. Secure chat can be used for non urgent patient care matters.  How to contact the [facility] Attending or Consulting provider 7A - 7P or covering provider during after hours 7P -7A, for this patient?  Check the care team in [facility] and look for a)  attending/consulting [facility] provider listed and b) the [facility] team listed. Page or secure chat 7A-7P. Log into [email or web address] and use [facility]'s universal password to access. If you do not have the password, please contact the hospital operator. Locate the [facility] provider you are looking for under [facility] and page to a number that you can be directly reached. If you still have difficulty reaching the provider, please page the [facility] (Director on Call) for the Hospitalists listed on amion for assistance.

## 2024-04-12 NOTE — Progress Notes (Signed)
     Subjective: Patient reports increased flank pain and fever overnight.  Reviewed case and plan in the presence of one of her friends.  Objective: Vital signs in last 24 hours: Temp:  [98.4 F (36.9 C)-99 F (37.2 C)] 98.6 F (37 C) (06/20 0301) Pulse Rate:  [82-90] 90 (06/20 0301) Resp:  [15-17] 15 (06/20 0301) BP: (120-136)/(55-73) 127/73 (06/20 0301) SpO2:  [97 %-100 %] 100 % (06/20 0301)  Assessment/Plan: # Flank pain # Atrophied left kidney with vesicoureteral reflux  Will eventually need left nephrectomy.  Discussed with Dr. Cam on an outpatient basis with a goal of around December.  Patient has bilateral knee replacements pending.  Preserved renal function  Foley placed yesterday. Thankfully she has remained normothermic but she reports that pain is unchanged. Ureteral stent, if able to be placed, would likely worsen her colic.  We again discussed percutaneous nephrostomy tube.  She is receptive to proceeding with that but we discussed that the risk that would come with contaminating indwelling hardware from her knee replacements would be unacceptable and that percutaneous nephrostomy tube would likely necessitate postponing her knee surgeries until after her nephrectomy.  She is receptive to that if necessary.  The shared decision was made to keep Foley catheter in place for the weekend and see if there is some improvement in symptoms.  Will consider renal ultrasound on Monday and IR placement of PCNT.  N.p.o. at midnight on Sunday  Will follow  Intake/Output from previous day: 06/19 0701 - 06/20 0700 In: 590 [P.O.:590] Out: 1150 [Urine:1150]  Intake/Output this shift: No intake/output data recorded.  Physical Exam:  General: Alert and oriented CV: No cyanosis Lungs: equal chest rise    Lab Results: Recent Labs    04/10/24 0502 04/11/24 0817 04/12/24 0506  HGB 9.5* 8.8* 8.6*  HCT 30.0* 29.1* 27.9*   BMET Recent Labs    04/11/24 0817  04/12/24 0506  NA 136 137  K 3.2* 3.9  CL 101 100  CO2 27 26  GLUCOSE 141* 125*  BUN 7 8  CREATININE 0.66 0.66  CALCIUM  8.4* 8.3*  HGB 8.8* 8.6*  WBC 11.2* 9.4     Studies/Results: No results found.     LOS: 3 days   Ole Bourdon, NP [facility] Pager: [phone]  04/12/2024, 7:43 AM

## 2024-04-13 DIAGNOSIS — N10 Acute tubulo-interstitial nephritis: Secondary | ICD-10-CM | POA: Diagnosis not present

## 2024-04-13 MED ORDER — NYSTATIN 100000 UNIT/ML MT SUSP
5.0000 mL | Freq: Four times a day (QID) | OROMUCOSAL | Status: DC
  Administered 2024-04-13 – 2024-04-17 (×17): 500000 [IU] via ORAL
  Filled 2024-04-13 (×17): qty 5

## 2024-04-13 MED ORDER — MAGIC MOUTHWASH W/LIDOCAINE
10.0000 mL | Freq: Three times a day (TID) | ORAL | Status: DC | PRN
  Administered 2024-04-13 – 2024-04-14 (×3): 10 mL via ORAL
  Filled 2024-04-13 (×4): qty 10

## 2024-04-13 NOTE — Progress Notes (Signed)
 PROGRESS NOTE    Melissa [name]  FMW:[id number] DOB: June 10, 1966 DOA: 04/09/2024 PCP: Sophronia Ozell JAYSON, MD   Brief Narrative:  HPI: Melissa [name] is a 58 y.o. female with medical history significant of HTN, IBS, GERD, hypothyroidism, pre-diabetes, congenital left kidney atrophy who presented to the emergency department twice in the last week for same issues.  She presented 6/11 with left-sided flank pain.  She was diagnosed with pyelonephritis and discharged on Keflex .  She presented again 6/11 to the emergency department with left-sided flank pain.  She was given Flomax  and discharged home.  She went to her primary care physician at [facility] earlier today, was instructed to seek care in the emergency department as she had no improvement on oral antibiotics.   She admits to left-sided flank pain, describes it as severe pain as though she is getting stabbed by a burning branding iron.  Also had some fevers at home.  Nausea and vomiting.  She states that she was diagnosed with congenital kidney disease on the left side when she was 58 years old.    She also admits that she has been under significant stress lately due to sudden passing of her friend's adult child.    ED Course: Labs obtained which reveal hypokalemia 2.8.  UA obtained.  CT abdomen pelvis results as below.  Urology consulted.  Patient given IV Rocephin .  Assessment & Plan:   Principal Problem:   Acute pyelonephritis Active Problems:   Obesity (BMI 30-39.9)   Acquired hypothyroidism   Atrophy of left kidney   Prediabetes   HLD (hyperlipidemia)   Depression   Hypokalemia   HTN (hypertension)   Anxiety  Acute pyelonephritis with hydronephrosis: 2 visits to ED and discharged home on oral antibiotic, continued to have pain despite of taking antibiotics so showed up to the ED again. CT A/P showed chronic severe hydronephrosis on left with chronic left UPJ obstruction.  UA consistent with UTI.  Started on IV Rocephin .  Urology  on board.  Per them, will eventually need left nephrectomy however patient has scheduled bilateral knee replacements in next couple of months so they are planning this electively as outpatient in December.  Patient aware of the plan.  He spiked a fever of 100.7 at 8 PM on the night of 04/11/2024.  Thankfully she is afebrile since then.  Leukocytosis resolved as well.  Urology following.  Foley catheter was inserted on 04/11/2024.  She complains of pain 9 out of 10 when the pain medications wear out.  Due to pain, urology recommends percutaneous nephrostomy tube possibly on Monday and recommends n.p.o. from Sunday night.  Oropharyngeal candidiasis: Nystatin  swish and swallow 4 times daily.  Hypokalemia: Resolved.  Acquired hypothyroidism: Continue Synthroid .  Essential hypertension: Controlled.  Continue Cozaar .  Hyperlipidemia: Continue Crestor .  GERD: Continue PPI  IBS: Continue Bentyl .  Mood disorder: Continue  Prediabetes: On metformin  PTA.  No need for SSI.  Morbid obesity class II: BMI 38.  Weight loss and diet modification counseled.  DVT prophylaxis: enoxaparin  (LOVENOX ) injection 40 mg Start: 04/09/24 2200   Code Status: Full Code  Family Communication:  None present at bedside.  Plan of care discussed with patient in length and he/she verbalized understanding and agreed with it.  Status is: Inpatient Remains inpatient appropriate because: Still with pain due to hydronephrosis, may need nephrostomy tube.   Estimated body mass index is 38.71 kg/m as calculated from the following:   Height as of 04/03/24: 5' 2 (1.575 m).  Weight as of 04/03/24: 96 kg.    Nutritional Assessment: There is no height or weight on file to calculate BMI.. Seen by dietician.  I agree with the assessment and plan as outlined below: Nutrition Status:        . Skin Assessment: I have examined the patient's skin and I agree with the wound assessment as performed by the wound care RN as outlined  below:    Consultants:  Urology  Procedures:  None  Antimicrobials:  Anti-infectives (From admission, onward)    Start     Dose/Rate Route Frequency Ordered Stop   04/10/24 1000  cefTRIAXone  (ROCEPHIN ) 2 g in sodium chloride  0.9 % 100 mL IVPB        2 g 200 mL/hr over 30 Minutes Intravenous Every 24 hours 04/09/24 1811     04/09/24 1145  cefTRIAXone  (ROCEPHIN ) 2 g in sodium chloride  0.9 % 100 mL IVPB        2 g 200 mL/hr over 30 Minutes Intravenous  Once 04/09/24 1138 04/09/24 1244         Subjective: Patient seen and examined.  Still complains of left flank pain.  She says that the pain is usually 4 out of 10 after getting the pain medications but goes up to 9 out of 10 once the pain medications effect wears off.  She is complaining of sore throat and sore tongue. Objective: Vitals:   04/12/24 1304 04/12/24 1554 04/12/24 2001 04/13/24 0322  BP: (!) 111/55 (!) 111/57 134/67 133/67  Pulse: 84 86 85 84  Resp: 16 18 16 15   Temp: 98.6 F (37 C) 98.9 F (37.2 C) 99.1 F (37.3 C) 98.8 F (37.1 C)  TempSrc: Oral  Oral Oral  SpO2: 98% 96% 99% 96%    Intake/Output Summary (Last 24 hours) at 04/13/2024 1001 Last data filed at 04/13/2024 0820 Gross per 24 hour  Intake 360 ml  Output 1750 ml  Net -1390 ml   There were no vitals filed for this visit.  Examination:  General exam: Appears calm and comfortable, white cheesy material noted on the tongue consistent with candidiasis. Respiratory system: Clear to auscultation. Respiratory effort normal. Cardiovascular system: S1 & S2 heard, RRR. No JVD, murmurs, rubs, gallops or clicks. No pedal edema. Gastrointestinal system: Abdomen is nondistended, soft and moderate to severe left CVA tenderness.  No organomegaly or masses felt. Normal bowel sounds heard. Central nervous system: Alert and oriented. No focal neurological deficits. Extremities: Symmetric 5 x 5 power. Skin: No rashes, lesions or ulcers.  Psychiatry: Judgement and  insight appear normal. Mood & affect appropriate.   Data Reviewed: I have personally reviewed following labs and imaging studies  CBC: Recent Labs  Lab 04/09/24 1205 04/10/24 0502 04/11/24 0817 04/12/24 0506  WBC 7.5 8.8 11.2* 9.4  NEUTROABS 4.9  --  8.0* 6.7  HGB 10.5* 9.5* 8.8* 8.6*  HCT 33.1* 30.0* 29.1* 27.9*  MCV 87.1 89.3 89.3 88.3  PLT 436* 378 476* 482*   Basic Metabolic Panel: Recent Labs  Lab 04/09/24 1205 04/09/24 1326 04/10/24 0502 04/11/24 0817 04/12/24 0506  NA 137  --  134* 136 137  K 2.8*  --  3.6 3.2* 3.9  CL 98  --  100 101 100  CO2 28  --  24 27 26   GLUCOSE 120*  --  122* 141* 125*  BUN 8  --  9 7 8   CREATININE 0.65  --  0.65 0.66 0.66  CALCIUM  9.0  --  8.2* 8.4* 8.3*  MG  --  1.8  --   --   --    GFR: Estimated Creatinine Clearance: 80.9 mL/min (by C-G formula based on SCr of 0.66 mg/dL). Liver Function Tests: Recent Labs  Lab 04/09/24 1205  AST 30  ALT 23  ALKPHOS 158*  BILITOT 0.7  PROT 7.2  ALBUMIN 3.0*   Recent Labs  Lab 04/09/24 1205  LIPASE 19   No results for input(s): AMMONIA in the last 168 hours. Coagulation Profile: No results for input(s): INR, PROTIME in the last 168 hours. Cardiac Enzymes: No results for input(s): CKTOTAL, CKMB, CKMBINDEX, TROPONINI in the last 168 hours. BNP (last 3 results) No results for input(s): PROBNP in the last 8760 hours. HbA1C: No results for input(s): HGBA1C in the last 72 hours. CBG: No results for input(s): GLUCAP in the last 168 hours. Lipid Profile: No results for input(s): CHOL, HDL, LDLCALC, TRIG, CHOLHDL, LDLDIRECT in the last 72 hours. Thyroid  Function Tests: No results for input(s): TSH, T4TOTAL, FREET4, T3FREE, THYROIDAB in the last 72 hours. Anemia Panel: No results for input(s): VITAMINB12, FOLATE, FERRITIN, TIBC, IRON, RETICCTPCT in the last 72 hours.  Sepsis Labs: Recent Labs  Lab 04/09/24 1155 04/09/24 1419   LATICACIDVEN 1.6 1.4    Recent Results (from the past 240 hours)  Urine Culture     Status: Abnormal   Collection Time: 04/03/24  2:50 PM   Specimen: Urine, Clean Catch  Result Value Ref Range Status   Specimen Description   Final    URINE, CLEAN CATCH Performed at [facility], [address] [place], [place] [address]    Special Requests   Final    NONE Performed at [facility], [address], [place], [place] [address]    Culture >=100,000 COLONIES/mL KLEBSIELLA PNEUMONIAE (A)  Final   Report Status 04/05/2024 FINAL  Final   Organism ID, Bacteria KLEBSIELLA PNEUMONIAE (A)  Final      Susceptibility   Klebsiella pneumoniae - MIC*    AMPICILLIN >=32 RESISTANT Resistant     CEFAZOLIN  <=4 SENSITIVE Sensitive     CEFEPIME <=0.12 SENSITIVE Sensitive     CEFTRIAXONE  <=0.25 SENSITIVE Sensitive     CIPROFLOXACIN 0.5 INTERMEDIATE Intermediate     GENTAMICIN <=1 SENSITIVE Sensitive     IMIPENEM <=0.25 SENSITIVE Sensitive     NITROFURANTOIN 64 INTERMEDIATE Intermediate     TRIMETH/SULFA >=320 RESISTANT Resistant     AMPICILLIN/SULBACTAM 16 INTERMEDIATE Intermediate     PIP/TAZO <=4 SENSITIVE Sensitive ug/mL    * >=100,000 COLONIES/mL KLEBSIELLA PNEUMONIAE  Culture, blood (routine x 2)     Status: None (Preliminary result)   Collection Time: 04/09/24 11:55 AM   Specimen: BLOOD  Result Value Ref Range Status   Specimen Description   Final    BLOOD LEFT ANTECUBITAL Performed at [facility], [address] [place], [place] [address]    Special Requests   Final    BOTTLES DRAWN AEROBIC AND ANAEROBIC Blood Culture adequate volume Performed at [facility], [address], [place], [place] [address]    Culture   Final    NO GROWTH 4 DAYS Performed at [facility], [address], [place], [place] [address]    Report Status PENDING  Incomplete  Culture, blood (routine x 2)     Status: None  (Preliminary result)   Collection Time: 04/09/24 11:56 AM   Specimen: BLOOD  Result Value Ref Range Status  Specimen Description   Final    BLOOD RIGHT ANTECUBITAL Performed at [facility], [address] [place], [place] [address]    Special Requests   Final    BOTTLES DRAWN AEROBIC AND ANAEROBIC Blood Culture adequate volume Performed at [facility], [address] [place], [place] [address]    Culture   Final    NO GROWTH 4 DAYS Performed at [facility], [address], [place], [place] [address]    Report Status PENDING  Incomplete  Urine Culture     Status: None   Collection Time: 04/09/24  2:26 PM   Specimen: Urine, Clean Catch  Result Value Ref Range Status   Specimen Description   Final    URINE, CLEAN CATCH Performed at [facility], [address], [place], [place] [address]    Special Requests   Final    NONE Reflexed from [id number] Performed at [facility], [address] [place], [place] [address]    Culture   Final    NO GROWTH Performed at [facility], [address], [place], [place] [address]    Report Status 04/10/2024 FINAL  Final     Radiology Studies: No results found.   Scheduled Meds:  buPROPion   300 mg Oral q morning   busPIRone   5 mg Oral BID   Chlorhexidine  Gluconate Cloth  6 each Topical Daily   dicyclomine   10 mg Oral TID AC & HS   enoxaparin  (LOVENOX ) injection  40 mg Subcutaneous Q24H   famotidine   20 mg Oral BID   FLUoxetine   40 mg Oral Daily   hydrOXYzine   25 mg Oral Q6H   levothyroxine   125 mcg Oral QAC breakfast   losartan   25 mg Oral Daily   nystatin   5 mL Oral QID   pantoprazole   80 mg Oral Daily   rosuvastatin   10 mg Oral Daily   Continuous Infusions:  cefTRIAXone  (ROCEPHIN )  IV 2 g (04/13/24 0842)     LOS: 4 days   Fredia Skeeter, MD [facility]  04/13/2024, 10:01 AM   *Please note that this is a verbal dictation therefore any  spelling or grammatical errors are due to the Dragon Medical One system interpretation.  Please page via Amion and do not message via secure chat for urgent patient care matters. Secure chat can be used for non urgent patient care matters.  How to contact the [facility] Attending or Consulting provider 7A - 7P or covering provider during after hours 7P -7A, for this patient?  Check the care team in [facility] and look for a) attending/consulting [facility] provider listed and b) the [facility] team listed. Page or secure chat 7A-7P. Log into [email or web address] and use [place]'s universal password to access. If you do not have the password, please contact the hospital operator. Locate the [facility] provider you are looking for under [facility] and page to a number that you can be directly reached. If you still have difficulty reaching the provider, please page the [facility] (Director on Call) for the Hospitalists listed on amion for assistance.

## 2024-04-13 NOTE — Progress Notes (Signed)
 Patient complaining of chills and not feeling well. Her temp was 99.2 orally and bp slightly elevated in 140's compared to her normal of around 100. She said she was having increased discomfort and burning from catheter. Tried paging urology and did not receive page back. Patient said she could not take it anymore and wanted the foley out so it was removed.

## 2024-04-14 DIAGNOSIS — N10 Acute tubulo-interstitial nephritis: Secondary | ICD-10-CM | POA: Diagnosis not present

## 2024-04-14 LAB — RETIC PANEL
Immature Retic Fract: 15.6 % (ref 2.3–15.9)
RBC.: 3.1 MIL/uL — ABNORMAL LOW (ref 3.87–5.11)
Retic Count, Absolute: 27.6 10*3/uL (ref 19.0–186.0)
Retic Ct Pct: 0.9 % (ref 0.4–3.1)
Reticulocyte Hemoglobin: 21.2 pg — ABNORMAL LOW (ref >27.9)

## 2024-04-14 LAB — CBC WITH DIFFERENTIAL/PLATELET
Abs Immature Granulocytes: 0.16 10*3/uL — ABNORMAL HIGH (ref 0.00–0.07)
Basophils Absolute: 0 10*3/uL (ref 0.0–0.1)
Basophils Relative: 0 %
Eosinophils Absolute: 0.1 10*3/uL (ref 0.0–0.5)
Eosinophils Relative: 1 %
HCT: 27.1 % — ABNORMAL LOW (ref 36.0–46.0)
Hemoglobin: 8.3 g/dL — ABNORMAL LOW (ref 12.0–15.0)
Immature Granulocytes: 1 %
Lymphocytes Relative: 8 %
Lymphs Abs: 1 10*3/uL (ref 0.7–4.0)
MCH: 26.9 pg (ref 26.0–34.0)
MCHC: 30.6 g/dL (ref 30.0–36.0)
MCV: 88 fL (ref 80.0–100.0)
Monocytes Absolute: 1.2 10*3/uL — ABNORMAL HIGH (ref 0.1–1.0)
Monocytes Relative: 10 %
Neutro Abs: 10.1 10*3/uL — ABNORMAL HIGH (ref 1.7–7.7)
Neutrophils Relative %: 80 %
Platelets: 570 10*3/uL — ABNORMAL HIGH (ref 150–400)
RBC: 3.08 MIL/uL — ABNORMAL LOW (ref 3.87–5.11)
RDW: 15.5 % (ref 11.5–15.5)
WBC: 12.7 10*3/uL — ABNORMAL HIGH (ref 4.0–10.5)
nRBC: 0 % (ref 0.0–0.2)

## 2024-04-14 LAB — BASIC METABOLIC PANEL WITH GFR
Anion gap: 12 (ref 5–15)
BUN: 7 mg/dL (ref 6–20)
CO2: 23 mmol/L (ref 22–32)
Calcium: 8.4 mg/dL — ABNORMAL LOW (ref 8.9–10.3)
Chloride: 101 mmol/L (ref 98–111)
Creatinine, Ser: 0.72 mg/dL (ref 0.44–1.00)
GFR, Estimated: >60 mL/min (ref >60)
Glucose, Bld: 126 mg/dL — ABNORMAL HIGH (ref 70–99)
Potassium: 3.6 mmol/L (ref 3.5–5.1)
Sodium: 136 mmol/L (ref 135–145)

## 2024-04-14 LAB — IRON AND TIBC
Iron: 26 ug/dL — ABNORMAL LOW (ref 28–170)
Saturation Ratios: 11 % (ref 10.4–31.8)
TIBC: 245 ug/dL — ABNORMAL LOW (ref 250–450)
UIBC: 219 ug/dL

## 2024-04-14 LAB — CULTURE, BLOOD (ROUTINE X 2)
Culture: NO GROWTH
Culture: NO GROWTH
Special Requests: ADEQUATE
Special Requests: ADEQUATE

## 2024-04-14 LAB — FERRITIN: Ferritin: 509 ng/mL — ABNORMAL HIGH (ref 11–307)

## 2024-04-14 NOTE — Progress Notes (Signed)
 PROGRESS NOTE    Melissa HOEFER  FMW:[id number] DOB: 09-12-1966 DOA: 04/09/2024 PCP: Sophronia Ozell JAYSON, MD   Brief Narrative:  HPI: Melissa [name] is a 58 y.o. female with medical history significant of HTN, IBS, GERD, hypothyroidism, pre-diabetes, congenital left kidney atrophy who presented to the emergency department twice in the last week for same issues.  She presented 6/11 with left-sided flank pain.  She was diagnosed with pyelonephritis and discharged on Keflex .  She presented again 6/11 to the emergency department with left-sided flank pain.  She was given Flomax  and discharged home.  She went to her primary care physician at [facility] earlier today, was instructed to seek care in the emergency department as she had no improvement on oral antibiotics.   She admits to left-sided flank pain, describes it as severe pain as though she is getting stabbed by a burning branding iron.  Also had some fevers at home.  Nausea and vomiting.  She states that she was diagnosed with congenital kidney disease on the left side when she was 58 years old.    She also admits that she has been under significant stress lately due to sudden passing of her friend's adult child.    ED Course: Labs obtained which reveal hypokalemia 2.8.  UA obtained.  CT abdomen pelvis results as below.  Urology consulted.  Patient given IV Rocephin .  Assessment & Plan:   Principal Problem:   Acute pyelonephritis Active Problems:   Obesity (BMI 30-39.9)   Acquired hypothyroidism   Atrophy of left kidney   Prediabetes   HLD (hyperlipidemia)   Depression   Hypokalemia   HTN (hypertension)   Anxiety  Acute pyelonephritis with hydronephrosis: 2 visits to ED and discharged home on oral antibiotic, continued to have pain despite of taking antibiotics so showed up to the ED again. CT A/P showed chronic severe hydronephrosis on left with chronic left UPJ obstruction.  UA consistent with UTI.  Started on IV Rocephin .  Urology  on board.  Per them, will eventually need left nephrectomy however patient has scheduled bilateral knee replacements in next couple of months so they are planning this electively as outpatient in December.  Patient aware of the plan.  He spiked a fever of 100.7 at 8 PM on the night of 04/11/2024.  Thankfully she is afebrile since then.  Leukocytosis resolved as well.  Urology following.  Foley catheter was inserted on 04/11/2024.  Last night, she felt discomfort due to catheter and requested catheter removal which was removed per her request on the evening of 04/13/2024.  Patient has been voiding successfully after that.  Still with pain, lowest 5 out of 10, highest 8 out of 10.  Will keep her n.p.o. from midnight.  Urology recommendation for potential percutaneous nephrostomy tube tomorrow.  Acute anemia: Her hemoglobin was totally normal last year.  Her hemoglobin was 11.4 on 04/03/2024 and 10.5 on day of admission here which has been gradually declining and down to 8.3.  Normal MCV.  Will expand anemia workup.  Oropharyngeal candidiasis: Nystatin  swish and swallow 4 times daily.  Hypokalemia: Resolved.  Acquired hypothyroidism: Continue Synthroid .  Essential hypertension: Controlled.  Continue Cozaar .  Hyperlipidemia: Continue Crestor .  GERD: Continue PPI  IBS: Continue Bentyl .  Mood disorder: Continue  Prediabetes: On metformin  PTA.  No need for SSI.  Morbid obesity class II: BMI 38.  Weight loss and diet modification counseled.  DVT prophylaxis: enoxaparin  (LOVENOX ) injection 40 mg Start: 04/09/24 2200   Code Status:  Full Code  Family Communication:  None present at bedside.  Plan of care discussed with patient in length and he/she verbalized understanding and agreed with it.  Status is: Inpatient Remains inpatient appropriate because: Still with pain due to hydronephrosis, may need nephrostomy tube.   Estimated body mass index is 38.71 kg/m as calculated from the following:    Height as of 04/03/24: 5' 2 (1.575 m).   Weight as of 04/03/24: 96 kg.    Nutritional Assessment: There is no height or weight on file to calculate BMI.. Seen by dietician.  I agree with the assessment and plan as outlined below: Nutrition Status:        . Skin Assessment: I have examined the patient's skin and I agree with the wound assessment as performed by the wound care RN as outlined below:    Consultants:  Urology  Procedures:  None  Antimicrobials:  Anti-infectives (From admission, onward)    Start     Dose/Rate Route Frequency Ordered Stop   04/10/24 1000  cefTRIAXone  (ROCEPHIN ) 2 g in sodium chloride  0.9 % 100 mL IVPB        2 g 200 mL/hr over 30 Minutes Intravenous Every 24 hours 04/09/24 1811     04/09/24 1145  cefTRIAXone  (ROCEPHIN ) 2 g in sodium chloride  0.9 % 100 mL IVPB        2 g 200 mL/hr over 30 Minutes Intravenous  Once 04/09/24 1138 04/09/24 1244         Subjective: Patient seen and examined.  No new complaint other than left flank pain as mentioned above.  Objective: Vitals:   04/13/24 1831 04/13/24 2059 04/14/24 0602 04/14/24 0902  BP: (!) 146/68 (!) 140/69 (!) 157/68 134/65  Pulse: 94 88 79 80  Resp: 16 20 12 18   Temp: 99.2 F (37.3 C) 99.5 F (37.5 C) 98.4 F (36.9 C) 98.1 F (36.7 C)  TempSrc: Oral Oral Oral Oral  SpO2:  100% 98%     Intake/Output Summary (Last 24 hours) at 04/14/2024 1149 Last data filed at 04/14/2024 0902 Gross per 24 hour  Intake 840 ml  Output 1300 ml  Net -460 ml   There were no vitals filed for this visit.  Examination:  General exam: Appears calm and comfortable, white cheesy material noted on the tongue consistent with candidiasis. Respiratory system: Clear to auscultation. Respiratory effort normal. Cardiovascular system: S1 & S2 heard, RRR. No JVD, murmurs, rubs, gallops or clicks. No pedal edema. Gastrointestinal system: Abdomen is nondistended, soft and moderate to severe left CVA tenderness.  No  organomegaly or masses felt. Normal bowel sounds heard. Central nervous system: Alert and oriented. No focal neurological deficits. Extremities: Symmetric 5 x 5 power. Skin: No rashes, lesions or ulcers.  Psychiatry: Judgement and insight appear normal. Mood & affect appropriate.   Data Reviewed: I have personally reviewed following labs and imaging studies  CBC: Recent Labs  Lab 04/09/24 1205 04/10/24 0502 04/11/24 0817 04/12/24 0506 04/14/24 0917  WBC 7.5 8.8 11.2* 9.4 12.7*  NEUTROABS 4.9  --  8.0* 6.7 10.1*  HGB 10.5* 9.5* 8.8* 8.6* 8.3*  HCT 33.1* 30.0* 29.1* 27.9* 27.1*  MCV 87.1 89.3 89.3 88.3 88.0  PLT 436* 378 476* 482* 570*   Basic Metabolic Panel: Recent Labs  Lab 04/09/24 1205 04/09/24 1326 04/10/24 0502 04/11/24 0817 04/12/24 0506 04/14/24 0917  NA 137  --  134* 136 137 136  K 2.8*  --  3.6 3.2* 3.9 3.6  CL 98  --  100 101 100 101  CO2 28  --  24 27 26 23   GLUCOSE 120*  --  122* 141* 125* 126*  BUN 8  --  9 7 8 7   CREATININE 0.65  --  0.65 0.66 0.66 0.72  CALCIUM  9.0  --  8.2* 8.4* 8.3* 8.4*  MG  --  1.8  --   --   --   --    GFR: Estimated Creatinine Clearance: 80.9 mL/min (by C-G formula based on SCr of 0.72 mg/dL). Liver Function Tests: Recent Labs  Lab 04/09/24 1205  AST 30  ALT 23  ALKPHOS 158*  BILITOT 0.7  PROT 7.2  ALBUMIN 3.0*   Recent Labs  Lab 04/09/24 1205  LIPASE 19   No results for input(s): AMMONIA in the last 168 hours. Coagulation Profile: No results for input(s): INR, PROTIME in the last 168 hours. Cardiac Enzymes: No results for input(s): CKTOTAL, CKMB, CKMBINDEX, TROPONINI in the last 168 hours. BNP (last 3 results) No results for input(s): PROBNP in the last 8760 hours. HbA1C: No results for input(s): HGBA1C in the last 72 hours. CBG: No results for input(s): GLUCAP in the last 168 hours. Lipid Profile: No results for input(s): CHOL, HDL, LDLCALC, TRIG, CHOLHDL, LDLDIRECT in the  last 72 hours. Thyroid  Function Tests: No results for input(s): TSH, T4TOTAL, FREET4, T3FREE, THYROIDAB in the last 72 hours. Anemia Panel: No results for input(s): VITAMINB12, FOLATE, FERRITIN, TIBC, IRON, RETICCTPCT in the last 72 hours.  Sepsis Labs: Recent Labs  Lab 04/09/24 1155 04/09/24 1419  LATICACIDVEN 1.6 1.4    Recent Results (from the past 240 hours)  Culture, blood (routine x 2)     Status: None   Collection Time: 04/09/24 11:55 AM   Specimen: BLOOD  Result Value Ref Range Status   Specimen Description   Final    BLOOD LEFT ANTECUBITAL Performed at [facility], [address] [place], [place] [address]    Special Requests   Final    BOTTLES DRAWN AEROBIC AND ANAEROBIC Blood Culture adequate volume Performed at [facility], [address] [place], [place] [address]    Culture   Final    NO GROWTH 5 DAYS Performed at [facility], [address] [place], [place] [address]    Report Status 04/14/2024 FINAL  Final  Culture, blood (routine x 2)     Status: None   Collection Time: 04/09/24 11:56 AM   Specimen: BLOOD  Result Value Ref Range Status   Specimen Description   Final    BLOOD RIGHT ANTECUBITAL Performed at [facility], [address] [place], [place] [address]    Special Requests   Final    BOTTLES DRAWN AEROBIC AND ANAEROBIC Blood Culture adequate volume Performed at [facility], [address], [place], [place] [address]    Culture   Final    NO GROWTH 5 DAYS Performed at [facility], [address], [place], [place] [address]    Report Status 04/14/2024 FINAL  Final  Urine Culture     Status: None   Collection Time: 04/09/24  2:26 PM   Specimen: Urine, Clean Catch  Result Value Ref Range Status   Specimen Description   Final    URINE, CLEAN CATCH Performed at [facility], [address], [place], [place] [address]     Special Requests   Final    NONE Reflexed from [id number] Performed  at [facility], [address] [place], [place] [address]    Culture   Final    NO GROWTH Performed at [facility], [address] [place], [place] [address]    Report Status 04/10/2024 FINAL  Final     Radiology Studies: No results found.   Scheduled Meds:  buPROPion   300 mg Oral q morning   busPIRone   5 mg Oral BID   Chlorhexidine  Gluconate Cloth  6 each Topical Daily   dicyclomine   10 mg Oral TID AC & HS   enoxaparin  (LOVENOX ) injection  40 mg Subcutaneous Q24H   famotidine   20 mg Oral BID   FLUoxetine   40 mg Oral Daily   hydrOXYzine   25 mg Oral Q6H   levothyroxine   125 mcg Oral QAC breakfast   losartan   25 mg Oral Daily   nystatin   5 mL Oral QID   pantoprazole   80 mg Oral Daily   rosuvastatin   10 mg Oral Daily   Continuous Infusions:  cefTRIAXone  (ROCEPHIN )  IV Stopped (04/14/24 1110)     LOS: 5 days   Fredia Skeeter, MD [facility]  04/14/2024, 11:49 AM   *Please note that this is a verbal dictation therefore any spelling or grammatical errors are due to the Dragon Medical One system interpretation.  Please page via Amion and do not message via secure chat for urgent patient care matters. Secure chat can be used for non urgent patient care matters.  How to contact the [facility] Attending or Consulting provider 7A - 7P or covering provider during after hours 7P -7A, for this patient?  Check the care team in [facility] and look for a) attending/consulting [facility] provider listed and b) the [facility] team listed. Page or secure chat 7A-7P. Log into [email or web address] and use [facility] universal password to access. If you do not have the password, please contact the hospital operator. Locate the [facility] provider you are looking for under [facility] and page to a number that you can be directly reached. If you still have difficulty reaching the provider, please page the [facility] (Director on  Call) for the Hospitalists listed on amion for assistance.

## 2024-04-15 DIAGNOSIS — N10 Acute tubulo-interstitial nephritis: Secondary | ICD-10-CM | POA: Diagnosis not present

## 2024-04-15 LAB — CBC WITH DIFFERENTIAL/PLATELET
Abs Immature Granulocytes: 0.16 10*3/uL — ABNORMAL HIGH (ref 0.00–0.07)
Basophils Absolute: 0 10*3/uL (ref 0.0–0.1)
Basophils Relative: 0 %
Eosinophils Absolute: 0.1 10*3/uL (ref 0.0–0.5)
Eosinophils Relative: 1 %
HCT: 27.2 % — ABNORMAL LOW (ref 36.0–46.0)
Hemoglobin: 8.4 g/dL — ABNORMAL LOW (ref 12.0–15.0)
Immature Granulocytes: 1 %
Lymphocytes Relative: 11 %
Lymphs Abs: 1.3 10*3/uL (ref 0.7–4.0)
MCH: 27 pg (ref 26.0–34.0)
MCHC: 30.9 g/dL (ref 30.0–36.0)
MCV: 87.5 fL (ref 80.0–100.0)
Monocytes Absolute: 1.6 10*3/uL — ABNORMAL HIGH (ref 0.1–1.0)
Monocytes Relative: 13 %
Neutro Abs: 8.7 10*3/uL — ABNORMAL HIGH (ref 1.7–7.7)
Neutrophils Relative %: 74 %
Platelets: 667 10*3/uL — ABNORMAL HIGH (ref 150–400)
RBC: 3.11 MIL/uL — ABNORMAL LOW (ref 3.87–5.11)
RDW: 15.5 % (ref 11.5–15.5)
WBC: 11.9 10*3/uL — ABNORMAL HIGH (ref 4.0–10.5)
nRBC: 0 % (ref 0.0–0.2)

## 2024-04-15 MED ORDER — KETOROLAC TROMETHAMINE 15 MG/ML IJ SOLN
15.0000 mg | Freq: Four times a day (QID) | INTRAMUSCULAR | Status: DC | PRN
  Administered 2024-04-15 – 2024-04-16 (×3): 15 mg via INTRAVENOUS
  Filled 2024-04-15 (×3): qty 1

## 2024-04-15 NOTE — Plan of Care (Signed)
  Problem: Clinical Measurements: Goal: Ability to maintain clinical measurements within normal limits will improve Outcome: Progressing Goal: Will remain free from infection Outcome: Progressing Goal: Diagnostic test results will improve Outcome: Progressing   Problem: Elimination: Goal: Will not experience complications related to bowel motility Outcome: Progressing Goal: Will not experience complications related to urinary retention Outcome: Progressing

## 2024-04-15 NOTE — Progress Notes (Signed)
 PROGRESS NOTE    Melissa [name]  FMW:[id number] DOB: 03/02/1966 DOA: 04/09/2024 PCP: Sophronia Ozell JAYSON, MD   Brief Narrative:  HPI: Melissa [name] is a 58 y.o. female with medical history significant of HTN, IBS, GERD, hypothyroidism, pre-diabetes, congenital left kidney atrophy who presented to the emergency department twice in the last week for same issues.  She presented 6/11 with left-sided flank pain.  She was diagnosed with pyelonephritis and discharged on Keflex .  She presented again 6/11 to the emergency department with left-sided flank pain.  She was given Flomax  and discharged home.  She went to her primary care physician at [facility] earlier today, was instructed to seek care in the emergency department as she had no improvement on oral antibiotics.   She admits to left-sided flank pain, describes it as severe pain as though she is getting stabbed by a burning branding iron.  Also had some fevers at home.  Nausea and vomiting.  She states that she was diagnosed with congenital kidney disease on the left side when she was 58 years old.    She also admits that she has been under significant stress lately due to sudden passing of her friend's adult child.    ED Course: Labs obtained which reveal hypokalemia 2.8.  UA obtained.  CT abdomen pelvis results as below.  Urology consulted.  Patient given IV Rocephin .  Assessment & Plan:   Principal Problem:   Acute pyelonephritis Active Problems:   Obesity (BMI 30-39.9)   Acquired hypothyroidism   Atrophy of left kidney   Prediabetes   HLD (hyperlipidemia)   Depression   Hypokalemia   HTN (hypertension)   Anxiety  Acute pyelonephritis with hydronephrosis: 2 visits to ED and discharged home on oral antibiotic, continued to have pain despite of taking antibiotics so showed up to the ED again. CT A/P showed chronic severe hydronephrosis on left with chronic left UPJ obstruction.  UA consistent with UTI.  Started on IV Rocephin .  Urology  on board.  Per them, will eventually need left nephrectomy however patient has scheduled bilateral knee replacements in next couple of months so they are planning this electively as outpatient in December.  Patient aware of the plan.  He spiked a fever of 100.7 at 8 PM on the night of 04/11/2024.  Thankfully she is afebrile since then.  Leukocytosis resolved as well.  Urology following.  Foley catheter was inserted on 04/11/2024. she felt discomfort due to catheter and requested catheter removal which was removed per her request on the evening of 04/13/2024.  Patient has been voiding successfully after that.  Her pain is worse today 10 out of 10.  Urology was planning to do percutaneous nephrostomy for which I kept her n.p.o. last night however after my discussion with urology PA, patient has atrophic kidneys and percutaneous nephrostomy tube may not be a good idea for her and it may delay her upcoming knee surgeries as well and thus they are trying to control her pain with pain medications.  They will place medication orders.  Acute anemia: Her hemoglobin was totally normal last year.  Her hemoglobin was 11.4 on 04/03/2024 and 10.5 on day of admission here which has been gradually declining and down to 8.4.  Normal MCV.  Iron studies indicate combination of iron deficiency anemia and anemia of chronic disease.  Oropharyngeal candidiasis: Nystatin  swish and swallow 4 times daily.  Hypokalemia: Resolved.  Acquired hypothyroidism: Continue Synthroid .  Essential hypertension: Controlled.  Continue Cozaar .  Hyperlipidemia:  Continue Crestor .  GERD: Continue PPI  IBS: Continue Bentyl .  Mood disorder: Continue  Prediabetes: On metformin  PTA.  No need for SSI.  Morbid obesity class II: BMI 38.  Weight loss and diet modification counseled.  DVT prophylaxis: enoxaparin  (LOVENOX ) injection 40 mg Start: 04/09/24 2200   Code Status: Full Code  Family Communication:  None present at bedside.  Plan of care  discussed with patient in length and he/she verbalized understanding and agreed with it.  Status is: Inpatient Remains inpatient appropriate because: Still with pain due to hydronephrosis, urology managing.   Estimated body mass index is 38.71 kg/m as calculated from the following:   Height as of this encounter: 5' 2 (1.575 m).   Weight as of 04/03/24: 96 kg.    Nutritional Assessment: Body mass index is 38.71 kg/m.SABRA Seen by dietician.  I agree with the assessment and plan as outlined below: Nutrition Status:        . Skin Assessment: I have examined the patient's skin and I agree with the wound assessment as performed by the wound care RN as outlined below:    Consultants:  Urology  Procedures:  None  Antimicrobials:  Anti-infectives (From admission, onward)    Start     Dose/Rate Route Frequency Ordered Stop   04/10/24 1000  cefTRIAXone  (ROCEPHIN ) 2 g in sodium chloride  0.9 % 100 mL IVPB        2 g 200 mL/hr over 30 Minutes Intravenous Every 24 hours 04/09/24 1811     04/09/24 1145  cefTRIAXone  (ROCEPHIN ) 2 g in sodium chloride  0.9 % 100 mL IVPB        2 g 200 mL/hr over 30 Minutes Intravenous  Once 04/09/24 1138 04/09/24 1244         Subjective: Seen and examined, she says that her pain is worse today, 10 out of 10.  No other complaint.  Objective: Vitals:   04/14/24 1251 04/14/24 2057 04/15/24 0520 04/15/24 0924  BP: (!) 141/67 123/66 129/61   Pulse: 80 80 80   Resp: 19 15 14    Temp: 98.9 F (37.2 C) 98.3 F (36.8 C) 98.4 F (36.9 C)   TempSrc: Oral Oral Oral   SpO2: 97% 99% 95%   Height:    5' 2 (1.575 m)    Intake/Output Summary (Last 24 hours) at 04/15/2024 0948 Last data filed at 04/15/2024 0500 Gross per 24 hour  Intake 720 ml  Output 1750 ml  Net -1030 ml   There were no vitals filed for this visit.  Examination:  General exam: Appears calm and comfortable  Respiratory system: Clear to auscultation. Respiratory effort  normal. Cardiovascular system: S1 & S2 heard, RRR. No JVD, murmurs, rubs, gallops or clicks. No pedal edema. Gastrointestinal system: Abdomen is nondistended, soft and left CVA tenderness. No organomegaly or masses felt. Normal bowel sounds heard. Central nervous system: Alert and oriented. No focal neurological deficits. Extremities: Symmetric 5 x 5 power. Skin: No rashes, lesions or ulcers.  Psychiatry: Judgement and insight appear normal. Mood & affect appropriate.   Data Reviewed: I have personally reviewed following labs and imaging studies  CBC: Recent Labs  Lab 04/09/24 1205 04/10/24 0502 04/11/24 0817 04/12/24 0506 04/14/24 0917 04/15/24 0459  WBC 7.5 8.8 11.2* 9.4 12.7* 11.9*  NEUTROABS 4.9  --  8.0* 6.7 10.1* 8.7*  HGB 10.5* 9.5* 8.8* 8.6* 8.3* 8.4*  HCT 33.1* 30.0* 29.1* 27.9* 27.1* 27.2*  MCV 87.1 89.3 89.3 88.3 88.0 87.5  PLT 436*  378 476* 482* 570* 667*   Basic Metabolic Panel: Recent Labs  Lab 04/09/24 1205 04/09/24 1326 04/10/24 0502 04/11/24 0817 04/12/24 0506 04/14/24 0917  NA 137  --  134* 136 137 136  K 2.8*  --  3.6 3.2* 3.9 3.6  CL 98  --  100 101 100 101  CO2 28  --  24 27 26 23   GLUCOSE 120*  --  122* 141* 125* 126*  BUN 8  --  9 7 8 7   CREATININE 0.65  --  0.65 0.66 0.66 0.72  CALCIUM  9.0  --  8.2* 8.4* 8.3* 8.4*  MG  --  1.8  --   --   --   --    GFR: Estimated Creatinine Clearance: 80.9 mL/min (by C-G formula based on SCr of 0.72 mg/dL). Liver Function Tests: Recent Labs  Lab 04/09/24 1205  AST 30  ALT 23  ALKPHOS 158*  BILITOT 0.7  PROT 7.2  ALBUMIN 3.0*   Recent Labs  Lab 04/09/24 1205  LIPASE 19   No results for input(s): AMMONIA in the last 168 hours. Coagulation Profile: No results for input(s): INR, PROTIME in the last 168 hours. Cardiac Enzymes: No results for input(s): CKTOTAL, CKMB, CKMBINDEX, TROPONINI in the last 168 hours. BNP (last 3 results) No results for input(s): PROBNP in the last 8760  hours. HbA1C: No results for input(s): HGBA1C in the last 72 hours. CBG: No results for input(s): GLUCAP in the last 168 hours. Lipid Profile: No results for input(s): CHOL, HDL, LDLCALC, TRIG, CHOLHDL, LDLDIRECT in the last 72 hours. Thyroid  Function Tests: No results for input(s): TSH, T4TOTAL, FREET4, T3FREE, THYROIDAB in the last 72 hours. Anemia Panel: Recent Labs    04/14/24 1339  FERRITIN 509*  TIBC 245*  IRON 26*  RETICCTPCT 0.9    Sepsis Labs: Recent Labs  Lab 04/09/24 1155 04/09/24 1419  LATICACIDVEN 1.6 1.4    Recent Results (from the past 240 hours)  Culture, blood (routine x 2)     Status: None   Collection Time: 04/09/24 11:55 AM   Specimen: BLOOD  Result Value Ref Range Status   Specimen Description   Final    BLOOD LEFT ANTECUBITAL Performed at [facility], [address] [place], [place] [address]    Special Requests   Final    BOTTLES DRAWN AEROBIC AND ANAEROBIC Blood Culture adequate volume Performed at [facility], [address] [place], [place] [address]    Culture   Final    NO GROWTH 5 DAYS Performed at [facility], [address], [place], [place] [address]    Report Status 04/14/2024 FINAL  Final  Culture, blood (routine x 2)     Status: None   Collection Time: 04/09/24 11:56 AM   Specimen: BLOOD  Result Value Ref Range Status   Specimen Description   Final    BLOOD RIGHT ANTECUBITAL Performed at [facility], [address] [place], [place] [address]    Special Requests   Final    BOTTLES DRAWN AEROBIC AND ANAEROBIC Blood Culture adequate volume Performed at [facility], [address], [place], [place] [address]    Culture   Final    NO GROWTH 5 DAYS Performed at [facility], [address], [place], [place] [address]    Report Status 04/14/2024 FINAL  Final  Urine Culture     Status: None   Collection  Time:  04/09/24  2:26 PM   Specimen: Urine, Clean Catch  Result Value Ref Range Status   Specimen Description   Final    URINE, CLEAN CATCH Performed at [facility], [address] [place], [place] [address]    Special Requests   Final    NONE Reflexed from [id number] Performed at [facility], [address] [place], [place] [address]    Culture   Final    NO GROWTH Performed at [facility], [address], [place], [place] [address]    Report Status 04/10/2024 FINAL  Final     Radiology Studies: No results found.   Scheduled Meds:  buPROPion   300 mg Oral q morning   busPIRone   5 mg Oral BID   Chlorhexidine  Gluconate Cloth  6 each Topical Daily   dicyclomine   10 mg Oral TID AC & HS   enoxaparin  (LOVENOX ) injection  40 mg Subcutaneous Q24H   famotidine   20 mg Oral BID   FLUoxetine   40 mg Oral Daily   hydrOXYzine   25 mg Oral Q6H   levothyroxine   125 mcg Oral QAC breakfast   losartan   25 mg Oral Daily   nystatin   5 mL Oral QID   pantoprazole   80 mg Oral Daily   rosuvastatin   10 mg Oral Daily   Continuous Infusions:  cefTRIAXone  (ROCEPHIN )  IV 2 g (04/15/24 0845)     LOS: 6 days   Fredia Skeeter, MD [facility]  04/15/2024, 9:48 AM   *Please note that this is a verbal dictation therefore any spelling or grammatical errors are due to the Dragon Medical One system interpretation.  Please page via Amion and do not message via secure chat for urgent patient care matters. Secure chat can be used for non urgent patient care matters.  How to contact the [facility] Attending or Consulting provider 7A - 7P or covering provider during after hours 7P -7A, for this patient?  Check the care team in [facility] and look for a) attending/consulting [facility] provider listed and b) the [facility] team listed. Page or secure chat 7A-7P. Log into [email or web address] and use [facility] universal password to access. If you do not have the password, please contact the hospital  operator. Locate the [facility] provider you are looking for under [facility] and page to a number that you can be directly reached. If you still have difficulty reaching the provider, please page the [facility] (Director on Call) for the Hospitalists listed on amion for assistance.

## 2024-04-15 NOTE — Progress Notes (Addendum)
     Subjective: Pt continued to feel discomfort from foley and requested it be removed. Pain unchanged. Pt resting in no distress.   Objective: Vital signs in last 24 hours: Temp:  [98.3 F (36.8 C)-98.9 F (37.2 C)] 98.4 F (36.9 C) (06/23 0520) Pulse Rate:  [80] 80 (06/23 0520) Resp:  [14-19] 14 (06/23 0520) BP: (123-141)/(61-67) 129/61 (06/23 0520) SpO2:  [95 %-99 %] 95 % (06/23 0520)  Assessment/Plan: # Flank pain # Atrophied left kidney with vesicoureteral reflux  Will eventually need left nephrectomy.  Discussed with Dr. Cam on an outpatient basis with a goal of around December.  Patient has bilateral knee replacements pending.  Preserved renal function  I had hoped to leave her Foley catheter in place over the weekend and collect renal ultrasound today to reassess her ureteral reflux.  Unfortunately she requested that it be removed yesterday due to discomfort.  Still trying to avoid percutaneous nephrostomy tube placement, though this would be final step. We are still trying to keep her on track for her knee surgeries.  Shared decision was made to trial a few days of Toradol  to see if we can get her inflammation down well enough for discharge. Reviewed with IR, failing all else, they felt there was enough tissue left to hold a PCNT.     Intake/Output from previous day: 06/22 0701 - 06/23 0700 In: 960 [P.O.:960] Out: 2050 [Urine:2050]  Intake/Output this shift: No intake/output data recorded.  Physical Exam:  General: Alert and oriented CV: No cyanosis Lungs: equal chest rise    Lab Results: Recent Labs    04/14/24 0917 04/15/24 0459  HGB 8.3* 8.4*  HCT 27.1* 27.2*   BMET Recent Labs    04/14/24 0917 04/15/24 0459  NA 136  --   K 3.6  --   CL 101  --   CO2 23  --   GLUCOSE 126*  --   BUN 7  --   CREATININE 0.72  --   CALCIUM  8.4*  --   HGB 8.3* 8.4*  WBC 12.7* 11.9*     Studies/Results: No results found.     LOS: 6 days   Ole Bourdon, NP [facility] Pager: [phone]  04/15/2024, 9:45 AM

## 2024-04-16 DIAGNOSIS — N10 Acute tubulo-interstitial nephritis: Secondary | ICD-10-CM | POA: Diagnosis not present

## 2024-04-16 LAB — CBC WITH DIFFERENTIAL/PLATELET
Abs Immature Granulocytes: 0.1 10*3/uL — ABNORMAL HIGH (ref 0.00–0.07)
Basophils Absolute: 0 10*3/uL (ref 0.0–0.1)
Basophils Relative: 0 %
Eosinophils Absolute: 0.2 10*3/uL (ref 0.0–0.5)
Eosinophils Relative: 2 %
HCT: 27.2 % — ABNORMAL LOW (ref 36.0–46.0)
Hemoglobin: 8.3 g/dL — ABNORMAL LOW (ref 12.0–15.0)
Immature Granulocytes: 1 %
Lymphocytes Relative: 12 %
Lymphs Abs: 1.2 10*3/uL (ref 0.7–4.0)
MCH: 27.1 pg (ref 26.0–34.0)
MCHC: 30.5 g/dL (ref 30.0–36.0)
MCV: 88.9 fL (ref 80.0–100.0)
Monocytes Absolute: 1 10*3/uL (ref 0.1–1.0)
Monocytes Relative: 10 %
Neutro Abs: 7.1 10*3/uL (ref 1.7–7.7)
Neutrophils Relative %: 75 %
Platelets: 748 10*3/uL — ABNORMAL HIGH (ref 150–400)
RBC: 3.06 MIL/uL — ABNORMAL LOW (ref 3.87–5.11)
RDW: 15.8 % — ABNORMAL HIGH (ref 11.5–15.5)
WBC: 9.6 10*3/uL (ref 4.0–10.5)
nRBC: 0 % (ref 0.0–0.2)

## 2024-04-16 MED ORDER — IBUPROFEN 200 MG PO TABS: 400.0000 mg | ORAL_TABLET | Freq: Four times a day (QID) | ORAL | Status: DC | PRN

## 2024-04-16 MED ORDER — SIMETHICONE 80 MG PO CHEW
80.0000 mg | CHEWABLE_TABLET | Freq: Four times a day (QID) | ORAL | Status: DC | PRN
  Administered 2024-04-16: 80 mg via ORAL
  Filled 2024-04-16: qty 1

## 2024-04-16 MED ORDER — KETOROLAC TROMETHAMINE 15 MG/ML IJ SOLN
15.0000 mg | Freq: Four times a day (QID) | INTRAMUSCULAR | Status: DC
  Administered 2024-04-16 (×2): 15 mg via INTRAVENOUS
  Filled 2024-04-16 (×2): qty 1

## 2024-04-16 NOTE — Progress Notes (Signed)
     Subjective: No acute events overnight.  Pain down to 4/10.  Responded well to Toradol .   Objective: Vital signs in last 24 hours: Temp:  [97.7 F (36.5 C)-98.6 F (37 C)] 98.2 F (36.8 C) (06/24 0231) Pulse Rate:  [72-81] 72 (06/24 0231) Resp:  [14-20] 14 (06/24 0231) BP: (100-132)/(64-70) 122/64 (06/24 0231) SpO2:  [99 %-100 %] 100 % (06/24 0231) Weight:  [97.3 kg] 97.3 kg (06/24 9385)  Assessment/Plan: # Flank pain # Atrophied left kidney with vesicoureteral reflux  Will eventually need left nephrectomy.  Discussed with Dr. Cam on an outpatient basis with a goal of around December.  Patient has bilateral knee replacements pending.  Preserved renal function  Patient has appreciated benefit from the Toradol .  We will continue this Q6 until tonight.  I have kept her PRN narcotic analgesics available to her in case she decides she needs them overnight.  The plan that we have agreed on is to avoid Toradol  and all narcotic pain medicine overnight and to treat with typical OTC options.  She will have Tylenol  and Advil  available to her. If she feels that she can tolerate the level of pain that she has tomorrow morning, we will recommend discharge home.  If her pain is intolerable then we will order PCNT from IR.   Intake/Output from previous day: 06/23 0701 - 06/24 0700 In: 600 [P.O.:300; IV Piggyback:300] Out: 1550 [Urine:1550]  Intake/Output this shift: No intake/output data recorded.  Physical Exam:  General: Alert and oriented CV: No cyanosis Lungs: equal chest rise    Lab Results: Recent Labs    04/14/24 0917 04/15/24 0459  HGB 8.3* 8.4*  HCT 27.1* 27.2*   BMET Recent Labs    04/14/24 0917 04/15/24 0459  NA 136  --   K 3.6  --   CL 101  --   CO2 23  --   GLUCOSE 126*  --   BUN 7  --   CREATININE 0.72  --   CALCIUM  8.4*  --   HGB 8.3* 8.4*  WBC 12.7* 11.9*     Studies/Results: No results found.     LOS: 7 days   Ole Bourdon,  NP [facility] Pager: [phone]  04/16/2024, 9:51 AM

## 2024-04-16 NOTE — Plan of Care (Signed)
  Problem: Education: Goal: Knowledge of General Education information will improve Description: Including pain rating scale, medication(s)/side effects and non-pharmacologic comfort measures Outcome: Progressing   Problem: Nutrition: Goal: Adequate nutrition will be maintained Outcome: Progressing   Problem: Coping: Goal: Level of anxiety will decrease Outcome: Progressing   Problem: Pain Managment: Goal: General experience of comfort will improve and/or be controlled Outcome: Progressing   Problem: Elimination: Goal: Will not experience complications related to bowel motility Outcome: Progressing Goal: Will not experience complications related to urinary retention Outcome: Progressing

## 2024-04-16 NOTE — Progress Notes (Signed)
 PROGRESS NOTE    Melissa [name]  FMW:[id number] DOB: 1966-09-22 DOA: 04/09/2024 PCP: Sophronia Ozell JAYSON, MD   Brief Narrative:  HPI: Melissa [name] is a 58 y.o. female with medical history significant of HTN, IBS, GERD, hypothyroidism, pre-diabetes, congenital left kidney atrophy who presented to the emergency department twice in the last week for same issues.  She presented 6/11 with left-sided flank pain.  She was diagnosed with pyelonephritis and discharged on Keflex .  She presented again 6/11 to the emergency department with left-sided flank pain.  She was given Flomax  and discharged home.  She went to her primary care physician at [facility] earlier today, was instructed to seek care in the emergency department as she had no improvement on oral antibiotics.   She admits to left-sided flank pain, describes it as severe pain as though she is getting stabbed by a burning branding iron.  Also had some fevers at home.  Nausea and vomiting.  She states that she was diagnosed with congenital kidney disease on the left side when she was 58 years old.    She also admits that she has been under significant stress lately due to sudden passing of her friend's adult child.    ED Course: Labs obtained which reveal hypokalemia 2.8.  UA obtained.  CT abdomen pelvis results as below.  Urology consulted.  Patient given IV Rocephin .  Assessment & Plan:   Principal Problem:   Acute pyelonephritis Active Problems:   Obesity (BMI 30-39.9)   Acquired hypothyroidism   Atrophy of left kidney   Prediabetes   HLD (hyperlipidemia)   Depression   Hypokalemia   HTN (hypertension)   Anxiety  Acute pyelonephritis with hydronephrosis: 2 visits to ED and discharged home on oral antibiotic, continued to have pain despite of taking antibiotics so showed up to the ED again. CT A/P showed chronic severe hydronephrosis on left with chronic left UPJ obstruction.  UA consistent with UTI.  Started on IV Rocephin .  Urology  on board.  Per them, will eventually need left nephrectomy however patient has scheduled bilateral knee replacements in next couple of months so they are planning this electively as outpatient in December.  Patient aware of the plan.  He spiked a fever of 100.7 at 8 PM on the night of 04/11/2024.  Thankfully she is afebrile since then.  Leukocytosis resolved as well.  Urology following.  Foley catheter was inserted on 04/11/2024. she felt discomfort due to catheter and requested catheter removal which was removed per her request on the evening of 04/13/2024.  Patient has been voiding successfully after that.  Patient's pain was worse yesterday 10 out of 10.  Urology is trying to avoid PCN.  They started her on scheduled Toradol  every 6 hours.  Her pain has improved and down to 3 out of 10 this morning.  They are now planning to continue scheduled Toradol  during the daytime but keep it as needed overnight and encouraged patient to use either Tylenol  or ibuprofen  and plan is to reassess tomorrow morning, if pain remains under control then discharged home, if not then proceed with PCN.  Acute anemia: Her hemoglobin was totally normal last year.  Her hemoglobin was 11.4 on 04/03/2024 and 10.5 on day of admission here which has been gradually declining and down to 8.4.  Normal MCV.  Iron studies indicate combination of iron deficiency anemia and anemia of chronic disease.  Oropharyngeal candidiasis: Nystatin  swish and swallow 4 times daily.  Hypokalemia: Resolved.  Acquired  hypothyroidism: Continue Synthroid .  Essential hypertension: Controlled.  Continue Cozaar .  Hyperlipidemia: Continue Crestor .  GERD: Continue PPI  IBS: Continue Bentyl .  Mood disorder: Continue  Prediabetes: On metformin  PTA.  No need for SSI.  Morbid obesity class II: BMI 38.  Weight loss and diet modification counseled.  DVT prophylaxis: enoxaparin  (LOVENOX ) injection 40 mg Start: 04/09/24 2200   Code Status: Full Code  Family  Communication:  None present at bedside.  Plan of care discussed with patient in length and he/she verbalized understanding and agreed with it.  Status is: Inpatient Remains inpatient appropriate because: Still with pain due to hydronephrosis, urology managing.   Estimated body mass index is 39.23 kg/m as calculated from the following:   Height as of this encounter: 5' 2 (1.575 m).   Weight as of this encounter: 97.3 kg.    Nutritional Assessment: Body mass index is 39.23 kg/m.SABRA Seen by dietician.  I agree with the assessment and plan as outlined below: Nutrition Status:        . Skin Assessment: I have examined the patient's skin and I agree with the wound assessment as performed by the wound care RN as outlined below:    Consultants:  Urology  Procedures:  None  Antimicrobials:  Anti-infectives (From admission, onward)    Start     Dose/Rate Route Frequency Ordered Stop   04/10/24 1000  cefTRIAXone  (ROCEPHIN ) 2 g in sodium chloride  0.9 % 100 mL IVPB        2 g 200 mL/hr over 30 Minutes Intravenous Every 24 hours 04/09/24 1811     04/09/24 1145  cefTRIAXone  (ROCEPHIN ) 2 g in sodium chloride  0.9 % 100 mL IVPB        2 g 200 mL/hr over 30 Minutes Intravenous  Once 04/09/24 1138 04/09/24 1244         Subjective: Seen and examined, she is happy that her pain is down to 3 out of 10 today.  She understands the plan that she discussed with urology herself.  Objective: Vitals:   04/15/24 1328 04/15/24 2031 04/16/24 0231 04/16/24 0614  BP: 100/64 132/70 122/64   Pulse: 80 81 72   Resp: 20 16 14    Temp: 97.7 F (36.5 C) 98.6 F (37 C) 98.2 F (36.8 C)   TempSrc: Oral Oral Oral   SpO2: 99% 99% 100%   Weight:    97.3 kg  Height:        Intake/Output Summary (Last 24 hours) at 04/16/2024 1000 Last data filed at 04/16/2024 0845 Gross per 24 hour  Intake 840 ml  Output 1550 ml  Net -710 ml   Filed Weights   04/16/24 0614  Weight: 97.3 kg     Examination:  General exam: Appears calm and comfortable  Respiratory system: Clear to auscultation. Respiratory effort normal. Cardiovascular system: S1 & S2 heard, RRR. No JVD, murmurs, rubs, gallops or clicks. No pedal edema. Gastrointestinal system: Abdomen is nondistended, soft and mild left CVA tenderness, much improved.. No organomegaly or masses felt. Normal bowel sounds heard. Central nervous system: Alert and oriented. No focal neurological deficits. Extremities: Symmetric 5 x 5 power. Skin: No rashes, lesions or ulcers.  Psychiatry: Judgement and insight appear normal. Mood & affect appropriate.    Data Reviewed: I have personally reviewed following labs and imaging studies  CBC: Recent Labs  Lab 04/09/24 1205 04/10/24 0502 04/11/24 0817 04/12/24 0506 04/14/24 0917 04/15/24 0459  WBC 7.5 8.8 11.2* 9.4 12.7* 11.9*  NEUTROABS 4.9  --  8.0* 6.7 10.1* 8.7*  HGB 10.5* 9.5* 8.8* 8.6* 8.3* 8.4*  HCT 33.1* 30.0* 29.1* 27.9* 27.1* 27.2*  MCV 87.1 89.3 89.3 88.3 88.0 87.5  PLT 436* 378 476* 482* 570* 667*   Basic Metabolic Panel: Recent Labs  Lab 04/09/24 1205 04/09/24 1326 04/10/24 0502 04/11/24 0817 04/12/24 0506 04/14/24 0917  NA 137  --  134* 136 137 136  K 2.8*  --  3.6 3.2* 3.9 3.6  CL 98  --  100 101 100 101  CO2 28  --  24 27 26 23   GLUCOSE 120*  --  122* 141* 125* 126*  BUN 8  --  9 7 8 7   CREATININE 0.65  --  0.65 0.66 0.66 0.72  CALCIUM  9.0  --  8.2* 8.4* 8.3* 8.4*  MG  --  1.8  --   --   --   --    GFR: Estimated Creatinine Clearance: 81.5 mL/min (by C-G formula based on SCr of 0.72 mg/dL). Liver Function Tests: Recent Labs  Lab 04/09/24 1205  AST 30  ALT 23  ALKPHOS 158*  BILITOT 0.7  PROT 7.2  ALBUMIN 3.0*   Recent Labs  Lab 04/09/24 1205  LIPASE 19   No results for input(s): AMMONIA in the last 168 hours. Coagulation Profile: No results for input(s): INR, PROTIME in the last 168 hours. Cardiac Enzymes: No results for  input(s): CKTOTAL, CKMB, CKMBINDEX, TROPONINI in the last 168 hours. BNP (last 3 results) No results for input(s): PROBNP in the last 8760 hours. HbA1C: No results for input(s): HGBA1C in the last 72 hours. CBG: No results for input(s): GLUCAP in the last 168 hours. Lipid Profile: No results for input(s): CHOL, HDL, LDLCALC, TRIG, CHOLHDL, LDLDIRECT in the last 72 hours. Thyroid  Function Tests: No results for input(s): TSH, T4TOTAL, FREET4, T3FREE, THYROIDAB in the last 72 hours. Anemia Panel: Recent Labs    04/14/24 1339  FERRITIN 509*  TIBC 245*  IRON 26*  RETICCTPCT 0.9    Sepsis Labs: Recent Labs  Lab 04/09/24 1155 04/09/24 1419  LATICACIDVEN 1.6 1.4    Recent Results (from the past 240 hours)  Culture, blood (routine x 2)     Status: None   Collection Time: 04/09/24 11:55 AM   Specimen: BLOOD  Result Value Ref Range Status   Specimen Description   Final    BLOOD LEFT ANTECUBITAL Performed at [facility], [address], [place], [place] [address]    Special Requests   Final    BOTTLES DRAWN AEROBIC AND ANAEROBIC Blood Culture adequate volume Performed at [facility], [address] [place], [place] [address]    Culture   Final    NO GROWTH 5 DAYS Performed at [facility], [address], [place], [place] [address]    Report Status 04/14/2024 FINAL  Final  Culture, blood (routine x 2)     Status: None   Collection Time: 04/09/24 11:56 AM   Specimen: BLOOD  Result Value Ref Range Status   Specimen Description   Final    BLOOD RIGHT ANTECUBITAL Performed at [facility], [address], [place], [place] [address]    Special Requests   Final    BOTTLES DRAWN AEROBIC AND ANAEROBIC Blood Culture adequate volume Performed at [facility], [address] [place], [place] [address]    Culture   Final    NO GROWTH 5 DAYS Performed at [facility], [address], [place], [place] [address]    Report Status 04/14/2024 FINAL  Final  Urine Culture     Status: None   Collection Time: 04/09/24  2:26 PM   Specimen: Urine, Clean Catch  Result Value Ref Range Status   Specimen Description   Final    URINE, CLEAN CATCH Performed at [facility], [address], [place], [place] [address]    Special Requests   Final    NONE Reflexed from [id number] Performed at [facility], [address], [place], [place] [address]    Culture   Final    NO GROWTH Performed at [facility], [address], [place], [place] [address]    Report Status 04/10/2024 FINAL  Final     Radiology Studies: No results found.   Scheduled Meds:  buPROPion   300 mg Oral q morning   busPIRone   5 mg Oral BID   Chlorhexidine  Gluconate Cloth  6 each Topical Daily   dicyclomine   10 mg Oral TID AC & HS   enoxaparin  (LOVENOX ) injection  40 mg Subcutaneous Q24H   famotidine   20 mg Oral BID   FLUoxetine   40 mg Oral Daily   hydrOXYzine   25 mg Oral Q6H   ketorolac   15 mg Intravenous Q6H   levothyroxine   125 mcg Oral QAC breakfast   losartan   25 mg Oral Daily   nystatin   5 mL Oral QID   pantoprazole   80 mg Oral Daily   rosuvastatin   10 mg Oral Daily   Continuous Infusions:  cefTRIAXone  (ROCEPHIN )  IV 2 g (04/16/24 0913)     LOS: 7 days   Fredia Skeeter, MD [facility]  04/16/2024, 10:00 AM   *Please note that this is a verbal dictation therefore any spelling or grammatical errors are due to the Dragon Medical One system interpretation.  Please page via Amion and do not message via secure chat for urgent patient care matters. Secure chat can be used for non urgent patient care matters.  How to contact the [facility] Attending or Consulting provider 7A - 7P or covering provider during after hours 7P -7A, for this patient?  Check the care team in [facility] and look for a) attending/consulting [facility] provider listed and b) the  [facility] team listed. Page or secure chat 7A-7P. Log into [email or web address] and use [place]'s universal password to access. If you do not have the password, please contact the hospital operator. Locate the [facility] provider you are looking for under [facility] and page to a number that you can be directly reached. If you still have difficulty reaching the provider, please page the [facility] (Director on Call) for the Hospitalists listed on amion for assistance.

## 2024-04-17 DIAGNOSIS — N10 Acute tubulo-interstitial nephritis: Secondary | ICD-10-CM | POA: Diagnosis not present

## 2024-04-17 LAB — BASIC METABOLIC PANEL WITH GFR
Anion gap: 12 (ref 5–15)
BUN: 9 mg/dL (ref 6–20)
CO2: 24 mmol/L (ref 22–32)
Calcium: 8.7 mg/dL — ABNORMAL LOW (ref 8.9–10.3)
Chloride: 106 mmol/L (ref 98–111)
Creatinine, Ser: 0.83 mg/dL (ref 0.44–1.00)
GFR, Estimated: >60 mL/min (ref >60)
Glucose, Bld: 119 mg/dL — ABNORMAL HIGH (ref 70–99)
Potassium: 3.9 mmol/L (ref 3.5–5.1)
Sodium: 142 mmol/L (ref 135–145)

## 2024-04-17 MED ORDER — IBUPROFEN 600 MG PO TABS: 600.0000 mg | ORAL_TABLET | Freq: Four times a day (QID) | ORAL | 0 refills | Status: DC | PRN

## 2024-04-17 MED ORDER — CEPHALEXIN 250 MG PO CAPS: 250.0000 mg | ORAL_CAPSULE | Freq: Every day | ORAL | 1 refills | Status: DC

## 2024-04-17 MED ORDER — LEVOFLOXACIN 750 MG PO TABS: 750.0000 mg | ORAL_TABLET | Freq: Every day | ORAL | 0 refills | Status: AC

## 2024-04-17 MED ORDER — ACETAMINOPHEN 500 MG PO TABS: 1000.0000 mg | ORAL_TABLET | Freq: Four times a day (QID) | ORAL | Status: AC | PRN

## 2024-04-17 NOTE — Discharge Summary (Signed)
 Physician Discharge Summary   Patient: Melissa [name] MRN: [id number] DOB: 04-16-1966  Admit date:     04/09/2024  Discharge date: {dischdate:26783}  Discharge Physician: Bernardino KATHEE Come   PCP: Sophronia Ozell JAYSON, MD   Recommendations at discharge:  {Tip this will not be part of the note when signed- Example include specific recommendations for outpatient follow-up, pending tests to follow-up on. (Optional):26781}  ***  Discharge Diagnoses: Principal Problem:   Acute pyelonephritis Active Problems:   Obesity (BMI 30-39.9)   Acquired hypothyroidism   Atrophy of left kidney   Prediabetes   HLD (hyperlipidemia)   Depression   Hypokalemia   HTN (hypertension)   Anxiety  Resolved Problems:   * No resolved hospital problems. [facility] Course: No notes on file  Assessment and Plan: No notes have been filed under this hospital service. Service: Hospitalist     {Tip this will not be part of the note when signed Body mass index is 39.23 kg/m. , ,  (Optional):26781}  {(NOTE) Pain control PDMP Statment (Optional):26782} Consultants: *** Procedures performed: ***  Disposition: {Plan; Disposition:26390} Diet recommendation:  {Diet_Plan:26776} DISCHARGE MEDICATION: Allergies as of 04/17/2024       Reactions   Bee Venom Hives   Lactose Intolerance (gi) Other (See Comments)        Medication List     STOP taking these medications    ciprofloxacin 500 MG tablet Commonly known as: CIPRO   metroNIDAZOLE 500 MG tablet Commonly known as: FLAGYL       TAKE these medications    acetaminophen  500 MG tablet Commonly known as: TYLENOL  Take 2 tablets (1,000 mg total) by mouth every 6 (six) hours as needed (pain).   aspirin  81 MG chewable tablet Chew 81 mg by mouth.   Breyna 80-4.5 MCG/ACT inhaler Generic drug: budesonide-formoterol  Inhale 2 puffs into the lungs daily as needed.   buPROPion  300 MG 24 hr tablet Commonly known as: WELLBUTRIN  XL Take 1 tablet by  mouth every morning.   busPIRone  5 MG tablet Commonly known as: BUSPAR  Take 5 mg by mouth 2 (two) times daily as needed for anxiety.   cephALEXin  250 MG capsule Commonly known as: KEFLEX  Take 1 capsule (250 mg total) by mouth at bedtime. What changed:  medication strength how much to take when to take this   chlorpheniramine 4 MG tablet Commonly known as: CHLOR-TRIMETON Take 4 mg by mouth daily.   cholecalciferol 25 MCG (1000 UNIT) tablet Commonly known as: VITAMIN D3 Take 1,000 Units by mouth daily.   [employer] E[employer] ALLERGY RELIEF OP Place 1 drop into both eyes daily as needed (allergies).   dicyclomine  10 MG capsule Commonly known as: BENTYL  Take 10 mg by mouth in the morning, at noon, in the evening, and at bedtime.   diphenhydrAMINE  25 mg capsule Commonly known as: BENADRYL  Take 1 capsule (25 mg total) by mouth every 6 (six) hours as needed for itching, allergies or sleep.   EPINEPHrine  0.3 mg/0.3 mL Soaj injection Commonly known as: EPI-PEN Inject 0.3 mg into the muscle as needed for anaphylaxis.   famotidine  20 MG tablet Commonly known as: PEPCID  Take 1 tablet by mouth 2 (two) times daily.   FLUoxetine  40 MG capsule Commonly known as: PROZAC  Take 40 mg by mouth daily.   hydrOXYzine  25 MG tablet Commonly known as: ATARAX  Take 1 tablet by mouth every 6 (six) hours.   ibuprofen  600 MG tablet Commonly known as: ADVIL  Take 1 tablet (600 mg total) by  mouth every 6 (six) hours as needed (pain).   levofloxacin 750 MG tablet Commonly known as: Levaquin Take 1 tablet (750 mg total) by mouth daily for 5 days.   levothyroxine  125 MCG tablet Commonly known as: SYNTHROID  Take 125 mcg by mouth daily before breakfast.   losartan  25 MG tablet Commonly known as: COZAAR  Take 1 tablet by mouth daily.   meclizine  25 MG tablet Commonly known as: ANTIVERT  Take 25 mg by mouth 3 (three) times daily as needed for dizziness.   metFORMIN  500 MG tablet Commonly known as:  GLUCOPHAGE  1 tab at lunch 1 tab at dinner   metoCLOPramide  10 MG tablet Commonly known as: REGLAN  Take 10 mg by mouth.   montelukast  10 MG tablet Commonly known as: SINGULAIR  Take 10 mg by mouth at bedtime.   mupirocin ointment 2 % Commonly known as: BACTROBAN Apply 1 Application topically daily as needed (irritation).   omeprazole 40 MG capsule Commonly known as: PRILOSEC Take 40 mg by mouth at bedtime.   rosuvastatin  10 MG tablet Commonly known as: CRESTOR  Take 10 mg by mouth.   tamsulosin  0.4 MG Caps capsule Commonly known as: Flomax  Take 1 capsule (0.4 mg total) by mouth daily.   triamcinolone  cream 0.5 % Commonly known as: KENALOG  Apply 1 Application topically daily as needed (irritation).   UNABLE TO FIND Inhale 2 puffs into the lungs 2 (two) times daily.   vitamin C 1000 MG tablet Take 3,000 mg by mouth at bedtime.   Zepbound  7.5 MG/0.5ML Pen Generic drug: tirzepatide  Inject 7.5 mg into the skin once a week.        Follow-up Information     Cam Morene ORN, MD. Schedule an appointment as soon as possible for a visit on 08/07/2024.   Specialty: Urology Contact information: [address] [place] [address] [phone]         Sophronia Ozell BROCKS, MD Follow up.   Specialty: Family Medicine Contact information: [address] [place] [address] [phone]                Discharge Exam: [name]   04/16/24 9385  Weight: 97.3 kg   ***  Condition at discharge: {DC Condition:26389}  The results of significant diagnostics from this hospitalization (including imaging, microbiology, ancillary and laboratory) are listed below for reference.   Imaging Studies: CT ABDOMEN PELVIS W CONTRAST Result Date: 04/09/2024 CLINICAL DATA:  Left-sided abdominal pain for 1 week. Pyelonephritis suspected. EXAM: CT ABDOMEN AND PELVIS WITH CONTRAST TECHNIQUE: Multidetector CT imaging of the abdomen and pelvis was performed  using the standard protocol following bolus administration of intravenous contrast. RADIATION DOSE REDUCTION: This exam was performed according to the departmental dose-optimization program which includes automated exposure control, adjustment of the mA and/or kV according to patient size and/or use of iterative reconstruction technique. CONTRAST:  OMNIPAQUE  IOHEXOL  300 MG/ML  SOLN COMPARISON:  CT of the abdomen and pelvis without contrast 04/03/2024. No other comparison studies. FINDINGS: Lower chest: Mild atelectasis at both lung bases. No confluent airspace disease or significant pleural effusion. Hepatobiliary: The liver is normal in density without suspicious focal abnormality. No evidence of gallstones, gallbladder wall thickening or biliary dilatation. Pancreas: Unremarkable. No pancreatic ductal dilatation or surrounding inflammatory changes. Spleen: Normal in size without focal abnormality. Adrenals/Urinary Tract: Both adrenal glands appear normal. Again demonstrated are findings consistent with chronic severe hydronephrosis on the left with diffuse cortical thinning and no demonstrated contrast excretion. Surrounding soft tissue stranding  appears mildly improved from the recent prior study. The left ureter does not appear dilated, and these findings suggest chronic left UPJ obstruction. The right kidney appears normal, without focal mass lesion, urinary tract calculus, hydronephrosis or delayed contrast excretion. The bladder appears unremarkable for its degree of distention. Stomach/Bowel: No enteric contrast administered. The stomach appears unremarkable for its degree of distension. No evidence of bowel wall thickening, distention or surrounding inflammatory change. The appendix appears normal. Prominent stool throughout the colon. Mild distal colonic diverticular changes without acute inflammation. Vascular/Lymphatic: There are no enlarged abdominal or pelvic lymph nodes. Aortic and branch vessel  atherosclerosis without evidence of aneurysm or large vessel occlusion. Reproductive: The uterus and ovaries appear unremarkable. No adnexal mass. Other: No evidence of abdominal wall mass or hernia. No ascites or pneumoperitoneum. Musculoskeletal: No acute or significant osseous findings. Status post L4-5 OLIF with chronic sacroiliac degenerative changes, greater on the left. IMPRESSION: 1. Findings consistent with chronic severe hydronephrosis on the left with diffuse cortical thinning and no demonstrated contrast excretion. Surrounding soft tissue stranding appears mildly improved from the recent prior study. Findings suggest chronic left UPJ obstruction. See recent prior study which references prior outside imaging, and consider urology follow-up. 2. The right kidney appears normal. 3. No other acute findings. 4.  Aortic Atherosclerosis (ICD10-I70.0). Electronically Signed   By: Elsie Perone M.D.   On: 04/09/2024 15:07   CT Renal Stone Study Result Date: 04/03/2024 CLINICAL DATA:  Left flank pain radiating into the groin. Fall today. Difficulty urinating today. EXAM: CT ABDOMEN AND PELVIS WITHOUT CONTRAST TECHNIQUE: Multidetector CT imaging of the abdomen and pelvis was performed following the standard protocol without IV contrast. RADIATION DOSE REDUCTION: This exam was performed according to the departmental dose-optimization program which includes automated exposure control, adjustment of the mA and/or kV according to patient size and/or use of iterative reconstruction technique. COMPARISON:  None Available. FINDINGS: Lower chest: Unremarkable Hepatobiliary: Unremarkable Pancreas: Unremarkable Spleen: Trace fluid along the inferomedial margin of the spleen is attributable to the right renal/perirenal process. Adrenals/Urinary Tract: Confluent cystic lesions in the expected location of the left kidney suspicious for severely dilated left renal collecting system and prominent/marked thinning of the left  renal parenchyma, with surrounding edema and stranding in the perirenal space favoring active inflammation. No dilated left ureter or stone, appearance suggests severe UPJ narrowing. No gas in the collecting system or perirenal space. The right kidney appears unremarkable. Perirenal stranding partially extends next to the left adrenal gland. Urinary bladder unremarkable. Stomach/Bowel: Scattered diverticula of the descending colon without findings of active diverticulitis. Normal appendix. Vascular/Lymphatic: Atherosclerosis is present, including aortoiliac atherosclerotic disease. There is some atheromatous plaque dorsally at the origins of the celiac trunk and SMA. Reproductive: Unremarkable Other: No supplemental non-categorized findings. Musculoskeletal: PLIF at L4-5. Degenerative facet arthropathy and degenerative disc disease at L3-4. Mild sclerosis along the left sacroiliac joint, likely degenerative. IMPRESSION: 1. Severe left renal atrophy with markedly dilated left renal collecting system but also left perirenal stranding suggesting active inflammation. Based on investigation of outside reports from [facility], findings of end-stage left renal atrophy and left hydronephrosis have been reported previously such as on 09/09/2025 CT abdomen, and a nuclear medicine renogram performed on 05/22/2018 showed 99% of relative renal mass on the right side. Accordingly, some of these findings are likely chronic but the left perirenal stranding is more likely to be acute and suggests active inflammation or possibly infection. 2. Scattered diverticula of the descending colon without findings of  active diverticulitis. 3. PLIF at L4-5. Degenerative facet arthropathy and degenerative disc disease at L3-4. 4. Mild sclerosis along the left sacroiliac joint, likely degenerative. 5.  Aortic Atherosclerosis (ICD10-I70.0). Electronically Signed   By: Tran Arzuaga Salvage M.D.   On: 04/03/2024 17:06    Microbiology: Results  for orders placed or performed during the hospital encounter of 04/09/24  Culture, blood (routine x 2)     Status: None   Collection Time: 04/09/24 11:55 AM   Specimen: BLOOD  Result Value Ref Range Status   Specimen Description   Final    BLOOD LEFT ANTECUBITAL Performed at [facility], [address] [place], [place] [address]    Special Requests   Final    BOTTLES DRAWN AEROBIC AND ANAEROBIC Blood Culture adequate volume Performed at [facility], [address], [place], [place] [address]    Culture   Final    NO GROWTH 5 DAYS Performed at [facility], [address] [place], [place] [address]    Report Status 04/14/2024 FINAL  Final  Culture, blood (routine x 2)     Status: None   Collection Time: 04/09/24 11:56 AM   Specimen: BLOOD  Result Value Ref Range Status   Specimen Description   Final    BLOOD RIGHT ANTECUBITAL Performed at [facility], [address] [place], [place] [address]    Special Requests   Final    BOTTLES DRAWN AEROBIC AND ANAEROBIC Blood Culture adequate volume Performed at [facility], [address] [place], [place] [address]    Culture   Final    NO GROWTH 5 DAYS Performed at [facility], [address] [place], [place] [address]    Report Status 04/14/2024 FINAL  Final  Urine Culture     Status: None   Collection Time: 04/09/24  2:26 PM   Specimen: Urine, Clean Catch  Result Value Ref Range Status   Specimen Description   Final    URINE, CLEAN CATCH Performed at [facility], [address], [place], [place] [address]    Special Requests   Final    NONE Reflexed from [id number] Performed at [facility], [address], [place], [place] [address]    Culture   Final    NO GROWTH Performed at [facility], [address], [place], [place] [address]    Report Status 04/10/2024 FINAL  Final    Labs: CBC: Recent  Labs  Lab 04/11/24 0817 04/12/24 0506 04/14/24 0917 04/15/24 0459 04/16/24 0905  WBC 11.2* 9.4 12.7* 11.9* 9.6  NEUTROABS 8.0* 6.7 10.1* 8.7* 7.1  HGB 8.8* 8.6* 8.3* 8.4* 8.3*  HCT 29.1* 27.9* 27.1* 27.2* 27.2*  MCV 89.3 88.3 88.0 87.5 88.9  PLT 476* 482* 570* 667* 748*   Basic Metabolic Panel: Recent Labs  Lab 04/11/24 0817 04/12/24 0506 04/14/24 0917 04/17/24 0519  NA 136 137 136 142  K 3.2* 3.9 3.6 3.9  CL 101 100 101 106  CO2 27 26 23 24   GLUCOSE 141* 125* 126* 119*  BUN 7 8 7 9   CREATININE 0.66 0.66 0.72 0.83  CALCIUM  8.4* 8.3* 8.4* 8.7*   Liver Function Tests: No results for input(s): AST, ALT, ALKPHOS, BILITOT, PROT, ALBUMIN in the last 168 hours. CBG: No results for input(s): GLUCAP in the last 168 hours.  Discharge time spent: {LESS THAN/GREATER UYJW:73611} 30 minutes.  Signed: Bernardino KATHEE Come, MD [facility] 04/17/2024

## 2024-04-17 NOTE — Progress Notes (Signed)
     Subjective: Pain is well-controlled this morning and patient is excited to have breakfast and return home.  No acute events overnight.  No narcotics required.  Objective: Vital signs in last 24 hours: Temp:  [98.2 F (36.8 C)-98.6 F (37 C)] 98.6 F (37 C) (06/25 0233) Pulse Rate:  [78-79] 78 (06/25 0233) Resp:  [16-21] 16 (06/25 0233) BP: (122-144)/(63-69) 134/67 (06/25 0233) SpO2:  [98 %-100 %] 98 % (06/25 0233)  Assessment/Plan: # Flank pain # Atrophied left kidney with vesicoureteral reflux # Pyelonephritis  14 days total ABX treatment  Will eventually need left nephrectomy.  Discussed with Dr. Cam on an outpatient basis with a goal of around December.  Patient has bilateral knee replacements pending.  Preserved renal function  Our attempt to reduce her inflammation with the Toradol  has been successful.  Patient has managed overnight with OTC medications and feels that her pain level is acceptable and confident that she can discharge home.  She will follow-up closely with us  in the office. Should this return we will proceed to percutaneous nephrostomy tube, otherwise we will look towards scheduling her nephrectomy sometime this winter after she has completed her bilateral knee replacements.   Intake/Output from previous day: 06/24 0701 - 06/25 0700 In: 480 [P.O.:480] Out: -   Intake/Output this shift: No intake/output data recorded.  Physical Exam:  General: Alert and oriented CV: No cyanosis Lungs: equal chest rise    Lab Results: Recent Labs    04/14/24 0917 04/15/24 0459 04/16/24 0905  HGB 8.3* 8.4* 8.3*  HCT 27.1* 27.2* 27.2*   BMET Recent Labs    04/14/24 0917 04/15/24 0459 04/16/24 0905 04/17/24 0519  NA 136  --   --  142  K 3.6  --   --  3.9  CL 101  --   --  106  CO2 23  --   --  24  GLUCOSE 126*  --   --  119*  BUN 7  --   --  9  CREATININE 0.72  --   --  0.83  CALCIUM  8.4*  --   --  8.7*  HGB 8.3* 8.4* 8.3*  --   WBC 12.7*  11.9* 9.6  --      Studies/Results: No results found.     LOS: 8 days   Ole Bourdon, NP [facility] Pager: [phone]  04/17/2024, 8:12 AM

## 2024-04-17 NOTE — Consult Note (Signed)
 Plan to start cephalexin  250mg  at bedtime (sent to Rx) to get her through her knee surgeries.  I'll see her after that in mid-October and plan a nephrectomy at that point.

## 2024-05-07 ENCOUNTER — Ambulatory Visit (INDEPENDENT_AMBULATORY_CARE_PROVIDER_SITE_OTHER): Admitting: Adult Health

## 2024-05-07 ENCOUNTER — Encounter (INDEPENDENT_AMBULATORY_CARE_PROVIDER_SITE_OTHER): Payer: Self-pay | Admitting: Adult Health

## 2024-05-07 ENCOUNTER — Telehealth (INDEPENDENT_AMBULATORY_CARE_PROVIDER_SITE_OTHER): Payer: Self-pay | Admitting: Adult Health

## 2024-05-07 VITALS — BP 97/62 | HR 94 | Temp 98.4°F | Ht 62.0 in | Wt 201.0 lb

## 2024-05-07 DIAGNOSIS — G8929 Other chronic pain: Secondary | ICD-10-CM

## 2024-05-07 DIAGNOSIS — R7303 Prediabetes: Secondary | ICD-10-CM | POA: Diagnosis not present

## 2024-05-07 DIAGNOSIS — Z6836 Body mass index (BMI) 36.0-36.9, adult: Secondary | ICD-10-CM

## 2024-05-07 DIAGNOSIS — M25562 Pain in left knee: Secondary | ICD-10-CM

## 2024-05-07 DIAGNOSIS — M25561 Pain in right knee: Secondary | ICD-10-CM | POA: Diagnosis not present

## 2024-05-07 DIAGNOSIS — Z6841 Body Mass Index (BMI) 40.0 and over, adult: Secondary | ICD-10-CM

## 2024-05-07 DIAGNOSIS — G4733 Obstructive sleep apnea (adult) (pediatric): Secondary | ICD-10-CM

## 2024-05-07 DIAGNOSIS — E669 Obesity, unspecified: Secondary | ICD-10-CM

## 2024-05-07 MED ORDER — TIRZEPATIDE-WEIGHT MANAGEMENT 5 MG/0.5ML ~~LOC~~ SOLN: 5.0000 mg | SUBCUTANEOUS | 0 refills | Status: DC

## 2024-05-07 MED ORDER — ZEPBOUND 7.5 MG/0.5ML ~~LOC~~ SOAJ: 7.5000 mg | SUBCUTANEOUS | 0 refills | Status: DC

## 2024-05-07 NOTE — Telephone Encounter (Signed)
 Heather with the pharmacy said that a prescription for a  tirzepatide  vial was sent in but that is a compounded medication and they cannot fill that. Powell stated that they can fill Zepbound  or Mounjaro . Please follow up with Powell with the [facility] pharmacy at  [phone].

## 2024-05-07 NOTE — Progress Notes (Signed)
 WEIGHT SUMMARY AND BIOMETRICS  Vitals Temp: 98.4 F (36.9 C) BP: 97/62 Pulse Rate: 94 SpO2: 100 %   Anthropometric Measurements Height: 5' 2 (1.575 m) Weight: 201 lb (91.2 kg) BMI (Calculated): 36.75 Weight at Last Visit: 213 lb Weight Lost Since Last Visit: 12 lb Weight Gained Since Last Visit: 0 Starting Weight: 256 lb Total Weight Loss (lbs): 55 lb (24.9 kg)   Body Composition  Body Fat %: 48.8 % Fat Mass (lbs): 98.2 lbs Muscle Mass (lbs): 98 lbs Total Body Water  (lbs): 75 lbs Visceral Fat Rating : 14   Other Clinical Data Labs: no Today's Visit #: no Starting Date: 11/18/19    Chief Complaint:   OBESITY Melissa [name] is here to discuss her progress with her obesity treatment plan.  She is on the the Category 3 Plan and states she is following her eating plan approximately 80 % of the time.  She states she is exercising Walking 15 min Yard Work 60 min daily/3-4 times weekly  Interim History:   She was recently started on weekly Started Zepbound  2.5mg  on/about 11/09/2023  Starting Weight 244 lbs, 5% weight loss is 12 lbs to a weight of 231 lbs She is currently on weekly Zepbound  7.5 mg (since Today's weight 201 lbs with corresponding BMI 36.8  05/28/2024 ARTHROPLASTY, KNEE, TOTAL Right Spinal  Conversion of right partial knee      07/09/2024 ARTHROPLASTY, KNEE, TOTAL Left   Reviewed Bioimpedance Results with pt: Muscle Mass: -2.6 lbs Adipose Mass: -9.4 lbs  Reviewed hospitalization d/c notes- she is completing last round of oral ABX  She held Zebound 7.5mg  for three weeks while acutely ill  Restarted Zepbound  04/21/2024- experienced nausea without vomiting and extreme fatigue for 3 days 04/28/2024 Zepbound  7.5mg  injection- no SE 05/05/2024 Zepbound  7.5mg  injection- no SE  04/09/2024 Narrative & Impression  CLINICAL DATA:  Left-sided abdominal pain for 1 week. Pyelonephritis suspected.   IMPRESSION: 1. Findings consistent with chronic severe  hydronephrosis on the left with diffuse cortical thinning and no demonstrated contrast excretion. Surrounding soft tissue stranding appears mildly improved from the recent prior study. Findings suggest chronic left UPJ obstruction. See recent prior study which references prior outside imaging, and consider urology follow-up. 2. The right kidney appears normal. 3. No other acute findings. 4.  Aortic Atherosclerosis (ICD10-I70.0).  Subjective:   1. Bilateral Chronic Knee Pain 05/28/2024 ARTHROPLASTY, KNEE, TOTAL Right Spinal  Conversion of right partial knee      07/09/2024 ARTHROPLASTY, KNEE, TOTAL Left   She is able to perform ADLs She will have her roommate and close friend assist her after both orthopedic procedures.  2. Pre-diabetes Lab Results  Component Value Date   HGBA1C 5.5 12/11/2023   HGBA1C 5.9 (H) 07/13/2023   HGBA1C 5.8 (H) 01/04/2023    She is on daily Metformin  500mg  BID with meals and she recently resumed weekly Zepbound  7.5mg  She held Zebound 7.5mg  for three weeks while acutely ill Restarted Zepbound  04/21/2024- experienced nausea without vomiting and extreme fatigue for 3 days 04/28/2024 Zepbound  7.5mg  injection- no SE 05/05/2024 Zepbound  7.5mg  injection- no SE  She currently denies mass in neck, dysphagia, dyspepsia, persistent hoarseness, abdominal pain, or N/V/C   Due to nausea without vomiting after 04/21/2024/restarting GIP/GLP-1 therapy- recommend reducing Zepboung to 5mg  after knee procedures- she is agreeable to reduction  3. OSA (obstructive sleep apnea) Last use of CPAP mid 2024 She denies excessive daytime fatigue  Assessment/Plan:   1.Bilateral Chronic Knee Pain Continue to increase protein  intake and f/u with Ortho as directed  2. Pre-diabetes (Primary) Continue daily Metformin  500mg  BID with meals and weekly Zepbound  7.5mg  Decreased Zepbound  from 7.5mg  to 5mg  after knee surgeries  3. OSA (obstructive sleep apnea) Continue with weight loss  efforts  4. Obesity, current BMI 36.8 Refill and DECREASE tirzepatide  5 MG/0.5ML injection vial Inject 5 mg into the skin once a week. Dispense: 2 mL, Refills: 0 ordered   Hold GIP/GLP-1 therapy per orthopedic surgein  Restart Zepbound  5mg  after first knee surgery 05/28/2024  5. Morbid obesity (HCC), starting BMI 46.82  Melissa [name] is currently in the action stage of change. As such, her goal is to continue with weight loss efforts. She has agreed to the Category 3 Plan.   Exercise goals: All adults should avoid inactivity. Some physical activity is better than none, and adults who participate in any amount of physical activity gain some health benefits. Adults should also include muscle-strengthening activities that involve all major muscle groups on 2 or more days a week.  Behavioral modification strategies: increasing lean protein intake, decreasing simple carbohydrates, increasing vegetables, increasing water  intake, no skipping meals, meal planning and cooking strategies, keeping healthy foods in the home, and planning for success.  Melissa [name] has agreed to follow-up with our clinic in 5 weeks. She was informed of the importance of frequent follow-up visits to maximize her success with intensive lifestyle modifications for her multiple health conditions.   Objective:   Blood pressure 97/62, pulse 94, temperature 98.4 F (36.9 C), height 5' 2 (1.575 m), weight 201 lb (91.2 kg), last menstrual period 10/12/2012, SpO2 100%. Body mass index is 36.76 kg/m.  General: Cooperative, alert, well developed, in no acute distress. HEENT: Conjunctivae and lids unremarkable. Cardiovascular: Regular rhythm.  Lungs: Normal work of breathing. Neurologic: No focal deficits.   Lab Results  Component Value Date   CREATININE 0.83 04/17/2024   BUN 9 04/17/2024   NA 142 04/17/2024   K 3.9 04/17/2024   CL 106 04/17/2024   CO2 24 04/17/2024   Lab Results  Component Value Date   ALT 23 04/09/2024   AST  30 04/09/2024   ALKPHOS 158 (H) 04/09/2024   BILITOT 0.7 04/09/2024   Lab Results  Component Value Date   HGBA1C 5.5 12/11/2023   HGBA1C 5.9 (H) 07/13/2023   HGBA1C 5.8 (H) 01/04/2023   HGBA1C 5.7 (H) 05/05/2022   HGBA1C 5.7 (H) 01/11/2022   Lab Results  Component Value Date   INSULIN  9.2 12/11/2023   INSULIN  14.1 07/13/2023   INSULIN  14.0 01/04/2023   INSULIN  10.6 05/05/2022   INSULIN  8.3 01/11/2022   Lab Results  Component Value Date   TSH 1.580 12/01/2021   Lab Results  Component Value Date   CHOL 207 (H) 01/11/2022   HDL 86 01/11/2022   LDLCALC 109 (H) 01/11/2022   TRIG 69 01/11/2022   CHOLHDL 2.4 01/11/2022   Lab Results  Component Value Date   VD25OH 66.5 12/11/2023   VD25OH 47.9 07/13/2023   VD25OH 57.1 01/04/2023   Lab Results  Component Value Date   WBC 9.6 04/16/2024   HGB 8.3 (L) 04/16/2024   HCT 27.2 (L) 04/16/2024   MCV 88.9 04/16/2024   PLT 748 (H) 04/16/2024   Lab Results  Component Value Date   IRON 26 (L) 04/14/2024   TIBC 245 (L) 04/14/2024   FERRITIN 509 (H) 04/14/2024    Attestation Statements:   Reviewed by clinician on day of visit: allergies, medications, problem list, medical history,  surgical history, family history, social history, and previous encounter notes.  I have reviewed the above documentation for accuracy and completeness, and I agree with the above. -  Peni Rupard d. Kinjal Neitzke, NP-C

## 2024-05-09 ENCOUNTER — Other Ambulatory Visit (INDEPENDENT_AMBULATORY_CARE_PROVIDER_SITE_OTHER): Payer: Self-pay | Admitting: Adult Health

## 2024-05-15 NOTE — Patient Instructions (Addendum)
 SURGICAL WAITING ROOM VISITATION  Patients having surgery or a procedure may have no more than 2 support people in the waiting area - these visitors may rotate.    Children under the age of 37 must have an adult with them who is not the patient.  Visitors with respiratory illnesses are discouraged from visiting and should remain at home.  If the patient needs to stay at the hospital during part of their recovery, the visitor guidelines for inpatient rooms apply. Pre-op nurse will coordinate an appropriate time for 1 support person to accompany patient in pre-op.  This support person may not rotate.    Please refer to the [facility] website for the visitor guidelines for Inpatients (after your surgery is over and you are in a regular room).       Your procedure is scheduled on: 05/28/2024    Report to [facility] Main Entrance    Report to admitting at  0715 AM   Call this number if you have problems the morning of surgery [phone]   Do not eat food :After Midnight.   After Midnight you may have the following liquids until _ 0645_____ AM DAY OF SURGERY  Water  Non-Citrus Juices (without pulp, NO RED-Apple, White grape, White cranberry) Black Coffee (NO MILK/CREAM OR CREAMERS, sugar ok)  Clear Tea (NO MILK/CREAM OR CREAMERS, sugar ok) regular and decaf                             Plain Jell-O (NO RED)                                           Fruit ices (not with fruit pulp, NO RED)                                     Popsicles (NO RED)                                                               Sports drinks like Gatorade (NO RED)                     The day of surgery:  Drink ONE (1) Pre-Surgery Clear Ensure or G2 at  0645AM the morning of surgery. Drink in one sitting. Do not sip.  This drink was given to you during your hospital  pre-op appointment visit. Nothing else to drink after completing the  Pre-Surgery Clear Ensure or G2.          If you have  questions, please contact your surgeon's office.      Oral Hygiene is also important to reduce your risk of infection.                                    Remember - BRUSH YOUR TEETH THE MORNING OF SURGERY WITH YOUR REGULAR TOOTHPASTE  DENTURES WILL BE REMOVED PRIOR TO SURGERY PLEASE DO NOT APPLY Poly grip OR ADHESIVES!!!  Do NOT smoke after Midnight   Stop all vitamins and herbal supplements 7 days before surgery.   Take these medicines the morning of surgery with A SIP OF WATER :  inhalers as usual and bring, wellbutrin , synthroid , prozAc , pepcid                 Metformin - none am of surgery             Zepbound - last dose on   DO NOT TAKE ANY ORAL DIABETIC MEDICATIONS DAY OF YOUR SURGERY  Bring CPAP mask and tubing day of surgery.                              You may not have any metal on your body including hair pins, jewelry, and body piercing             Do not wear make-up, lotions, powders, perfumes/cologne, or deodorant  Do not wear nail polish including gel and S&S, artificial/acrylic nails, or any other type of covering on natural nails including finger and toenails. If you have artificial nails, gel coating, etc. that needs to be removed by a nail salon please have this removed prior to surgery or surgery may need to be canceled/ delayed if the surgeon/ anesthesia feels like they are unable to be safely monitored.   Do not shave  48 hours prior to surgery.               Men may shave face and neck.   Do not bring valuables to the hospital. [facility] IS NOT             RESPONSIBLE   FOR VALUABLES.   Contacts, glasses, dentures or bridgework may not be worn into surgery.   Bring small overnight bag day of surgery.   DO NOT BRING YOUR HOME MEDICATIONS TO THE HOSPITAL. PHARMACY WILL DISPENSE MEDICATIONS LISTED ON YOUR MEDICATION LIST TO YOU DURING YOUR ADMISSION IN THE HOSPITAL!    Patients discharged on the day of surgery will not be allowed to drive home.   Someone NEEDS to stay with you for the first 24 hours after anesthesia.   Special Instructions: Bring a copy of your healthcare power of attorney and living will documents the day of surgery if you haven't scanned them before.              Please read over the following fact sheets you were given: IF YOU HAVE QUESTIONS ABOUT YOUR PRE-OP INSTRUCTIONS PLEASE CALL [phone].   If you received a COVID test during your pre-op visit  it is requested that you wear a mask when out in public, stay away from anyone that may not be feeling well and notify your surgeon if you develop symptoms. If you test positive for Covid or have been in contact with anyone that has tested positive in the last 10 days please notify you surgeon.      Pre-operative 5 CHG Bath Instructions   You can play a key role in reducing the risk of infection after surgery. Your skin needs to be as free of germs as possible. You can reduce the number of germs on your skin by washing with CHG (chlorhexidine  gluconate) soap before surgery. CHG is an antiseptic soap that kills germs and continues to kill germs even after washing.   DO NOT use if you have an allergy to chlorhexidine /CHG or antibacterial soaps. If your skin becomes reddened  or irritated, stop using the CHG and notify one of our RNs at [phone].   Please shower with the CHG soap starting 4 days before surgery using the following schedule:     Please keep in mind the following:  DO NOT shave, including legs and underarms, starting the day of your first shower.   You may shave your face at any point before/day of surgery.  Place clean sheets on your bed the day you start using CHG soap. Use a clean washcloth (not used since being washed) for each shower. DO NOT sleep with pets once you start using the CHG.   CHG Shower Instructions:  If you choose to wash your hair and private area, wash first with your normal shampoo/soap.  After you use shampoo/soap, rinse your  hair and body thoroughly to remove shampoo/soap residue.  Turn the water  OFF and apply about 3 tablespoons (45 ml) of CHG soap to a CLEAN washcloth.  Apply CHG soap ONLY FROM YOUR NECK DOWN TO YOUR TOES (washing for 3-5 minutes)  DO NOT use CHG soap on face, private areas, open wounds, or sores.  Pay special attention to the area where your surgery is being performed.  If you are having back surgery, having someone wash your back for you may be helpful. Wait 2 minutes after CHG soap is applied, then you may rinse off the CHG soap.  Pat dry with a clean towel  Put on clean clothes/pajamas   If you choose to wear lotion, please use ONLY the CHG-compatible lotions on the back of this paper.     Additional instructions for the day of surgery: DO NOT APPLY any lotions, deodorants, cologne, or perfumes.   Put on clean/comfortable clothes.  Brush your teeth.  Ask your nurse before applying any prescription medications to the skin.      CHG Compatible Lotions   Aveeno Moisturizing lotion  Cetaphil Moisturizing Cream  Cetaphil Moisturizing Lotion  Clairol Herbal Essence Moisturizing Lotion, Dry Skin  Clairol Herbal Essence Moisturizing Lotion, Extra Dry Skin  Clairol Herbal Essence Moisturizing Lotion, Normal Skin  Curel Age Defying Therapeutic Moisturizing Lotion with Alpha Hydroxy  Curel Extreme Care Body Lotion  Curel Soothing Hands Moisturizing Hand Lotion  Curel Therapeutic Moisturizing Cream, Fragrance-Free  Curel Therapeutic Moisturizing Lotion, Fragrance-Free  Curel Therapeutic Moisturizing Lotion, Original Formula  Eucerin Daily Replenishing Lotion  Eucerin Dry Skin Therapy Plus Alpha Hydroxy Crme  Eucerin Dry Skin Therapy Plus Alpha Hydroxy Lotion  Eucerin Original Crme  Eucerin Original Lotion  Eucerin Plus Crme Eucerin Plus Lotion  Eucerin TriLipid Replenishing Lotion  Keri Anti-Bacterial Hand Lotion  Keri Deep Conditioning Original Lotion Dry Skin Formula Softly  Scented  Keri Deep Conditioning Original Lotion, Fragrance Free Sensitive Skin Formula  Keri Lotion Fast Absorbing Fragrance Free Sensitive Skin Formula  Keri Lotion Fast Absorbing Softly Scented Dry Skin Formula  Keri Original Lotion  Keri Skin Renewal Lotion Keri Silky Smooth Lotion  Keri Silky Smooth Sensitive Skin Lotion  Nivea Body Creamy Conditioning Oil  Nivea Body Extra Enriched Teacher, adult education Moisturizing Lotion Nivea Crme  Nivea Skin Firming Lotion  NutraDerm 30 Skin Lotion  NutraDerm Skin Lotion  NutraDerm Therapeutic Skin Cream  NutraDerm Therapeutic Skin Lotion  ProShield Protective Hand Cream  Provon moisturizing lotion

## 2024-05-15 NOTE — Progress Notes (Signed)
 Anesthesia Review:  PCP: Cardiologist :  PPM/ ICD: Device Orders: Rep Notified:  Chest x-ray : EKG : Echo : Stress test: Cardiac Cath :   Activity level:  Sleep Study/ CPAP : Fasting Blood Sugar :      / Checks Blood Sugar -- times a day:     Pre Diabetes   Blood Thinner/ Instructions /Last Dose: ASA / Instructions/ Last Dose :

## 2024-05-16 ENCOUNTER — Encounter (HOSPITAL_COMMUNITY): Payer: Self-pay

## 2024-05-16 ENCOUNTER — Other Ambulatory Visit: Payer: Self-pay

## 2024-05-16 ENCOUNTER — Encounter (HOSPITAL_COMMUNITY)
Admission: RE | Admit: 2024-05-16 | Discharge: 2024-05-16 | Disposition: A | Discharge status: Home / Self Care | Source: Ambulatory Visit | Attending: Orthopedic Surgery | Admitting: Orthopedic Surgery

## 2024-05-16 VITALS — BP 120/80 | HR 100 | Temp 98.4°F | Resp 16 | Ht 62.0 in | Wt 201.0 lb

## 2024-05-16 DIAGNOSIS — N261 Atrophy of kidney (terminal): Secondary | ICD-10-CM | POA: Insufficient documentation

## 2024-05-16 DIAGNOSIS — E039 Hypothyroidism, unspecified: Secondary | ICD-10-CM | POA: Diagnosis not present

## 2024-05-16 DIAGNOSIS — Z01818 Encounter for other preprocedural examination: Secondary | ICD-10-CM | POA: Diagnosis not present

## 2024-05-16 DIAGNOSIS — Z01810 Encounter for preprocedural cardiovascular examination: Secondary | ICD-10-CM | POA: Diagnosis present

## 2024-05-16 DIAGNOSIS — F419 Anxiety disorder, unspecified: Secondary | ICD-10-CM | POA: Insufficient documentation

## 2024-05-16 DIAGNOSIS — T84022A Instability of internal right knee prosthesis, initial encounter: Secondary | ICD-10-CM | POA: Insufficient documentation

## 2024-05-16 DIAGNOSIS — D649 Anemia, unspecified: Secondary | ICD-10-CM | POA: Diagnosis not present

## 2024-05-16 DIAGNOSIS — Z01812 Encounter for preprocedural laboratory examination: Secondary | ICD-10-CM | POA: Diagnosis present

## 2024-05-16 DIAGNOSIS — K219 Gastro-esophageal reflux disease without esophagitis: Secondary | ICD-10-CM | POA: Diagnosis not present

## 2024-05-16 DIAGNOSIS — I10 Essential (primary) hypertension: Secondary | ICD-10-CM | POA: Insufficient documentation

## 2024-05-16 DIAGNOSIS — R7303 Prediabetes: Secondary | ICD-10-CM | POA: Diagnosis not present

## 2024-05-16 DIAGNOSIS — G4733 Obstructive sleep apnea (adult) (pediatric): Secondary | ICD-10-CM | POA: Diagnosis not present

## 2024-05-16 DIAGNOSIS — F32A Depression, unspecified: Secondary | ICD-10-CM | POA: Insufficient documentation

## 2024-05-16 LAB — SURGICAL PCR SCREEN
MRSA, PCR: NEGATIVE
Staphylococcus aureus: NEGATIVE

## 2024-05-16 LAB — CBC
HCT: 38.5 % (ref 36.0–46.0)
Hemoglobin: 11.4 g/dL — ABNORMAL LOW (ref 12.0–15.0)
MCH: 26.5 pg (ref 26.0–34.0)
MCHC: 29.6 g/dL — ABNORMAL LOW (ref 30.0–36.0)
MCV: 89.3 fL (ref 80.0–100.0)
Platelets: 384 K/uL (ref 150–400)
RBC: 4.31 MIL/uL (ref 3.87–5.11)
RDW: 14.8 % (ref 11.5–15.5)
WBC: 6.2 K/uL (ref 4.0–10.5)
nRBC: 0 % (ref 0.0–0.2)

## 2024-05-16 LAB — BASIC METABOLIC PANEL WITH GFR
Anion gap: 10 (ref 5–15)
BUN: 14 mg/dL (ref 6–20)
CO2: 23 mmol/L (ref 22–32)
Calcium: 9.6 mg/dL (ref 8.9–10.3)
Chloride: 104 mmol/L (ref 98–111)
Creatinine, Ser: 0.72 mg/dL (ref 0.44–1.00)
GFR, Estimated: >60 mL/min (ref >60)
Glucose, Bld: 110 mg/dL — ABNORMAL HIGH (ref 70–99)
Potassium: 4.5 mmol/L (ref 3.5–5.1)
Sodium: 137 mmol/L (ref 135–145)

## 2024-05-20 ENCOUNTER — Encounter (HOSPITAL_COMMUNITY): Payer: Self-pay

## 2024-05-20 NOTE — Anesthesia Preprocedure Evaluation (Addendum)
 Anesthesia Evaluation  Patient identified by MRN, date of birth, ID band Patient awake    Reviewed: Allergy & Precautions, NPO status , Patient's Chart, lab work & pertinent test results  Airway Mallampati: II  TM Distance: >3 FB Neck ROM: Full    Dental no notable dental hx.    Pulmonary sleep apnea    Pulmonary exam normal        Cardiovascular hypertension, Pt. on medications Normal cardiovascular exam     Neuro/Psych  PSYCHIATRIC DISORDERS Anxiety Depression    negative neurological ROS     GI/Hepatic Neg liver ROS,GERD  Medicated and Controlled,,  Endo/Other  Hypothyroidism  Class 3 obesity  Renal/GU negative Renal ROS     Musculoskeletal  (+) Arthritis ,    Abdominal  (+) + obese  Peds  Hematology negative hematology ROS (+)   Anesthesia Other Findings   Reproductive/Obstetrics                              Anesthesia Physical Anesthesia Plan  ASA: 3  Anesthesia Plan: Regional, MAC and Spinal   Post-op Pain Management: Regional block*   Induction: Intravenous  PONV Risk Score and Plan: 3 and Ondansetron , Treatment may vary due to age or medical condition and Propofol  infusion  Airway Management Planned: Natural Airway, Simple Face Mask and Nasal Cannula  Additional Equipment: None  Intra-op Plan:   Post-operative Plan: Extubation in OR  Informed Consent: I have reviewed the patients History and Physical, chart, labs and discussed the procedure including the risks, benefits and alternatives for the proposed anesthesia with the patient or authorized representative who has indicated his/her understanding and acceptance.     Dental advisory given  Plan Discussed with: CRNA and Anesthesiologist  Anesthesia Plan Comments: (See PAT note from 7/24  DISCUSSION: Melissa [name] is a 58 year old female with past medical history of HTN, OSA (no CPAP use), GERD, hypothyroidism,  prediabetes, left renal atrophy, anemia, anxiety, depression, arthritis   Patient is to have R TKA on 8/5 and L TKA on 9/16   Patient admitted from 6/17 to 6/25 due to pyelonephritis.  Urology was consulted and advised that she would eventually need a left nephrectomy as an outpatient.  She was prescribed Keflex  to take through her bilateral knee surgeries that are planned and nephrectomy would likely take place in Dec 2025.  EKG 05/16/24:   Normal sinus rhythm, rate 80 Poor R wave progression Borderline ECG When compared with ECG of 04-May-2022 09:13, No significant change was found     )        Anesthesia Quick Evaluation

## 2024-05-20 NOTE — Progress Notes (Signed)
 Case: [id number] Date/Time: 05/28/24 0940   Procedure: ARTHROPLASTY, KNEE, TOTAL (Right: Knee) - Conversion of right partial knee arthroplasty to right total knee arthroplasty   Anesthesia type: Spinal   Diagnosis: Presence of right artificial knee joint [Z96.651]   Pre-op diagnosis: Right knee osteoarthritis, failed partial knee arthroplasty   Location: [facility]OM 09 / [facility]   Surgeons: Ernie Cough, MD       DISCUSSION: Melissa [name] is a 58 year old female with past medical history of HTN, OSA (no CPAP use), GERD, hypothyroidism, prediabetes, left renal atrophy, anemia, anxiety, depression, arthritis  Patient is to have R TKA on 8/5 and L TKA on 9/16  Patient admitted from 6/17 to 6/25 due to pyelonephritis.  Urology was consulted and advised that she would eventually need a left nephrectomy as an outpatient.  She was prescribed Keflex  to take through her bilateral knee surgeries that are planned and nephrectomy would likely take place in Dec 2025.  Seen by PCP for follow up on 05/03/24. Noted to be improved.   VS: BP 120/80   Pulse 100   Temp 36.9 C (Oral)   Resp 16   Ht 5' 2 (1.575 m)   Wt 91.2 kg   LMP 10/12/2012   SpO2 100%   BMI 36.76 kg/m   PROVIDERS: Sophronia Ozell BROCKS, MD   LABS: Labs reviewed: Acceptable for surgery. Anemia improved. (all labs ordered are listed, but only abnormal results are displayed)  Labs Reviewed  BASIC METABOLIC PANEL WITH GFR - Abnormal; Notable for the following components:      Result Value   Glucose, Bld 110 (*)    All other components within normal limits  CBC - Abnormal; Notable for the following components:   Hemoglobin 11.4 (*)    MCHC 29.6 (*)    All other components within normal limits  SURGICAL PCR SCREEN     IMAGES: CT A/P 04/09/24:  IMPRESSION: 1. Findings consistent with chronic severe hydronephrosis on the left with diffuse cortical thinning and no demonstrated contrast excretion. Surrounding soft tissue  stranding appears mildly improved from the recent prior study. Findings suggest chronic left UPJ obstruction. See recent prior study which references prior outside imaging, and consider urology follow-up. 2. The right kidney appears normal. 3. No other acute findings. 4.  Aortic Atherosclerosis (ICD10-I70.0).  EKG 05/16/24:  Normal sinus rhythm, rate 80 Poor R wave progression Borderline ECG When compared with ECG of 04-May-2022 09:13, No significant change was found  CV:  Past Medical History:  Diagnosis Date   Allergic rhinitis    Anxiety    Arthritis    Back pain    Constipated    Depression    Diaphragmatic hernia    Fatty liver    GERD (gastroesophageal reflux disease)    History of kidney stones    Hypertension    Hypothyroidism    IBS (irritable bowel syndrome)    Joint pain    Kidney problem    one functioning kidney   Lactose intolerance    Morbid obesity (HCC)    OSA (obstructive sleep apnea)    Osteoarthritis    Pre-diabetes    Seasonal allergies    Tinnitus    Vertigo    Vitamin D  deficiency     Past Surgical History:  Procedure Laterality Date   ABDOMINAL EXPOSURE N/A 05/09/2022   Procedure: ABDOMINAL EXPOSURE;  Surgeon: Gretta Lonni PARAS, MD;  Location: [facility] OR;  Service: Vascular;  Laterality: N/A;   bilateral thumb surgery  CARPAL TUNNEL RELEASE     rt/lt   COLONOSCOPY     FINGER ARTHRODESIS  11/08/2011   Procedure: ARTHRODESIS FINGER;  Surgeon: Lamar LULLA Leonor Mickey., MD;  Location: [facility];  Service: Orthopedics;  Laterality: Left;  left thumb carpal-metacarpal fusion   KNEE ARTHROSCOPY     both knees   OBLIQUE LUMBAR INTERBODY FUSION 1 LEVEL WITH PERCUTANEOUS SCREWS N/A 05/09/2022   Procedure: OBLIQUE LUMBAR INTERBODY FUSION 1 LEVEL WITH PERCUTANEOUS SCREWS (OLIF L4-5 WITH POSTERIOR SPINAL FUSION INTERBODY);  Surgeon: Burnetta Aures, MD;  Location: [facility] OR;  Service: Orthopedics;  Laterality: N/A;  4 hrs Dr. Gretta to do  approach Left tap block with exparel    PARTIAL KNEE ARTHROPLASTY  11/05/2012   Procedure: UNICOMPARTMENTAL KNEE;  Surgeon: [name], MD;  [facility];  Service: Orthopedics;  Laterality: Right;   TONSILLECTOMY     TOTAL SHOULDER ARTHROPLASTY Right 06/25/2020   Procedure: TOTAL SHOULDER ARTHROPLASTY;  Surgeon: Sharl Selinda Dover, MD;  Location[facility];  Service: Orthopedics;  Laterality: Right;  2.5 hrs   TOTAL SHOULDER REVISION Right 02/15/2023   Procedure: TOTAL SHOULDER REVISION;  Surgeon: Cristy Bonner DASEN, MD;  Location[facility];  Service: Orthopedics;  Laterality: Right;   URETER SURGERY     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-little lt kidney function, has one functioning kidney   WISDOM TOOTH EXTRACTION      MEDICATIONS:  acetaminophen  (TYLENOL ) 500 MG tablet   Ascorbic Acid (VITAMIN C) 1000 MG tablet   aspirin  81 MG chewable tablet   BREYNA 80-4.5 MCG/ACT inhaler   buPROPion  (WELLBUTRIN  XL) 300 MG 24 hr tablet   cephALEXin  (KEFLEX ) 250 MG capsule   chlorpheniramine (CHLOR-TRIMETON) 4 MG tablet   diphenhydrAMINE  (BENADRYL ) 25 mg capsule   EPINEPHrine  0.3 mg/0.3 mL IJ SOAJ injection   famotidine  (PEPCID ) 20 MG tablet   FLUoxetine  (PROZAC ) 40 MG capsule   levothyroxine  (SYNTHROID ) 125 MCG tablet   losartan  (COZAAR ) 25 MG tablet   meclizine  (ANTIVERT ) 25 MG tablet   metFORMIN  (GLUCOPHAGE ) 500 MG tablet   montelukast  (SINGULAIR ) 10 MG tablet   Naphazoline-Pheniramine ([employer] EYE ALLERGY RELIEF OP)   omeprazole (PRILOSEC) 40 MG capsule   rosuvastatin  (CRESTOR ) 10 MG tablet   tirzepatide  (ZEPBOUND ) 7.5 MG/0.5ML Pen   triamcinolone  cream (KENALOG ) 0.5 %   VITAMIN D  PO   No current facility-administered medications for this encounter.   Burnard CHRISTELLA Odis DEVONNA [facility] Short Stay/Anesthesiology [facility] Phone [phone] 05/20/2024 11:24 AM

## 2024-05-27 NOTE — H&P (Signed)
 TOTAL KNEE ADMISSION H&P  Patient is being admitted for right total knee arthroplasty.  Therapy Plans: outpatient therapy at [facility] Disposition: Home with roommate and best friend Planned DVT Prophylaxis: aspirin  81mg  BID DME needed: walker PCP: Kristin Shepperson - clearance received TXA: IV Allergies: bee venom - anaphylaxis Anesthesia Concerns: none BMI: 37.6 Last HgbA1c: 6.1%   Other: - staying overnight - History of pyelonephritis -- planning for nephrectomy after knee surgeries (Dr. Cam) - oxycodone , flexeril , tylenol , celebrex  - No hx of VTE or cancer - Has an ice machine  Subjective:  Chief Complaint:right knee pain.  HPI: Melissa [name], 58 y.o. female, has a history of pain and functional disability in the right knee due to arthritis and has failed non-surgical conservative treatments for greater than 12 weeks to includeNSAID's and/or analgesics, corticosteriod injections, and activity modification.  Onset of symptoms was gradual, starting 2 years ago with gradually worsening course since that time. The patient noted no past surgery on the right knee(s).  Patient currently rates pain in the right knee(s) at 8 out of 10 with activity. Patient has worsening of pain with activity and weight bearing, pain that interferes with activities of daily living, and pain with passive range of motion.  Patient has evidence of joint space narrowing by imaging studies. There is no active infection.  Patient Active Problem List   Diagnosis Date Noted   Acute pyelonephritis 04/09/2024   Hypokalemia 04/09/2024   HTN (hypertension) 04/09/2024   Anxiety 04/09/2024   Status post reverse total arthroplasty of right shoulder 02/15/2023   Bilateral chronic knee pain 11/09/2022   Depression 09/19/2022   DOE (dyspnea on exertion) 08/18/2022   Allergic rhinitis 08/18/2022   S/P lumbar fusion 05/09/2022   Lumbar spine pain 03/08/2022   Insulin  resistance 01/11/2022   SOB (shortness of  breath) 01/11/2022   Hyperkalemia 08/11/2021   Major depressive disorder, recurrent episode, mild with anxious distress (HCC) 06/21/2021   Lumbar radiculopathy 06/01/2021   HLD (hyperlipidemia) 06/01/2021   Hypothyroidism 11/30/2020   Prediabetes 11/30/2020   Constipated    DDD (degenerative disc disease), cervical 08/09/2018   Neck pain 07/21/2018   Chronic back pain 04/03/2018   Spondylolisthesis 04/03/2018   Osteoarthritis of right knee 02/23/2018   Osteoarthritis of left knee 02/05/2018   Acquired hypothyroidism 12/13/2016   History of systemic reaction to bee sting 12/13/2016   Atrophy of left kidney 09/10/2015   Hydronephrosis of right kidney 09/10/2015   Obstructive sleep apnea syndrome, mild 08/05/2014   Expected blood loss anemia 11/06/2012   Obesity (BMI 30-39.9) 11/06/2012   S/P right UKR 11/05/2012   Major depression, recurrent, chronic (HCC) 02/08/2011   Vitamin D  deficiency 04/30/2010   Diaphragmatic hernia 09/11/2007   Past Medical History:  Diagnosis Date   Allergic rhinitis    Anxiety    Arthritis    Back pain    Constipated    Depression    Diaphragmatic hernia    Fatty liver    GERD (gastroesophageal reflux disease)    History of kidney stones    Hypertension    Hypothyroidism    IBS (irritable bowel syndrome)    Joint pain    Kidney problem    one functioning kidney due to left renal atrophy   Lactose intolerance    Morbid obesity (HCC)    OSA (obstructive sleep apnea)    Osteoarthritis    Pre-diabetes    Seasonal allergies    Tinnitus    Vertigo  Vitamin D  deficiency     Past Surgical History:  Procedure Laterality Date   ABDOMINAL EXPOSURE N/A 05/09/2022   Procedure: ABDOMINAL EXPOSURE;  Surgeon: Gretta Lonni PARAS, MD;  Location: [facility] OR;  Service: Vascular;  Laterality: N/A;   bilateral thumb surgery      CARPAL TUNNEL RELEASE     rt/lt   COLONOSCOPY     FINGER ARTHRODESIS  11/08/2011   Procedure: ARTHRODESIS FINGER;  Surgeon:  Lamar LULLA Leonor Mickey., MD;  Location: [facility];  Service: Orthopedics;  Laterality: Left;  left thumb carpal-metacarpal fusion   KNEE ARTHROSCOPY     both knees   OBLIQUE LUMBAR INTERBODY FUSION 1 LEVEL WITH PERCUTANEOUS SCREWS N/A 05/09/2022   Procedure: OBLIQUE LUMBAR INTERBODY FUSION 1 LEVEL WITH PERCUTANEOUS SCREWS (OLIF L4-5 WITH POSTERIOR SPINAL FUSION INTERBODY);  Surgeon: Burnetta Aures, MD;  Location: [facility] OR;  Service: Orthopedics;  Laterality: N/A;  4 hrs Dr. Gretta to do approach Left tap block with exparel    PARTIAL KNEE ARTHROPLASTY  11/05/2012   Procedure: UNICOMPARTMENTAL KNEE;  Surgeon: [name], MD;  [facility];  Service: Orthopedics;  Laterality: Right;   TONSILLECTOMY     TOTAL SHOULDER ARTHROPLASTY Right 06/25/2020   Procedure: TOTAL SHOULDER ARTHROPLASTY;  Surgeon: Sharl Selinda Dover, MD;  [facility];  Service: Orthopedics;  Laterality: Right;  2.5 hrs   TOTAL SHOULDER REVISION Right 02/15/2023   Procedure: TOTAL SHOULDER REVISION;  Surgeon: Cristy Bonner DASEN, MD;  Location[facility];  Service: Orthopedics;  Laterality: Right;   URETER SURGERY     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   Current Outpatient Medications  Medication Sig Dispense Refill Last Dose/Taking   acetaminophen  (TYLENOL ) 500 MG tablet Take 2 tablets (1,000 mg total) by mouth every 6 (six) hours as needed (pain). (Patient taking differently: Take 1,500 mg by mouth at bedtime.)   Taking Differently   Ascorbic Acid (VITAMIN C) 1000 MG tablet Take 1,000 mg by mouth daily.   Taking   aspirin  81 MG chewable tablet Chew 81 mg by mouth.   Taking   BREYNA 80-4.5 MCG/ACT inhaler Inhale 2 puffs into the lungs daily as needed (shortness of breath).   Taking As Needed   buPROPion  (WELLBUTRIN  XL) 300 MG 24 hr tablet Take 300 mg by mouth  every morning.   Taking   cephALEXin  (KEFLEX ) 250 MG capsule Take 250 mg by mouth daily.   Taking   chlorpheniramine (CHLOR-TRIMETON) 4 MG tablet Take 4 mg by mouth in the morning.   Taking   diphenhydrAMINE  (BENADRYL ) 25 mg capsule Take 1 capsule (25 mg total) by mouth every 6 (six) hours as needed for itching, allergies or sleep. (Patient taking differently: Take 75 mg by mouth at bedtime.) 30 capsule  Taking Differently   EPINEPHrine  0.3 mg/0.3 mL IJ SOAJ injection Inject 0.3 mg into the muscle as needed for anaphylaxis. 1 each 0 Taking As Needed   famotidine  (PEPCID ) 20 MG tablet Take 20 mg by mouth 2 (two) times daily.   Taking   FLUoxetine  (PROZAC ) 40 MG capsule Take 40 mg by mouth daily.   Taking   levothyroxine  (SYNTHROID ) 125 MCG tablet Take 125 mcg by mouth daily before breakfast.   Taking   losartan  (COZAAR ) 25 MG tablet Take 25 mg by mouth daily.   Taking   meclizine  (ANTIVERT ) 25 MG tablet  Take 25 mg by mouth 3 (three) times daily as needed for dizziness.   Taking As Needed   metFORMIN  (GLUCOPHAGE ) 500 MG tablet 1 tab at lunch 1 tab at dinner (Patient taking differently: Take 500 mg by mouth daily.) 180 tablet 0 Taking Differently   montelukast  (SINGULAIR ) 10 MG tablet Take 10 mg by mouth at bedtime.   Taking   Naphazoline-Pheniramine ([employer] EYE ALLERGY RELIEF OP) Place 1 drop into both eyes daily as needed (allergies).   Taking As Needed   omeprazole (PRILOSEC) 40 MG capsule Take 40 mg by mouth at bedtime.    Taking   rosuvastatin  (CRESTOR ) 10 MG tablet Take 10 mg by mouth.   Taking   tirzepatide  (ZEPBOUND ) 7.5 MG/0.5ML Pen Inject 7.5 mg into the skin once a week. 6 mL 0 Taking   triamcinolone  cream (KENALOG ) 0.5 % Apply 1 Application topically daily as needed (itching).   Taking As Needed   VITAMIN D  PO Take 2,000 Units by mouth daily.   Taking   Allergies  Allergen Reactions   Bee Venom Hives   Lactose Intolerance (Gi) Other (See Comments)    Social History   Tobacco Use    Smoking status: Never   Smokeless tobacco: Never  Substance Use Topics   Alcohol use: No    Comment: quit 17 yrs ago    Family History  Problem Relation Age of Onset   Arthritis Mother        spianl stenosis   Atrial fibrillation Mother    Transient ischemic attack Mother    High Cholesterol Mother    Diabetes Father    Heart attack Father        3 stents   Heart disease Father    Atrial fibrillation Sister    Depression Sister    Transient ischemic attack Maternal Aunt    Stroke Maternal Grandmother    Diabetes Paternal Grandmother      Review of Systems  Constitutional:  Negative for chills and fever.  Respiratory:  Negative for cough and shortness of breath.   Cardiovascular:  Negative for chest pain.  Gastrointestinal:  Negative for nausea and vomiting.  Musculoskeletal:  Positive for arthralgias.     Objective:  Physical Exam Well nourished and well developed. General: Alert and oriented x3, cooperative and pleasant, no acute distress.  Musculoskeletal: Right knee exam: Her previous midline incision is healed without signs of infection She has a slight flexion contracture and flexes to about 110 degrees She has global knee tenderness Stable medial and lateral collateral ligaments   Left knee exam: No palpable effusion, warmth erythema Slight flexion contracture with flexion of 110 degrees Tenderness over the medial and anterior aspect the knee No lower extremity edema, erythema or calf tenderness  Vital signs in last 24 hours:    Labs:   Estimated body mass index is 36.76 kg/m as calculated from the following:   Height as of 05/16/24: 5' 2 (1.575 m).   Weight as of 05/16/24: 91.2 kg.   Imaging Review Plain radiographs demonstrate severe degenerative joint disease of the right knee(s). The overall alignment isneutral. The bone quality appears to be adequate for age and reported activity level.      Assessment/Plan:  End stage arthritis,  right knee   The patient history, physical examination, clinical judgment of the provider and imaging studies are consistent with end stage degenerative joint disease of the right knee(s) and total knee arthroplasty is deemed medically necessary. The treatment options including  medical management, injection therapy arthroscopy and arthroplasty were discussed at length. The risks and benefits of total knee arthroplasty were presented and reviewed. The risks due to aseptic loosening, infection, stiffness, patella tracking problems, thromboembolic complications and other imponderables were discussed. The patient acknowledged the explanation, agreed to proceed with the plan and consent was signed. Patient is being admitted for inpatient treatment for surgery, pain control, PT, OT, prophylactic antibiotics, VTE prophylaxis, progressive ambulation and ADL's and discharge planning. The patient is planning to be discharged home.     Patient's anticipated LOS is less than 2 midnights, meeting these requirements: - Younger than 56 - Lives within 1 hour of care - Has a competent adult at home to recover with post-op recover - NO history of  - Chronic pain requiring opiods  - Diabetes  - Coronary Artery Disease  - Heart failure  - Heart attack  - Stroke  - DVT/VTE  - Cardiac arrhythmia  - Respiratory Failure/COPD  - Renal failure  - Anemia  - Advanced Liver disease  Rosina Calin, PA-C Orthopedic Surgery [facility] [place] [phone]

## 2024-05-28 ENCOUNTER — Ambulatory Visit (HOSPITAL_COMMUNITY): Payer: Self-pay | Admitting: Medical

## 2024-05-28 ENCOUNTER — Encounter (HOSPITAL_COMMUNITY): Payer: Self-pay | Admitting: Orthopedic Surgery

## 2024-05-28 ENCOUNTER — Encounter (HOSPITAL_COMMUNITY): Admission: RE | Payer: Self-pay | Source: Ambulatory Visit

## 2024-05-28 ENCOUNTER — Ambulatory Visit (HOSPITAL_COMMUNITY): Payer: Self-pay | Admitting: Anesthesiology

## 2024-05-28 ENCOUNTER — Other Ambulatory Visit: Payer: Self-pay

## 2024-05-28 ENCOUNTER — Observation Stay (HOSPITAL_COMMUNITY)
Admission: RE | Admit: 2024-05-28 | Discharge: 2024-05-29 | Disposition: A | Discharge status: Home / Self Care | Source: Ambulatory Visit | Attending: Orthopedic Surgery | Admitting: Orthopedic Surgery

## 2024-05-28 DIAGNOSIS — I2510 Atherosclerotic heart disease of native coronary artery without angina pectoris: Secondary | ICD-10-CM | POA: Diagnosis not present

## 2024-05-28 DIAGNOSIS — T84092A Other mechanical complication of internal right knee prosthesis, initial encounter: Secondary | ICD-10-CM | POA: Diagnosis not present

## 2024-05-28 DIAGNOSIS — E1159 Type 2 diabetes mellitus with other circulatory complications: Secondary | ICD-10-CM | POA: Insufficient documentation

## 2024-05-28 DIAGNOSIS — Y838 Other surgical procedures as the cause of abnormal reaction of the patient, or of later complication, without mention of misadventure at the time of the procedure: Secondary | ICD-10-CM | POA: Diagnosis not present

## 2024-05-28 DIAGNOSIS — Z79891 Long term (current) use of opiate analgesic: Secondary | ICD-10-CM | POA: Insufficient documentation

## 2024-05-28 DIAGNOSIS — F418 Other specified anxiety disorders: Secondary | ICD-10-CM | POA: Diagnosis not present

## 2024-05-28 DIAGNOSIS — E039 Hypothyroidism, unspecified: Secondary | ICD-10-CM | POA: Diagnosis not present

## 2024-05-28 DIAGNOSIS — Z96651 Presence of right artificial knee joint: Secondary | ICD-10-CM | POA: Diagnosis not present

## 2024-05-28 DIAGNOSIS — Z7982 Long term (current) use of aspirin: Secondary | ICD-10-CM | POA: Diagnosis not present

## 2024-05-28 DIAGNOSIS — J449 Chronic obstructive pulmonary disease, unspecified: Secondary | ICD-10-CM | POA: Insufficient documentation

## 2024-05-28 DIAGNOSIS — T8484XA Pain due to internal orthopedic prosthetic devices, implants and grafts, initial encounter: Principal | ICD-10-CM | POA: Insufficient documentation

## 2024-05-28 DIAGNOSIS — M1711 Unilateral primary osteoarthritis, right knee: Principal | ICD-10-CM | POA: Insufficient documentation

## 2024-05-28 DIAGNOSIS — I10 Essential (primary) hypertension: Secondary | ICD-10-CM | POA: Diagnosis not present

## 2024-05-28 DIAGNOSIS — M175 Other unilateral secondary osteoarthritis of knee: Secondary | ICD-10-CM | POA: Insufficient documentation

## 2024-05-28 DIAGNOSIS — M1731 Unilateral post-traumatic osteoarthritis, right knee: Secondary | ICD-10-CM | POA: Insufficient documentation

## 2024-05-28 DIAGNOSIS — Z6839 Body mass index (BMI) 39.0-39.9, adult: Secondary | ICD-10-CM | POA: Insufficient documentation

## 2024-05-28 DIAGNOSIS — Z01818 Encounter for other preprocedural examination: Secondary | ICD-10-CM

## 2024-05-28 DIAGNOSIS — E0590 Thyrotoxicosis, unspecified without thyrotoxic crisis or storm: Secondary | ICD-10-CM | POA: Insufficient documentation

## 2024-05-28 HISTORY — PX: TOTAL KNEE ARTHROPLASTY: SHX125

## 2024-05-28 LAB — GLUCOSE, CAPILLARY
Glucose-Capillary: 170 mg/dL — ABNORMAL HIGH (ref 70–99)
Glucose-Capillary: 185 mg/dL — ABNORMAL HIGH (ref 70–99)

## 2024-05-28 SURGERY — ARTHROPLASTY, KNEE, TOTAL
Anesthesia: Monitor Anesthesia Care | Site: Knee | Laterality: Right

## 2024-05-28 MED ORDER — OXYCODONE HCL 5 MG PO TABS
5.0000 mg | ORAL_TABLET | ORAL | Status: DC | PRN
  Administered 2024-05-28: 10 mg via ORAL
  Administered 2024-05-28: 5 mg via ORAL
  Administered 2024-05-29 (×2): 10 mg via ORAL
  Filled 2024-05-28 (×3): qty 2
  Filled 2024-05-28 (×2): qty 1

## 2024-05-28 MED ORDER — DIPHENHYDRAMINE HCL 12.5 MG/5ML PO ELIX
12.5000 mg | ORAL_SOLUTION | ORAL | Status: DC | PRN
  Administered 2024-05-29: 25 mg via ORAL
  Filled 2024-05-28: qty 10

## 2024-05-28 MED ORDER — LACTATED RINGERS IV SOLN: INTRAVENOUS | Status: DC

## 2024-05-28 MED ORDER — ONDANSETRON HCL 4 MG PO TABS: 4.0000 mg | ORAL_TABLET | Freq: Four times a day (QID) | ORAL | Status: DC | PRN

## 2024-05-28 MED ORDER — ACETAMINOPHEN 10 MG/ML IV SOLN
INTRAVENOUS | Status: AC
  Filled 2024-05-28: qty 100

## 2024-05-28 MED ORDER — MEPERIDINE HCL 25 MG/ML IJ SOLN: 6.2500 mg | INTRAMUSCULAR | Status: DC | PRN

## 2024-05-28 MED ORDER — BUPIVACAINE IN DEXTROSE 0.75-8.25 % IT SOLN
INTRATHECAL | Status: DC | PRN
Start: 2024-05-28 — End: 2024-05-28
  Administered 2024-05-28: 1.8 mL via INTRATHECAL

## 2024-05-28 MED ORDER — ALUM & MAG HYDROXIDE-SIMETH 200-200-20 MG/5ML PO SUSP
30.0000 mL | ORAL | Status: DC | PRN
Start: 2024-05-28 — End: 2024-05-29

## 2024-05-28 MED ORDER — METOCLOPRAMIDE HCL 5 MG PO TABS: 5.0000 mg | ORAL_TABLET | Freq: Three times a day (TID) | ORAL | Status: DC | PRN

## 2024-05-28 MED ORDER — KETOROLAC TROMETHAMINE 30 MG/ML IJ SOLN
INTRAMUSCULAR | Status: AC
Start: 2024-05-28 — End: 2024-05-28
  Filled 2024-05-28: qty 1

## 2024-05-28 MED ORDER — 0.9 % SODIUM CHLORIDE (POUR BTL) OPTIME
TOPICAL | Status: DC | PRN
  Administered 2024-05-28: 1000 mL

## 2024-05-28 MED ORDER — MECLIZINE HCL 25 MG PO TABS: 25.0000 mg | ORAL_TABLET | Freq: Three times a day (TID) | ORAL | Status: DC | PRN

## 2024-05-28 MED ORDER — METFORMIN HCL 500 MG PO TABS
500.0000 mg | ORAL_TABLET | Freq: Every day | ORAL | Status: DC
  Administered 2024-05-29: 500 mg via ORAL
  Filled 2024-05-28: qty 1

## 2024-05-28 MED ORDER — TRANEXAMIC ACID-NACL 1000-0.7 MG/100ML-% IV SOLN
1000.0000 mg | INTRAVENOUS | Status: AC
Start: 2024-05-28 — End: 2024-05-28
  Administered 2024-05-28: 1000 mg via INTRAVENOUS
  Filled 2024-05-28: qty 100

## 2024-05-28 MED ORDER — HYDROMORPHONE HCL 1 MG/ML IJ SOLN
0.5000 mg | INTRAMUSCULAR | Status: DC | PRN
  Administered 2024-05-28: 0.5 mg via INTRAVENOUS
  Filled 2024-05-28: qty 1

## 2024-05-28 MED ORDER — CHLORHEXIDINE GLUCONATE 0.12 % MT SOLN
15.0000 mL | Freq: Once | OROMUCOSAL | Status: AC
  Administered 2024-05-28: 15 mL via OROMUCOSAL

## 2024-05-28 MED ORDER — PHENYLEPHRINE HCL-NACL 20-0.9 MG/250ML-% IV SOLN
INTRAVENOUS | Status: DC | PRN
  Administered 2024-05-28: 30 ug/min via INTRAVENOUS

## 2024-05-28 MED ORDER — POLYETHYLENE GLYCOL 3350 17 G PO PACK
17.0000 g | PACK | Freq: Two times a day (BID) | ORAL | Status: DC
  Administered 2024-05-28 – 2024-05-29 (×2): 17 g via ORAL
  Filled 2024-05-28 (×2): qty 1

## 2024-05-28 MED ORDER — POVIDONE-IODINE 10 % EX SWAB: 2.0000 | Freq: Once | CUTANEOUS | Status: DC

## 2024-05-28 MED ORDER — SODIUM CHLORIDE 0.9 % IR SOLN
Status: DC | PRN
  Administered 2024-05-28: 1000 mL

## 2024-05-28 MED ORDER — PANTOPRAZOLE SODIUM 40 MG PO TBEC
40.0000 mg | DELAYED_RELEASE_TABLET | Freq: Every day | ORAL | Status: DC
  Administered 2024-05-28 – 2024-05-29 (×2): 40 mg via ORAL
  Filled 2024-05-28 (×2): qty 1

## 2024-05-28 MED ORDER — ONDANSETRON HCL 4 MG/2ML IJ SOLN
INTRAMUSCULAR | Status: DC | PRN
  Administered 2024-05-28: 4 mg via INTRAVENOUS

## 2024-05-28 MED ORDER — LOSARTAN POTASSIUM 25 MG PO TABS
25.0000 mg | ORAL_TABLET | Freq: Every day | ORAL | Status: DC
  Administered 2024-05-29: 25 mg via ORAL
  Filled 2024-05-28: qty 1

## 2024-05-28 MED ORDER — DEXAMETHASONE SODIUM PHOSPHATE 10 MG/ML IJ SOLN
8.0000 mg | Freq: Once | INTRAMUSCULAR | Status: AC
  Administered 2024-05-28: 8 mg via INTRAVENOUS

## 2024-05-28 MED ORDER — TRANEXAMIC ACID-NACL 1000-0.7 MG/100ML-% IV SOLN
1000.0000 mg | Freq: Once | INTRAVENOUS | Status: AC
  Administered 2024-05-28: 1000 mg via INTRAVENOUS
  Filled 2024-05-28: qty 100

## 2024-05-28 MED ORDER — PROPOFOL 500 MG/50ML IV EMUL
INTRAVENOUS | Status: DC | PRN
  Administered 2024-05-28: 75 ug/kg/min via INTRAVENOUS
  Administered 2024-05-28: 40 mg via INTRAVENOUS

## 2024-05-28 MED ORDER — FENTANYL CITRATE PF 50 MCG/ML IJ SOSY
50.0000 ug | PREFILLED_SYRINGE | Freq: Once | INTRAMUSCULAR | Status: AC
  Administered 2024-05-28: 50 ug via INTRAVENOUS
  Filled 2024-05-28: qty 2

## 2024-05-28 MED ORDER — OXYCODONE HCL 5 MG/5ML PO SOLN: 5.0000 mg | Freq: Once | ORAL | Status: DC | PRN

## 2024-05-28 MED ORDER — ACETAMINOPHEN 500 MG PO TABS
1000.0000 mg | ORAL_TABLET | Freq: Four times a day (QID) | ORAL | Status: DC
  Administered 2024-05-28 – 2024-05-29 (×3): 1000 mg via ORAL
  Filled 2024-05-28 (×3): qty 2

## 2024-05-28 MED ORDER — SODIUM CHLORIDE (PF) 0.9 % IJ SOLN
INTRAMUSCULAR | Status: AC
Start: 2024-05-28 — End: 2024-05-28
  Filled 2024-05-28: qty 30

## 2024-05-28 MED ORDER — PHENOL 1.4 % MT LIQD: 1.0000 | OROMUCOSAL | Status: DC | PRN

## 2024-05-28 MED ORDER — MIDAZOLAM HCL 2 MG/2ML IJ SOLN
1.0000 mg | Freq: Once | INTRAMUSCULAR | Status: AC
  Administered 2024-05-28: 1 mg via INTRAVENOUS
  Filled 2024-05-28: qty 2

## 2024-05-28 MED ORDER — MONTELUKAST SODIUM 10 MG PO TABS
10.0000 mg | ORAL_TABLET | Freq: Every day | ORAL | Status: DC
Start: 2024-05-28 — End: 2024-05-29
  Administered 2024-05-28: 10 mg via ORAL
  Filled 2024-05-28: qty 1

## 2024-05-28 MED ORDER — OXYCODONE HCL 5 MG PO TABS: 5.0000 mg | ORAL_TABLET | Freq: Once | ORAL | Status: DC | PRN

## 2024-05-28 MED ORDER — BISACODYL 10 MG RE SUPP: 10.0000 mg | Freq: Every day | RECTAL | Status: DC | PRN

## 2024-05-28 MED ORDER — DEXAMETHASONE SODIUM PHOSPHATE 10 MG/ML IJ SOLN
INTRAMUSCULAR | Status: AC
  Filled 2024-05-28: qty 1

## 2024-05-28 MED ORDER — FENTANYL CITRATE PF 50 MCG/ML IJ SOSY: 25.0000 ug | PREFILLED_SYRINGE | INTRAMUSCULAR | Status: DC | PRN

## 2024-05-28 MED ORDER — ONDANSETRON HCL 4 MG/2ML IJ SOLN: 4.0000 mg | Freq: Four times a day (QID) | INTRAMUSCULAR | Status: DC | PRN

## 2024-05-28 MED ORDER — INSULIN ASPART 100 UNIT/ML IJ SOLN
0.0000 [IU] | Freq: Three times a day (TID) | INTRAMUSCULAR | Status: DC
  Administered 2024-05-28: 3 [IU] via SUBCUTANEOUS

## 2024-05-28 MED ORDER — MENTHOL 3 MG MT LOZG: 1.0000 | LOZENGE | OROMUCOSAL | Status: DC | PRN

## 2024-05-28 MED ORDER — CEFAZOLIN SODIUM-DEXTROSE 2-4 GM/100ML-% IV SOLN
2.0000 g | INTRAVENOUS | Status: AC
  Administered 2024-05-28: 2 g via INTRAVENOUS
  Filled 2024-05-28: qty 100

## 2024-05-28 MED ORDER — BUPIVACAINE-EPINEPHRINE (PF) 0.25% -1:200000 IJ SOLN
INTRAMUSCULAR | Status: AC
  Filled 2024-05-28: qty 30

## 2024-05-28 MED ORDER — ACETAMINOPHEN 10 MG/ML IV SOLN
INTRAVENOUS | Status: DC | PRN
Start: 2024-05-28 — End: 2024-05-28
  Administered 2024-05-28: 1000 mg via INTRAVENOUS

## 2024-05-28 MED ORDER — SENNA 8.6 MG PO TABS
2.0000 | ORAL_TABLET | Freq: Every day | ORAL | Status: DC
  Administered 2024-05-28: 17.2 mg via ORAL
  Filled 2024-05-28: qty 2

## 2024-05-28 MED ORDER — BUPIVACAINE-EPINEPHRINE (PF) 0.5% -1:200000 IJ SOLN
INTRAMUSCULAR | Status: DC | PRN
  Administered 2024-05-28: 20 mL via PERINEURAL

## 2024-05-28 MED ORDER — ORAL CARE MOUTH RINSE: 15.0000 mL | Freq: Once | OROMUCOSAL | Status: AC

## 2024-05-28 MED ORDER — CEFAZOLIN SODIUM-DEXTROSE 2-4 GM/100ML-% IV SOLN
2.0000 g | Freq: Four times a day (QID) | INTRAVENOUS | Status: AC
  Administered 2024-05-28 (×2): 2 g via INTRAVENOUS
  Filled 2024-05-28 (×2): qty 100

## 2024-05-28 MED ORDER — FLUTICASONE FUROATE-VILANTEROL 100-25 MCG/ACT IN AEPB: 1.0000 | INHALATION_SPRAY | Freq: Every day | RESPIRATORY_TRACT | Status: DC | PRN

## 2024-05-28 MED ORDER — CYCLOBENZAPRINE HCL 5 MG PO TABS
5.0000 mg | ORAL_TABLET | Freq: Three times a day (TID) | ORAL | Status: DC | PRN
  Administered 2024-05-28: 5 mg via ORAL
  Administered 2024-05-29: 10 mg via ORAL
  Filled 2024-05-28: qty 2
  Filled 2024-05-28: qty 1

## 2024-05-28 MED ORDER — ONDANSETRON HCL 4 MG/2ML IJ SOLN: 4.0000 mg | Freq: Once | INTRAMUSCULAR | Status: DC | PRN

## 2024-05-28 MED ORDER — METOCLOPRAMIDE HCL 5 MG/ML IJ SOLN: 5.0000 mg | Freq: Three times a day (TID) | INTRAMUSCULAR | Status: DC | PRN

## 2024-05-28 MED ORDER — LEVOTHYROXINE SODIUM 125 MCG PO TABS
125.0000 ug | ORAL_TABLET | Freq: Every day | ORAL | Status: DC
  Administered 2024-05-29: 125 ug via ORAL
  Filled 2024-05-28: qty 1

## 2024-05-28 MED ORDER — SODIUM CHLORIDE (PF) 0.9 % IJ SOLN
INTRAMUSCULAR | Status: DC | PRN
  Administered 2024-05-28: 61 mL

## 2024-05-28 MED ORDER — STERILE WATER FOR IRRIGATION IR SOLN
Status: DC | PRN
  Administered 2024-05-28: 2000 mL

## 2024-05-28 MED ORDER — PROPOFOL 1000 MG/100ML IV EMUL
INTRAVENOUS | Status: AC
  Filled 2024-05-28: qty 100

## 2024-05-28 MED ORDER — FLUOXETINE HCL 20 MG PO CAPS
40.0000 mg | ORAL_CAPSULE | Freq: Every day | ORAL | Status: DC
  Administered 2024-05-29: 40 mg via ORAL
  Filled 2024-05-28: qty 2

## 2024-05-28 MED ORDER — ASPIRIN 81 MG PO CHEW
81.0000 mg | CHEWABLE_TABLET | Freq: Two times a day (BID) | ORAL | Status: DC
  Administered 2024-05-28 – 2024-05-29 (×2): 81 mg via ORAL
  Filled 2024-05-28 (×2): qty 1

## 2024-05-28 MED ORDER — BUPROPION HCL ER (XL) 300 MG PO TB24
300.0000 mg | ORAL_TABLET | Freq: Every morning | ORAL | Status: DC
  Administered 2024-05-29: 300 mg via ORAL
  Filled 2024-05-28: qty 1

## 2024-05-28 MED ORDER — OXYCODONE HCL 5 MG PO TABS: 10.0000 mg | ORAL_TABLET | ORAL | Status: DC | PRN

## 2024-05-28 MED ORDER — DEXAMETHASONE SODIUM PHOSPHATE 10 MG/ML IJ SOLN
10.0000 mg | Freq: Once | INTRAMUSCULAR | Status: AC
  Administered 2024-05-29: 10 mg via INTRAVENOUS
  Filled 2024-05-28: qty 1

## 2024-05-28 MED ORDER — CEPHALEXIN 250 MG PO CAPS
250.0000 mg | ORAL_CAPSULE | Freq: Every day | ORAL | Status: DC
  Administered 2024-05-29: 250 mg via ORAL
  Filled 2024-05-28: qty 1

## 2024-05-28 MED ORDER — SODIUM CHLORIDE 0.9 % IV SOLN: INTRAVENOUS | Status: DC

## 2024-05-28 MED ORDER — ONDANSETRON HCL 4 MG/2ML IJ SOLN
INTRAMUSCULAR | Status: AC
  Filled 2024-05-28: qty 2

## 2024-05-28 MED ORDER — ROSUVASTATIN CALCIUM 10 MG PO TABS
10.0000 mg | ORAL_TABLET | Freq: Every day | ORAL | Status: DC
  Administered 2024-05-29: 10 mg via ORAL
  Filled 2024-05-28: qty 1

## 2024-05-28 MED ORDER — FAMOTIDINE 20 MG PO TABS
20.0000 mg | ORAL_TABLET | Freq: Two times a day (BID) | ORAL | Status: DC
Start: 2024-05-28 — End: 2024-05-29
  Administered 2024-05-28 – 2024-05-29 (×2): 20 mg via ORAL
  Filled 2024-05-28 (×2): qty 1

## 2024-05-28 SURGICAL SUPPLY — 47 items
ATTUNE MED ANAT PAT 32 KNEE (Knees) IMPLANT
ATTUNE PSFEM RTSZ4 NARCEM KNEE (Femur) IMPLANT
ATTUNE PSRP INSR SZ4 5 KNEE (Insert) IMPLANT
BAG COUNTER SPONGE SURGICOUNT (BAG) IMPLANT
BAG ZIPLOCK 12X15 (MISCELLANEOUS) ×2 IMPLANT
BLADE SAW SGTL 13.0X1.19X90.0M (BLADE) ×2 IMPLANT
BNDG ELASTIC 6INX 5YD STR LF (GAUZE/BANDAGES/DRESSINGS) ×2 IMPLANT
BNDG ELASTIC 6X10 VLCR STRL LF (GAUZE/BANDAGES/DRESSINGS) IMPLANT
BOWL SMART MIX CTS (DISPOSABLE) ×2 IMPLANT
CEMENT HV SMART SET (Cement) ×4 IMPLANT
COVER SURGICAL LIGHT HANDLE (MISCELLANEOUS) ×2 IMPLANT
CUFF TRNQT CYL 34X4.125X (TOURNIQUET CUFF) ×2 IMPLANT
DERMABOND ADVANCED .7 DNX12 (GAUZE/BANDAGES/DRESSINGS) ×2 IMPLANT
DRAPE U-SHAPE 47X51 STRL (DRAPES) ×2 IMPLANT
DRESSING AQUACEL AG SP 3.5X10 (GAUZE/BANDAGES/DRESSINGS) ×2 IMPLANT
DURAPREP 26ML APPLICATOR (WOUND CARE) ×4 IMPLANT
ELECT REM PT RETURN 15FT ADLT (MISCELLANEOUS) ×2 IMPLANT
GLOVE BIO SURGEON STRL SZ 6 (GLOVE) ×2 IMPLANT
GLOVE BIOGEL PI IND STRL 6.5 (GLOVE) ×2 IMPLANT
GLOVE BIOGEL PI IND STRL 7.5 (GLOVE) ×2 IMPLANT
GLOVE ORTHO TXT STRL SZ7.5 (GLOVE) ×4 IMPLANT
GOWN STRL REUS W/ TWL LRG LVL3 (GOWN DISPOSABLE) ×4 IMPLANT
HOLDER FOLEY CATH W/STRAP (MISCELLANEOUS) IMPLANT
INSERT TIB CMT ATTUNE RP SZ4 (Knees) IMPLANT
KIT TURNOVER KIT A (KITS) ×2 IMPLANT
MANIFOLD NEPTUNE II (INSTRUMENTS) ×2 IMPLANT
NDL SAFETY ECLIPSE 18X1.5 (NEEDLE) IMPLANT
NS IRRIG 1000ML POUR BTL (IV SOLUTION) ×2 IMPLANT
PACK TOTAL KNEE CUSTOM (KITS) ×2 IMPLANT
PENCIL SMOKE EVACUATOR (MISCELLANEOUS) ×2 IMPLANT
PIN FIX SIGMA LCS THRD HI (PIN) IMPLANT
PROTECTOR NERVE ULNAR (MISCELLANEOUS) ×2 IMPLANT
SET HNDPC FAN SPRY TIP SCT (DISPOSABLE) ×2 IMPLANT
SET PAD KNEE POSITIONER (MISCELLANEOUS) ×2 IMPLANT
SLEEVE KNEE ATTUNE 29MM (Knees) IMPLANT
SPIKE FLUID TRANSFER (MISCELLANEOUS) ×4 IMPLANT
STEM REV ATTUNE 14X30 KNEE (Orthopedic Implant) IMPLANT
SUT MNCRL AB 4-0 PS2 18 (SUTURE) ×2 IMPLANT
SUT STRATAFIX PDS+ 0 24IN (SUTURE) ×2 IMPLANT
SUT VIC AB 1 CT1 36 (SUTURE) ×2 IMPLANT
SUT VIC AB 2-0 CT1 TAPERPNT 27 (SUTURE) ×4 IMPLANT
SYR 3ML LL SCALE MARK (SYRINGE) ×2 IMPLANT
TOWEL GREEN STERILE FF (TOWEL DISPOSABLE) ×2 IMPLANT
TRAY FOLEY MTR SLVR 14FR STAT (SET/KITS/TRAYS/PACK) IMPLANT
TUBE SUCTION HIGH CAP CLEAR NV (SUCTIONS) ×2 IMPLANT
WATER STERILE IRR 1000ML POUR (IV SOLUTION) ×4 IMPLANT
WRAP KNEE MAXI GEL POST OP (GAUZE/BANDAGES/DRESSINGS) ×2 IMPLANT

## 2024-05-28 NOTE — Anesthesia Postprocedure Evaluation (Signed)
 Anesthesia Post Note  Patient: Melissa [name]  Procedure(s) Performed: ARTHROPLASTY, KNEE, TOTAL (Right: Knee)     Patient location during evaluation: PACU Anesthesia Type: Regional, MAC and Spinal Level of consciousness: oriented and awake and alert Pain management: pain level controlled Vital Signs Assessment: post-procedure vital signs reviewed and stable Respiratory status: spontaneous breathing, respiratory function stable and patient connected to nasal cannula oxygen Cardiovascular status: blood pressure returned to baseline and stable Postop Assessment: no headache, no backache and no apparent nausea or vomiting Anesthetic complications: no   No notable events documented.  Last Vitals:  Vitals:   05/28/24 1215 05/28/24 1315  BP: 137/80 127/67  Pulse: 70 71  Resp: 17 16  Temp:  36.6 C  SpO2: 95% 100%    Last Pain:  Vitals:   05/28/24 1315  TempSrc: Oral  PainSc:                  Shadonna Benedick

## 2024-05-28 NOTE — Brief Op Note (Signed)
 05/28/2024  10:57 AM  PATIENT:  Rosina JAYSON Batty  58 y.o. female  PRE-OPERATIVE DIAGNOSIS:  Right knee osteoarthritis, failed partial knee arthroplasty  POST-OPERATIVE DIAGNOSIS:  Right knee osteoarthritis, failed partial knee arthroplasty  PROCEDURE:  Procedure(s) with comments: ARTHROPLASTY, KNEE, TOTAL (Right) - Conversion of right partial knee arthroplasty to right total knee arthroplasty  SURGEON:  Surgeons and Role:    DEWAINE Ernie Cough, MD - Primary  PHYSICIAN ASSISTANT: Rosina Calin, PA-C   ANESTHESIA:   regional and spinal  EBL:  100 mL   BLOOD ADMINISTERED:none  DRAINS: none   LOCAL MEDICATIONS USED:  MARCAINE      SPECIMEN:  No Specimen  DISPOSITION OF SPECIMEN:  N/A  COUNTS:  YES  TOURNIQUET:   Total Tourniquet Time Documented: Thigh (Right) - -42 minutes Total: Thigh (Right) - -42 minutes   DICTATION: .Other Dictation: Dictation Number [id number]  PLAN OF CARE: Admit for overnight observation  PATIENT DISPOSITION:  PACU - hemodynamically stable.   Delay start of Pharmacological VTE agent (>24hrs) due to surgical blood loss or risk of bleeding: no

## 2024-05-28 NOTE — Anesthesia Procedure Notes (Signed)
 Anesthesia Regional Block: Adductor canal block   Pre-Anesthetic Checklist: , timeout performed,  Correct Patient, Correct Site, Correct Laterality,  Correct Procedure, Correct Position, site marked,  Risks and benefits discussed,  Surgical consent,  Pre-op evaluation,  At surgeon's request and post-op pain management  Laterality: Right  Prep: chloraprep       Needles:  Injection technique: Single-shot  Needle Type: Echogenic Stimulator Needle     Needle Length: 5cm  Needle Gauge: 22     Additional Needles:   Procedures:,,,, ultrasound used (permanent image in chart),,    Narrative:  Start time: 05/28/2024 8:50 AM End time: 05/28/2024 8:53 AM Injection made incrementally with aspirations every 5 mL.  Performed by: Personally  Anesthesiologist: Mallory Manus, MD  Additional Notes: Functioning IV was confirmed and monitors were applied.  A 50mm 22ga Arrow echogenic stimulator needle was used. Sterile prep and drape,hand hygiene and sterile gloves were used. Ultrasound guidance: relevant anatomy identified, needle position confirmed, local anesthetic spread visualized around nerve(s)., vascular puncture avoided.  Image printed for medical record. Negative aspiration and negative test dose prior to incremental administration of local anesthetic. The patient tolerated the procedure well.

## 2024-05-28 NOTE — Op Note (Signed)
 NAME: Melissa [name], BUCH MEDICAL RECORD NO: [id number] ACCOUNT NO: [id number] DATE OF BIRTH: [birth year] FACILITY: [facility] LOCATION: [facility] PHYSICIAN: [name], MD  Operative Report   DATE OF PROCEDURE: 05/28/2024  PREOPERATIVE DIAGNOSIS:  Failed right partial knee arthroplasty with progression of osteoarthritis involving the lateral and patellofemoral compartments.  POSTOPERATIVE DIAGNOSIS:  Failed right partial knee arthroplasty with progression of osteoarthritis involving the lateral and patellofemoral compartments.  PROCEDURE:  Conversion of failed right partial knee arthroplasty to right total knee replacement.  COMPONENTS USED:  Depuy Attune knee system with a size 4 narrow femur, a size 4 revision tibial tray with a 30 mm x 14 mm cemented stem and a 29 Press-Fit sleeve.  SURGEON:  [name], MD  ASSISTANT:  ROSINA Calin, PA-C.  Note that PA Calin was present for the entirety of the case from preoperative position and preoperative management of the operative extremity, general facilitation of the case and primary wound closure.  ANESTHESIA:  Regional plus spinal block.  DRAINS: None.  COMPLICATIONS: None.  TOURNIQUET:  Up for 42 minutes at 225 mmHg.  INDICATIONS FOR THE PROCEDURE:  The patient is a 58 year old pleasant female with a history of right partial knee arthroplasty.  We have been following this over the past couple of years with radiographic and clinical presentation for progression of  osteoarthritis.  At her last radiographic assessment, she has had advanced progression of the lateral and patellofemoral compartment with complete loss of joint space and periarticular osteophytes.  There was no obvious loosening of her medial-based  implants.  Based on her progressive pain and failure to respond to conservative treatment, we discussed converting to total knee replacement.  Risks including infection, DVT, component failure, stiffness, and need for future  surgeries were discussed and  reviewed.  The postoperative course and effort required to maximize her functional return were reviewed and stressed.  Consent was obtained for the benefit of pain relief.  DESCRIPTION OF PROCEDURE:  The patient was brought to the operative theater.  Once adequate anesthesia, preoperative antibiotics, Ancef  administered, she was positioned supine with a right thigh tourniquet placed.  The right lower extremity was then  prepped and draped in sterile fashion.  Timeout was performed identifying the patient's planned procedure and extremity.  Her old incision was identified and extended proximal and distal for this procedure.  Soft tissue planes were created.  A median  arthrotomy was made.  We encountered clear synovial fluid without signs of inflammation or infection.  Following initial exposure including synovectomy, we first directed attention to the patella.  Precut measurement of the patella was noted to be about  21 mm.  We resected down to 13 to 14 mm and a 32 patella button to restore height.  Lug holes were drilled and a metal shim placed to protect the patella from retractors and saw blades throughout the case.  Attention was now directed to the femur.  The  femoral canal was opened with a drill, irrigated to prevent fat emboli.  At 3 degrees of valgus, I resected 9 mm off the distal femur.  In doing so, we were at the level of her medial femoral component.  I used an osteotome and was able to remove the  femoral component without significant bone loss.  At this point, I revisited this cut on the distal aspect medially and laterally.  There was no significant bone loss noted on the femoral side.  At this point, the tibia was subluxated anteriorly.  I used  an extramedullary guide and set it to the level beneath the tibial tray.  The tibial tray was removed using an osteotome.  Again, no obvious loosening, but it was removed without significant bone loss.  I then using  an extramedullary guide, made a  proximal tibial cut at this level.  Once this was done, I checked the extension gap and found that there was going to be probably about at least a 10-mm same spacer.  Given this, we now attended to the femur.  I measured and sized the femur to be a size  4.  The rotation block was pinned into place based off the proximal tibia cut.  The anterior, posterior, and chamfer cuts were all made without difficulty or complications.  Final box cut was made off the lateral aspect of the distal femur.  At this  point, I determined I would be using a narrow component.  The tibia was then subluxated anteriorly.  Based on the extension and flexion assessment, we decided at this point to use a revision tibial tray with a sleeve to restore the tibial height.  The  proximal tibia was prepared for a sleeve with initial drill and reaming and then broaching.  We did a trial reduction with the broach in place noting that it was sitting proud by 5 to 6 mm.  With this, we found that the size 5 insert provided stability  in extension and flexion with the patella tracking through the trochlea without application of pressure.  Given all these findings, all the trial components were removed.  The knee was irrigated with normal saline solution.  The posterior capsule of the  knee was injected with 0.25% Marcaine  with epinephrine  and 1 mL of Toradol  and saline.  I drilled for any sclerotic bone to allow for cement interdigitation.  The knee was irrigated with normal saline solution with pulse lavage.  Final components were  configured on the back table.  The final components were then cemented into place, particularly the femur.  We did place some cement around the stem of the tibial component and impacted the press-fit sleeve to the level where the broach sat.  Based on  this, the size 5 insert was placed in the knee and the knee was brought to extension to allow the cement to fully cure.  Once the  cement fully cured, excessive cement was removed, we elected to use the size 5 insert.  The 5 insert was placed.  The knee  was re-irrigated.  The tourniquet had been let down after 42 minutes without significant hemostasis.  The extensor mechanism was then reapproximated using #1 Vicryl and #1 Stratafix suture.  The remainder of the wound was closed in layers with 2-0 Vicryl  and running Monocryl stitch.  The wound was clean, dry, and dressed sterilely using surgical glue and Aquacel dressing.  She was then brought to the recovery room in stable condition, tolerating the procedure well.   PUS D: 05/28/2024 11:05:49 am T: 05/28/2024 12:00:00 pm  JOB: [id number]/ [id number]

## 2024-05-28 NOTE — Anesthesia Procedure Notes (Deleted)
 Spinal  Staffing Performed by: Bethena Midget, MD Authorized by: Bethena Midget, MD

## 2024-05-28 NOTE — Transfer of Care (Signed)
 Immediate Anesthesia Transfer of Care Note  Patient: Melissa [name]  Procedure(s) Performed: ARTHROPLASTY, KNEE, TOTAL (Right: Knee)  Patient Location: PACU  Anesthesia Type:MAC  Level of Consciousness: awake, alert , and oriented  Airway & Oxygen Therapy: Patient Spontanous Breathing and Patient connected to face mask oxygen  Post-op Assessment: Report given to RN and Post -op Vital signs reviewed and stable  Post vital signs: Reviewed and stable  Last Vitals:  Vitals Value Taken Time  BP 106/61 05/28/24 11:23  Temp    Pulse 74 05/28/24 11:25  Resp 11 05/28/24 11:25  SpO2 100 % 05/28/24 11:25  Vitals shown include unfiled device data.  Last Pain:  Vitals:   05/28/24 0900  TempSrc:   PainSc: 0-No pain         Complications: No notable events documented.

## 2024-05-28 NOTE — Discharge Instructions (Signed)

## 2024-05-28 NOTE — Anesthesia Procedure Notes (Addendum)
 Spinal  Patient location during procedure: OR Start time: 05/28/2024 9:39 AM End time: 05/28/2024 9:42 AM Reason for block: surgical anesthesia Staffing Anesthesiologist: Mallory Manus, MD Performed by: Mallory Manus, MD Authorized by: Mallory Manus, MD   Preanesthetic Checklist Completed: patient identified, IV checked, site marked, risks and benefits discussed, surgical consent, monitors and equipment checked, pre-op evaluation and timeout performed Spinal Block Patient position: sitting Prep: DuraPrep Patient monitoring: heart rate, cardiac monitor, continuous pulse ox and blood pressure Approach: midline Location: L3-4 Injection technique: single-shot Needle Needle type: Sprotte  Needle gauge: 24 G Needle length: 9 cm Assessment Sensory level: T4 Events: CSF return

## 2024-05-28 NOTE — Interval H&P Note (Signed)
 History and Physical Interval Note:  05/28/2024 8:33 AM  Melissa [name]  has presented today for surgery, with the diagnosis of Right knee osteoarthritis, failed partial knee arthroplasty.  The various methods of treatment have been discussed with the patient and family. After consideration of risks, benefits and other options for treatment, the patient has consented to  Procedure(s) with comments: ARTHROPLASTY, KNEE, TOTAL (Right) - Conversion of right partial knee arthroplasty to right total knee arthroplasty as a surgical intervention.  The patient's history has been reviewed, patient examined, no change in status, stable for surgery.  I have reviewed the patient's chart and labs.  Questions were answered to the patient's satisfaction.     [name]

## 2024-05-28 NOTE — Plan of Care (Signed)

## 2024-05-28 NOTE — Evaluation (Signed)
 Physical Therapy Evaluation Patient Details Name: Melissa [name] MRN: [id number] DOB: 1966/03/23 Today's Date: 05/28/2024  History of Present Illness  Pt is a 58 y.o. female s/p  conversion Partion knee to [facility] 05/28/24. PMH:R reverse revision lateralized total shoulder arthroplasty, arthritis, fatty liver, GERD, HTN, hypothyroidism, OSA, osteoarthritis, pre-diabetes, tinnitis, vertigo.anxiety, depression, uni knee replacement  Clinical Impression  The patient ambulated x 20' with RW.  Patient should progress to return home with friends assisting. Pt admitted with above diagnosis.  Pt currently with functional limitations due to the deficits listed below (see PT Problem List). Pt will benefit from acute skilled PT to increase their independence and safety with mobility to allow discharge.           If plan is discharge home, recommend the following: A little help with walking and/or transfers;A little help with bathing/dressing/bathroom;Assistance with cooking/housework;Help with stairs or ramp for entrance   Can travel by private vehicle        Equipment Recommendations Rolling walker (2 wheels) (youth)  Recommendations for Other Services       Functional Status Assessment Patient has had a recent decline in their functional status and demonstrates the ability to make significant improvements in function in a reasonable and predictable amount of time.     Precautions / Restrictions Precautions Precautions: Knee;Fall Restrictions Weight Bearing Restrictions Per Provider Order: No      Mobility  Bed Mobility Overal bed mobility: Needs Assistance Bed Mobility: Supine to Sit     Supine to sit: Min assist     General bed mobility comments: support right leg to floor    Transfers Overall transfer level: Needs assistance Equipment used: Rolling walker (2 wheels) Transfers: Sit to/from Stand Sit to Stand: Min assist           General transfer comment: cues for hand and  right leg position    Ambulation/Gait Ambulation/Gait assistance: Min assist Gait Distance (Feet): 20 Feet Assistive device: Rolling walker (2 wheels) Gait Pattern/deviations: Step-to pattern, Step-through pattern, Antalgic Gait velocity: decr     General Gait Details: patient  tolerated short distance, has not had medication so lmited with  reports pain startin up in the right knee.  Stairs            Wheelchair Mobility     Tilt Bed    Modified Rankin (Stroke Patients Only)       Balance Overall balance assessment: Mild deficits observed, not formally tested                                           Pertinent Vitals/Pain Pain Assessment Pain Assessment: 0-10 Pain Score: 4  Pain Location: stingin Right knee Pain Descriptors / Indicators: Tender, Grimacing Pain Intervention(s): Monitored during session, Patient requesting pain meds-RN notified, Ice applied    Home Living Family/patient expects to be discharged to:: Private residence Living Arrangements: Non-relatives/Friends Available Help at Discharge: Friend(s);Available 24 hours/day Type of Home: House Home Access: Stairs to enter Entrance Stairs-Rails: Doctor, general practice of Steps: 4   Home Layout: One level Home Equipment: Cane - single point Additional Comments: has multiple friends to help    Prior Function Prior Level of Function : Independent/Modified Independent;Driving             Mobility Comments: intermittent cane based on terrian       Extremity/Trunk Assessment  Upper Extremity Assessment Upper Extremity Assessment: Overall WFL for tasks assessed    Lower Extremity Assessment Lower Extremity Assessment: RLE deficits/detail RLE Deficits / Details: + SLR. Knee flex 5-60    Cervical / Trunk Assessment Cervical / Trunk Assessment: Normal  Communication   Communication Communication: No apparent difficulties    Cognition Arousal:  Alert Behavior During Therapy: WFL for tasks assessed/performed   PT - Cognitive impairments: No apparent impairments                         Following commands: Intact       Cueing Cueing Techniques: Verbal cues     General Comments      Exercises Total Joint Exercises Ankle Circles/Pumps: AROM, Both, 10 reps Quad Sets: AROM, Both, 10 reps Heel Slides: AROM, Right, 5 reps   Assessment/Plan    PT Assessment Patient needs continued PT services  PT Problem List Decreased strength;Decreased cognition;Decreased range of motion;Decreased knowledge of use of DME;Decreased activity tolerance;Decreased knowledge of precautions;Decreased mobility;Pain       PT Treatment Interventions DME instruction;Gait training;Stair training;Patient/family education;Functional mobility training;Therapeutic activities;Therapeutic exercise    PT Goals (Current goals can be found in the Care Plan section)  Acute Rehab PT Goals Patient Stated Goal: get left kne surg PT Goal Formulation: With patient Time For Goal Achievement: 06/04/24 Potential to Achieve Goals: Good    Frequency 7X/week     Co-evaluation               AM-PAC PT 6 Clicks Mobility  Outcome Measure Help needed turning from your back to your side while in a flat bed without using bedrails?: A Little Help needed moving from lying on your back to sitting on the side of a flat bed without using bedrails?: A Little Help needed moving to and from a bed to a chair (including a wheelchair)?: A Little Help needed standing up from a chair using your arms (e.g., wheelchair or bedside chair)?: A Little Help needed to walk in hospital room?: A Little Help needed climbing 3-5 steps with a railing? : A Lot 6 Click Score: 17    End of Session Equipment Utilized During Treatment: Gait belt Activity Tolerance: Patient tolerated treatment well Patient left: in bed;with call bell/phone within reach Nurse Communication:  Mobility status;Patient requests pain meds PT Visit Diagnosis: Unsteadiness on feet (R26.81);Muscle weakness (generalized) (M62.81);Pain Pain - Right/Left: Right Pain - part of body: Knee    Time: 8381-8353 PT Time Calculation (min) (ACUTE ONLY): 28 min   Charges:   PT Evaluation $PT Eval Low Complexity: 1 Low PT Treatments $Gait Training: 8-22 mins PT General Charges $$ ACUTE PT VISIT: 1 Visit         Darice Potters PT Acute Rehabilitation Services Office [phone]   Potters Darice Norris 05/28/2024, 5:07 PM

## 2024-05-29 ENCOUNTER — Encounter (HOSPITAL_COMMUNITY): Payer: Self-pay | Admitting: Orthopedic Surgery

## 2024-05-29 DIAGNOSIS — T8484XA Pain due to internal orthopedic prosthetic devices, implants and grafts, initial encounter: Secondary | ICD-10-CM | POA: Diagnosis not present

## 2024-05-29 LAB — CBC
HCT: 31.6 % — ABNORMAL LOW (ref 36.0–46.0)
Hemoglobin: 9.4 g/dL — ABNORMAL LOW (ref 12.0–15.0)
MCH: 26.5 pg (ref 26.0–34.0)
MCHC: 29.7 g/dL — ABNORMAL LOW (ref 30.0–36.0)
MCV: 89 fL (ref 80.0–100.0)
Platelets: 319 K/uL (ref 150–400)
RBC: 3.55 MIL/uL — ABNORMAL LOW (ref 3.87–5.11)
RDW: 14.8 % (ref 11.5–15.5)
WBC: 11.3 K/uL — ABNORMAL HIGH (ref 4.0–10.5)
nRBC: 0 % (ref 0.0–0.2)

## 2024-05-29 LAB — BASIC METABOLIC PANEL WITH GFR
Anion gap: 11 (ref 5–15)
BUN: 18 mg/dL (ref 6–20)
CO2: 23 mmol/L (ref 22–32)
Calcium: 9.2 mg/dL (ref 8.9–10.3)
Chloride: 106 mmol/L (ref 98–111)
Creatinine, Ser: 0.75 mg/dL (ref 0.44–1.00)
GFR, Estimated: >60 mL/min (ref >60)
Glucose, Bld: 130 mg/dL — ABNORMAL HIGH (ref 70–99)
Potassium: 4.4 mmol/L (ref 3.5–5.1)
Sodium: 140 mmol/L (ref 135–145)

## 2024-05-29 LAB — GLUCOSE, CAPILLARY
Glucose-Capillary: 98 mg/dL (ref 70–99)
Glucose-Capillary: 131 mg/dL — ABNORMAL HIGH (ref 70–99)

## 2024-05-29 MED ORDER — SENNA 8.6 MG PO TABS: 2.0000 | ORAL_TABLET | Freq: Every day | ORAL | 0 refills | Status: AC

## 2024-05-29 MED ORDER — OXYCODONE HCL 5 MG PO TABS: 5.0000 mg | ORAL_TABLET | ORAL | 0 refills | Status: DC | PRN

## 2024-05-29 MED ORDER — ASPIRIN 81 MG PO CHEW: 81.0000 mg | CHEWABLE_TABLET | Freq: Two times a day (BID) | ORAL | 0 refills | Status: AC

## 2024-05-29 MED ORDER — CYCLOBENZAPRINE HCL 5 MG PO TABS: 5.0000 mg | ORAL_TABLET | Freq: Three times a day (TID) | ORAL | 1 refills | Status: DC | PRN

## 2024-05-29 MED ORDER — POLYETHYLENE GLYCOL 3350 17 G PO PACK: 17.0000 g | PACK | Freq: Two times a day (BID) | ORAL | Status: DC

## 2024-05-29 NOTE — Progress Notes (Signed)
 Reviewed written d/c instructions w pt and all questions answered, she verbalized understanding. D/C via w/c w all belongings in stable condition.

## 2024-05-29 NOTE — Progress Notes (Signed)
 Physical Therapy Treatment Patient Details Name: Melissa [name] MRN: [id number] DOB: [birth year] Today's Date: 05/29/2024   History of Present Illness Pt is a 58 y.o. female s/p  conversion Partion knee to [facility] 05/28/24. PMH:R reverse revision lateralized total shoulder arthroplasty, arthritis, fatty liver, GERD, HTN, hypothyroidism, OSA, osteoarthritis, pre-diabetes, tinnitis, vertigo.anxiety, depression, uni knee replacement    PT Comments   Patient has met goals for Dc.    If plan is discharge home, recommend the following: A little help with walking and/or transfers;A little help with bathing/dressing/bathroom;Assistance with cooking/housework;Help with stairs or ramp for entrance   Can travel by private vehicle        Equipment Recommendations  Rolling walker (2 wheels)    Recommendations for Other Services       Precautions / Restrictions Precautions Precautions: Knee;Fall Restrictions Weight Bearing Restrictions Per Provider Order: No     Mobility  Bed Mobility Overal bed mobility: Modified Independent Bed Mobility: Supine to Sit     Supine to sit: Modified independent (Device/Increase time)          Transfers Overall transfer level: Modified independent Equipment used: Rolling walker (2 wheels)   Sit to Stand: Supervision           General transfer comment: cues for hand and right leg position    Ambulation/Gait Ambulation/Gait assistance: Supervision Gait Distance (Feet): 200 Feet Assistive device: Rolling walker (2 wheels) Gait Pattern/deviations: Step-to pattern, Step-through pattern, Antalgic       General Gait Details: gait steady and  smoothe   Stairs Stairs: Yes Stairs assistance: Contact guard assist Stair Management: One rail Right, Step to pattern, With cane, Forwards Number of Stairs: 5 General stair comments: cues for sequence, steady no buckling   Wheelchair Mobility     Tilt Bed    Modified Rankin (Stroke Patients  Only)       Balance Overall balance assessment: Mild deficits observed, not formally tested                                          Communication    Cognition Arousal: Alert Behavior During Therapy: WFL for tasks assessed/performed                                    Cueing    Exercises   General Comments        Pertinent Vitals/Pain Pain Assessment Pain Score: 3  Pain Location: Right knee Pain Descriptors / Indicators: Tender, Grimacing Pain Intervention(s): Monitored during session, Ice applied    Home Living                          Prior Function            PT Goals (current goals can now be found in the care plan section) Progress towards PT goals: Progressing toward goals    Frequency    7X/week      PT Plan      Co-evaluation              AM-PAC PT 6 Clicks Mobility   Outcome Measure  Help needed turning from your back to your side while in a flat bed without using bedrails?: None Help needed moving from lying on your back to sitting on  the side of a flat bed without using bedrails?: None Help needed moving to and from a bed to a chair (including a wheelchair)?: None Help needed standing up from a chair using your arms (e.g., wheelchair or bedside chair)?: A Little Help needed to walk in hospital room?: A Little Help needed climbing 3-5 steps with a railing? : A Little 6 Click Score: 21    End of Session Equipment Utilized During Treatment: Gait belt Activity Tolerance: Patient tolerated treatment well Patient left: in bed;with call bell/phone within reach;with family/visitor present Nurse Communication: Mobility status PT Visit Diagnosis: Unsteadiness on feet (R26.81);Muscle weakness (generalized) (M62.81);Pain Pain - Right/Left: Right Pain - part of body: Knee     Time: 8860-8850 PT Time Calculation (min) (ACUTE ONLY): 10 min  Charges:    $Gait Training: 8-22 mins PT General  Charges $$ ACUTE PT VISIT: 1 Visit                     Darice Potters PT Acute Rehabilitation Services Office [phone]   Potters Darice Norris 05/29/2024, 3:25 PM

## 2024-05-29 NOTE — Progress Notes (Signed)
 Physical Therapy Treatment Patient Details Name: Melissa [name] MRN: [id number] DOB: Feb 28, 1966 Today's Date: 05/29/2024   History of Present Illness Pt is a 58 y.o. female s/p  conversion Partion knee to [facility] 05/28/24. PMH:R reverse revision lateralized total shoulder arthroplasty, arthritis, fatty liver, GERD, HTN, hypothyroidism, OSA, osteoarthritis, pre-diabetes, tinnitis, vertigo.anxiety, depression, uni knee replacement    PT Comments  Patient progressing well, with see again for gait  and review then anticipate ready for Dc.    If plan is discharge home, recommend the following: A little help with walking and/or transfers;A little help with bathing/dressing/bathroom;Assistance with cooking/housework;Help with stairs or ramp for entrance   Can travel by private vehicle        Equipment Recommendations  Rolling walker (2 wheels)    Recommendations for Other Services       Precautions / Restrictions Precautions Precautions: None Restrictions Weight Bearing Restrictions Per Provider Order: No     Mobility  Bed Mobility   Bed Mobility: Supine to Sit     Supine to sit: Modified independent (Device/Increase time)          Transfers Overall transfer level: Needs assistance Equipment used: Rolling walker (2 wheels)   Sit to Stand: Supervision           General transfer comment: cues for hand and right leg position    Ambulation/Gait Ambulation/Gait assistance: Supervision Gait Distance (Feet): 120 Feet Assistive device: Rolling walker (2 wheels) Gait Pattern/deviations: Step-to pattern, Step-through pattern, Antalgic       General Gait Details: gait steady and  smoothe   Stairs Stairs: Yes Stairs assistance: Contact guard assist Stair Management: One rail Right, Step to pattern, With cane, Forwards Number of Stairs: 5 General stair comments: cues for sequence, steady no buckling   Wheelchair Mobility     Tilt Bed    Modified Rankin (Stroke  Patients Only)       Balance Overall balance assessment: Mild deficits observed, not formally tested                                          Communication    Cognition Arousal: Alert Behavior During Therapy: WFL for tasks assessed/performed                                    Cueing    Exercises Total Joint Exercises Ankle Circles/Pumps: AROM Quad Sets: AROM, Both Heel Slides: AROM, 10 reps, Right Hip ABduction/ADduction: AROM, 10 reps, Right Straight Leg Raises: AROM, 10 reps, Right Long Arc Quad: AROM, Right, 10 reps, Seated Knee Flexion: AROM, Right, Seated, 10 reps Goniometric ROM: 5-60 right knee    General Comments        Pertinent Vitals/Pain Pain Assessment Pain Score: 3  Pain Location: Right knee Pain Descriptors / Indicators: Tender, Grimacing Pain Intervention(s): Premedicated before session    Home Living                          Prior Function            PT Goals (current goals can now be found in the care plan section) Progress towards PT goals: Progressing toward goals    Frequency    7X/week      PT Plan  Co-evaluation              AM-PAC PT 6 Clicks Mobility   Outcome Measure  Help needed turning from your back to your side while in a flat bed without using bedrails?: None Help needed moving from lying on your back to sitting on the side of a flat bed without using bedrails?: None Help needed moving to and from a bed to a chair (including a wheelchair)?: A Little Help needed standing up from a chair using your arms (e.g., wheelchair or bedside chair)?: A Little Help needed to walk in hospital room?: A Little Help needed climbing 3-5 steps with a railing? : A Little 6 Click Score: 20    End of Session Equipment Utilized During Treatment: Gait belt Activity Tolerance: Patient tolerated treatment well Patient left: in bed;with call bell/phone within reach Nurse Communication:  Mobility status;Patient requests pain meds PT Visit Diagnosis: Unsteadiness on feet (R26.81);Muscle weakness (generalized) (M62.81);Pain Pain - Right/Left: Right Pain - part of body: Knee     Time: 9163-9092 PT Time Calculation (min) (ACUTE ONLY): 31 min  Charges:    $Gait Training: 8-22 mins $Therapeutic Exercise: 8-22 mins PT General Charges $$ ACUTE PT VISIT: 1 Visit                    Darice Potters PT Acute Rehabilitation Services Office [phone]    Potters Darice Norris 05/29/2024, 3:21 PM

## 2024-05-29 NOTE — Progress Notes (Signed)
 Subjective: 1 Day Post-Op Procedure(s) (LRB): ARTHROPLASTY, KNEE, TOTAL (Right) Patient reports pain as mild.   Patient seen in rounds with Dr. Ernie. Patient is well, and has had no acute complaints or problems. No acute events overnight. Foley catheter removed. Patient ambulated 20 feet with PT.  We will start therapy today.   Objective: Vital signs in last 24 hours: Temp:  [97.9 F (36.6 C)-98.2 F (36.8 C)] 97.9 F (36.6 C) (08/06 0651) Pulse Rate:  [70-84] 76 (08/06 0651) Resp:  [12-20] 16 (08/06 0651) BP: (102-145)/(53-91) 102/91 (08/06 0651) SpO2:  [95 %-100 %] 100 % (08/06 0651) Weight:  [91.2 kg] 91.2 kg (08/05 0800)  Intake/Output from previous day:  Intake/Output Summary (Last 24 hours) at 05/29/2024 0731 Last data filed at 05/29/2024 9376 Gross per 24 hour  Intake 3388.83 ml  Output 2600 ml  Net 788.83 ml     Intake/Output this shift: No intake/output data recorded.  Labs: Recent Labs    05/29/24 0333  HGB 9.4*   Recent Labs    05/29/24 0333  WBC 11.3*  RBC 3.55*  HCT 31.6*  PLT 319   Recent Labs    05/29/24 0333  NA 140  K 4.4  CL 106  CO2 23  BUN 18  CREATININE 0.75  GLUCOSE 130*  CALCIUM  9.2   No results for input(s): LABPT, INR in the last 72 hours.  Exam: General - Patient is Alert and Oriented Extremity - Neurologically intact Sensation intact distally Intact pulses distally Dorsiflexion/Plantar flexion intact Dressing - dressing C/D/I Motor Function - intact, moving foot and toes well on exam.   Past Medical History:  Diagnosis Date   Allergic rhinitis    Anxiety    Arthritis    Back pain    Constipated    Depression    Diaphragmatic hernia    Fatty liver    GERD (gastroesophageal reflux disease)    History of kidney stones    Hypertension    Hypothyroidism    IBS (irritable bowel syndrome)    Joint pain    Kidney problem    one functioning kidney due to left renal atrophy   Lactose intolerance    Morbid  obesity (HCC)    OSA (obstructive sleep apnea)    Osteoarthritis    Pre-diabetes    Seasonal allergies    Tinnitus    Vertigo    Vitamin D  deficiency     Assessment/Plan: 1 Day Post-Op Procedure(s) (LRB): ARTHROPLASTY, KNEE, TOTAL (Right) Principal Problem:   S/P total knee arthroplasty, right  Estimated body mass index is 36.76 kg/m as calculated from the following:   Height as of this encounter: 5' 2 (1.575 m).   Weight as of this encounter: 91.2 kg. Advance diet Up with therapy D/C IV fluids   Patient's anticipated LOS is less than 2 midnights, meeting these requirements: - Younger than 37 - Lives within 1 hour of care - Has a competent adult at home to recover with post-op recover - NO history of  - Chronic pain requiring opiods  - Diabetes  - Coronary Artery Disease  - Heart failure  - Heart attack  - Stroke  - DVT/VTE  - Cardiac arrhythmia  - Respiratory Failure/COPD  - Renal failure  - Anemia  - Advanced Liver disease     DVT Prophylaxis - Aspirin  Weight bearing as tolerated.  Hgb stable at 9.4 this AM  Plan is to go Home after hospital stay. Plan for discharge today following  1-2 sessions of PT as long as they are meeting their goals. Patient is scheduled for OPPT. Follow up in the office in 2 weeks.   Rosina Calin, PA-C Orthopedic Surgery [phone] 05/29/2024, 7:31 AM

## 2024-05-29 NOTE — Plan of Care (Signed)
  Problem: Education: Goal: Knowledge of General Education information will improve Description: Including pain rating scale, medication(s)/side effects and non-pharmacologic comfort measures Outcome: Adequate for Discharge   Problem: Health Behavior/Discharge Planning: Goal: Ability to manage health-related needs will improve Outcome: Adequate for Discharge   Problem: Clinical Measurements: Goal: Ability to maintain clinical measurements within normal limits will improve Outcome: Adequate for Discharge Goal: Will remain free from infection Outcome: Adequate for Discharge Goal: Diagnostic test results will improve Outcome: Adequate for Discharge Goal: Respiratory complications will improve Outcome: Adequate for Discharge Goal: Cardiovascular complication will be avoided Outcome: Adequate for Discharge   Problem: Activity: Goal: Risk for activity intolerance will decrease Outcome: Adequate for Discharge   Problem: Nutrition: Goal: Adequate nutrition will be maintained Outcome: Adequate for Discharge   Problem: Coping: Goal: Level of anxiety will decrease Outcome: Adequate for Discharge   Problem: Elimination: Goal: Will not experience complications related to bowel motility Outcome: Adequate for Discharge Goal: Will not experience complications related to urinary retention Outcome: Adequate for Discharge   Problem: Pain Managment: Goal: General experience of comfort will improve and/or be controlled Outcome: Adequate for Discharge   Problem: Safety: Goal: Ability to remain free from injury will improve Outcome: Adequate for Discharge   Problem: Skin Integrity: Goal: Risk for impaired skin integrity will decrease Outcome: Adequate for Discharge   Problem: Education: Goal: Ability to describe self-care measures that may prevent or decrease complications (Diabetes Survival Skills Education) will improve Outcome: Adequate for Discharge Goal: Individualized Educational  Video(s) Outcome: Adequate for Discharge   Problem: Coping: Goal: Ability to adjust to condition or change in health will improve Outcome: Adequate for Discharge   Problem: Fluid Volume: Goal: Ability to maintain a balanced intake and output will improve Outcome: Adequate for Discharge   Problem: Health Behavior/Discharge Planning: Goal: Ability to identify and utilize available resources and services will improve Outcome: Adequate for Discharge Goal: Ability to manage health-related needs will improve Outcome: Adequate for Discharge   Problem: Metabolic: Goal: Ability to maintain appropriate glucose levels will improve Outcome: Adequate for Discharge   Problem: Nutritional: Goal: Maintenance of adequate nutrition will improve Outcome: Adequate for Discharge Goal: Progress toward achieving an optimal weight will improve Outcome: Adequate for Discharge   Problem: Skin Integrity: Goal: Risk for impaired skin integrity will decrease Outcome: Adequate for Discharge   Problem: Tissue Perfusion: Goal: Adequacy of tissue perfusion will improve Outcome: Adequate for Discharge   Problem: Education: Goal: Knowledge of the prescribed therapeutic regimen will improve Outcome: Adequate for Discharge Goal: Individualized Educational Video(s) Outcome: Adequate for Discharge   Problem: Activity: Goal: Ability to avoid complications of mobility impairment will improve Outcome: Adequate for Discharge Goal: Range of joint motion will improve Outcome: Adequate for Discharge   Problem: Clinical Measurements: Goal: Postoperative complications will be avoided or minimized Outcome: Adequate for Discharge   Problem: Pain Management: Goal: Pain level will decrease with appropriate interventions Outcome: Adequate for Discharge   Problem: Skin Integrity: Goal: Will show signs of wound healing Outcome: Adequate for Discharge

## 2024-05-31 NOTE — Discharge Summary (Signed)
 Patient ID: Melissa [name] MRN: [id number] DOB/AGE: 04/01/1966 58 y.o.  Admit date: 05/28/2024 Discharge date: 05/29/2024  Admission Diagnoses:  Failed right partial knee replacement  Discharge Diagnoses:  Principal Problem:   S/P total knee arthroplasty, right   Past Medical History:  Diagnosis Date   Allergic rhinitis    Anxiety    Arthritis    Back pain    Constipated    Depression    Diaphragmatic hernia    Fatty liver    GERD (gastroesophageal reflux disease)    History of kidney stones    Hypertension    Hypothyroidism    IBS (irritable bowel syndrome)    Joint pain    Kidney problem    one functioning kidney due to left renal atrophy   Lactose intolerance    Morbid obesity (HCC)    OSA (obstructive sleep apnea)    Osteoarthritis    Pre-diabetes    Seasonal allergies    Tinnitus    Vertigo    Vitamin D  deficiency     Surgeries: Procedure(s): ARTHROPLASTY, KNEE, TOTAL on 05/28/2024   Consultants:   Discharged Condition: Improved  Hospital Course: SHAYANA HORNSTEIN is an 58 y.o. female who was admitted 05/28/2024 for operative treatment ofS/P total knee arthroplasty, right. Patient has severe unremitting pain that affects sleep, daily activities, and work/hobbies. After pre-op clearance the patient was taken to the operating room on 05/28/2024 and underwent  Procedure(s): ARTHROPLASTY, KNEE, TOTAL.    Patient was given perioperative antibiotics:  Anti-infectives (From admission, onward)    Start     Dose/Rate Route Frequency Ordered Stop   05/29/24 1000  cephALEXin  (KEFLEX ) capsule 250 mg  Status:  Discontinued        250 mg Oral Daily 05/28/24 1324 05/29/24 1751   05/28/24 1545  ceFAZolin  (ANCEF ) IVPB 2g/100 mL premix        2 g 200 mL/hr over 30 Minutes Intravenous Every 6 hours 05/28/24 1324 05/28/24 2251   05/28/24 0730  ceFAZolin  (ANCEF ) IVPB 2g/100 mL premix        2 g 200 mL/hr over 30 Minutes Intravenous On call to O.R. 05/28/24 0720 05/28/24 9057         Patient was given sequential compression devices, early ambulation, and chemoprophylaxis to prevent DVT. Patient worked with PT and was meeting their goals regarding safe ambulation and transfers.  Patient benefited maximally from hospital stay and there were no complications.    Recent vital signs: No data found.   Recent laboratory studies:  Recent Labs    05/29/24 0333  WBC 11.3*  HGB 9.4*  HCT 31.6*  PLT 319  NA 140  K 4.4  CL 106  CO2 23  BUN 18  CREATININE 0.75  GLUCOSE 130*  CALCIUM  9.2     Discharge Medications:   Allergies as of 05/29/2024       Reactions   Bee Venom Hives   Lactose Intolerance (gi) Other (See Comments)        Medication List     TAKE these medications    acetaminophen  500 MG tablet Commonly known as: TYLENOL  Take 2 tablets (1,000 mg total) by mouth every 6 (six) hours as needed (pain). What changed:  how much to take when to take this   aspirin  81 MG chewable tablet Chew 1 tablet (81 mg total) by mouth 2 (two) times daily for 28 days. What changed: when to take this   Breyna 80-4.5 MCG/ACT inhaler Generic drug: budesonide-formoterol   Inhale 2 puffs into the lungs daily as needed (shortness of breath).   buPROPion  300 MG 24 hr tablet Commonly known as: WELLBUTRIN  XL Take 300 mg by mouth every morning.   cephALEXin  250 MG capsule Commonly known as: KEFLEX  Take 250 mg by mouth daily.   chlorpheniramine 4 MG tablet Commonly known as: CHLOR-TRIMETON Take 4 mg by mouth in the morning.   [employer] EYE ALLERGY RELIEF OP Place 1 drop into both eyes daily as needed (allergies).   cyclobenzaprine  5 MG tablet Commonly known as: FLEXERIL  Take 1-2 tablets (5-10 mg total) by mouth 3 (three) times daily as needed for muscle spasms.   diphenhydrAMINE  25 mg capsule Commonly known as: BENADRYL  Take 1 capsule (25 mg total) by mouth every 6 (six) hours as needed for itching, allergies or sleep. What changed:  how much to  take when to take this   EPINEPHrine  0.3 mg/0.3 mL Soaj injection Commonly known as: EPI-PEN Inject 0.3 mg into the muscle as needed for anaphylaxis.   famotidine  20 MG tablet Commonly known as: PEPCID  Take 20 mg by mouth 2 (two) times daily.   FLUoxetine  40 MG capsule Commonly known as: PROZAC  Take 40 mg by mouth daily.   levothyroxine  125 MCG tablet Commonly known as: SYNTHROID  Take 125 mcg by mouth daily before breakfast.   losartan  25 MG tablet Commonly known as: COZAAR  Take 25 mg by mouth daily.   meclizine  25 MG tablet Commonly known as: ANTIVERT  Take 25 mg by mouth 3 (three) times daily as needed for dizziness.   metFORMIN  500 MG tablet Commonly known as: GLUCOPHAGE  1 tab at lunch 1 tab at dinner What changed:  how much to take how to take this when to take this additional instructions   montelukast  10 MG tablet Commonly known as: SINGULAIR  Take 10 mg by mouth at bedtime.   omeprazole 40 MG capsule Commonly known as: PRILOSEC Take 40 mg by mouth at bedtime.   oxyCODONE  5 MG immediate release tablet Commonly known as: Oxy IR/ROXICODONE  Take 1 tablet (5 mg total) by mouth every 4 (four) hours as needed for severe pain (pain score 7-10).   polyethylene glycol 17 g packet Commonly known as: MIRALAX  / GLYCOLAX  Take 17 g by mouth 2 (two) times daily.   rosuvastatin  10 MG tablet Commonly known as: CRESTOR  Take 10 mg by mouth.   senna 8.6 MG Tabs tablet Commonly known as: SENOKOT Take 2 tablets (17.2 mg total) by mouth at bedtime for 14 days.   triamcinolone  cream 0.5 % Commonly known as: KENALOG  Apply 1 Application topically daily as needed (itching).   vitamin C 1000 MG tablet Take 1,000 mg by mouth daily.   VITAMIN D  PO Take 2,000 Units by mouth daily.   Zepbound  7.5 MG/0.5ML Pen Generic drug: tirzepatide  Inject 7.5 mg into the skin once a week.               Discharge Care Instructions  (From admission, onward)            Start     Ordered   05/29/24 0000  Change dressing       Comments: Maintain surgical dressing until follow up in the clinic. If the edges start to pull up, may reinforce with tape. If the dressing is no longer working, may remove and cover with gauze and tape, but must keep the area dry and clean.  Call with any questions or concerns.   05/29/24 9345  Diagnostic Studies: No results found.  Disposition: Discharge disposition: 01-Home or Self Care       Discharge Instructions     Call MD / Call 911   Complete by: As directed    If you experience chest pain or shortness of breath, CALL 911 and be transported to the hospital emergency room.  If you develope a fever above 101 F, pus (white drainage) or increased drainage or redness at the wound, or calf pain, call your surgeon's office.   Change dressing   Complete by: As directed    Maintain surgical dressing until follow up in the clinic. If the edges start to pull up, may reinforce with tape. If the dressing is no longer working, may remove and cover with gauze and tape, but must keep the area dry and clean.  Call with any questions or concerns.   Constipation Prevention   Complete by: As directed    Drink plenty of fluids.  Prune juice may be helpful.  You may use a stool softener, such as Colace (over the counter) 100 mg twice a day.  Use MiraLax  (over the counter) for constipation as needed.   Diet - low sodium heart healthy   Complete by: As directed    Increase activity slowly as tolerated   Complete by: As directed    Weight bearing as tolerated with assist device (walker, cane, etc) as directed, use it as long as suggested by your surgeon or therapist, typically at least 4-6 weeks.   Post-operative opioid taper instructions:   Complete by: As directed    POST-OPERATIVE OPIOID TAPER INSTRUCTIONS: It is important to wean off of your opioid medication as soon as possible. If you do not need pain medication after  your surgery it is ok to stop day one. Opioids include: Codeine , Hydrocodone (Norco, Vicodin), Oxycodone (Percocet, oxycontin ) and hydromorphone  amongst others.  Long term and even short term use of opiods can cause: Increased pain response Dependence Constipation Depression Respiratory depression And more.  Withdrawal symptoms can include Flu like symptoms Nausea, vomiting And more Techniques to manage these symptoms Hydrate well Eat regular healthy meals Stay active Use relaxation techniques(deep breathing, meditating, yoga) Do Not substitute Alcohol to help with tapering If you have been on opioids for less than two weeks and do not have pain than it is ok to stop all together.  Plan to wean off of opioids This plan should start within one week post op of your joint replacement. Maintain the same interval or time between taking each dose and first decrease the dose.  Cut the total daily intake of opioids by one tablet each day Next start to increase the time between doses. The last dose that should be eliminated is the evening dose.      TED hose   Complete by: As directed    Use stockings (TED hose) for 2 weeks on both leg(s).  You may remove them at night for sleeping.        Follow-up Information     Ernie Cough, MD. Schedule an appointment as soon as possible for a visit in 2 week(s).   Specialty: Orthopedic Surgery Contact information: [address] [place] [address] [phone]                  Signed: TIAMARIE FURNARI 05/31/2024, 7:20 AM

## 2024-06-05 ENCOUNTER — Encounter (INDEPENDENT_AMBULATORY_CARE_PROVIDER_SITE_OTHER): Payer: Self-pay | Admitting: Adult Health

## 2024-06-05 NOTE — Telephone Encounter (Signed)
 Called pt to inform her that we will refill her Rx at the next office visit. Pt verbalized understanding.

## 2024-06-05 NOTE — Telephone Encounter (Signed)
 Medication was sent to the pharmacy

## 2024-06-18 ENCOUNTER — Ambulatory Visit (INDEPENDENT_AMBULATORY_CARE_PROVIDER_SITE_OTHER): Admitting: Adult Health

## 2024-06-18 ENCOUNTER — Encounter (INDEPENDENT_AMBULATORY_CARE_PROVIDER_SITE_OTHER): Payer: Self-pay | Admitting: Adult Health

## 2024-06-18 VITALS — BP 130/77 | HR 83 | Temp 98.2°F | Ht 62.0 in | Wt 203.0 lb

## 2024-06-18 DIAGNOSIS — I10 Essential (primary) hypertension: Secondary | ICD-10-CM

## 2024-06-18 DIAGNOSIS — Z6837 Body mass index (BMI) 37.0-37.9, adult: Secondary | ICD-10-CM

## 2024-06-18 DIAGNOSIS — E669 Obesity, unspecified: Secondary | ICD-10-CM

## 2024-06-18 DIAGNOSIS — K219 Gastro-esophageal reflux disease without esophagitis: Secondary | ICD-10-CM

## 2024-06-18 DIAGNOSIS — E66813 Obesity, class 3: Secondary | ICD-10-CM

## 2024-06-18 DIAGNOSIS — R7303 Prediabetes: Secondary | ICD-10-CM

## 2024-06-18 MED ORDER — ZEPBOUND 5 MG/0.5ML ~~LOC~~ SOAJ: 5.0000 mg | SUBCUTANEOUS | 0 refills | Status: DC

## 2024-06-18 MED ORDER — METFORMIN HCL 500 MG PO TABS: ORAL_TABLET | ORAL | 0 refills | Status: DC

## 2024-06-18 MED ORDER — TIRZEPATIDE-WEIGHT MANAGEMENT 5 MG/0.5ML ~~LOC~~ SOLN: 5.0000 mg | SUBCUTANEOUS | 0 refills | Status: DC

## 2024-06-18 MED ORDER — FAMOTIDINE 20 MG PO TABS: 20.0000 mg | ORAL_TABLET | Freq: Two times a day (BID) | ORAL | 1 refills | Status: DC

## 2024-06-18 NOTE — Progress Notes (Signed)
 WEIGHT SUMMARY AND BIOMETRICS  Vitals Temp: 98.2 F (36.8 C) BP: 130/77 Pulse Rate: 83 SpO2: 99 %   Anthropometric Measurements Height: 5' 2 (1.575 m) Weight: 203 lb (92.1 kg) BMI (Calculated): 37.12 Weight at Last Visit: 201 lb Weight Lost Since Last Visit: 0 Weight Gained Since Last Visit: 2 lb Starting Weight: 256 lb Total Weight Loss (lbs): 53 lb (24 kg)   Body Composition  Body Fat %: 49.3 % Fat Mass (lbs): 100.2 lbs Muscle Mass (lbs): 98 lbs Total Body Water  (lbs): 78.8 lbs Visceral Fat Rating : 15   Other Clinical Data Fasting: yes Labs: no Today's Visit #: 79 Starting Date: 11/18/19    Chief Complaint:   OBESITY Melissa [name] is here to discuss her progress with her obesity treatment plan.  She is on the the Category 3 Plan and states she is following her eating plan approximately 75 % of the time.  She states she is exercising daily Walking.  Interim History:  Admit date: 05/28/2024 Discharge date: 05/29/2024   Admission Diagnoses:  Failed right partial knee replacement   Discharge Diagnoses:  Principal Problem:   S/P total knee arthroplasty, right  She restarted weekly Zepbound  5mg - tolerating well  07/09/2024 she is scheduled for L TKR She will take Zepbound  5mg  on 06/23/24- then hold until after procedure  She has local friends to assist with her next recovery  Subjective:   1. Hypertension, unspecified type BP slightly elevated at OV She denies CP with exertion Home readings: SBP: 120s DBP: 60-70 She is on  losartan  (COZAAR ) 25 MG tablet  rosuvastatin  (CRESTOR ) 10 MG tablet  aspirin  81 MG chewable tablet   2. Pre-diabetes Lab Results  Component Value Date   HGBA1C 5.5 12/11/2023   HGBA1C 5.9 (H) 07/13/2023   HGBA1C 5.8 (H) 01/04/2023    Started Zepbound  2.5mg  on/about 11/09/2023  She titrated up to Zepbound  7.5mg , then decreased back to 5mg  after her last Orthopedic procedure (S/P total knee arthroplasty, right on  05/28/2024) Denies mass in neck, dysphagia, dyspepsia, persistent hoarseness, abdominal pain, or N/V/C   3. Gastroesophageal reflux disease, unspecified whether esophagitis present She reports acid reflux sx's well controlled with Pepcid  20mg  BID She has not been followed by Gastroenterology for years  Assessment/Plan:   1. Hypertension, unspecified type Limit Na+ intake Continue losartan  (COZAAR ) 25 MG tablet  rosuvastatin  (CRESTOR ) 10 MG tablet  aspirin  81 MG chewable tablet   2. Pre-diabetes Refill - metFORMIN  (GLUCOPHAGE ) 500 MG tablet; 1 tab at lunch 1 tab at dinner  Dispense: 180 tablet; Refill: 0  3. Gastroesophageal reflux disease, unspecified whether esophagitis present Refill  famotidine  (PEPCID ) 20 MG tablet Take 1 tablet (20 mg total) by mouth 2 (two) times daily. Dispense: 60 tablet, Refills: 1 ordered   Discuss continuing with GI at next OV  4. Obesity, current BMI 37.2 (Primary) Refill  tirzepatide  (ZEPBOUND ) 5 MG/0.5ML Pen Inject 5 mg into the skin once a week. Dispense: 3 mL, Refills: 0 ordered   Etana is currently in the action stage of change. As such, her goal is to continue with weight loss efforts. She has agreed to the Category 3 Plan.   Exercise goals: All adults should avoid inactivity. Some physical activity is better than none, and adults who participate in any amount of physical activity gain some health benefits. Adults should also include muscle-strengthening activities that involve all major muscle groups on 2 or more days a week. Daily Walking  Behavioral modification strategies: increasing  lean protein intake, decreasing simple carbohydrates, increasing vegetables, increasing water  intake, no skipping meals, meal planning and cooking strategies, keeping healthy foods in the home, ways to avoid boredom eating, and planning for success.  Mykiah has agreed to follow-up with our clinic in 4 weeks. She was informed of the importance of frequent  follow-up visits to maximize her success with intensive lifestyle modifications for her multiple health conditions.   Objective:   Blood pressure 130/77, pulse 83, temperature 98.2 F (36.8 C), height 5' 2 (1.575 m), weight 203 lb (92.1 kg), last menstrual period 10/12/2012, SpO2 99%. Body mass index is 37.13 kg/m.  General: Cooperative, alert, well developed, in no acute distress. HEENT: Conjunctivae and lids unremarkable. Cardiovascular: Regular rhythm.  Lungs: Normal work of breathing. Neurologic: No focal deficits.   Lab Results  Component Value Date   CREATININE 0.75 05/29/2024   BUN 18 05/29/2024   NA 140 05/29/2024   K 4.4 05/29/2024   CL 106 05/29/2024   CO2 23 05/29/2024   Lab Results  Component Value Date   ALT 23 04/09/2024   AST 30 04/09/2024   ALKPHOS 158 (H) 04/09/2024   BILITOT 0.7 04/09/2024   Lab Results  Component Value Date   HGBA1C 5.5 12/11/2023   HGBA1C 5.9 (H) 07/13/2023   HGBA1C 5.8 (H) 01/04/2023   HGBA1C 5.7 (H) 05/05/2022   HGBA1C 5.7 (H) 01/11/2022   Lab Results  Component Value Date   INSULIN  9.2 12/11/2023   INSULIN  14.1 07/13/2023   INSULIN  14.0 01/04/2023   INSULIN  10.6 05/05/2022   INSULIN  8.3 01/11/2022   Lab Results  Component Value Date   TSH 1.580 12/01/2021   Lab Results  Component Value Date   CHOL 207 (H) 01/11/2022   HDL 86 01/11/2022   LDLCALC 109 (H) 01/11/2022   TRIG 69 01/11/2022   CHOLHDL 2.4 01/11/2022   Lab Results  Component Value Date   VD25OH 66.5 12/11/2023   VD25OH 47.9 07/13/2023   VD25OH 57.1 01/04/2023   Lab Results  Component Value Date   WBC 11.3 (H) 05/29/2024   HGB 9.4 (L) 05/29/2024   HCT 31.6 (L) 05/29/2024   MCV 89.0 05/29/2024   PLT 319 05/29/2024   Lab Results  Component Value Date   IRON 26 (L) 04/14/2024   TIBC 245 (L) 04/14/2024   FERRITIN 509 (H) 04/14/2024   Attestation Statements:   Reviewed by clinician on day of visit: allergies, medications, problem list, medical  history, surgical history, family history, social history, and previous encounter notes.  I have reviewed the above documentation for accuracy and completeness, and I agree with the above. -  Masaki Rothbauer d. Therisa Mennella, NP-C

## 2024-07-01 NOTE — Progress Notes (Signed)
 COVID Vaccine received:  []  No [x]  Yes Date of any COVID positive Test in last 90 days: Yes Dx 06-21-24  PCP - Ozell Reilly, MD, Robbi Brinks, PA-C at [facility] [phone] (Work) [phone] (Fax)  Cardiologist - none  Chest x-ray -  EKG -  05-16-2024 Stress Test -  ECHO -  Cardiac Cath -  CT Coronary Calcium  score:   Pacemaker / ICD device [x]  No []  Yes   Spinal Cord Stimulator:[x]  No []  Yes       History of Sleep Apnea? []  No [x]  Yes   CPAP used?- [x]  No []  Yes    Patient has: []  NO Hx DM   [x]  Pre-DM   []  DM1  []   DM2 Does the patient monitor blood sugar?   []  N/A   [x]  No []  Yes  Last A1c was: 5.5  on   12-11-2023   ZEPBOUND -  Stopped on 06-23-2024 per patient  METFORMIN -   Hold DOS  Blood Thinner / Instructions:    None Aspirin  Instructions:   ASA 81 mg  Patient told to continue d/t having recent COVID dx 06-21-24  ERAS Protocol Ordered: []  No  [x]  Yes PRE-SURGERY []  ENSURE  [x]  G2  Patient is to be NPO after: 0930  Dental hx: []  Dentures:  []  N/A      []  Bridge or Partial:                   [x]  Loose or Damaged teeth:   Comments: Patient was given the 5 CHG shower / bath instructions for TKA surgery along with 2 bottles of the CHG soap. Patient will start this on:      Friday 07-05-2024         Activity level: Able to walk up 2 flights of stairs without becoming significantly short of breath or having chest pain?  []  No   [x]    Yes  Patient can perform ADLs without assistance. []  No   [x]   Yes  Anesthesia review: HTN, Pre-DM, fatty liver, GERD, anxiety, OSA- No CPAP, anemia, only 1 functioning kidney- has Left renal atrophy.   Patient denies any S&S of respiratory illness or Covid - no shortness of breath, fever, cough or chest pain at PAT appointment.  Patient verbalized understanding and agreement to the Pre-Surgical Instructions that were given to them at this PAT appointment. Patient was also educated of the need to review these PAT instructions  again prior to her surgery.I reviewed the appropriate phone numbers to call if they have any and questions or concerns.

## 2024-07-02 ENCOUNTER — Other Ambulatory Visit: Payer: Self-pay

## 2024-07-02 ENCOUNTER — Encounter (HOSPITAL_COMMUNITY)
Admission: RE | Admit: 2024-07-02 | Discharge: 2024-07-02 | Disposition: A | Discharge status: Home / Self Care | Source: Ambulatory Visit | Attending: Orthopedic Surgery | Admitting: Orthopedic Surgery

## 2024-07-02 ENCOUNTER — Encounter (HOSPITAL_COMMUNITY): Payer: Self-pay

## 2024-07-02 VITALS — BP 140/76 | HR 85 | Temp 97.9°F | Resp 22 | Ht 62.0 in | Wt 204.1 lb

## 2024-07-02 DIAGNOSIS — K760 Fatty (change of) liver, not elsewhere classified: Secondary | ICD-10-CM | POA: Insufficient documentation

## 2024-07-02 DIAGNOSIS — M1712 Unilateral primary osteoarthritis, left knee: Secondary | ICD-10-CM | POA: Insufficient documentation

## 2024-07-02 DIAGNOSIS — N289 Disorder of kidney and ureter, unspecified: Secondary | ICD-10-CM | POA: Diagnosis not present

## 2024-07-02 DIAGNOSIS — R7303 Prediabetes: Secondary | ICD-10-CM | POA: Diagnosis not present

## 2024-07-02 DIAGNOSIS — Z01812 Encounter for preprocedural laboratory examination: Secondary | ICD-10-CM | POA: Insufficient documentation

## 2024-07-02 DIAGNOSIS — Z01818 Encounter for other preprocedural examination: Secondary | ICD-10-CM

## 2024-07-02 HISTORY — DX: Bronchitis, not specified as acute or chronic: J40

## 2024-07-02 HISTORY — DX: Anemia, unspecified: D64.9

## 2024-07-02 HISTORY — DX: Personal history of other diseases of the digestive system: Z87.19

## 2024-07-02 LAB — COMPREHENSIVE METABOLIC PANEL WITH GFR
ALT: 8 U/L (ref 0–44)
AST: 15 U/L (ref 15–41)
Albumin: 4.3 g/dL (ref 3.5–5.0)
Alkaline Phosphatase: 127 U/L — ABNORMAL HIGH (ref 38–126)
Anion gap: 13 (ref 5–15)
BUN: 13 mg/dL (ref 6–20)
CO2: 24 mmol/L (ref 22–32)
Calcium: 10.1 mg/dL (ref 8.9–10.3)
Chloride: 104 mmol/L (ref 98–111)
Creatinine, Ser: 0.69 mg/dL (ref 0.44–1.00)
GFR, Estimated: >60 mL/min (ref >60)
Glucose, Bld: 120 mg/dL — ABNORMAL HIGH (ref 70–99)
Potassium: 4.8 mmol/L (ref 3.5–5.1)
Sodium: 141 mmol/L (ref 135–145)
Total Bilirubin: <0.2 mg/dL (ref 0.0–1.2)
Total Protein: 7.2 g/dL (ref 6.5–8.1)

## 2024-07-02 LAB — CBC
HCT: 36.5 % (ref 36.0–46.0)
Hemoglobin: 10.8 g/dL — ABNORMAL LOW (ref 12.0–15.0)
MCH: 25.8 pg — ABNORMAL LOW (ref 26.0–34.0)
MCHC: 29.6 g/dL — ABNORMAL LOW (ref 30.0–36.0)
MCV: 87.1 fL (ref 80.0–100.0)
Platelets: 535 K/uL — ABNORMAL HIGH (ref 150–400)
RBC: 4.19 MIL/uL (ref 3.87–5.11)
RDW: 14.6 % (ref 11.5–15.5)
WBC: 7.3 K/uL (ref 4.0–10.5)
nRBC: 0 % (ref 0.0–0.2)

## 2024-07-02 LAB — SURGICAL PCR SCREEN
MRSA, PCR: NEGATIVE
Staphylococcus aureus: NEGATIVE

## 2024-07-02 NOTE — Patient Instructions (Addendum)
 SURGICAL WAITING ROOM VISITATION Patients having surgery or a procedure may have no more than 2 support people in the waiting area - these visitors may rotate in the visitor waiting room.   If the patient needs to stay at the hospital during part of their recovery, the visitor guidelines for inpatient rooms apply.  PRE-OP VISITATION  Pre-op nurse will coordinate an appropriate time for 1 support person to accompany the patient in pre-op.  This support person may not rotate.  This visitor will be contacted when the time is appropriate for the visitor to come back in the pre-op area.  Please refer to the [facility] website for the visitor guidelines for Inpatients (after your surgery is over and you are in a regular room).  You are not required to quarantine at this time prior to your surgery. However, you must do this: Hand Hygiene often Do NOT share personal items Notify your provider if you are in close contact with someone who has COVID or you develop fever 100.4 or greater, new onset of sneezing, cough, sore throat, shortness of breath or body aches.  If you test positive for Covid or have been in contact with anyone that has tested positive in the last 10 days please notify you surgeon.    Your procedure is scheduled on:  Tuesday July 09, 2024  Report to [facility] Main Entrance: [name] entrance where the [employer] is available.   Report to admitting at:  10:00   AM  Call this number if you have any questions or problems the morning of surgery [phone]  Do not eat food after Midnight the night prior to your surgery/procedure.  After Midnight you may have the following liquids until   09:30 AM / PM DAY OF SURGERY  Clear Liquid Diet Water  Black Coffee (sugar ok, NO MILK/CREAM OR CREAMERS)  Tea (sugar ok, NO MILK/CREAM OR CREAMERS) regular and decaf                             Plain Jell-O  with no fruit (NO RED)                                            Fruit ices (not with fruit pulp, NO RED)                                     Popsicles (NO RED)                                                                  Juice: NO CITRUS JUICES: only apple, WHITE grape, WHITE cranberry Sports drinks like Gatorade or Powerade (NO RED)                   The day of surgery:  Drink ONE (1) Pre-Surgery G2 at   09:30 AM the morning of surgery. Drink in one sitting. Do not sip.  This drink was given to you during your hospital pre-op appointment visit. Nothing else to drink  after completing the Pre-Surgery G2 : No candy, chewing gum or throat lozenges.    FOLLOW ANY ADDITIONAL PRE OP INSTRUCTIONS YOU RECEIVED FROM YOUR SURGEON'S OFFICE!!!   Oral Hygiene is also important to reduce your risk of infection.        Remember - BRUSH YOUR TEETH THE MORNING OF SURGERY WITH YOUR REGULAR TOOTHPASTE  Do NOT smoke after Midnight the night before surgery.  STOP TAKING all Vitamins, Herbs and supplements 1 week before your surgery.   Take ONLY these medicines the morning of surgery with A SIP OF WATER : Famotidine , Levothyroxine , Fluoxetine , Bupropion . You may take Tylenol  OR Oxycodone  IR if needed for pain.   DO NOT TAKE LOSARTAN  the morning of surgery.   If You have been diagnosed with Sleep Apnea - Bring CPAP mask and tubing day of surgery. We will provide you with a CPAP machine on the day of your surgery.                   You may not have any metal on your body including hair pins, jewelry, and body piercing  Do not wear make-up, lotions, powders, perfumes / cologne, or deodorant  Do not wear nail polish including gel and S&S, artificial / acrylic nails, or any other type of covering on natural nails including finger and toenails. If you have artificial nails, gel coating, etc., that needs to be removed by a nail salon, Please have this removed prior to surgery. Not doing so may mean that your surgery could be cancelled or delayed if the Surgeon or  anesthesia staff feels like they are unable to monitor you safely.   Do not shave 48 hours prior to surgery to avoid nicks in your skin which may contribute to postoperative infections.    Contacts, Hearing Aids, dentures or bridgework may not be worn into surgery. DENTURES WILL BE REMOVED PRIOR TO SURGERY PLEASE DO NOT APPLY Poly grip OR ADHESIVES!!!  You may bring a small overnight bag with you on the day of surgery, only pack items that are not valuable. [name] IS NOT RESPONSIBLE   FOR VALUABLES THAT ARE LOST OR STOLEN.   Do not bring your home medications to the hospital. The Pharmacy will dispense medications listed on your medication list to you during your admission in the Hospital.  Please read over the following fact sheets you were given: IF YOU HAVE QUESTIONS ABOUT YOUR PRE-OP INSTRUCTIONS, PLEASE CALL [phone].     Pre-operative 5 CHG Bath Instructions   You can play a key role in reducing the risk of infection after surgery. Your skin needs to be as free of germs as possible. You can reduce the number of germs on your skin by washing with CHG (chlorhexidine  gluconate) soap before surgery. CHG is an antiseptic soap that kills germs and continues to kill germs even after washing.   DO NOT use if you have an allergy to chlorhexidine /CHG or antibacterial soaps. If your skin becomes reddened or irritated, stop using the CHG and notify one of our RNs at [phone]  Please shower with the CHG soap starting 4 days before surgery using the following schedule: START SHOWERS ON   FRIDAY  July 05, 2024  Please keep in mind the following:  DO NOT shave, including legs and underarms, starting the day of your first shower.   You may shave your face at any point before/day of surgery.   Place clean  sheets on your bed the day you start using CHG soap. Use a clean washcloth (not used since being washed) for each shower. DO NOT sleep with pets once you start using the CHG.   CHG Shower Instructions:  If you choose to wash your hair and private area, wash first with your normal shampoo/soap.  After you use shampoo/soap, rinse your hair and body thoroughly to remove shampoo/soap residue.  Turn the water  OFF and apply about 3 tablespoons (45 ml) of CHG soap to a CLEAN washcloth.  Apply CHG soap ONLY FROM YOUR NECK DOWN TO YOUR TOES (washing for 3-5 minutes)  DO NOT use CHG soap on face, private areas, open wounds, or sores.  Pay special attention to the area where your surgery is being performed.  If you are having back surgery, having someone wash your back for you may be helpful.  Wait 2 minutes after CHG soap is applied, then you may rinse off the CHG soap.  Pat dry with a clean towel  Put on clean clothes/pajamas   If you choose to wear lotion, please use ONLY the CHG-compatible lotions on the back of this paper.     Additional instructions for the day of surgery: DO NOT APPLY any lotions, deodorants, cologne, or perfumes.   Put on clean/comfortable clothes.  Brush your teeth.  Ask your nurse before applying any prescription medications to the skin.      CHG Compatible Lotions   Aveeno Moisturizing lotion  Cetaphil Moisturizing Cream  Cetaphil Moisturizing Lotion  Clairol Herbal Essence Moisturizing Lotion, Dry Skin  Clairol Herbal Essence Moisturizing Lotion, Extra Dry Skin  Clairol Herbal Essence Moisturizing Lotion, Normal Skin  Curel Age Defying Therapeutic Moisturizing Lotion with Alpha Hydroxy  Curel Extreme Care Body Lotion  Curel Soothing Hands Moisturizing Hand Lotion  Curel Therapeutic Moisturizing Cream, Fragrance-Free  Curel Therapeutic Moisturizing Lotion, Fragrance-Free  Curel Therapeutic Moisturizing Lotion, Original Formula  Eucerin Daily Replenishing  Lotion  Eucerin Dry Skin Therapy Plus Alpha Hydroxy Crme  Eucerin Dry Skin Therapy Plus Alpha Hydroxy Lotion  Eucerin Original Crme  Eucerin Original Lotion  Eucerin Plus Crme Eucerin Plus Lotion  Eucerin TriLipid Replenishing Lotion  Keri Anti-Bacterial Hand Lotion  Keri Deep Conditioning Original Lotion Dry Skin Formula Softly Scented  Keri Deep Conditioning Original Lotion, Fragrance Free Sensitive Skin Formula  Keri Lotion Fast Absorbing Fragrance Free Sensitive Skin Formula  Keri Lotion Fast Absorbing Softly Scented Dry Skin Formula  Keri Original Lotion  Keri Skin Renewal Lotion Keri Silky Smooth Lotion  Keri Silky Smooth Sensitive Skin Lotion  Nivea Body Creamy Conditioning Oil  Nivea Body Extra Enriched Lotion  Nivea Body Original Lotion  Nivea Body Sheer Moisturizing Lotion Nivea Crme  Nivea Skin Firming Lotion  NutraDerm 30 Skin Lotion  NutraDerm Skin Lotion  NutraDerm Therapeutic Skin Cream  NutraDerm Therapeutic Skin Lotion  ProShield Protective Hand Cream  Provon moisturizing lotion   FAILURE TO FOLLOW THESE INSTRUCTIONS MAY RESULT IN THE CANCELLATION OF YOUR SURGERY  PATIENT SIGNATURE_________________________________  NURSE SIGNATURE__________________________________  ________________________________________________________________________        Melissa [name]    An incentive spirometer is a tool that can help keep your lungs clear and active. This tool measures how well you are filling your lungs with each breath.  Taking long deep breaths may help reverse or decrease the chance of developing breathing (pulmonary) problems (especially infection) following: A long period of time when you are unable to move or be active. BEFORE THE PROCEDURE  If the spirometer includes an indicator to show your best effort, your nurse or respiratory therapist will set it to a desired goal. If possible, sit up straight or lean slightly forward. Try not to  slouch. Hold the incentive spirometer in an upright position. INSTRUCTIONS FOR USE  Sit on the edge of your bed if possible, or sit up as far as you can in bed or on a chair. Hold the incentive spirometer in an upright position. Breathe out normally. Place the mouthpiece in your mouth and seal your lips tightly around it. Breathe in slowly and as deeply as possible, raising the piston or the ball toward the top of the column. Hold your breath for 3-5 seconds or for as long as possible. Allow the piston or ball to fall to the bottom of the column. Remove the mouthpiece from your mouth and breathe out normally. Rest for a few seconds and repeat Steps 1 through 7 at least 10 times every 1-2 hours when you are awake. Take your time and take a few normal breaths between deep breaths. The spirometer may include an indicator to show your best effort. Use the indicator as a goal to work toward during each repetition. After each set of 10 deep breaths, practice coughing to be sure your lungs are clear. If you have an incision (the cut made at the time of surgery), support your incision when coughing by placing a pillow or rolled up towels firmly against it. Once you are able to get out of bed, walk around indoors and cough well. You may stop using the incentive spirometer when instructed by your caregiver.  RISKS AND COMPLICATIONS Take your time so you do not get dizzy or light-headed. If you are in pain, you may need to take or ask for pain medication before doing incentive spirometry. It is harder to take a deep breath if you are having pain. AFTER USE Rest and breathe slowly and easily. It can be helpful to keep track of a log of your progress. Your caregiver can provide you with a simple table to help with this. If you are using the spirometer at home, follow these instructions: SEEK MEDICAL CARE IF:  You are having difficultly using the spirometer. You have trouble using the spirometer as often as  instructed. Your pain medication is not giving enough relief while using the spirometer. You develop fever of 100.5 F (38.1 C) or higher.                                                                                                    SEEK IMMEDIATE MEDICAL CARE IF:  You cough up bloody sputum that had not been present before. You develop fever of 102 F (38.9 C) or greater. You develop worsening pain at or near the incision site. MAKE SURE YOU:  Understand these instructions. Will watch your  condition. Will get help right away if you are not doing well or get worse. Document Released: 02/20/2007 Document Revised: 01/02/2012 Document Reviewed: 04/23/2007 [facility] Patient Information 2014 [place], [place].       If you would like to see a video about joint replacement:   [email or web address]

## 2024-07-05 NOTE — Anesthesia Preprocedure Evaluation (Addendum)
 Anesthesia Evaluation  Patient identified by MRN, date of birth, ID band Patient awake    Reviewed: Allergy & Precautions, NPO status , Patient's Chart, lab work & pertinent test results  Airway Mallampati: II  TM Distance: >3 FB Neck ROM: Full    Dental  (+) Teeth Intact, Dental Advisory Given   Pulmonary sleep apnea (diagnosed before weight loss, stopped using cpap then)    Pulmonary exam normal breath sounds clear to auscultation       Cardiovascular hypertension (139/86 preop), Pt. on medications Normal cardiovascular exam Rhythm:Regular Rate:Normal     Neuro/Psych  PSYCHIATRIC DISORDERS Anxiety Depression    negative neurological ROS     GI/Hepatic Neg liver ROS, hiatal hernia,GERD  Controlled and Medicated,,  Endo/Other  diabetes, Well Controlled, Type 2, Oral Hypoglycemic AgentsHypothyroidism  BMI 37  Renal/GU negative Renal ROS  negative genitourinary   Musculoskeletal  (+) Arthritis , Osteoarthritis,  S/p L4-5 fusion   Abdominal  (+) + obese  Peds  Hematology  (+) Blood dyscrasia, anemia Hb 10.8, plt 535   Anesthesia Other Findings Zepbound  LD:   Reproductive/Obstetrics negative OB ROS                              Anesthesia Physical Anesthesia Plan  ASA: 3  Anesthesia Plan: Spinal, Regional and MAC   Post-op Pain Management: Regional block* and Tylenol  PO (pre-op)*   Induction:   PONV Risk Score and Plan: 2 and Propofol  infusion and TIVA  Airway Management Planned: Natural Airway and Nasal Cannula  Additional Equipment: None  Intra-op Plan:   Post-operative Plan:   Informed Consent: I have reviewed the patients History and Physical, chart, labs and discussed the procedure including the risks, benefits and alternatives for the proposed anesthesia with the patient or authorized representative who has indicated his/her understanding and acceptance.     Dental  advisory given  Plan Discussed with: CRNA  Anesthesia Plan Comments: (05/2024 TKR under spinal, no issues.)         Anesthesia Quick Evaluation

## 2024-07-09 ENCOUNTER — Observation Stay (HOSPITAL_COMMUNITY)
Admission: RE | Admit: 2024-07-09 | Discharge: 2024-07-10 | Disposition: A | Discharge status: Home / Self Care | Attending: Orthopedic Surgery | Admitting: Orthopedic Surgery

## 2024-07-09 ENCOUNTER — Ambulatory Visit (HOSPITAL_BASED_OUTPATIENT_CLINIC_OR_DEPARTMENT_OTHER): Payer: Self-pay | Admitting: Anesthesiology

## 2024-07-09 ENCOUNTER — Other Ambulatory Visit: Payer: Self-pay

## 2024-07-09 ENCOUNTER — Encounter (HOSPITAL_COMMUNITY): Payer: Self-pay | Admitting: Orthopedic Surgery

## 2024-07-09 ENCOUNTER — Encounter (HOSPITAL_COMMUNITY)
Admission: RE | Disposition: A | Discharge status: Home / Self Care | Payer: Self-pay | Source: Home / Self Care | Attending: Orthopedic Surgery

## 2024-07-09 ENCOUNTER — Ambulatory Visit (HOSPITAL_COMMUNITY): Payer: Self-pay | Admitting: Physician Assistant

## 2024-07-09 DIAGNOSIS — F418 Other specified anxiety disorders: Secondary | ICD-10-CM

## 2024-07-09 DIAGNOSIS — E039 Hypothyroidism, unspecified: Secondary | ICD-10-CM | POA: Diagnosis not present

## 2024-07-09 DIAGNOSIS — I10 Essential (primary) hypertension: Secondary | ICD-10-CM | POA: Insufficient documentation

## 2024-07-09 DIAGNOSIS — E119 Type 2 diabetes mellitus without complications: Secondary | ICD-10-CM | POA: Diagnosis not present

## 2024-07-09 DIAGNOSIS — G4733 Obstructive sleep apnea (adult) (pediatric): Secondary | ICD-10-CM

## 2024-07-09 DIAGNOSIS — M1712 Unilateral primary osteoarthritis, left knee: Secondary | ICD-10-CM

## 2024-07-09 DIAGNOSIS — Z96652 Presence of left artificial knee joint: Secondary | ICD-10-CM | POA: Diagnosis not present

## 2024-07-09 DIAGNOSIS — Z7982 Long term (current) use of aspirin: Secondary | ICD-10-CM | POA: Insufficient documentation

## 2024-07-09 HISTORY — PX: TOTAL KNEE ARTHROPLASTY: SHX125

## 2024-07-09 LAB — GLUCOSE, CAPILLARY: Glucose-Capillary: 150 mg/dL — ABNORMAL HIGH (ref 70–99)

## 2024-07-09 SURGERY — ARTHROPLASTY, KNEE, TOTAL
Anesthesia: Monitor Anesthesia Care | Site: Knee | Laterality: Left

## 2024-07-09 MED ORDER — HYDROMORPHONE HCL 1 MG/ML IJ SOLN: 0.2500 mg | INTRAMUSCULAR | Status: DC | PRN

## 2024-07-09 MED ORDER — ASPIRIN 81 MG PO CHEW
81.0000 mg | CHEWABLE_TABLET | Freq: Two times a day (BID) | ORAL | Status: DC
  Administered 2024-07-09 – 2024-07-10 (×2): 81 mg via ORAL
  Filled 2024-07-09 (×2): qty 1

## 2024-07-09 MED ORDER — DEXAMETHASONE SODIUM PHOSPHATE 10 MG/ML IJ SOLN
INTRAMUSCULAR | Status: AC
  Filled 2024-07-09: qty 1

## 2024-07-09 MED ORDER — SENNA 8.6 MG PO TABS: 2.0000 | ORAL_TABLET | Freq: Every day | ORAL | Status: DC

## 2024-07-09 MED ORDER — MIDAZOLAM HCL 2 MG/2ML IJ SOLN
INTRAMUSCULAR | Status: AC
  Administered 2024-07-09: 2 mg
  Filled 2024-07-09: qty 2

## 2024-07-09 MED ORDER — LACTATED RINGERS IV SOLN: INTRAVENOUS | Status: DC | PRN

## 2024-07-09 MED ORDER — CYCLOBENZAPRINE HCL 5 MG PO TABS
5.0000 mg | ORAL_TABLET | Freq: Three times a day (TID) | ORAL | Status: DC | PRN
Start: 2024-07-09 — End: 2024-07-10
  Administered 2024-07-09 – 2024-07-10 (×2): 5 mg via ORAL
  Filled 2024-07-09 (×2): qty 1

## 2024-07-09 MED ORDER — TRANEXAMIC ACID-NACL 1000-0.7 MG/100ML-% IV SOLN
1000.0000 mg | INTRAVENOUS | Status: AC
  Administered 2024-07-09: 1000 mg via INTRAVENOUS
  Filled 2024-07-09: qty 100

## 2024-07-09 MED ORDER — METFORMIN HCL 500 MG PO TABS
500.0000 mg | ORAL_TABLET | Freq: Two times a day (BID) | ORAL | Status: DC
  Administered 2024-07-10: 500 mg via ORAL
  Filled 2024-07-09: qty 1

## 2024-07-09 MED ORDER — MONTELUKAST SODIUM 10 MG PO TABS
10.0000 mg | ORAL_TABLET | Freq: Every day | ORAL | Status: DC
  Administered 2024-07-09: 10 mg via ORAL
  Filled 2024-07-09: qty 1

## 2024-07-09 MED ORDER — LACTATED RINGERS IV SOLN: INTRAVENOUS | Status: DC

## 2024-07-09 MED ORDER — DIPHENHYDRAMINE HCL 12.5 MG/5ML PO ELIX: 12.5000 mg | ORAL_SOLUTION | ORAL | Status: DC | PRN

## 2024-07-09 MED ORDER — FLUOXETINE HCL 20 MG PO CAPS
40.0000 mg | ORAL_CAPSULE | Freq: Every day | ORAL | Status: DC
  Administered 2024-07-10: 40 mg via ORAL
  Filled 2024-07-09: qty 2

## 2024-07-09 MED ORDER — LOSARTAN POTASSIUM 25 MG PO TABS
25.0000 mg | ORAL_TABLET | Freq: Every day | ORAL | Status: DC
  Administered 2024-07-10: 25 mg via ORAL
  Filled 2024-07-09: qty 1

## 2024-07-09 MED ORDER — DEXAMETHASONE SODIUM PHOSPHATE 10 MG/ML IJ SOLN
8.0000 mg | Freq: Once | INTRAMUSCULAR | Status: AC
  Administered 2024-07-09: 8 mg via INTRAVENOUS

## 2024-07-09 MED ORDER — CEFAZOLIN SODIUM-DEXTROSE 2-4 GM/100ML-% IV SOLN
2.0000 g | Freq: Four times a day (QID) | INTRAVENOUS | Status: AC
  Administered 2024-07-09 – 2024-07-10 (×2): 2 g via INTRAVENOUS
  Filled 2024-07-09 (×2): qty 100

## 2024-07-09 MED ORDER — ONDANSETRON HCL 4 MG/2ML IJ SOLN
INTRAMUSCULAR | Status: DC | PRN
  Administered 2024-07-09: 4 mg via INTRAVENOUS

## 2024-07-09 MED ORDER — ORAL CARE MOUTH RINSE
15.0000 mL | Freq: Once | OROMUCOSAL | Status: AC
  Administered 2024-07-09: 15 mL via OROMUCOSAL

## 2024-07-09 MED ORDER — BISACODYL 10 MG RE SUPP: 10.0000 mg | Freq: Every day | RECTAL | Status: DC | PRN

## 2024-07-09 MED ORDER — CEPHALEXIN 250 MG PO CAPS
250.0000 mg | ORAL_CAPSULE | Freq: Every day | ORAL | Status: DC
  Administered 2024-07-10: 250 mg via ORAL
  Filled 2024-07-09: qty 1

## 2024-07-09 MED ORDER — PHENOL 1.4 % MT LIQD: 1.0000 | OROMUCOSAL | Status: DC | PRN

## 2024-07-09 MED ORDER — CHLORHEXIDINE GLUCONATE 0.12 % MT SOLN: 15.0000 mL | Freq: Once | OROMUCOSAL | Status: AC

## 2024-07-09 MED ORDER — FENTANYL CITRATE (PF) 100 MCG/2ML IJ SOLN
INTRAMUSCULAR | Status: AC
  Filled 2024-07-09: qty 2

## 2024-07-09 MED ORDER — ROSUVASTATIN CALCIUM 10 MG PO TABS
10.0000 mg | ORAL_TABLET | Freq: Every evening | ORAL | Status: DC
  Administered 2024-07-09: 10 mg via ORAL
  Filled 2024-07-09 (×2): qty 1

## 2024-07-09 MED ORDER — ACETAMINOPHEN 325 MG PO TABS: 325.0000 mg | ORAL_TABLET | Freq: Four times a day (QID) | ORAL | Status: DC | PRN

## 2024-07-09 MED ORDER — TRANEXAMIC ACID-NACL 1000-0.7 MG/100ML-% IV SOLN
1000.0000 mg | Freq: Once | INTRAVENOUS | Status: AC
  Administered 2024-07-09: 1000 mg via INTRAVENOUS
  Filled 2024-07-09: qty 100

## 2024-07-09 MED ORDER — SODIUM CHLORIDE 0.9 % IV SOLN: INTRAVENOUS | Status: DC | PRN

## 2024-07-09 MED ORDER — MENTHOL 3 MG MT LOZG: 1.0000 | LOZENGE | OROMUCOSAL | Status: DC | PRN

## 2024-07-09 MED ORDER — SODIUM CHLORIDE (PF) 0.9 % IJ SOLN
INTRAMUSCULAR | Status: DC | PRN
  Administered 2024-07-09: 61 mL

## 2024-07-09 MED ORDER — ONDANSETRON HCL 4 MG PO TABS: 4.0000 mg | ORAL_TABLET | Freq: Four times a day (QID) | ORAL | Status: DC | PRN

## 2024-07-09 MED ORDER — PROPOFOL 10 MG/ML IV BOLUS
INTRAVENOUS | Status: AC
  Filled 2024-07-09: qty 20

## 2024-07-09 MED ORDER — CEFAZOLIN SODIUM-DEXTROSE 2-4 GM/100ML-% IV SOLN
2.0000 g | INTRAVENOUS | Status: AC
Start: 2024-07-09 — End: 2024-07-09
  Administered 2024-07-09: 2 g via INTRAVENOUS
  Filled 2024-07-09: qty 100

## 2024-07-09 MED ORDER — PANTOPRAZOLE SODIUM 40 MG PO TBEC
40.0000 mg | DELAYED_RELEASE_TABLET | Freq: Every day | ORAL | Status: DC
  Administered 2024-07-09 – 2024-07-10 (×2): 40 mg via ORAL
  Filled 2024-07-09 (×2): qty 1

## 2024-07-09 MED ORDER — HYDROMORPHONE HCL 1 MG/ML IJ SOLN
0.5000 mg | INTRAMUSCULAR | Status: DC | PRN
  Administered 2024-07-09: 1 mg via INTRAVENOUS
  Filled 2024-07-09: qty 1

## 2024-07-09 MED ORDER — 0.9 % SODIUM CHLORIDE (POUR BTL) OPTIME
TOPICAL | Status: DC | PRN
  Administered 2024-07-09: 1000 mL

## 2024-07-09 MED ORDER — METOCLOPRAMIDE HCL 5 MG PO TABS: 5.0000 mg | ORAL_TABLET | Freq: Three times a day (TID) | ORAL | Status: DC | PRN

## 2024-07-09 MED ORDER — MECLIZINE HCL 25 MG PO TABS: 25.0000 mg | ORAL_TABLET | Freq: Three times a day (TID) | ORAL | Status: DC | PRN

## 2024-07-09 MED ORDER — CELECOXIB 200 MG PO CAPS
200.0000 mg | ORAL_CAPSULE | Freq: Two times a day (BID) | ORAL | Status: DC
  Administered 2024-07-09 – 2024-07-10 (×2): 200 mg via ORAL
  Filled 2024-07-09 (×2): qty 1

## 2024-07-09 MED ORDER — ACETAMINOPHEN 500 MG PO TABS
1000.0000 mg | ORAL_TABLET | Freq: Four times a day (QID) | ORAL | Status: DC
  Administered 2024-07-09 – 2024-07-10 (×3): 1000 mg via ORAL
  Filled 2024-07-09 (×3): qty 2

## 2024-07-09 MED ORDER — OXYCODONE HCL 5 MG PO TABS: 5.0000 mg | ORAL_TABLET | Freq: Once | ORAL | Status: DC | PRN

## 2024-07-09 MED ORDER — OXYCODONE HCL 5 MG/5ML PO SOLN: 5.0000 mg | Freq: Once | ORAL | Status: DC | PRN

## 2024-07-09 MED ORDER — PROPOFOL 500 MG/50ML IV EMUL
INTRAVENOUS | Status: DC | PRN
  Administered 2024-07-09: 100 ug/kg/min via INTRAVENOUS

## 2024-07-09 MED ORDER — ROPIVACAINE HCL 5 MG/ML IJ SOLN
INTRAMUSCULAR | Status: DC | PRN
  Administered 2024-07-09: 30 mL via PERINEURAL

## 2024-07-09 MED ORDER — INSULIN ASPART 100 UNIT/ML IJ SOLN
0.0000 [IU] | Freq: Three times a day (TID) | INTRAMUSCULAR | Status: DC
  Administered 2024-07-10: 2 [IU] via SUBCUTANEOUS
  Administered 2024-07-10: 3 [IU] via SUBCUTANEOUS

## 2024-07-09 MED ORDER — FAMOTIDINE 20 MG PO TABS: 20.0000 mg | ORAL_TABLET | Freq: Two times a day (BID) | ORAL | Status: DC | PRN

## 2024-07-09 MED ORDER — KETOROLAC TROMETHAMINE 30 MG/ML IJ SOLN
INTRAMUSCULAR | Status: AC
  Filled 2024-07-09: qty 1

## 2024-07-09 MED ORDER — POLYETHYLENE GLYCOL 3350 17 G PO PACK
17.0000 g | PACK | Freq: Two times a day (BID) | ORAL | Status: DC
  Filled 2024-07-09: qty 1

## 2024-07-09 MED ORDER — ONDANSETRON HCL 4 MG/2ML IJ SOLN: 4.0000 mg | Freq: Once | INTRAMUSCULAR | Status: DC | PRN

## 2024-07-09 MED ORDER — OXYCODONE HCL 5 MG PO TABS
5.0000 mg | ORAL_TABLET | ORAL | Status: DC | PRN
  Administered 2024-07-09: 5 mg via ORAL
  Administered 2024-07-10: 10 mg via ORAL
  Administered 2024-07-10: 5 mg via ORAL
  Filled 2024-07-09: qty 2
  Filled 2024-07-09: qty 1

## 2024-07-09 MED ORDER — AMISULPRIDE (ANTIEMETIC) 5 MG/2ML IV SOLN: 10.0000 mg | Freq: Once | INTRAVENOUS | Status: DC | PRN

## 2024-07-09 MED ORDER — PHENYLEPHRINE HCL-NACL 20-0.9 MG/250ML-% IV SOLN
INTRAVENOUS | Status: DC | PRN
  Administered 2024-07-09: 20 ug/min via INTRAVENOUS

## 2024-07-09 MED ORDER — OXYCODONE HCL 5 MG PO TABS
ORAL_TABLET | ORAL | Status: AC
  Filled 2024-07-09: qty 1

## 2024-07-09 MED ORDER — POVIDONE-IODINE 10 % EX SWAB
2.0000 | Freq: Once | CUTANEOUS | Status: AC
  Administered 2024-07-09: 2 via TOPICAL

## 2024-07-09 MED ORDER — SODIUM CHLORIDE (PF) 0.9 % IJ SOLN
INTRAMUSCULAR | Status: AC
Start: 2024-07-09 — End: 2024-07-09
  Filled 2024-07-09: qty 30

## 2024-07-09 MED ORDER — OXYCODONE HCL 5 MG PO TABS: 10.0000 mg | ORAL_TABLET | ORAL | Status: DC | PRN

## 2024-07-09 MED ORDER — ACETAMINOPHEN 500 MG PO TABS
1000.0000 mg | ORAL_TABLET | Freq: Once | ORAL | Status: AC
Start: 2024-07-09 — End: 2024-07-09
  Administered 2024-07-09: 1000 mg via ORAL
  Filled 2024-07-09: qty 2

## 2024-07-09 MED ORDER — PROPOFOL 1000 MG/100ML IV EMUL
INTRAVENOUS | Status: AC
  Filled 2024-07-09: qty 100

## 2024-07-09 MED ORDER — DEXAMETHASONE SODIUM PHOSPHATE 10 MG/ML IJ SOLN
INTRAMUSCULAR | Status: DC | PRN
  Administered 2024-07-09: 10 mg

## 2024-07-09 MED ORDER — BUPIVACAINE-EPINEPHRINE (PF) 0.25% -1:200000 IJ SOLN
INTRAMUSCULAR | Status: AC
  Filled 2024-07-09: qty 30

## 2024-07-09 MED ORDER — ONDANSETRON HCL 4 MG/2ML IJ SOLN: 4.0000 mg | Freq: Four times a day (QID) | INTRAMUSCULAR | Status: DC | PRN

## 2024-07-09 MED ORDER — ONDANSETRON HCL 4 MG/2ML IJ SOLN
INTRAMUSCULAR | Status: AC
Start: 2024-07-09 — End: 2024-07-09
  Filled 2024-07-09: qty 2

## 2024-07-09 MED ORDER — SODIUM CHLORIDE 0.9 % IR SOLN
Status: DC | PRN
  Administered 2024-07-09: 1000 mL

## 2024-07-09 MED ORDER — STERILE WATER FOR IRRIGATION IR SOLN
Status: DC | PRN
  Administered 2024-07-09: 2000 mL

## 2024-07-09 MED ORDER — FENTANYL CITRATE PF 50 MCG/ML IJ SOSY
PREFILLED_SYRINGE | INTRAMUSCULAR | Status: AC
  Administered 2024-07-09: 100 ug
  Filled 2024-07-09: qty 2

## 2024-07-09 MED ORDER — METOCLOPRAMIDE HCL 5 MG/ML IJ SOLN: 5.0000 mg | Freq: Three times a day (TID) | INTRAMUSCULAR | Status: DC | PRN

## 2024-07-09 MED ORDER — FENTANYL CITRATE (PF) 100 MCG/2ML IJ SOLN
INTRAMUSCULAR | Status: DC | PRN
  Administered 2024-07-09 (×2): 50 ug via INTRAVENOUS

## 2024-07-09 MED ORDER — MIDAZOLAM HCL 2 MG/2ML IJ SOLN
INTRAMUSCULAR | Status: AC
  Filled 2024-07-09: qty 2

## 2024-07-09 MED ORDER — DEXAMETHASONE SODIUM PHOSPHATE 10 MG/ML IJ SOLN
10.0000 mg | Freq: Once | INTRAMUSCULAR | Status: AC
  Administered 2024-07-10: 10 mg via INTRAVENOUS
  Filled 2024-07-09: qty 1

## 2024-07-09 MED ORDER — LEVOTHYROXINE SODIUM 125 MCG PO TABS
125.0000 ug | ORAL_TABLET | Freq: Every day | ORAL | Status: DC
  Administered 2024-07-10: 125 ug via ORAL
  Filled 2024-07-09: qty 1

## 2024-07-09 MED ORDER — SODIUM CHLORIDE 0.9 % IV SOLN: INTRAVENOUS | Status: DC

## 2024-07-09 SURGICAL SUPPLY — 45 items
ATTUNE MED ANAT PAT 32 KNEE (Knees) IMPLANT
ATTUNE PSFEM LTSZ4 NARCEM KNEE (Femur) IMPLANT
ATTUNE PSRP INSR SZ4 7 KNEE (Insert) IMPLANT
BAG COUNTER SPONGE SURGICOUNT (BAG) IMPLANT
BAG ZIPLOCK 12X15 (MISCELLANEOUS) ×2 IMPLANT
BASEPLATE TIBIAL ROTATING SZ 4 (Knees) IMPLANT
BLADE SAW SGTL 11.0X1.19X90.0M (BLADE) IMPLANT
BLADE SAW SGTL 13.0X1.19X90.0M (BLADE) ×2 IMPLANT
BNDG ELASTIC 6INX 5YD STR LF (GAUZE/BANDAGES/DRESSINGS) ×2 IMPLANT
BOWL SMART MIX CTS (DISPOSABLE) ×2 IMPLANT
CEMENT HV SMART SET (Cement) ×4 IMPLANT
COVER SURGICAL LIGHT HANDLE (MISCELLANEOUS) ×2 IMPLANT
CUFF TRNQT CYL 34X4.125X (TOURNIQUET CUFF) ×2 IMPLANT
DERMABOND ADVANCED .7 DNX12 (GAUZE/BANDAGES/DRESSINGS) ×2 IMPLANT
DRAPE U-SHAPE 47X51 STRL (DRAPES) ×2 IMPLANT
DRESSING AQUACEL AG SP 3.5X10 (GAUZE/BANDAGES/DRESSINGS) ×2 IMPLANT
DURAPREP 26ML APPLICATOR (WOUND CARE) ×4 IMPLANT
ELECT REM PT RETURN 15FT ADLT (MISCELLANEOUS) ×2 IMPLANT
GLOVE BIO SURGEON STRL SZ 6 (GLOVE) ×2 IMPLANT
GLOVE BIOGEL PI IND STRL 6.5 (GLOVE) ×2 IMPLANT
GLOVE BIOGEL PI IND STRL 7.5 (GLOVE) ×2 IMPLANT
GLOVE ORTHO TXT STRL SZ7.5 (GLOVE) ×4 IMPLANT
GOWN STRL REUS W/ TWL LRG LVL3 (GOWN DISPOSABLE) ×4 IMPLANT
HOLDER FOLEY CATH W/STRAP (MISCELLANEOUS) IMPLANT
KIT TURNOVER KIT A (KITS) ×2 IMPLANT
MANIFOLD NEPTUNE II (INSTRUMENTS) ×2 IMPLANT
NDL SAFETY ECLIPSE 18X1.5 (NEEDLE) IMPLANT
NS IRRIG 1000ML POUR BTL (IV SOLUTION) ×2 IMPLANT
PACK TOTAL KNEE CUSTOM (KITS) ×2 IMPLANT
PENCIL SMOKE EVACUATOR (MISCELLANEOUS) ×2 IMPLANT
PIN FIX SIGMA LCS THRD HI (PIN) IMPLANT
PROTECTOR NERVE ULNAR (MISCELLANEOUS) ×2 IMPLANT
SET HNDPC FAN SPRY TIP SCT (DISPOSABLE) ×2 IMPLANT
SET PAD KNEE POSITIONER (MISCELLANEOUS) ×2 IMPLANT
SPIKE FLUID TRANSFER (MISCELLANEOUS) ×4 IMPLANT
SUT MNCRL AB 4-0 PS2 18 (SUTURE) ×2 IMPLANT
SUT STRATAFIX PDS+ 0 24IN (SUTURE) ×2 IMPLANT
SUT VIC AB 1 CT1 36 (SUTURE) ×2 IMPLANT
SUT VIC AB 2-0 CT1 TAPERPNT 27 (SUTURE) ×4 IMPLANT
SYR 3ML LL SCALE MARK (SYRINGE) ×2 IMPLANT
TOWEL GREEN STERILE FF (TOWEL DISPOSABLE) ×2 IMPLANT
TRAY FOLEY MTR SLVR 16FR STAT (SET/KITS/TRAYS/PACK) ×2 IMPLANT
TUBE SUCTION HIGH CAP CLEAR NV (SUCTIONS) ×2 IMPLANT
WATER STERILE IRR 1000ML POUR (IV SOLUTION) ×4 IMPLANT
WRAP KNEE MAXI GEL POST OP (GAUZE/BANDAGES/DRESSINGS) ×2 IMPLANT

## 2024-07-09 NOTE — Transfer of Care (Signed)
 Immediate Anesthesia Transfer of Care Note  Patient: Melissa [name]  Procedure(s) Performed: ARTHROPLASTY, KNEE, TOTAL (Left: Knee)  Patient Location: PACU  Anesthesia Type:MAC combined with regional for post-op pain  Level of Consciousness: awake, alert , and oriented  Airway & Oxygen Therapy: Patient Spontanous Breathing  Post-op Assessment: Report given to RN and Post -op Vital signs reviewed and stable  Post vital signs: Reviewed and stable  Last Vitals:  Vitals Value Taken Time  BP 146/74 07/09/24 16:16  Temp    Pulse 79 07/09/24 16:19  Resp 15 07/09/24 16:19  SpO2 99 % 07/09/24 16:19  Vitals shown include unfiled device data.  Last Pain:  Vitals:   07/09/24 1340  TempSrc:   PainSc: 0-No pain         Complications: No notable events documented.

## 2024-07-09 NOTE — Discharge Instructions (Signed)
INSTRUCTIONS AFTER JOINT REPLACEMENT   Remove items at home which could result in a fall. This includes throw rugs or furniture in walking pathways ICE to the affected joint every three hours while awake for 30 minutes at a time, for at least the first 3-5 days, and then as needed for pain and swelling.  Continue to use ice for pain and swelling. You may notice swelling that will progress down to the foot and ankle.  This is normal after surgery.  Elevate your leg when you are not up walking on it.   Continue to use the breathing machine you got in the hospital (incentive spirometer) which will help keep your temperature down.  It is common for your temperature to cycle up and down following surgery, especially at night when you are not up moving around and exerting yourself.  The breathing machine keeps your lungs expanded and your temperature down.   DIET:  As you were doing prior to hospitalization, we recommend a well-balanced diet.  DRESSING / WOUND CARE / SHOWERING  Keep the surgical dressing until follow up.  The dressing is water proof, so you can shower without any extra covering.  IF THE DRESSING FALLS OFF or the wound gets wet inside, change the dressing with sterile gauze.  Please use good hand washing techniques before changing the dressing.  Do not use any lotions or creams on the incision until instructed by your surgeon.    ACTIVITY  Increase activity slowly as tolerated, but follow the weight bearing instructions below.   No driving for 6 weeks or until further direction given by your physician.  You cannot drive while taking narcotics.  No lifting or carrying greater than 10 lbs. until further directed by your surgeon. Avoid periods of inactivity such as sitting longer than an hour when not asleep. This helps prevent blood clots.  You may return to work once you are authorized by your doctor.     WEIGHT BEARING   Weight bearing as tolerated with assist device (walker, cane,  etc) as directed, use it as long as suggested by your surgeon or therapist, typically at least 4-6 weeks.   EXERCISES  Results after joint replacement surgery are often greatly improved when you follow the exercise, range of motion and muscle strengthening exercises prescribed by your doctor. Safety measures are also important to protect the joint from further injury. Any time any of these exercises cause you to have increased pain or swelling, decrease what you are doing until you are comfortable again and then slowly increase them. If you have problems or questions, call your caregiver or physical therapist for advice.   Rehabilitation is important following a joint replacement. After just a few days of immobilization, the muscles of the leg can become weakened and shrink (atrophy).  These exercises are designed to build up the tone and strength of the thigh and leg muscles and to improve motion. Often times heat used for twenty to thirty minutes before working out will loosen up your tissues and help with improving the range of motion but do not use heat for the first two weeks following surgery (sometimes heat can increase post-operative swelling).   These exercises can be done on a training (exercise) mat, on the floor, on a table or on a bed. Use whatever works the best and is most comfortable for you.    Use music or television while you are exercising so that the exercises are a pleasant break in your  day. This will make your life better with the exercises acting as a break in your routine that you can look forward to.   Perform all exercises about fifteen times, three times per day or as directed.  You should exercise both the operative leg and the other leg as well.  Exercises include:   Quad Sets - Tighten up the muscle on the front of the thigh (Quad) and hold for 5-10 seconds.   Straight Leg Raises - With your knee straight (if you were given a brace, keep it on), lift the leg to 60  degrees, hold for 3 seconds, and slowly lower the leg.  Perform this exercise against resistance later as your leg gets stronger.  Leg Slides: Lying on your back, slowly slide your foot toward your buttocks, bending your knee up off the floor (only go as far as is comfortable). Then slowly slide your foot back down until your leg is flat on the floor again.  Angel Wings: Lying on your back spread your legs to the side as far apart as you can without causing discomfort.  Hamstring Strength:  Lying on your back, push your heel against the floor with your leg straight by tightening up the muscles of your buttocks.  Repeat, but this time bend your knee to a comfortable angle, and push your heel against the floor.  You may put a pillow under the heel to make it more comfortable if necessary.   A rehabilitation program following joint replacement surgery can speed recovery and prevent re-injury in the future due to weakened muscles. Contact your doctor or a physical therapist for more information on knee rehabilitation.    CONSTIPATION  Constipation is defined medically as fewer than three stools per week and severe constipation as less than one stool per week.  Even if you have a regular bowel pattern at home, your normal regimen is likely to be disrupted due to multiple reasons following surgery.  Combination of anesthesia, postoperative narcotics, change in appetite and fluid intake all can affect your bowels.   YOU MUST use at least one of the following options; they are listed in order of increasing strength to get the job done.  They are all available over the counter, and you may need to use some, POSSIBLY even all of these options:    Drink plenty of fluids (prune juice may be helpful) and high fiber foods Colace 100 mg by mouth twice a day  Senokot for constipation as directed and as needed Dulcolax (bisacodyl), take with full glass of water  Miralax (polyethylene glycol) once or twice a day as  needed.  If you have tried all these things and are unable to have a bowel movement in the first 3-4 days after surgery call either your surgeon or your primary doctor.    If you experience loose stools or diarrhea, hold the medications until you stool forms back up.  If your symptoms do not get better within 1 week or if they get worse, check with your doctor.  If you experience "the worst abdominal pain ever" or develop nausea or vomiting, please contact the office immediately for further recommendations for treatment.   ITCHING:  If you experience itching with your medications, try taking only a single pain pill, or even half a pain pill at a time.  You can also use Benadryl over the counter for itching or also to help with sleep.   TED HOSE STOCKINGS:  Use stockings on both  legs until for at least 2 weeks or as directed by physician office. They may be removed at night for sleeping.  MEDICATIONS:  See your medication summary on the "After Visit Summary" that nursing will review with you.  You may have some home medications which will be placed on hold until you complete the course of blood thinner medication.  It is important for you to complete the blood thinner medication as prescribed.  PRECAUTIONS:  If you experience chest pain or shortness of breath - call 911 immediately for transfer to the hospital emergency department.   If you develop a fever greater that 101 F, purulent drainage from wound, increased redness or drainage from wound, foul odor from the wound/dressing, or calf pain - CONTACT YOUR SURGEON.                                                   FOLLOW-UP APPOINTMENTS:  If you do not already have a post-op appointment, please call the office for an appointment to be seen by your surgeon.  Guidelines for how soon to be seen are listed in your "After Visit Summary", but are typically between 1-4 weeks after surgery.  OTHER INSTRUCTIONS:   Knee Replacement:  Do not place pillow  under knee, focus on keeping the knee straight while resting. CPM instructions: 0-90 degrees, 2 hours in the morning, 2 hours in the afternoon, and 2 hours in the evening. Place foam block, curve side up under heel at all times except when in CPM or when walking.  DO NOT modify, tear, cut, or change the foam block in any way.  POST-OPERATIVE OPIOID TAPER INSTRUCTIONS: It is important to wean off of your opioid medication as soon as possible. If you do not need pain medication after your surgery it is ok to stop day one. Opioids include: Codeine, Hydrocodone(Norco, Vicodin), Oxycodone(Percocet, oxycontin) and hydromorphone amongst others.  Long term and even short term use of opiods can cause: Increased pain response Dependence Constipation Depression Respiratory depression And more.  Withdrawal symptoms can include Flu like symptoms Nausea, vomiting And more Techniques to manage these symptoms Hydrate well Eat regular healthy meals Stay active Use relaxation techniques(deep breathing, meditating, yoga) Do Not substitute Alcohol to help with tapering If you have been on opioids for less than two weeks and do not have pain than it is ok to stop all together.  Plan to wean off of opioids This plan should start within one week post op of your joint replacement. Maintain the same interval or time between taking each dose and first decrease the dose.  Cut the total daily intake of opioids by one tablet each day Next start to increase the time between doses. The last dose that should be eliminated is the evening dose.   MAKE SURE YOU:  Understand these instructions.  Get help right away if you are not doing well or get worse.    Thank you for letting us be a part of your medical care team.  It is a privilege we respect greatly.  We hope these instructions will help you stay on track for a fast and full recovery!   Information on my medicine - ELIQUIS (apixaban)   Why was Eliquis  prescribed for you? Eliquis was prescribed for you to reduce the risk of blood clots forming after orthopedic  surgery.    What do You need to know about Eliquis? Take your Eliquis TWICE DAILY - one tablet in the morning and one tablet in the evening with or without food.  It would be best to take the dose about the same time each day.  If you have difficulty swallowing the tablet whole please discuss with your pharmacist how to take the medication safely.  Take Eliquis exactly as prescribed by your doctor and DO NOT stop taking Eliquis without talking to the doctor who prescribed the medication.  Stopping without other medication to take the place of Eliquis may increase your risk of developing a clot.  After discharge, you should have regular check-up appointments with your healthcare provider that is prescribing your Eliquis.  What do you do if you miss a dose? If a dose of ELIQUIS is not taken at the scheduled time, take it as soon as possible on the same day and twice-daily administration should be resumed.  The dose should not be doubled to make up for a missed dose.  Do not take more than one tablet of ELIQUIS at the same time.  Important Safety Information A possible side effect of Eliquis is bleeding. You should call your healthcare provider right away if you experience any of the following: Bleeding from an injury or your nose that does not stop. Unusual colored urine (red or dark brown) or unusual colored stools (red or black). Unusual bruising for unknown reasons. A serious fall or if you hit your head (even if there is no bleeding).  Some medicines may interact with Eliquis and might increase your risk of bleeding or clotting while on Eliquis. To help avoid this, consult your healthcare provider or pharmacist prior to using any new prescription or non-prescription medications, including herbals, vitamins, non-steroidal anti-inflammatory drugs (NSAIDs) and  supplements.  This website has more information on Eliquis (apixaban): http://www.eliquis.com/eliquis/home

## 2024-07-09 NOTE — Op Note (Signed)
 NAME:  Melissa [name]                      MEDICAL RECORD NO.:  [id number]                             FACILITY:  [facility]      PHYSICIAN:  [name], M.D.  DATE OF BIRTH:  11-Jul-1966      DATE OF PROCEDURE:  07/09/2024                                     OPERATIVE REPORT         PREOPERATIVE DIAGNOSIS:  Left knee osteoarthritis.      POSTOPERATIVE DIAGNOSIS:  Left knee osteoarthritis.      FINDINGS:  The patient was noted to have complete loss of cartilage and   bone-on-bone arthritis with associated osteophytes in the medial and patellofemoral compartments of   the knee with a significant synovitis and associated effusion.  The patient had failed months of conservative treatment including medications, injection therapy, activity modification.     PROCEDURE:  Left total knee replacement.      COMPONENTS USED:  DePuy Attune rotating platform posterior stabilized knee   system, a size 4N femur, 4 tibia, size 7 mm PS AOX insert, and 32 anatomic patellar   button.      SURGEON:  [name], M.D.      ASSISTANT:  Rosina Calin, PA-C.      ANESTHESIA:  Regional and Spinal.      SPECIMENS:  None.      COMPLICATION:  None.      DRAINS:  None.  EBL: <200 cc      TOURNIQUET TIME:  28 min at 225 mmHg     The patient was stable to the recovery room.      INDICATION FOR PROCEDURE:  Melissa [name] is a 58 y.o. female patient of   mine.  The patient had been seen, evaluated, and treated for months conservatively in the   office with medication, activity modification, and injections.  The patient had   radiographic changes of bone-on-bone arthritis with endplate sclerosis and osteophytes noted.  Based on the radiographic changes and failed conservative measures, the patient   decided to proceed with definitive treatment, total knee replacement.  Risks of infection, DVT, component failure, need for revision surgery, neurovascular injury were reviewed in the office setting.   The postop course was reviewed stressing the efforts to maximize post-operative satisfaction and function.  Consent was obtained for benefit of pain   relief.      PROCEDURE IN DETAIL:  The patient was brought to the operative theater.   Once adequate anesthesia, preoperative antibiotics, 2 gm of Ancef ,1 gm of Tranexamic Acid , and 10 mg of Decadron  administered, the patient was positioned supine with left thigh tourniquet placed.  The  left lower extremity was prepped and draped in sterile fashion.  A time-   out was performed identifying the patient, planned procedure, and the appropriate extremity.      The left lower extremity was placed in the [facility] leg holder.  The leg was   exsanguinated, tourniquet elevated to 225 mmHg.  A midline incision was   made followed by median parapatellar arthrotomy.  Following initial   exposure, attention was first  directed to the patella.  Precut   measurement was noted to be 22 mm.  I resected down to 13 mm and used a   32 anatomic patellar button to restore patellar height as well as cover the cut surface.      The lug holes were drilled and a metal shim was placed to protect the   patella from retractors and saw blade during the procedure.      At this point, attention was now directed to the femur.  The femoral   canal was opened with a drill, irrigated to try to prevent fat emboli.  An   intramedullary rod was passed at 3 degrees valgus, 9 mm of bone was   resected off the distal femur.  Following this resection, the tibia was   subluxated anteriorly.  Using the extramedullary guide, 2 mm of bone was resected off   the proximal medial tibia.  We confirmed the gap would be   stable medially and laterally with a size 5 spacer block as well as confirmed that the tibial cut was perpendicular in the coronal plane, checking with an alignment rod.      Once this was done, I sized the femur to be a size 4 in the anterior-   posterior dimension, chose a  narrow component based on medial and   lateral dimension.  The size 4 rotation block was then pinned in   position anterior referenced using the C-clamp to set rotation.  The   anterior, posterior, and  chamfer cuts were made without difficulty nor   notching making certain that I was along the anterior cortex to help   with flexion gap stability.      The final box cut was made off the lateral aspect of distal femur.      At this point, the tibia was sized to be a size 4.  The size 4 tray was   then pinned in position through the medial third of the tubercle,   drilled, and keel punched.  Trial reduction was now carried with a 4 femur,  4 tibia, a size 7 mm PS insert, and the 32 anatomic patella botton.  The knee was brought to full extension with good flexion stability with the patella   tracking through the trochlea without application of pressure.  Given   all these findings the trial components removed.  Final components were   opened and cement was mixed.  The knee was irrigated with normal saline solution and pulse lavage.  The synovial lining was   then injected with 30 cc of 0.25% Marcaine  with epinephrine , 1 cc of Toradol  and 30 cc of NS for a total of 61 cc.     Final implants were then cemented onto cleaned and dried cut surfaces of bone with the knee brought to extension with a size 7 mm PS trial insert.      Once the cement had fully cured, excess cement was removed   throughout the knee.  I confirmed that I was satisfied with the range of   motion and stability, and the final size 7 mm PS AOX insert was chosen.  It was   placed into the knee.      The tourniquet had been let down at 28 minutes.  No significant   hemostasis was required.  The extensor mechanism was then reapproximated using #1 Vicryl and #1 Stratafix sutures with the knee   in flexion.  The  remaining wound was closed with 2-0 Vicryl and running 4-0 Monocryl.   The knee was cleaned, dried, dressed  sterilely using Dermabond and   Aquacel dressing.  The patient was then   brought to recovery room in stable condition, tolerating the procedure   well.   Please note that Physician Assistant, Rosina Calin, PA-C was present for the entirety of the case, and was utilized for pre-operative positioning, peri-operative retractor management, general facilitation of the procedure and for primary wound closure at the end of the case.              Donnice CORDOBA Ernie, M.D.    07/09/2024 1:18 PM

## 2024-07-09 NOTE — H&P (Signed)
 TOTAL KNEE ADMISSION H&P  Patient is being admitted for left total knee arthroplasty.  Therapy Plans: outpatient therapy at [facility] Disposition: Home with roommate and best friend Planned DVT Prophylaxis: aspirin  81mg  BID DME needed: walker PCP: Kristin Shepperson - clearance received TXA: IV Allergies: bee venom - anaphylaxis Anesthesia Concerns: none  BMI: 37.6  Last HgbA1c: 6.1%   Other:  - staying overnight - History of pyelonephritis -- planning for nephrectomy after knee surgeries (Dr. Cam) - oxycodone , flexeril , tylenol , celebrex  - No hx of VTE or cancer - Has an ice machine  Subjective:  Chief Complaint:left knee pain.  HPI: Melissa [name], 58 y.o. female, has a history of pain and functional disability in the left knee due to arthritis and has failed non-surgical conservative treatments for greater than 12 weeks to includeNSAID's and/or analgesics, corticosteriod injections, and activity modification.  Onset of symptoms was gradual, starting 2 years ago with gradually worsening course since that time. The patient noted prior procedures on the knee to include  unicompartmental arthroplasty on the left knee(s).  Patient currently rates pain in the left knee(s) at 8 out of 10 with activity. Patient has worsening of pain with activity and weight bearing, pain that interferes with activities of daily living, and pain with passive range of motion.  Patient has evidence of joint space narrowing by imaging studies.  There is no active infection.  Patient Active Problem List   Diagnosis Date Noted   S/P total knee arthroplasty, right 05/28/2024   Acute pyelonephritis 04/09/2024   Hypokalemia 04/09/2024   HTN (hypertension) 04/09/2024   Anxiety 04/09/2024   Status post reverse total arthroplasty of right shoulder 02/15/2023   Bilateral chronic knee pain 11/09/2022   Depression 09/19/2022   DOE (dyspnea on exertion) 08/18/2022   Allergic rhinitis 08/18/2022   S/P lumbar  fusion 05/09/2022   Lumbar spine pain 03/08/2022   Insulin  resistance 01/11/2022   SOB (shortness of breath) 01/11/2022   Hyperkalemia 08/11/2021   Major depressive disorder, recurrent episode, mild with anxious distress (HCC) 06/21/2021   Lumbar radiculopathy 06/01/2021   HLD (hyperlipidemia) 06/01/2021   Hypothyroidism 11/30/2020   Prediabetes 11/30/2020   Constipated    DDD (degenerative disc disease), cervical 08/09/2018   Neck pain 07/21/2018   Chronic back pain 04/03/2018   Spondylolisthesis 04/03/2018   Osteoarthritis of right knee 02/23/2018   Osteoarthritis of left knee 02/05/2018   Acquired hypothyroidism 12/13/2016   History of systemic reaction to bee sting 12/13/2016   Atrophy of left kidney 09/10/2015   Hydronephrosis of right kidney 09/10/2015   Obstructive sleep apnea syndrome, mild 08/05/2014   Expected blood loss anemia 11/06/2012   Obesity (BMI 30-39.9) 11/06/2012   S/P right UKR 11/05/2012   Major depression, recurrent, chronic (HCC) 02/08/2011   Vitamin D  deficiency 04/30/2010   Diaphragmatic hernia 09/11/2007   Past Medical History:  Diagnosis Date   Allergic rhinitis    Anemia    Anxiety    Arthritis    Back pain    Bronchitis    Constipated    Depression    Fatty liver    GERD (gastroesophageal reflux disease)    History of hiatal hernia    History of kidney stones    Hypertension    Hypothyroidism    IBS (irritable bowel syndrome)    Joint pain    Kidney problem    one functioning kidney due to left renal atrophy   Lactose intolerance    Morbid obesity (HCC)  OSA (obstructive sleep apnea)    No CPAP since she lost weight   Osteoarthritis    Pre-diabetes    Seasonal allergies    Tinnitus    Vertigo    Vitamin D  deficiency     Past Surgical History:  Procedure Laterality Date   ABDOMINAL EXPOSURE N/A 05/09/2022   Procedure: ABDOMINAL EXPOSURE;  Surgeon: Gretta Lonni PARAS, MD;  Location: [facility] OR;  Service: Vascular;  Laterality:  N/A;   bilateral thumb surgery      CARPAL TUNNEL RELEASE     rt/lt   COLONOSCOPY     FINGER ARTHRODESIS  11/08/2011   Procedure: ARTHRODESIS FINGER;  Surgeon: Lamar LULLA Leonor Mickey., MD;  Location: [facility];  Service: Orthopedics;  Laterality: Left;  left thumb carpal-metacarpal fusion   KNEE ARTHROSCOPY     both knees   OBLIQUE LUMBAR INTERBODY FUSION 1 LEVEL WITH PERCUTANEOUS SCREWS N/A 05/09/2022   Procedure: OBLIQUE LUMBAR INTERBODY FUSION 1 LEVEL WITH PERCUTANEOUS SCREWS (OLIF L4-5 WITH POSTERIOR SPINAL FUSION INTERBODY);  Surgeon: Burnetta Aures, MD;  Location: [facility] OR;  Service: Orthopedics;  Laterality: N/A;  4 hrs Dr. Gretta to do approach Left tap block with exparel    PARTIAL KNEE ARTHROPLASTY  11/05/2012   Procedure: UNICOMPARTMENTAL KNEE;  Surgeon: [name], MD;  Location: [facility] ORS;  Service: Orthopedics;  Laterality: Right;   TONSILLECTOMY     TOTAL KNEE ARTHROPLASTY Right 05/28/2024   Procedure: ARTHROPLASTY, KNEE, TOTAL;  Surgeon: Car Donnice, MD;  Location: [facility] ORS;  Service: Orthopedics;  Laterality: Right;  Conversion of right partial knee arthroplasty to right total knee arthroplasty   TOTAL SHOULDER ARTHROPLASTY Right 06/25/2020   Procedure: TOTAL SHOULDER ARTHROPLASTY;  Surgeon: Sharl Selinda Dover, MD;  Location: [facility];  Service: Orthopedics;  Laterality: Right;  2.5 hrs   TOTAL SHOULDER REVISION Right 02/15/2023   Procedure: TOTAL SHOULDER REVISION;  Surgeon: Cristy Bonner DASEN, MD;  Location: [facility];  Service: Orthopedics;  Laterality: Right;   URETER SURGERY     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   Current Outpatient Medications  Medication Sig Dispense Refill Last Dose/Taking   acetaminophen  (TYLENOL ) 500 MG tablet Take 2 tablets (1,000 mg total) by mouth every 6 (six) hours as needed (pain).  (Patient taking differently: Take 1,500-2,000 mg by mouth at bedtime.)   Taking Differently   Artificial Tears ophthalmic solution Place 1 drop into both eyes 4 (four) times daily as needed (for irritation).   Taking As Needed   aspirin  EC 81 MG tablet Take 81 mg by mouth in the morning. Swallow whole.   Taking   BENADRYL  ALLERGY 25 MG tablet Take 75 mg by mouth at bedtime.   Taking   BREYNA 80-4.5 MCG/ACT inhaler Inhale 2 puffs into the lungs 2 (two) times daily as needed (for respiratory flares).   Taking As Needed   buPROPion  HCl (BUDEPRION XL PO) Take 300 mg by mouth in the morning.   Taking   cephALEXin  (KEFLEX ) 250 MG capsule Take 250 mg by mouth daily.   Taking   diphenhydrAMINE  (BENADRYL ) 25 mg capsule Take 1 capsule (25 mg total) by mouth every 6 (six) hours as needed for itching, allergies or sleep. (Patient taking differently: Take 75 mg by mouth at bedtime.) 30 capsule  Taking Differently   EPINEPHrine  0.3 mg/0.3 mL IJ SOAJ injection Inject 0.3 mg into  the muscle as needed for anaphylaxis. 1 each 0 Taking As Needed   famotidine  (PEPCID ) 20 MG tablet Take 1 tablet (20 mg total) by mouth 2 (two) times daily. (Patient taking differently: Take 20 mg by mouth 2 (two) times daily as needed for heartburn or indigestion.) 60 tablet 1 Taking Differently   FLUoxetine  (PROZAC ) 40 MG capsule Take 40 mg by mouth daily.   Taking   levothyroxine  (SYNTHROID ) 125 MCG tablet Take 125 mcg by mouth daily before breakfast.   Taking   loratadine  (CLARITIN ) 10 MG tablet Take 10 mg by mouth in the morning.   Taking   losartan  (COZAAR ) 25 MG tablet Take 25 mg by mouth daily.   Taking   meclizine  (ANTIVERT ) 25 MG tablet Take 25 mg by mouth 3 (three) times daily as needed for dizziness.   Taking As Needed   metFORMIN  (GLUCOPHAGE ) 500 MG tablet 1 tab at lunch 1 tab at dinner (Patient taking differently: Take 500 mg by mouth 2 (two) times daily with a meal.) 180 tablet 0 Taking Differently   montelukast  (SINGULAIR )  10 MG tablet Take 10 mg by mouth at bedtime.   Taking   omeprazole (PRILOSEC) 40 MG capsule Take 40 mg by mouth at bedtime.    Taking   oxyCODONE  (OXY IR/ROXICODONE ) 5 MG immediate release tablet Take 1 tablet (5 mg total) by mouth every 4 (four) hours as needed for severe pain (pain score 7-10). 42 tablet 0 Taking As Needed   polyethylene glycol (MIRALAX  / GLYCOLAX ) 17 g packet Take 17 g by mouth 2 (two) times daily. (Patient taking differently: Take 17 g by mouth 2 (two) times daily as needed for mild constipation (if not using Senna tablets).)   Taking Differently   rosuvastatin  (CRESTOR ) 10 MG tablet Take 10 mg by mouth every evening.   Taking   senna (SENOKOT) 8.6 MG TABS tablet Take 1 tablet by mouth 2 (two) times daily as needed for mild constipation (if not using Miralax ).   Taking As Needed   triamcinolone  cream (KENALOG ) 0.5 % Apply 1 Application topically daily as needed (itching- affected areas).   Unknown   cyclobenzaprine  (FLEXERIL ) 5 MG tablet Take 1-2 tablets (5-10 mg total) by mouth 3 (three) times daily as needed for muscle spasms. (Patient not taking: Reported on 07/02/2024) 40 tablet 1 Not Taking   Misc. Devices MISC DX   Hypertension         RX   Extra Large blood pressure cuff      tirzepatide  (ZEPBOUND ) 5 MG/0.5ML Pen Inject 5 mg into the skin once a week. (Patient not taking: Reported on 07/02/2024) 3 mL 0 Not Taking   Allergies  Allergen Reactions   Bee Venom Hives, Swelling and Other (See Comments)    Welts, too   Lactose Intolerance (Gi) Other (See Comments)    Constipation     Social History   Tobacco Use   Smoking status: Never   Smokeless tobacco: Never  Substance Use Topics   Alcohol use: No    Comment: quit 17 yrs ago    Family History  Problem Relation Age of Onset   Arthritis Mother        spianl stenosis   Atrial fibrillation Mother    Transient ischemic attack Mother    High Cholesterol Mother    Diabetes Father    Heart attack Father        3  stents   Heart disease Father    Atrial fibrillation  Sister    Depression Sister    Transient ischemic attack Maternal Aunt    Stroke Maternal Grandmother    Diabetes Paternal Grandmother      Review of Systems  Constitutional:  Negative for chills and fever.  Respiratory:  Negative for cough and shortness of breath.   Cardiovascular:  Negative for chest pain.  Gastrointestinal:  Negative for nausea and vomiting.  Musculoskeletal:  Positive for arthralgias.     Objective:  Physical Exam Right Knee: Well healing incision with dermabond in place AROM 3-120 No signs of infection  left Knee: No palpable effusion, warmth erythema Slight flexion contracture with flexion of 110 degrees Tenderness over the medial and anterior aspect the knee No lower extremity edema, erythema or calf tenderness  Vital signs in last 24 hours:    Labs:   Estimated body mass index is 37.34 kg/m as calculated from the following:   Height as of 07/02/24: 5' 2 (1.575 m).   Weight as of 07/02/24: 92.6 kg.   Imaging Review Plain radiographs demonstrate severe degenerative joint disease of the left knee(s). Prior unicompartmental hardware in stable positioning.The bone quality appears to be adequate for age and reported activity level.      Assessment/Plan:  End stage arthritis, left knee   The patient history, physical examination, clinical judgment of the provider and imaging studies are consistent with end stage degenerative joint disease of the left knee(s) and total knee arthroplasty is deemed medically necessary. The treatment options including medical management, injection therapy arthroscopy and arthroplasty were discussed at length. The risks and benefits of total knee arthroplasty were presented and reviewed. The risks due to aseptic loosening, infection, stiffness, patella tracking problems, thromboembolic complications and other imponderables were discussed. The patient acknowledged the  explanation, agreed to proceed with the plan and consent was signed. Patient is being admitted for inpatient treatment for surgery, pain control, PT, OT, prophylactic antibiotics, VTE prophylaxis, progressive ambulation and ADL's and discharge planning. The patient is planning to be discharged home.     Patient's anticipated LOS is less than 2 midnights, meeting these requirements: - Younger than 66 - Lives within 1 hour of care - Has a competent adult at home to recover with post-op recover - NO history of  - Chronic pain requiring opiods  - Diabetes  - Coronary Artery Disease  - Heart failure  - Heart attack  - Stroke  - DVT/VTE  - Cardiac arrhythmia  - Respiratory Failure/COPD  - Renal failure  - Anemia  - Advanced Liver disease  Rosina Calin, PA-C Orthopedic Surgery [facility] [place] [phone]

## 2024-07-09 NOTE — Plan of Care (Signed)
   Problem: Coping: Goal: Level of anxiety will decrease Outcome: Progressing   Problem: Pain Managment: Goal: General experience of comfort will improve and/or be controlled Outcome: Progressing   Problem: Safety: Goal: Ability to remain free from injury will improve Outcome: Progressing

## 2024-07-09 NOTE — Interval H&P Note (Signed)
 History and Physical Interval Note:  07/09/2024 1:18 PM  Melissa [name]  has presented today for surgery, with the diagnosis of Left knee osteoarthritis.  The various methods of treatment have been discussed with the patient and family. After consideration of risks, benefits and other options for treatment, the patient has consented to  Procedure(s): ARTHROPLASTY, KNEE, TOTAL (Left) as a surgical intervention.  The patient's history has been reviewed, patient examined, no change in status, stable for surgery.  I have reviewed the patient's chart and labs.  Questions were answered to the patient's satisfaction.     [name]

## 2024-07-09 NOTE — Anesthesia Procedure Notes (Signed)
 Spinal  Patient location during procedure: OR Start time: 07/09/2024 2:24 PM End time: 07/09/2024 2:28 PM Reason for block: surgical anesthesia Staffing Performed: anesthesiologist  Anesthesiologist: Merla Almarie HERO, DO Performed by: Merla Almarie HERO, DO Authorized by: Merla Almarie HERO, DO   Preanesthetic Checklist Completed: patient identified, IV checked, risks and benefits discussed, surgical consent, monitors and equipment checked, pre-op evaluation and timeout performed Spinal Block Patient position: sitting Prep: DuraPrep and site prepped and draped Patient monitoring: cardiac monitor, continuous pulse ox and blood pressure Approach: midline Location: L3-4 Injection technique: single-shot Needle Needle type: Pencan  Needle gauge: 24 G Needle length: 9 cm Assessment Sensory level: T6 Events: CSF return Additional Notes Functioning IV was confirmed and monitors were applied. Sterile prep and drape, including hand hygiene and sterile gloves were used. The patient was positioned and the spine was prepped. The skin was anesthetized with lidocaine .  Free flow of clear CSF was obtained prior to injecting local anesthetic into the CSF.  The spinal needle aspirated freely following injection.  The needle was carefully withdrawn.  The patient tolerated the procedure well.

## 2024-07-09 NOTE — Anesthesia Procedure Notes (Signed)
 Anesthesia Regional Block: Adductor canal block   Pre-Anesthetic Checklist: , timeout performed,  Correct Patient, Correct Site, Correct Laterality,  Correct Procedure, Correct Position, site marked,  Risks and benefits discussed,  Surgical consent,  Pre-op evaluation,  At surgeon's request and post-op pain management  Laterality: Left  Prep: Maximum Sterile Barrier Precautions used, chloraprep       Needles:  Injection technique: Single-shot  Needle Type: Echogenic Stimulator Needle     Needle Length: 9cm  Needle Gauge: 22     Additional Needles:   Procedures:,,,, ultrasound used (permanent image in chart),,    Narrative:  Start time: 07/09/2024 1:30 PM End time: 07/09/2024 1:35 PM Injection made incrementally with aspirations every 5 mL.  Performed by: Personally  Anesthesiologist: Merla Almarie HERO, DO  Additional Notes: Monitors applied. No increased pain on injection. No increased resistance to injection. Injection made in 5cc increments. Good needle visualization. Patient tolerated procedure well.

## 2024-07-09 NOTE — Anesthesia Postprocedure Evaluation (Signed)
 Anesthesia Post Note  Patient: LENNA HAGARTY  Procedure(s) Performed: ARTHROPLASTY, KNEE, TOTAL (Left: Knee)     Patient location during evaluation: PACU Anesthesia Type: Regional, MAC and Spinal Level of consciousness: awake and alert and oriented Pain management: pain level controlled Vital Signs Assessment: post-procedure vital signs reviewed and stable Respiratory status: spontaneous breathing, nonlabored ventilation and respiratory function stable Cardiovascular status: blood pressure returned to baseline and stable Postop Assessment: no headache, no backache, spinal receding and patient able to bend at knees Anesthetic complications: no   No notable events documented.  Last Vitals:  Vitals:   07/09/24 1730 07/09/24 1800  BP: (!) 150/76 (!) 154/76  Pulse: 92 93  Resp: 20 18  Temp:    SpO2: 99% 100%    Last Pain:  Vitals:   07/09/24 1809  TempSrc:   PainSc: 4                  Almarie CHRISTELLA Marchi

## 2024-07-10 ENCOUNTER — Encounter (HOSPITAL_COMMUNITY): Payer: Self-pay | Admitting: Orthopedic Surgery

## 2024-07-10 DIAGNOSIS — M1712 Unilateral primary osteoarthritis, left knee: Secondary | ICD-10-CM | POA: Diagnosis not present

## 2024-07-10 LAB — BASIC METABOLIC PANEL WITH GFR
Anion gap: 12 (ref 5–15)
BUN: 21 mg/dL — ABNORMAL HIGH (ref 6–20)
CO2: 23 mmol/L (ref 22–32)
Calcium: 9.4 mg/dL (ref 8.9–10.3)
Chloride: 106 mmol/L (ref 98–111)
Creatinine, Ser: 0.8 mg/dL (ref 0.44–1.00)
GFR, Estimated: >60 mL/min (ref >60)
Glucose, Bld: 162 mg/dL — ABNORMAL HIGH (ref 70–99)
Potassium: 5.2 mmol/L — ABNORMAL HIGH (ref 3.5–5.1)
Sodium: 141 mmol/L (ref 135–145)

## 2024-07-10 LAB — GLUCOSE, CAPILLARY
Glucose-Capillary: 121 mg/dL — ABNORMAL HIGH (ref 70–99)
Glucose-Capillary: 152 mg/dL — ABNORMAL HIGH (ref 70–99)

## 2024-07-10 LAB — CBC
HCT: 30.8 % — ABNORMAL LOW (ref 36.0–46.0)
Hemoglobin: 9.3 g/dL — ABNORMAL LOW (ref 12.0–15.0)
MCH: 26.6 pg (ref 26.0–34.0)
MCHC: 30.2 g/dL (ref 30.0–36.0)
MCV: 88 fL (ref 80.0–100.0)
Platelets: 391 K/uL (ref 150–400)
RBC: 3.5 MIL/uL — ABNORMAL LOW (ref 3.87–5.11)
RDW: 14.6 % (ref 11.5–15.5)
WBC: 12.3 K/uL — ABNORMAL HIGH (ref 4.0–10.5)
nRBC: 0 % (ref 0.0–0.2)

## 2024-07-10 LAB — HEMOGLOBIN A1C
Hgb A1c MFr Bld: 4.9 % (ref 4.8–5.6)
Mean Plasma Glucose: 93.93 mg/dL

## 2024-07-10 MED ORDER — APIXABAN 2.5 MG PO TABS
2.5000 mg | ORAL_TABLET | Freq: Two times a day (BID) | ORAL | Status: DC
Start: 2024-07-10 — End: 2024-07-10

## 2024-07-10 MED ORDER — CYCLOBENZAPRINE HCL 5 MG PO TABS: 5.0000 mg | ORAL_TABLET | Freq: Three times a day (TID) | ORAL | 1 refills | Status: DC | PRN

## 2024-07-10 MED ORDER — APIXABAN 2.5 MG PO TABS: 2.5000 mg | ORAL_TABLET | Freq: Two times a day (BID) | ORAL | 0 refills | Status: DC

## 2024-07-10 MED ORDER — OXYCODONE HCL 5 MG PO TABS: 5.0000 mg | ORAL_TABLET | ORAL | 0 refills | Status: DC | PRN

## 2024-07-10 MED ORDER — CELECOXIB 200 MG PO CAPS: 200.0000 mg | ORAL_CAPSULE | Freq: Two times a day (BID) | ORAL | 0 refills | Status: DC

## 2024-07-10 NOTE — Plan of Care (Signed)
   Problem: Activity: Goal: Risk for activity intolerance will decrease Outcome: Progressing   Problem: Pain Managment: Goal: General experience of comfort will improve and/or be controlled Outcome: Progressing   Problem: Safety: Goal: Ability to remain free from injury will improve Outcome: Progressing

## 2024-07-10 NOTE — Progress Notes (Signed)
 Physical Therapy Treatment Patient Details Name: Melissa [name] MRN: [id number] DOB: 11-10-1965 Today's Date: 07/10/2024   History of Present Illness Pt is a 58 y.o. female s/p  LTKA PMH: ,R reverse revision lateralized total shoulder arthroplasty, arthritis, fatty liver, GERD, HTN, hypothyroidism, OSA, osteoarthritis, pre-diabetes, tinnitis, vertigo.anxiety, depression, uni knee replacement conversion  to TKA 8/25    PT Comments   Patient has concerns about her pain medication and flexeril , deferred to RN. Patient cotinues to rate pain 8/10 with pain med and flexeril . Patient did ambulate and practice steps. Continue PT  until pain is controlled for Dc.    If plan is discharge home, recommend the following: A little help with walking and/or transfers;A little help with bathing/dressing/bathroom;Assistance with cooking/housework;Help with stairs or ramp for entrance   Can travel by private vehicle        Equipment Recommendations  None recommended by PT    Recommendations for Other Services       Precautions / Restrictions Precautions Precautions: Knee;Fall Restrictions LLE Weight Bearing Per Provider Order: Weight bearing as tolerated     Mobility  Bed Mobility Overal bed mobility: Modified Independent                  Transfers Overall transfer level: Modified independent Equipment used: Rolling walker (2 wheels) Transfers: Sit to/from Stand Sit to Stand: Supervision                Ambulation/Gait Ambulation/Gait assistance: Supervision Gait Distance (Feet): 80 Feet Assistive device: Rolling walker (2 wheels) Gait Pattern/deviations: Step-to pattern, Step-through pattern       General Gait Details: gait steady and  smoothe   Stairs Stairs: Yes Stairs assistance: Contact guard assist Stair Management: One rail Right, Step to pattern, With cane, Forwards Number of Stairs: 2 General stair comments: cues for sequence, steady no  buckling   Wheelchair Mobility     Tilt Bed    Modified Rankin (Stroke Patients Only)       Balance Overall balance assessment: Mild deficits observed, not formally tested                                          Communication Communication Communication: No apparent difficulties  Cognition Arousal: Alert Behavior During Therapy: WFL for tasks assessed/performed   PT - Cognitive impairments: No apparent impairments                         Following commands: Intact      Cueing    Exercises   General Comments        Pertinent Vitals/Pain Pain Assessment Pain Score: 8  Pain Location: left knee Pain Descriptors / Indicators: Tender, Grimacing Pain Intervention(s): Monitored during session, Premedicated before session, Ice applied    Home Living Family/patient expects to be discharged to:: Private residence Living Arrangements: Non-relatives/Friends Available Help at Discharge: Friend(s);Available 24 hours/day Type of Home: House Home Access: Stairs to enter Entrance Stairs-Rails: Doctor, general practice of Steps: 4   Home Layout: One level Home Equipment: Cane - single Librarian, academic (2 wheels) Additional Comments: has multiple friends to help    Prior Function            PT Goals (current goals can now be found in the care plan section) Acute Rehab PT Goals Patient Stated Goal: home PT Goal Formulation: With  patient Time For Goal Achievement: 07/17/24 Potential to Achieve Goals: Good Progress towards PT goals: Progressing toward goals    Frequency    7X/week      PT Plan      Co-evaluation              AM-PAC PT 6 Clicks Mobility   Outcome Measure  Help needed turning from your back to your side while in a flat bed without using bedrails?: None Help needed moving from lying on your back to sitting on the side of a flat bed without using bedrails?: None Help needed moving to and from a  bed to a chair (including a wheelchair)?: None Help needed standing up from a chair using your arms (e.g., wheelchair or bedside chair)?: A Little Help needed to walk in hospital room?: A Little Help needed climbing 3-5 steps with a railing? : A Little 6 Click Score: 21    End of Session Equipment Utilized During Treatment: Gait belt Activity Tolerance: Patient tolerated treatment well;Patient limited by pain Patient left: in bed;with call bell/phone within reach Nurse Communication: Mobility status PT Visit Diagnosis: Unsteadiness on feet (R26.81);Muscle weakness (generalized) (M62.81);Pain Pain - Right/Left: Left Pain - part of body: Knee     Time: 1415-1440 PT Time Calculation (min) (ACUTE ONLY): 25 min  Charges:    $Gait Training: 23-37 mins PT General Charges $$ ACUTE PT VISIT: 1 Visit                    Darice Potters PT Acute Rehabilitation Services Office [phone]    Potters Darice Norris 07/10/2024, 2:57 PM

## 2024-07-10 NOTE — TOC Transition Note (Signed)
 Transition of Care [facility]) - Discharge Note   Patient Details  Name: Melissa [name] MRN: [id number] Date of Birth: 1965/11/21  Transition of [facility]) CM/SW Contact:  Alfonse JONELLE Rex, RN Phone Number: 07/10/2024, 11:57 AM   Clinical Narrative:  Met with patient at bedside to review dc therapy and home equipment needs, pt confirmed OPPT w/[facility], has RW. MOON completed. No TOC needs.      Final next level of care: OP Rehab Barriers to Discharge: No Barriers Identified   Patient Goals and CMS Choice Patient states their goals for this hospitalization and ongoing recovery are:: return home          Discharge Placement                       Discharge Plan and Services Additional resources added to the After Visit Summary for                                       Social Drivers of Health (SDOH) Interventions SDOH Screenings   Food Insecurity: No Food Insecurity (07/09/2024)  Housing: Low Risk  (07/09/2024)  Transportation Needs: No Transportation Needs (07/09/2024)  Utilities: Not At Risk (07/09/2024)  Depression (PHQ2-9): Medium Risk (11/18/2019)  Financial Resource Strain: Medium Risk (03/23/2024)   Received from [facility]  Physical Activity: Insufficiently Active (03/23/2024)   Received from [facility]  Social Connections: Socially Integrated (03/23/2024)   Received from [facility]  Stress: Stress Concern Present (03/23/2024)   Received from [facility]  Tobacco Use: Low Risk  (07/09/2024)     Readmission Risk Interventions     No data to display

## 2024-07-10 NOTE — Evaluation (Signed)
 Physical Therapy Evaluation Patient Details Name: Melissa [name] MRN: [id number] DOB: 1966/02/21 Today's Date: 07/10/2024  History of Present Illness  Pt is a 58 y.o. female s/p  LTKA PMH: ,R reverse revision lateralized total shoulder arthroplasty, arthritis, fatty liver, GERD, HTN, hypothyroidism, OSA, osteoarthritis, pre-diabetes, tinnitis, vertigo.anxiety, depression, uni knee replacement conversion  to TKA 8/25  Clinical Impression   Patient reporting 8/10 left knee pain and thigh pain. Patient did  ambulate 50', requested muscle relaxer. Patient  will practice steps next visit and then should be ready for DC if pain is well controlled.    Pt admitted with above diagnosis.  Pt currently with functional limitations due to the deficits listed below (see PT Problem List). Pt will benefit from acute skilled PT to increase their independence and safety with mobility to allow discharge.       If plan is discharge home, recommend the following: A little help with walking and/or transfers;A little help with bathing/dressing/bathroom;Assistance with cooking/housework;Help with stairs or ramp for entrance   Can travel by private vehicle        Equipment Recommendations None recommended by PT  Recommendations for Other Services       Functional Status Assessment Patient has had a recent decline in their functional status and demonstrates the ability to make significant improvements in function in a reasonable and predictable amount of time.     Precautions / Restrictions Precautions Precautions: Knee;Fall Restrictions LLE Weight Bearing Per Provider Order: Weight bearing as tolerated      Mobility  Bed Mobility Overal bed mobility: Modified Independent                  Transfers   Equipment used: Rolling walker (2 wheels) Transfers: Sit to/from Stand Sit to Stand: Supervision                Ambulation/Gait Ambulation/Gait assistance: Supervision Gait Distance  (Feet): 50 Feet Assistive device: Rolling walker (2 wheels) Gait Pattern/deviations: Step-to pattern, Step-through pattern, Antalgic       General Gait Details: gait steady and  smoothe  Stairs            Wheelchair Mobility     Tilt Bed    Modified Rankin (Stroke Patients Only)       Balance Overall balance assessment: Mild deficits observed, not formally tested                                           Pertinent Vitals/Pain Pain Assessment Pain Score: 8  Pain Location: left knee Pain Descriptors / Indicators: Tender, Grimacing Pain Intervention(s): Monitored during session, Premedicated before session, Patient requesting pain meds-RN notified, Ice applied, Heat applied    Home Living Family/patient expects to be discharged to:: Private residence Living Arrangements: Non-relatives/Friends Available Help at Discharge: Friend(s);Available 24 hours/day Type of Home: House Home Access: Stairs to enter Entrance Stairs-Rails: Doctor, general practice of Steps: 4   Home Layout: One level Home Equipment: Cane - single Librarian, academic (2 wheels) Additional Comments: has multiple friends to help    Prior Function Prior Level of Function : Independent/Modified Independent;Driving             Mobility Comments: intermittent cane based on terrian       Extremity/Trunk Assessment   Upper Extremity Assessment Upper Extremity Assessment: Overall WFL for tasks assessed    Lower Extremity Assessment  Lower Extremity Assessment: LLE deficits/detail RLE Deficits / Details: lnee flex to  60, + SLR       Communication   Communication Communication: No apparent difficulties    Cognition Arousal: Alert Behavior During Therapy: WFL for tasks assessed/performed   PT - Cognitive impairments: No apparent impairments                         Following commands: Intact       Cueing       General Comments       Exercises Total Joint Exercises Quad Sets: AROM, Both Heel Slides: AROM, Left, 10 reps   Assessment/Plan    PT Assessment Patient needs continued PT services  PT Problem List Decreased strength;Decreased cognition;Decreased range of motion;Decreased knowledge of use of DME;Decreased activity tolerance;Decreased knowledge of precautions;Decreased mobility;Pain       PT Treatment Interventions DME instruction;Gait training;Stair training;Patient/family education;Functional mobility training;Therapeutic activities;Therapeutic exercise    PT Goals (Current goals can be found in the Care Plan section)  Acute Rehab PT Goals Patient Stated Goal: home PT Goal Formulation: With patient Time For Goal Achievement: 07/17/24 Potential to Achieve Goals: Good    Frequency 7X/week     Co-evaluation               AM-PAC PT 6 Clicks Mobility  Outcome Measure Help needed turning from your back to your side while in a flat bed without using bedrails?: None Help needed moving from lying on your back to sitting on the side of a flat bed without using bedrails?: None Help needed moving to and from a bed to a chair (including a wheelchair)?: A Little Help needed standing up from a chair using your arms (e.g., wheelchair or bedside chair)?: A Little Help needed to walk in hospital room?: A Little Help needed climbing 3-5 steps with a railing? : A Little 6 Click Score: 20    End of Session Equipment Utilized During Treatment: Gait belt Activity Tolerance: Patient tolerated treatment well;Patient limited by pain Patient left: in bed;with call bell/phone within reach Nurse Communication: Mobility status PT Visit Diagnosis: Unsteadiness on feet (R26.81);Muscle weakness (generalized) (M62.81);Pain Pain - Right/Left: Left Pain - part of body: Knee    Time: 8891-8864 PT Time Calculation (min) (ACUTE ONLY): 27 min   Charges:   PT Evaluation $PT Eval Low Complexity: 1 Low PT  Treatments $Gait Training: 8-22 mins PT General Charges $$ ACUTE PT VISIT: 1 Visit         Darice Potters PT Acute Rehabilitation Services Office [phone]   Potters Darice Norris 07/10/2024, 1:48 PM

## 2024-07-10 NOTE — Care Management Obs Status (Signed)
 MEDICARE OBSERVATION STATUS NOTIFICATION   Patient Details  Name: Melissa [name] MRN: [id number] Date of Birth: [birth year]   Medicare Observation Status Notification Given:  Yes    Alfonse JONELLE Rex, RN 07/10/2024, 10:44 AM

## 2024-07-10 NOTE — Progress Notes (Signed)
 Subjective: 1 Day Post-Op Procedure(s) (LRB): ARTHROPLASTY, KNEE, TOTAL (Left) Patient reports pain as mild.   Patient seen in rounds for Dr. Ernie. Patient is well, and has had no acute complaints or problems. No acute events overnight. Foley catheter removed. Patient has not been up with PT yet.  We will start therapy today.   Objective: Vital signs in last 24 hours: Temp:  [97.8 F (36.6 C)-98.5 F (36.9 C)] 98.2 F (36.8 C) (09/17 0634) Pulse Rate:  [75-94] 84 (09/17 0634) Resp:  [12-23] 16 (09/17 0634) BP: (108-154)/(65-94) 108/65 (09/17 0634) SpO2:  [96 %-100 %] 100 % (09/17 0634) Weight:  [92.6 kg] 92.6 kg (09/16 1955)  Intake/Output from previous day:  Intake/Output Summary (Last 24 hours) at 07/10/2024 0843 Last data filed at 07/10/2024 0600 Gross per 24 hour  Intake 2807.22 ml  Output 1950 ml  Net 857.22 ml     Intake/Output this shift: No intake/output data recorded.  Labs: Recent Labs    07/10/24 0348  HGB 9.3*   Recent Labs    07/10/24 0348  WBC 12.3*  RBC 3.50*  HCT 30.8*  PLT 391   Recent Labs    07/10/24 0348  NA 141  K 5.2*  CL 106  CO2 23  BUN 21*  CREATININE 0.80  GLUCOSE 162*  CALCIUM  9.4   No results for input(s): LABPT, INR in the last 72 hours.  Exam: General - Patient is Alert and Oriented Extremity - Neurologically intact Sensation intact distally Intact pulses distally Dorsiflexion/Plantar flexion intact Dressing - dressing C/D/I Motor Function - intact, moving foot and toes well on exam.   Past Medical History:  Diagnosis Date   Allergic rhinitis    Anemia    Anxiety    Arthritis    Back pain    Bronchitis    Constipated    Depression    Fatty liver    GERD (gastroesophageal reflux disease)    History of hiatal hernia    History of kidney stones    Hypertension    Hypothyroidism    IBS (irritable bowel syndrome)    Joint pain    Kidney problem    one functioning kidney due to left renal atrophy    Lactose intolerance    Morbid obesity (HCC)    OSA (obstructive sleep apnea)    No CPAP since she lost weight   Osteoarthritis    Pre-diabetes    Seasonal allergies    Tinnitus    Vertigo    Vitamin D  deficiency     Assessment/Plan: 1 Day Post-Op Procedure(s) (LRB): ARTHROPLASTY, KNEE, TOTAL (Left) Principal Problem:   S/P total knee arthroplasty, left  Estimated body mass index is 37.34 kg/m as calculated from the following:   Height as of this encounter: 5' 2 (1.575 m).   Weight as of this encounter: 92.6 kg. Advance diet Up with therapy D/C IV fluids   Patient's anticipated LOS is less than 2 midnights, meeting these requirements: - Younger than 54 - Lives within 1 hour of care - Has a competent adult at home to recover with post-op recover - NO history of  - Chronic pain requiring opiods  - Diabetes  - Coronary Artery Disease  - Heart failure  - Heart attack  - Stroke  - DVT/VTE  - Cardiac arrhythmia  - Respiratory Failure/COPD  - Renal failure  - Anemia  - Advanced Liver disease     DVT Prophylaxis - Eliquis  Weight bearing as tolerated.  We discussed plans for using Eliquis  for post op DVT ppx per PCP recommendations due to her recent COVID infection Had aspirin  this morning so will start this evening  Hgb stable at 9.3 this AM  Plan is to go Home after hospital stay. Plan for discharge today following 1-2 sessions of PT as long as they are meeting their goals. Patient is scheduled for OPPT. Follow up in the office in 2 weeks.   Rosina Calin, PA-C Orthopedic Surgery [phone] 07/10/2024, 8:43 AM

## 2024-07-10 NOTE — Progress Notes (Signed)
Discharge package printed and instructions given to pt. Verbalizes understanding.

## 2024-07-10 NOTE — Progress Notes (Signed)
 PT Cancellation Note  Patient Details Name: Melissa [name] MRN: [id number] DOB: 1966-01-10   Cancelled Treatment:    Reason Eval/Treat Not Completed: Pain limiting ability to participate Patient requesting  a Flexeril , RN notified. Pain reported 8/10 L knee. Will check back later. Darice Potters PT Acute Rehabilitation Services Office [phone]    Potters Darice Norris 07/10/2024, 10:22 AM

## 2024-07-14 NOTE — Discharge Summary (Signed)
 Patient ID: Melissa [name] MRN: [id number] DOB/AGE: Aug 06, 1966 58 y.o.  Admit date: 07/09/2024 Discharge date: 07/10/2024  Admission Diagnoses:  Left knee osteoarthritis  Discharge Diagnoses:  Principal Problem:   S/P total knee arthroplasty, left   Past Medical History:  Diagnosis Date   Allergic rhinitis    Anemia    Anxiety    Arthritis    Back pain    Bronchitis    Constipated    Depression    Fatty liver    GERD (gastroesophageal reflux disease)    History of hiatal hernia    History of kidney stones    Hypertension    Hypothyroidism    IBS (irritable bowel syndrome)    Joint pain    Kidney problem    one functioning kidney due to left renal atrophy   Lactose intolerance    Morbid obesity (HCC)    OSA (obstructive sleep apnea)    No CPAP since she lost weight   Osteoarthritis    Pre-diabetes    Seasonal allergies    Tinnitus    Vertigo    Vitamin D  deficiency     Surgeries: Procedure(s): ARTHROPLASTY, KNEE, TOTAL on 07/09/2024   Consultants:   Discharged Condition: Improved  Hospital Course: GABRIANA WILMOTT is an 58 y.o. female who was admitted 07/09/2024 for operative treatment ofS/P total knee arthroplasty, left. Patient has severe unremitting pain that affects sleep, daily activities, and work/hobbies. After pre-op clearance the patient was taken to the operating room on 07/09/2024 and underwent  Procedure(s): ARTHROPLASTY, KNEE, TOTAL.    Patient was given perioperative antibiotics:  Anti-infectives (From admission, onward)    Start     Dose/Rate Route Frequency Ordered Stop   07/10/24 1000  cephALEXin  (KEFLEX ) capsule 250 mg  Status:  Discontinued        250 mg Oral Daily 07/09/24 1949 07/10/24 2133   07/09/24 2100  ceFAZolin  (ANCEF ) IVPB 2g/100 mL premix        2 g 200 mL/hr over 30 Minutes Intravenous Every 6 hours 07/09/24 1949 07/10/24 0419   07/09/24 1230  ceFAZolin  (ANCEF ) IVPB 2g/100 mL premix        2 g 200 mL/hr over 30 Minutes  Intravenous On call to O.R. 07/09/24 1215 07/09/24 1503        Patient was given sequential compression devices, early ambulation, and chemoprophylaxis to prevent DVT. Patient worked with PT and was meeting their goals regarding safe ambulation and transfers.  Patient benefited maximally from hospital stay and there were no complications.    Recent vital signs: No data found.   Recent laboratory studies: No results for input(s): WBC, HGB, HCT, PLT, NA, K, CL, CO2, BUN, CREATININE, GLUCOSE, INR, CALCIUM  in the last 72 hours.  Invalid input(s): PT, 2   Discharge Medications:   Allergies as of 07/10/2024       Reactions   Bee Venom Hives, Swelling, Other (See Comments)   Welts, too   Lactose Intolerance (gi) Other (See Comments)   Constipation        Medication List     STOP taking these medications    aspirin  EC 81 MG tablet       TAKE these medications    acetaminophen  500 MG tablet Commonly known as: TYLENOL  Take 2 tablets (1,000 mg total) by mouth every 6 (six) hours as needed (pain). What changed:  how much to take when to take this   apixaban  2.5 MG Tabs tablet Commonly known as: ELIQUIS   Take 1 tablet (2.5 mg total) by mouth 2 (two) times daily for 21 days. Then take aspirin  81 mg BID for 3 more weeks   Artificial Tears ophthalmic solution Place 1 drop into both eyes 4 (four) times daily as needed (for irritation).   Breyna 80-4.5 MCG/ACT inhaler Generic drug: budesonide-formoterol  Inhale 2 puffs into the lungs 2 (two) times daily as needed (for respiratory flares).   BUDEPRION XL PO Take 300 mg by mouth in the morning.   celecoxib  200 MG capsule Commonly known as: CELEBREX  Take 1 capsule (200 mg total) by mouth 2 (two) times daily. Do not start until finished with Eliquis    cephALEXin  250 MG capsule Commonly known as: KEFLEX  Take 250 mg by mouth daily.   cyclobenzaprine  5 MG tablet Commonly known as: FLEXERIL  Take  1-2 tablets (5-10 mg total) by mouth 3 (three) times daily as needed for muscle spasms.   Benadryl  Allergy 25 MG tablet Generic drug: diphenhydrAMINE  Take 75 mg by mouth at bedtime. What changed: Another medication with the same name was changed. Make sure you understand how and when to take each.   diphenhydrAMINE  25 mg capsule Commonly known as: BENADRYL  Take 1 capsule (25 mg total) by mouth every 6 (six) hours as needed for itching, allergies or sleep. What changed:  how much to take when to take this   EPINEPHrine  0.3 mg/0.3 mL Soaj injection Commonly known as: EPI-PEN Inject 0.3 mg into the muscle as needed for anaphylaxis.   famotidine  20 MG tablet Commonly known as: PEPCID  Take 1 tablet (20 mg total) by mouth 2 (two) times daily. What changed:  when to take this reasons to take this   FLUoxetine  40 MG capsule Commonly known as: PROZAC  Take 40 mg by mouth daily.   levothyroxine  125 MCG tablet Commonly known as: SYNTHROID  Take 125 mcg by mouth daily before breakfast.   loratadine  10 MG tablet Commonly known as: CLARITIN  Take 10 mg by mouth in the morning.   losartan  25 MG tablet Commonly known as: COZAAR  Take 25 mg by mouth daily.   meclizine  25 MG tablet Commonly known as: ANTIVERT  Take 25 mg by mouth 3 (three) times daily as needed for dizziness.   metFORMIN  500 MG tablet Commonly known as: GLUCOPHAGE  1 tab at lunch 1 tab at dinner What changed:  how much to take how to take this when to take this additional instructions   Misc. Devices Misc DX   Hypertension         RX   Extra Large blood pressure cuff   montelukast  10 MG tablet Commonly known as: SINGULAIR  Take 10 mg by mouth at bedtime.   omeprazole 40 MG capsule Commonly known as: PRILOSEC Take 40 mg by mouth at bedtime.   oxyCODONE  5 MG immediate release tablet Commonly known as: Oxy IR/ROXICODONE  Take 1 tablet (5 mg total) by mouth every 4 (four) hours as needed for severe pain (pain  score 7-10).   polyethylene glycol 17 g packet Commonly known as: MIRALAX  / GLYCOLAX  Take 17 g by mouth 2 (two) times daily. What changed:  when to take this reasons to take this   rosuvastatin  10 MG tablet Commonly known as: CRESTOR  Take 10 mg by mouth every evening.   senna 8.6 MG Tabs tablet Commonly known as: SENOKOT Take 1 tablet by mouth 2 (two) times daily as needed for mild constipation (if not using Miralax ).   triamcinolone  cream 0.5 % Commonly known as: KENALOG  Apply 1 Application topically daily  as needed (itching- affected areas).   Zepbound  5 MG/0.5ML Pen Generic drug: tirzepatide  Inject 5 mg into the skin once a week.               Discharge Care Instructions  (From admission, onward)           Start     Ordered   07/10/24 0000  Change dressing       Comments: Maintain surgical dressing until follow up in the clinic. If the edges start to pull up, may reinforce with tape. If the dressing is no longer working, may remove and cover with gauze and tape, but must keep the area dry and clean.  Call with any questions or concerns.   07/10/24 0845            Diagnostic Studies: No results found.  Disposition: Discharge disposition: 01-Home or Self Care       Discharge Instructions     Call MD / Call 911   Complete by: As directed    If you experience chest pain or shortness of breath, CALL 911 and be transported to the hospital emergency room.  If you develope a fever above 101 F, pus (white drainage) or increased drainage or redness at the wound, or calf pain, call your surgeon's office.   Change dressing   Complete by: As directed    Maintain surgical dressing until follow up in the clinic. If the edges start to pull up, may reinforce with tape. If the dressing is no longer working, may remove and cover with gauze and tape, but must keep the area dry and clean.  Call with any questions or concerns.   Constipation Prevention   Complete by:  As directed    Drink plenty of fluids.  Prune juice may be helpful.  You may use a stool softener, such as Colace (over the counter) 100 mg twice a day.  Use MiraLax  (over the counter) for constipation as needed.   Diet - low sodium heart healthy   Complete by: As directed    Increase activity slowly as tolerated   Complete by: As directed    Weight bearing as tolerated with assist device (walker, cane, etc) as directed, use it as long as suggested by your surgeon or therapist, typically at least 4-6 weeks.   Post-operative opioid taper instructions:   Complete by: As directed    POST-OPERATIVE OPIOID TAPER INSTRUCTIONS: It is important to wean off of your opioid medication as soon as possible. If you do not need pain medication after your surgery it is ok to stop day one. Opioids include: Codeine , Hydrocodone (Norco, Vicodin), Oxycodone (Percocet, oxycontin ) and hydromorphone  amongst others.  Long term and even short term use of opiods can cause: Increased pain response Dependence Constipation Depression Respiratory depression And more.  Withdrawal symptoms can include Flu like symptoms Nausea, vomiting And more Techniques to manage these symptoms Hydrate well Eat regular healthy meals Stay active Use relaxation techniques(deep breathing, meditating, yoga) Do Not substitute Alcohol to help with tapering If you have been on opioids for less than two weeks and do not have pain than it is ok to stop all together.  Plan to wean off of opioids This plan should start within one week post op of your joint replacement. Maintain the same interval or time between taking each dose and first decrease the dose.  Cut the total daily intake of opioids by one tablet each day Next start to increase the time between doses.  The last dose that should be eliminated is the evening dose.      TED hose   Complete by: As directed    Use stockings (TED hose) for 2 weeks on both leg(s).  You may remove  them at night for sleeping.        Follow-up Information     Ernie Cough, MD. Schedule an appointment as soon as possible for a visit in 2 week(s).   Specialty: Orthopedic Surgery Contact information: [address] [place] [address] [phone]                  Signed: TULEEN MANDELBAUM 07/14/2024, 9:02 AM

## 2024-07-18 ENCOUNTER — Telehealth (INDEPENDENT_AMBULATORY_CARE_PROVIDER_SITE_OTHER): Admitting: Adult Health

## 2024-07-18 DIAGNOSIS — I10 Essential (primary) hypertension: Secondary | ICD-10-CM | POA: Diagnosis not present

## 2024-07-18 DIAGNOSIS — E66813 Obesity, class 3: Secondary | ICD-10-CM

## 2024-07-18 DIAGNOSIS — R7303 Prediabetes: Secondary | ICD-10-CM | POA: Diagnosis not present

## 2024-07-18 DIAGNOSIS — E669 Obesity, unspecified: Secondary | ICD-10-CM | POA: Diagnosis not present

## 2024-07-18 DIAGNOSIS — G4733 Obstructive sleep apnea (adult) (pediatric): Secondary | ICD-10-CM | POA: Diagnosis not present

## 2024-07-18 DIAGNOSIS — Z96652 Presence of left artificial knee joint: Secondary | ICD-10-CM

## 2024-07-18 DIAGNOSIS — Z6837 Body mass index (BMI) 37.0-37.9, adult: Secondary | ICD-10-CM

## 2024-07-18 NOTE — Progress Notes (Signed)
 WEIGHT SUMMARY AND BIOMETRICS  No data recorded No data recorded No data recorded No data recorded  Chief Complaint:  I connected with  Melissa [name] on 07/18/24 by a video and audio enabled telemedicine application and verified that I am speaking with the correct person using two identifiers.  Patient Location: Home  Provider Location: Office/Clinic  I discussed the limitations of evaluation and management by telemedicine. The patient expressed understanding and agreed to proceed.  OBESITY Melissa [name] is here to discuss her progress with her obesity treatment plan. She is on the the Category 3 Plan and states she is following her eating plan approximately 75 % of the time.  She states she is exercising Physical Therapy at home in Out Patient for L TKR   Interim History:  Reported home weight 206 lbs which equates to BMI 37.7 She underwent successful L TKR on 07/09/2024 She is on home stable with support of local friends/neighbors  She held her weekly Zepbound  5mg  one week prior to joint replacement surgery and resumed on 07/14/2024 Denies mass in neck, dysphagia, dyspepsia, persistent hoarseness, abdominal pain, or N/V/C   She is attending Out Patient PT 2 x week and home exercises She has been able to ambulate safely at home without walker She will use her cane when attending PT today Her neighbor Ronal will drive her to and from PT   Subjective:   1. S/P total knee arthroplasty, left Surgeries: Procedure(s): ARTHROPLASTY, KNEE, TOTAL on 07/09/2024   Consultants:    Discharged Condition: Improved   Hospital Course: Melissa [name] is an 58 y.o. female who was admitted 07/09/2024 for operative treatment ofS/P total knee arthroplasty, left. Patient has severe unremitting pain that affects sleep, daily activities, and work/hobbies. After pre-op clearance the patient was taken to the operating room on 07/09/2024 and underwent  Procedure(s): ARTHROPLASTY, KNEE, TOTAL.      Patient was given perioperative antibiotics:  Anti-infectives (From admission, onward)        Start     Dose/Rate Route Frequency Ordered Stop    07/10/24 1000   cephALEXin  (KEFLEX ) capsule 250 mg  Status:  Discontinued        250 mg Oral Daily 07/09/24 1949 07/10/24 2133    07/09/24 2100   ceFAZolin  (ANCEF ) IVPB 2g/100 mL premix        2 g 200 mL/hr over 30 Minutes Intravenous Every 6 hours 07/09/24 1949 07/10/24 0419    07/09/24 1230   ceFAZolin  (ANCEF ) IVPB 2g/100 mL premix        2 g 200 mL/hr over 30 Minutes Intravenous On call to O.R. 07/09/24 1215 07/09/24 1503             Patient was given sequential compression devices, early ambulation, and chemoprophylaxis to prevent DVT. Patient worked with PT and was meeting their goals regarding safe ambulation and transfers.   Patient benefited maximally from hospital stay and there were no complications.    2. Hypertension, unspecified type Last home BP check was yesteraday, reading: 141/78 She reports current 5/10 L Knee pain She denies CP with exertion Pt did not appear in any distress over video visit  3. Pre-diabetes Lab Results  Component Value Date   HGBA1C 4.9 07/10/2024   HGBA1C 5.5 12/11/2023   HGBA1C 5.9 (H) 07/13/2023    She is on daily Metformin  5000mg - 1 tab at lunch and 1 tab at dinner She is also on weekly Zepbound  5mg  She held her weekly Zepbound   5mg  one week prior to joint replacement surgery and resumed on 07/14/2024 Denies mass in neck, dysphagia, dyspepsia, persistent hoarseness, abdominal pain, or N/V/C   4. OSA (obstructive sleep apnea) She resumed weekly Zepbpund 5mg  on 07/14/2024 Denies mass in neck, dysphagia, dyspepsia, persistent hoarseness, abdominal pain, or N/V/C   Assessment/Plan:   1. S/P total knee arthroplasty, left Continue healthy eating, remain well hydrated, and advance activity as tolerated and per her Orthopedic Care Team  2. Hypertension, unspecified type (Primary) Continue  healthy eating, remain well hydrated, and advance activity as tolerated and per her Orthopedic Care Team  3. Pre-diabetes Continue healthy eating, remain well hydrated, and advance activity as tolerated and per her Orthopedic Care Team  4. OSA (obstructive sleep apnea) Continue healthy eating, remain well hydrated, and advance activity as tolerated and per her Orthopedic Care Team  5. Obesity, current BMI 37.7  [name] is currently in the action stage of change. As such, her goal is to maintain weight for now. She has agreed to the Category 3 Plan.   Exercise goals: Activity per PT and Orthopedic Surgeon  Behavioral modification strategies: increasing lean protein intake, decreasing simple carbohydrates, increasing vegetables, increasing water  intake, meal planning and cooking strategies, keeping healthy foods in the home, ways to avoid boredom eating, and planning for success.  [name] has agreed to follow-up with our clinic in 4 weeks. She was informed of the importance of frequent follow-up visits to maximize her success with intensive lifestyle modifications for her multiple health conditions.   Objective:   Last menstrual period 10/12/2012. There is no height or weight on file to calculate BMI.  General: Cooperative, alert, well developed, in no acute distress. HEENT: Conjunctivae and lids unremarkable. Cardiovascular: Regular rhythm.  Lungs: Normal work of breathing. Neurologic: No focal deficits.   Lab Results  Component Value Date   CREATININE 0.80 07/10/2024   BUN 21 (H) 07/10/2024   NA 141 07/10/2024   K 5.2 (H) 07/10/2024   CL 106 07/10/2024   CO2 23 07/10/2024   Lab Results  Component Value Date   ALT 8 07/02/2024   AST 15 07/02/2024   ALKPHOS 127 (H) 07/02/2024   BILITOT <0.2 07/02/2024   Lab Results  Component Value Date   HGBA1C 4.9 07/10/2024   HGBA1C 5.5 12/11/2023   HGBA1C 5.9 (H) 07/13/2023   HGBA1C 5.8 (H) 01/04/2023   HGBA1C 5.7 (H) 05/05/2022    Lab Results  Component Value Date   INSULIN  9.2 12/11/2023   INSULIN  14.1 07/13/2023   INSULIN  14.0 01/04/2023   INSULIN  10.6 05/05/2022   INSULIN  8.3 01/11/2022   Lab Results  Component Value Date   TSH 1.580 12/01/2021   Lab Results  Component Value Date   CHOL 207 (H) 01/11/2022   HDL 86 01/11/2022   LDLCALC 109 (H) 01/11/2022   TRIG 69 01/11/2022   CHOLHDL 2.4 01/11/2022   Lab Results  Component Value Date   VD25OH 66.5 12/11/2023   VD25OH 47.9 07/13/2023   VD25OH 57.1 01/04/2023   Lab Results  Component Value Date   WBC 12.3 (H) 07/10/2024   HGB 9.3 (L) 07/10/2024   HCT 30.8 (L) 07/10/2024   MCV 88.0 07/10/2024   PLT 391 07/10/2024   Lab Results  Component Value Date   IRON 26 (L) 04/14/2024   TIBC 245 (L) 04/14/2024   FERRITIN 509 (H) 04/14/2024    Attestation Statements:   Reviewed by clinician on day of visit: allergies, medications, problem list, medical history,  surgical history, family history, social history, and previous encounter notes.  Time spent on visit including pre-visit chart review and post-visit care and charting was 20 minutes.   I have reviewed the above documentation for accuracy and completeness, and I agree with the above. -  Kenzlie Disch d. Jonie Burdell, NP-C

## 2024-08-10 ENCOUNTER — Other Ambulatory Visit (INDEPENDENT_AMBULATORY_CARE_PROVIDER_SITE_OTHER): Payer: Self-pay | Admitting: Adult Health

## 2024-08-15 ENCOUNTER — Encounter (INDEPENDENT_AMBULATORY_CARE_PROVIDER_SITE_OTHER): Payer: Self-pay | Admitting: Adult Health

## 2024-08-15 ENCOUNTER — Ambulatory Visit (INDEPENDENT_AMBULATORY_CARE_PROVIDER_SITE_OTHER): Admitting: Adult Health

## 2024-08-15 VITALS — BP 106/67 | HR 94 | Temp 98.1°F | Ht 62.0 in | Wt 195.0 lb

## 2024-08-15 DIAGNOSIS — R7303 Prediabetes: Secondary | ICD-10-CM | POA: Diagnosis not present

## 2024-08-15 DIAGNOSIS — E039 Hypothyroidism, unspecified: Secondary | ICD-10-CM | POA: Diagnosis not present

## 2024-08-15 DIAGNOSIS — E669 Obesity, unspecified: Secondary | ICD-10-CM

## 2024-08-15 DIAGNOSIS — I10 Essential (primary) hypertension: Secondary | ICD-10-CM

## 2024-08-15 DIAGNOSIS — Z6841 Body Mass Index (BMI) 40.0 and over, adult: Secondary | ICD-10-CM

## 2024-08-15 DIAGNOSIS — Z6835 Body mass index (BMI) 35.0-35.9, adult: Secondary | ICD-10-CM

## 2024-08-15 DIAGNOSIS — G4733 Obstructive sleep apnea (adult) (pediatric): Secondary | ICD-10-CM

## 2024-08-15 MED ORDER — TIRZEPATIDE-WEIGHT MANAGEMENT 7.5 MG/0.5ML ~~LOC~~ SOLN: 7.5000 mg | SUBCUTANEOUS | 0 refills | Status: DC

## 2024-08-15 NOTE — Progress Notes (Signed)
 WEIGHT SUMMARY AND BIOMETRICS  Vitals Temp: 98.1 F (36.7 C) BP: 106/67 Pulse Rate: 94 SpO2: 100 %   Anthropometric Measurements Height: 5' 2 (1.575 m) Weight: 195 lb (88.5 kg) BMI (Calculated): 35.66 Weight at Last Visit: 203 lb Weight Lost Since Last Visit: 8 lb Weight Gained Since Last Visit: 0 Starting Weight: 256 lb Total Weight Loss (lbs): 61 lb (27.7 kg)   Body Composition  Body Fat %: 48.6 % Fat Mass (lbs): 94.8 lbs Muscle Mass (lbs): 95.2 lbs Total Body Water  (lbs): 77.4 lbs Visceral Fat Rating : 14   Other Clinical Data Fasting: no Labs: no Today's Visit #: 56 Starting Date: 11/18/19    Chief Complaint:   OBESITY Melissa [name] is here to discuss her progress with her obesity treatment plan.  She is on the the Category 3 Plan and states she is following her eating plan approximately 75 % of the time.  She states she is exercising Gym/Physical Therapy 30/30 minutes 2/2 times per week.  Interim History:  She has recent chronic f/u with labs with her PCP/[facility] Reviewed Labs in Care Everywhere and discussed recent medication changes.  She was seen by Nephrologist- she is scheduled for L Nephrectomy Nov 2025 Last Metabolic Panel demonstrates normal kidney fx  Her roommate moved out approximately 3 weeks ago- she has been focused on home renovations.  Current Weight 195 lbs with ultimate goal weight of 150-160 lbs  Subjective:   1. Hypothyroidism, unspecified type 07/26/2024 Component Ref Range & Units 2 wk ago  TSH 0.450 - 4.50 uIU/mL 19.600 High    PCP adjusted Synthroid  125mcg 1 tab daily to 1.5 tabs daily She denies Fatigue and Weakness,Cold Intolerance,Dry Skin and Hair, Hair Loss, or Constipation  2. Hypertension, unspecified type BP excellent at OV She denies CP with exertion She is currently on losartan  (COZAAR ) 25 MG tablet  rosuvastatin  (CRESTOR ) 10 MG tablet  apixaban  (ELIQUIS ) 2.5 MG TABS tablet   3. Pre-diabetes Lab  Results  Component Value Date   HGBA1C 4.9 07/10/2024   HGBA1C 5.5 12/11/2023   HGBA1C 5.9 (H) 07/13/2023    She is currently on daily Metformin  500mg  1 tab at lunch and 1 tab at dinner and weekly Zepbound  7.5mg  Denies mass in neck, dysphagia, dyspepsia, persistent hoarseness, abdominal pain, or N/V/C   4. OSA (obstructive sleep apnea) Started on Zepbound  2.5mg  on/about 11/09/2023  She increased strength from 5mg  to 7.5mg  the last two weeks and is tolerating well.  Assessment/Plan:   1. Hypothyroidism, unspecified type Continue Synthroid  125mcg 1.5 tabs daily and f/u with PCP as directed  2. Hypertension, unspecified type (Primary) Continue healthy eating and regular exercise Continue losartan  (COZAAR ) 25 MG tablet  rosuvastatin  (CRESTOR ) 10 MG tablet  apixaban  (ELIQUIS ) 2.5 MG TABS tablet   3. Pre-diabetes Continue daily Metformin  500mg  1 tab at lunch and 1 tab at dinner- denies need for refill today Continue healthy eating and regular exercise  4. OSA (obstructive sleep apnea) Continue healthy eating and regular exercise Refill and INCREASE []   tirzepatide  7.5 MG/0.5ML injection vial Inject 7.5 mg into the skin once a week. Dispense: 2 mL, Refills: 0 ordered   5. Obesity, current BMI 35.7 Refill and INCREASE []   tirzepatide  7.5 MG/0.5ML injection vial Inject 7.5 mg into the skin once a week. Dispense: 2 mL, Refills: 0 ordered   Melissa [name] is currently in the action stage of change. As such, her goal is to continue with weight loss efforts. She has agreed  to the Category 3 Plan.   Exercise goals: All adults should avoid inactivity. Some physical activity is better than none, and adults who participate in any amount of physical activity gain some health benefits. Adults should also include muscle-strengthening activities that involve all major muscle groups on 2 or more days a week.  Behavioral modification strategies: increasing lean protein intake, decreasing simple  carbohydrates, increasing vegetables, increasing water  intake, no skipping meals, meal planning and cooking strategies, keeping healthy foods in the home, ways to avoid boredom eating, and planning for success.  Melissa [name] has agreed to follow-up with our clinic in 4 weeks. She was informed of the importance of frequent follow-up visits to maximize her success with intensive lifestyle modifications for her multiple health conditions.  Objective:   Blood pressure 106/67, pulse 94, temperature 98.1 F (36.7 C), height 5' 2 (1.575 m), weight 195 lb (88.5 kg), last menstrual period 10/12/2012, SpO2 100%. Body mass index is 35.67 kg/m.  General: Cooperative, alert, well developed, in no acute distress. HEENT: Conjunctivae and lids unremarkable. Cardiovascular: Regular rhythm.  Lungs: Normal work of breathing. Neurologic: No focal deficits.   Lab Results  Component Value Date   CREATININE 0.80 07/10/2024   BUN 21 (H) 07/10/2024   NA 141 07/10/2024   K 5.2 (H) 07/10/2024   CL 106 07/10/2024   CO2 23 07/10/2024   Lab Results  Component Value Date   ALT 8 07/02/2024   AST 15 07/02/2024   ALKPHOS 127 (H) 07/02/2024   BILITOT <0.2 07/02/2024   Lab Results  Component Value Date   HGBA1C 4.9 07/10/2024   HGBA1C 5.5 12/11/2023   HGBA1C 5.9 (H) 07/13/2023   HGBA1C 5.8 (H) 01/04/2023   HGBA1C 5.7 (H) 05/05/2022   Lab Results  Component Value Date   INSULIN  9.2 12/11/2023   INSULIN  14.1 07/13/2023   INSULIN  14.0 01/04/2023   INSULIN  10.6 05/05/2022   INSULIN  8.3 01/11/2022   Lab Results  Component Value Date   TSH 1.580 12/01/2021   Lab Results  Component Value Date   CHOL 207 (H) 01/11/2022   HDL 86 01/11/2022   LDLCALC 109 (H) 01/11/2022   TRIG 69 01/11/2022   CHOLHDL 2.4 01/11/2022   Lab Results  Component Value Date   VD25OH 66.5 12/11/2023   VD25OH 47.9 07/13/2023   VD25OH 57.1 01/04/2023   Lab Results  Component Value Date   WBC 12.3 (H) 07/10/2024   HGB 9.3  (L) 07/10/2024   HCT 30.8 (L) 07/10/2024   MCV 88.0 07/10/2024   PLT 391 07/10/2024   Lab Results  Component Value Date   IRON 26 (L) 04/14/2024   TIBC 245 (L) 04/14/2024   FERRITIN 509 (H) 04/14/2024   Attestation Statements:   Reviewed by clinician on day of visit: allergies, medications, problem list, medical history, surgical history, family history, social history, and previous encounter notes.  I have reviewed the above documentation for accuracy and completeness, and I agree with the above. -  Michelangelo Rindfleisch d. Melissa Hearns, NP-C

## 2024-09-09 ENCOUNTER — Ambulatory Visit (INDEPENDENT_AMBULATORY_CARE_PROVIDER_SITE_OTHER): Payer: Self-pay | Admitting: Adult Health

## 2024-09-09 ENCOUNTER — Encounter (INDEPENDENT_AMBULATORY_CARE_PROVIDER_SITE_OTHER): Payer: Self-pay | Admitting: Adult Health

## 2024-09-09 VITALS — BP 131/80 | HR 83 | Temp 97.5°F | Ht 62.0 in | Wt 189.0 lb

## 2024-09-09 DIAGNOSIS — I10 Essential (primary) hypertension: Secondary | ICD-10-CM | POA: Diagnosis not present

## 2024-09-09 DIAGNOSIS — E669 Obesity, unspecified: Secondary | ICD-10-CM

## 2024-09-09 DIAGNOSIS — R7303 Prediabetes: Secondary | ICD-10-CM

## 2024-09-09 DIAGNOSIS — E66813 Obesity, class 3: Secondary | ICD-10-CM

## 2024-09-09 DIAGNOSIS — Z6834 Body mass index (BMI) 34.0-34.9, adult: Secondary | ICD-10-CM

## 2024-09-09 DIAGNOSIS — Z96652 Presence of left artificial knee joint: Secondary | ICD-10-CM

## 2024-09-09 DIAGNOSIS — E039 Hypothyroidism, unspecified: Secondary | ICD-10-CM

## 2024-09-09 MED ORDER — ZEPBOUND 7.5 MG/0.5ML ~~LOC~~ SOAJ: 7.5000 mg | SUBCUTANEOUS | 0 refills | Status: DC

## 2024-09-09 NOTE — Progress Notes (Signed)
 WEIGHT SUMMARY AND BIOMETRICS  Vitals Temp: (!) 97.5 F (36.4 C) BP: 131/80 Pulse Rate: 83 SpO2: 99 %   Anthropometric Measurements Height: 5' 2 (1.575 m) Weight: 189 lb (85.7 kg) BMI (Calculated): 34.56 Weight at Last Visit: 195lb Weight Lost Since Last Visit: 6lb Weight Gained Since Last Visit: 0lb Starting Weight: 256lb Total Weight Loss (lbs): 67 lb (30.4 kg)   Body Composition  Body Fat %: 47.1 % Fat Mass (lbs): 89.4 lbs Muscle Mass (lbs): 95.2 lbs Total Body Water  (lbs): 73.8 lbs Visceral Fat Rating : 13   Other Clinical Data Fasting: No Labs: No Today's Visit #: 72 Starting Date: 11/18/19    Chief Complaint:   OBESITY Melissa [name] is here to discuss her progress with her obesity treatment plan.  She is on the the Category 3 Plan and states she is following her eating plan approximately 75 % of the time.  She states she is exercising Stationary Bike/Walking 30-45 minutes 3 times per week.  Interim History:  Started on Zepbound  2.5mg  on/about 11/09/2023  She increased strength from 5mg  to 7.5mg  in early Oct 2025 Denies mass in neck, dysphagia, dyspepsia, persistent hoarseness, abdominal pain, or N/V/C   Reviewed Bioimpedance Results with pt: Muscle Mass: No Change Adipose Mass: -5.4 lbs  Current weight 189  lbs Goal weight 160 lbs  Subjective:   1. Hypothyroidism, unspecified type (ABNORMAL) TSH (08/26/2024 12:06 PM EST) Lab Results - (ABNORMAL) TSH (08/26/2024 12:06 PM EST) Component Value Ref Range Test Method Analysis Time Performed At Pathologist Signature  TSH 0.063 (L) 0.450 - 4.50 uIU/mL        PCP adjusted daily  Levothyroxine  125 mcg to 125mcg M/W/F and 1.5 tabs (187 mcg) T/R/S/S She denies SE with increased Levothyroxine   2. Hypertension, unspecified type BP stable at OV She denies tobacco/vape use She is on losartan  (COZAAR ) 25 MG tablet  rosuvastatin  (CRESTOR ) 10 MG tablet  apixaban  (ELIQUIS ) 2.5 MG TABS tablet   3.  Pre-diabetes She is currently on daily Metformin  500mg  1 tab at lunch and 1 tab at dinner She has been on weekly Zepbound  7.5 mg the last 4 weeks Denies mass in neck, dysphagia, dyspepsia, persistent hoarseness, abdominal pain, or N/V/C   4. S/p L TKR 08/30/2024 08/30/2024 Assessment: 1. 6 weeks status post left total knee replacement doing very well 2. 2 months status post conversion of right failed partial knee arthroplasty to total knee revision doing very well   Plan: Today we reviewed this interval clinical and radiographic follow-up of both of her knees. She is doing exceptionally well with regards to getting her motion and subsequent strength. As well as she is currently doing I will plan to see her back in September 2026 for clinical and radiographic follow-up. I have no restrictions in her activities. I would recommend that she use antibiotics for all dental work for the first year after surgery assessment by choice or for advanced dental work. Amoxicillin  can be prescribed through our office. At this point she is using minimal medications. She can use over-the-counter medications and Tylenol  for any discomfort she may have during her recovery.   Assessment/Plan:   1. Hypothyroidism, unspecified type (Primary) F/u with PCP as directed  2. Hypertension, unspecified type Continue healthy eating and regular cardiovascular exercise  3. Pre-diabetes Continue healthy eating and regular cardiovascular exercise Refill []   tirzepatide  (ZEPBOUND ) 7.5 MG/0.5ML Pen Inject 7.5 mg into the skin once a week. Dispense: 6 mL, Refills: 0 ordered  4. . S/p L TKR Continue healthy eating and regular cardiovascular exercise F/u with Orthopedic Care Team as directed  5. Obesity, current BMI 34.7 Refill []   tirzepatide  (ZEPBOUND ) 7.5 MG/0.5ML Pen Inject 7.5 mg into the skin once a week. Dispense: 6 mL, Refills: 0 ordered   Melissa [name] is currently in the action stage of change. As such, her goal is  to get back to weightloss efforts . She has agreed to the Category 3 Plan.   Exercise goals: For substantial health benefits, adults should do at least 150 minutes (2 hours and 30 minutes) a week of moderate-intensity, or 75 minutes (1 hour and 15 minutes) a week of vigorous-intensity aerobic physical activity, or an equivalent combination of moderate- and vigorous-intensity aerobic activity. Aerobic activity should be performed in episodes of at least 10 minutes, and preferably, it should be spread throughout the week.  Behavioral modification strategies: increasing lean protein intake, decreasing simple carbohydrates, increasing vegetables, increasing water  intake, meal planning and cooking strategies, keeping healthy foods in the home, ways to avoid boredom eating, and planning for success.  Melissa [name] has agreed to follow-up with our clinic in 4 weeks. She was informed of the importance of frequent follow-up visits to maximize her success with intensive lifestyle modifications for her multiple health conditions.   Objective:   Blood pressure 131/80, pulse 83, temperature (!) 97.5 F (36.4 C), height 5' 2 (1.575 m), weight 189 lb (85.7 kg), last menstrual period 10/12/2012, SpO2 99%. Body mass index is 34.57 kg/m.  General: Cooperative, alert, well developed, in no acute distress. HEENT: Conjunctivae and lids unremarkable. Cardiovascular: Regular rhythm.  Lungs: Normal work of breathing. Neurologic: No focal deficits.   Lab Results  Component Value Date   CREATININE 0.80 07/10/2024   BUN 21 (H) 07/10/2024   NA 141 07/10/2024   K 5.2 (H) 07/10/2024   CL 106 07/10/2024   CO2 23 07/10/2024   Lab Results  Component Value Date   ALT 8 07/02/2024   AST 15 07/02/2024   ALKPHOS 127 (H) 07/02/2024   BILITOT <0.2 07/02/2024   Lab Results  Component Value Date   HGBA1C 4.9 07/10/2024   HGBA1C 5.5 12/11/2023   HGBA1C 5.9 (H) 07/13/2023   HGBA1C 5.8 (H) 01/04/2023   HGBA1C 5.7 (H)  05/05/2022   Lab Results  Component Value Date   INSULIN  9.2 12/11/2023   INSULIN  14.1 07/13/2023   INSULIN  14.0 01/04/2023   INSULIN  10.6 05/05/2022   INSULIN  8.3 01/11/2022   Lab Results  Component Value Date   TSH 1.580 12/01/2021   Lab Results  Component Value Date   CHOL 207 (H) 01/11/2022   HDL 86 01/11/2022   LDLCALC 109 (H) 01/11/2022   TRIG 69 01/11/2022   CHOLHDL 2.4 01/11/2022   Lab Results  Component Value Date   VD25OH 66.5 12/11/2023   VD25OH 47.9 07/13/2023   VD25OH 57.1 01/04/2023   Lab Results  Component Value Date   WBC 12.3 (H) 07/10/2024   HGB 9.3 (L) 07/10/2024   HCT 30.8 (L) 07/10/2024   MCV 88.0 07/10/2024   PLT 391 07/10/2024   Lab Results  Component Value Date   IRON 26 (L) 04/14/2024   TIBC 245 (L) 04/14/2024   FERRITIN 509 (H) 04/14/2024   Attestation Statements:   Reviewed by clinician on day of visit: allergies, medications, problem list, medical history, surgical history, family history, social history, and previous encounter notes.  I have reviewed the above documentation for accuracy and completeness, and  I agree with the above. -  Inocencia Murtaugh d. Kmya Placide, NP-C

## 2024-10-03 ENCOUNTER — Other Ambulatory Visit: Payer: Self-pay | Admitting: Urology

## 2024-10-10 ENCOUNTER — Encounter (INDEPENDENT_AMBULATORY_CARE_PROVIDER_SITE_OTHER): Payer: Self-pay | Admitting: Adult Health

## 2024-10-10 ENCOUNTER — Ambulatory Visit (INDEPENDENT_AMBULATORY_CARE_PROVIDER_SITE_OTHER): Admitting: Adult Health

## 2024-10-10 VITALS — BP 121/78 | HR 86 | Temp 97.5°F | Ht 62.0 in | Wt 188.0 lb

## 2024-10-10 DIAGNOSIS — R7303 Prediabetes: Secondary | ICD-10-CM | POA: Diagnosis not present

## 2024-10-10 DIAGNOSIS — Z6834 Body mass index (BMI) 34.0-34.9, adult: Secondary | ICD-10-CM | POA: Diagnosis not present

## 2024-10-10 DIAGNOSIS — E039 Hypothyroidism, unspecified: Secondary | ICD-10-CM

## 2024-10-10 DIAGNOSIS — E669 Obesity, unspecified: Secondary | ICD-10-CM

## 2024-10-10 DIAGNOSIS — E559 Vitamin D deficiency, unspecified: Secondary | ICD-10-CM | POA: Diagnosis not present

## 2024-10-10 DIAGNOSIS — Z6841 Body Mass Index (BMI) 40.0 and over, adult: Secondary | ICD-10-CM

## 2024-10-10 MED ORDER — METFORMIN HCL 500 MG PO TABS: ORAL_TABLET | ORAL | 0 refills | Status: AC

## 2024-10-10 NOTE — Progress Notes (Unsigned)
 WEIGHT SUMMARY AND BIOMETRICS  Vitals Temp: (!) 97.5 F (36.4 C) BP: 121/78 Pulse Rate: 86 SpO2: 97 %   Anthropometric Measurements Height: 5' 2 (1.575 m) Weight: 188 lb (85.3 kg) BMI (Calculated): 34.38 Weight at Last Visit: 189lb Weight Lost Since Last Visit: 1lb Weight Gained Since Last Visit: 0lb Starting Weight: 256lb Total Weight Loss (lbs): 68 lb (30.8 kg)   Body Composition  Body Fat %: 47 % Fat Mass (lbs): 88.6 lbs Muscle Mass (lbs): 94.8 lbs Total Body Water  (lbs): 72.8 lbs Visceral Fat Rating : 13   Other Clinical Data Fasting: yes Labs: No Today's Visit #: 17 Starting Date: 11/18/19    Chief Complaint:   OBESITY Melissa [name] is here to discuss her progress with her obesity treatment plan.  She is on the the Category 3 Plan and states she is following her eating plan approximately 70 % of the time.  She states she is exercising [employer] 20 minutes 4 times per week.  Interim History:  09/23/2024 Colonoscopy- 1 polyp remove, repeat study in 7 yrs Hunger/appetite-*** Cravings- *** Stress- *** Sleep- *** Exercise-*** Hydration-***  Subjective:   1. Pre-diabetes ***  2. Hypothyroidism, unspecified type ***  3. Vitamin D  deficiency ***  4. Obesity, current BMI 34.5 ***   Assessment/Plan:   1. Pre-diabetes (Primary) *** - metFORMIN  (GLUCOPHAGE ) 500 MG tablet; 1 tab at lunch 1 tab at dinner  Dispense: 180 tablet; Refill: 0 - Comprehensive metabolic panel with GFR - Hemoglobin A1c - Insulin , random  2. Hypothyroidism, unspecified type F/u with PCP as directed  3. Vitamin D  deficiency Check Labs - VITAMIN D  25 Hydroxy (Vit-D Deficiency, Fractures)  4. Obesity, current BMI 34.5  Jeffery is currently in the action stage of change. As such, her goal is to continue with weight loss efforts. She has agreed to the Category 3 Plan.   Exercise goals: All adults should avoid inactivity. Some physical activity is better than  none, and adults who participate in any amount of physical activity gain some health benefits. Adults should also include muscle-strengthening activities that involve all major muscle groups on 2 or more days a week.  Behavioral modification strategies: increasing lean protein intake, decreasing simple carbohydrates, increasing vegetables, increasing water  intake, no skipping meals, meal planning and cooking strategies, keeping healthy foods in the home, and planning for success.  Dhamar has agreed to follow-up with our clinic in 4 weeks. She was informed of the importance of frequent follow-up visits to maximize her success with intensive lifestyle modifications for her multiple health conditions.   Laini was informed we would discuss her lab results at her next visit unless there is a critical issue that needs to be addressed sooner. Chanae agreed to keep her next visit at the agreed upon time to discuss these results.  Objective:   Blood pressure 121/78, pulse 86, temperature (!) 97.5 F (36.4 C), height 5' 2 (1.575 m), weight 188 lb (85.3 kg), last menstrual period 10/12/2012, SpO2 97%. Body mass index is 34.39 kg/m.  General: Cooperative, alert, well developed, in no acute distress. HEENT: Conjunctivae and lids unremarkable. Cardiovascular: Regular rhythm.  Lungs: Normal work of breathing. Neurologic: No focal deficits.   Lab Results  Component Value Date   CREATININE 0.80 07/10/2024   BUN 21 (H) 07/10/2024   NA 141 07/10/2024   K 5.2 (H) 07/10/2024   CL 106 07/10/2024   CO2 23 07/10/2024   Lab Results  Component Value Date  ALT 8 07/02/2024   AST 15 07/02/2024   ALKPHOS 127 (H) 07/02/2024   BILITOT <0.2 07/02/2024   Lab Results  Component Value Date   HGBA1C 4.9 07/10/2024   HGBA1C 5.5 12/11/2023   HGBA1C 5.9 (H) 07/13/2023   HGBA1C 5.8 (H) 01/04/2023   HGBA1C 5.7 (H) 05/05/2022   Lab Results  Component Value Date   INSULIN  9.2 12/11/2023   INSULIN  14.1  07/13/2023   INSULIN  14.0 01/04/2023   INSULIN  10.6 05/05/2022   INSULIN  8.3 01/11/2022   Lab Results  Component Value Date   TSH 1.580 12/01/2021   Lab Results  Component Value Date   CHOL 207 (H) 01/11/2022   HDL 86 01/11/2022   LDLCALC 109 (H) 01/11/2022   TRIG 69 01/11/2022   CHOLHDL 2.4 01/11/2022   Lab Results  Component Value Date   VD25OH 66.5 12/11/2023   VD25OH 47.9 07/13/2023   VD25OH 57.1 01/04/2023   Lab Results  Component Value Date   WBC 12.3 (H) 07/10/2024   HGB 9.3 (L) 07/10/2024   HCT 30.8 (L) 07/10/2024   MCV 88.0 07/10/2024   PLT 391 07/10/2024   Lab Results  Component Value Date   IRON 26 (L) 04/14/2024   TIBC 245 (L) 04/14/2024   FERRITIN 509 (H) 04/14/2024   Attestation Statements:   Reviewed by clinician on day of visit: allergies, medications, problem list, medical history, surgical history, family history, social history, and previous encounter notes.  I have reviewed the above documentation for accuracy and completeness, and I agree with the above. - Trinisha Paget d. Hibba Schram, NP-C

## 2024-10-11 LAB — COMPREHENSIVE METABOLIC PANEL WITH GFR
ALT: 12 IU/L (ref 0–32)
AST: 11 IU/L (ref 0–40)
Albumin: 4.5 g/dL (ref 3.8–4.9)
Alkaline Phosphatase: 105 IU/L (ref 49–135)
BUN/Creatinine Ratio: 18 (ref 12–28)
BUN: 14 mg/dL (ref 8–27)
Bilirubin Total: 0.3 mg/dL (ref 0.0–1.2)
CO2: 22 mmol/L (ref 20–29)
Calcium: 10.3 mg/dL (ref 8.7–10.3)
Chloride: 104 mmol/L (ref 96–106)
Creatinine, Ser: 0.77 mg/dL (ref 0.57–1.00)
Globulin, Total: 2.6 g/dL (ref 1.5–4.5)
Glucose: 101 mg/dL — ABNORMAL HIGH (ref 70–99)
Potassium: 5.4 mmol/L — ABNORMAL HIGH (ref 3.5–5.2)
Sodium: 139 mmol/L (ref 134–144)
Total Protein: 7.1 g/dL (ref 6.0–8.5)
eGFR: 88 mL/min/1.73

## 2024-10-11 LAB — HEMOGLOBIN A1C
Est. average glucose Bld gHb Est-mCnc: 108 mg/dL
Hgb A1c MFr Bld: 5.4 % (ref 4.8–5.6)

## 2024-10-11 LAB — INSULIN, RANDOM: INSULIN: 12.8 u[IU]/mL (ref 2.6–24.9)

## 2024-10-11 LAB — VITAMIN D 25 HYDROXY (VIT D DEFICIENCY, FRACTURES): Vit D, 25-Hydroxy: 48.8 ng/mL (ref 30.0–100.0)

## 2024-10-23 ENCOUNTER — Telehealth (INDEPENDENT_AMBULATORY_CARE_PROVIDER_SITE_OTHER): Payer: Self-pay

## 2024-10-23 NOTE — Telephone Encounter (Signed)
 PA wasn't started due to pt last request was sent back in September 09, 2024 without any refills. And the dose for the PA was incorrect so I cancel PA.

## 2024-11-06 NOTE — Progress Notes (Addendum)
 PCP - Ozell Reilly, MD  Cardiologist - N/A  PPM/ICD -  Device Orders -  Rep Notified -   Chest x-ray -  EKG - 05-16-24 epic Stress Test -  ECHO -  Cardiac Cath -  Labs-hgbA1c 10-10-24 epic 5.4  Sleep Study -YES CPAP - NO CPAP ANYMORE  Fasting Blood Sugar - n/a Checks Blood Sugar __n/a___ times a day  Blood Thinner Instructions: Aspirin  Instructions: 81mg  asa 1 week hold  ERAS Protcol - PRE-SURGERY n/a  ZEPBOUND -stop one week prior   COVID vaccine -yes  Activity--Able to climb a flight of stairs with no CP or SOB Anesthesia review: pre-DM , CKD, OSA  Patient denies shortness of breath, fever, cough and chest pain at PAT appointment   All instructions explained to the patient, with a verbal understanding of the material. Patient agrees to go over the instructions while at home for a better understanding. Patient also instructed to self quarantine after being tested for COVID-19. The opportunity to ask questions was provided.

## 2024-11-06 NOTE — Patient Instructions (Addendum)
 SURGICAL WAITING ROOM VISITATION  Patients having surgery or a procedure may have no more than 2 support people in the waiting area - these visitors may rotate.    Children ages 70 and under will not be able to visit patients in [facility] under most circumstances.   Visitors with respiratory illnesses are discouraged from visiting and should remain at home.  If the patient needs to stay at the hospital during part of their recovery, the visitor guidelines for inpatient rooms apply. Pre-op nurse will coordinate an appropriate time for 1 support person to accompany patient in pre-op.  This support person may not rotate.    Please refer to the [facility] website for the visitor guidelines for Inpatients (after your surgery is over and you are in a regular room).       Your procedure is scheduled on: 11-13-24   Report to [facility] Main Entrance    Report to admitting at    0630 AM   Call this number if you have problems the morning of surgery [phone]   Do not eat food or drink liquids :After Midnight.               If you have questions, please contact your surgeons office.   FOLLOW  ANY ADDITIONAL PRE OP INSTRUCTIONS YOU RECEIVED FROM YOUR SURGEON'S OFFICE!!!     Oral Hygiene is also important to reduce your risk of infection.                                    Remember - BRUSH YOUR TEETH THE MORNING OF SURGERY WITH YOUR REGULAR TOOTHPASTE  DENTURES WILL BE REMOVED PRIOR TO SURGERY PLEASE DO NOT APPLY Poly grip OR ADHESIVES!!!   Do NOT smoke after Midnight   Stop all vitamins and herbal supplements 7 days before surgery.   Take these medicines the morning of surgery with A SIP OF WATER : loratadine , levothyroxine , fluoxetine , buspar  if needed, wellbutrin , symbicort if needed             ZEPOUND NONE FOR ONE WEEK PRIOR TO SURGERY  DO NOT TAKE ANY ORAL DIABETIC MEDICATIONS DAY OF YOUR SURGERY  Bring CPAP mask and tubing day of surgery.                               You may not have any metal on your body including hair pins, jewelry, and body piercing             Do not wear make-up, lotions, powders, perfumes/cologne, or deodorant  Do not wear nail polish including gel and S&S, artificial/acrylic nails, or any other type of covering on natural nails including finger and toenails. If you have artificial nails, gel coating, etc. that needs to be removed by a nail salon please have this removed prior to surgery or surgery may need to be canceled/ delayed if the surgeon/ anesthesia feels like they are unable to be safely monitored.   Do not shave  48 hours prior to surgery.            Do not bring valuables to the hospital. [facility] IS NOT             RESPONSIBLE   FOR VALUABLES.   Contacts, glasses, dentures or bridgework may not be worn into surgery.   Bring small  overnight bag day of surgery.   DO NOT BRING YOUR HOME MEDICATIONS TO THE HOSPITAL. PHARMACY WILL DISPENSE MEDICATIONS LISTED ON YOUR MEDICATION LIST TO YOU DURING YOUR ADMISSION IN THE HOSPITAL!    Patients discharged on the day of surgery will not be allowed to drive home.  Someone NEEDS to stay with you for the first 24 hours after anesthesia.   Special Instructions: Bring a copy of your healthcare power of attorney and living will documents the day of surgery if you haven't scanned them before.              Please read over the following fact sheets you were given: IF YOU HAVE QUESTIONS ABOUT YOUR PRE-OP INSTRUCTIONS PLEASE CALL [phone].    If you test positive for Covid or have been in contact with anyone that has tested positive in the last 10 days please notify you surgeon.   [facility] - Preparing for Surgery Before surgery, you can play an important role.  Because skin is not sterile, your skin needs to be as free of germs as possible.  You can reduce the number of germs on your skin by washing with CHG (chlorahexidine gluconate) soap before surgery.   CHG is an antiseptic cleaner which kills germs and bonds with the skin to continue killing germs even after washing. Please DO NOT use if you have an allergy to CHG or antibacterial soaps.  If your skin becomes reddened/irritated stop using the CHG and inform your nurse when you arrive at Short Stay. Do not shave (including legs and underarms) for at least 48 hours prior to the first CHG shower.  You may shave your face/neck.  Please follow these instructions carefully:  1.  Shower with CHG Soap the night before surgery ONLY (DO NOT USE THE SOAP THE MORNING OF SURGERY).  2.  If you choose to wash your hair, wash your hair first as usual with your normal  shampoo.  3.  After you shampoo, rinse your hair and body thoroughly to remove the shampoo.                             4.  Use CHG as you would any other liquid soap.  You can apply chg directly to the skin and wash.  Gently with a scrungie or clean washcloth.  5.  Apply the CHG Soap to your body ONLY FROM THE NECK DOWN.   Do  not use on face/ open                           Wound or open sores. Avoid contact with eyes, ears mouth and  genitals (private parts).                       Wash face,  Genitals (private parts) with your normal soap.             6.  Wash thoroughly, paying special attention to the area where your  surgery  will be performed.  7.  Thoroughly rinse your body with warm water  from the neck down.  8.  DO NOT shower/wash with your normal soap after using and rinsing off the CHG Soap.                9.  Pat yourself dry with a clean towel.  10.  Wear clean pajamas.            11.  Place clean sheets on your bed the night of your first shower and do not  sleep with pets. Day of Surgery : Do not apply any CHG, lotions/deodorants the morning of surgery.  Please wear clean clothes to the hospital/surgery center.  FAILURE TO FOLLOW THESE INSTRUCTIONS MAY RESULT IN THE CANCELLATION OF YOUR SURGERY  PATIENT  SIGNATURE_________________________________  NURSE SIGNATURE__________________________________  ________________________________________________________________________  WHAT IS A BLOOD TRANSFUSION? Blood Transfusion Information  A transfusion is the replacement of blood or some of its parts. Blood is made up of multiple cells which provide different functions. Red blood cells carry oxygen and are used for blood loss replacement. White blood cells fight against infection. Platelets control bleeding. Plasma helps clot blood. Other blood products are available for specialized needs, such as hemophilia or other clotting disorders. BEFORE THE TRANSFUSION  Who gives blood for transfusions?  Healthy volunteers who are fully evaluated to make sure their blood is safe. This is blood bank blood. Transfusion therapy is the safest it has ever been in the practice of medicine. Before blood is taken from a donor, a complete history is taken to make sure that person has no history of diseases nor engages in risky social behavior (examples are intravenous drug use or sexual activity with multiple partners). The donor's travel history is screened to minimize risk of transmitting infections, such as malaria. The donated blood is tested for signs of infectious diseases, such as HIV and hepatitis. The blood is then tested to be sure it is compatible with you in order to minimize the chance of a transfusion reaction. If you or a relative donates blood, this is often done in anticipation of surgery and is not appropriate for emergency situations. It takes many days to process the donated blood. RISKS AND COMPLICATIONS Although transfusion therapy is very safe and saves many lives, the main dangers of transfusion include:  Getting an infectious disease. Developing a transfusion reaction. This is an allergic reaction to something in the blood you were given. Every precaution is taken to prevent this. The decision to  have a blood transfusion has been considered carefully by your caregiver before blood is given. Blood is not given unless the benefits outweigh the risks. AFTER THE TRANSFUSION Right after receiving a blood transfusion, you will usually feel much better and more energetic. This is especially true if your red blood cells have gotten low (anemic). The transfusion raises the level of the red blood cells which carry oxygen, and this usually causes an energy increase. The nurse administering the transfusion will monitor you carefully for complications. HOME CARE INSTRUCTIONS  No special instructions are needed after a transfusion. You may find your energy is better. Speak with your caregiver about any limitations on activity for underlying diseases you may have. SEEK MEDICAL CARE IF:  Your condition is not improving after your transfusion. You develop redness or irritation at the intravenous (IV) site. SEEK IMMEDIATE MEDICAL CARE IF:  Any of the following symptoms occur over the next 12 hours: Shaking chills. You have a temperature by mouth above 102 F (38.9 C), not controlled by medicine. Chest, back, or muscle pain. People around you feel you are not acting correctly or are confused. Shortness of breath or difficulty breathing. Dizziness and fainting. You get a rash or develop hives. You have a decrease in urine output. Your urine turns a dark color or changes to  pink, red, or brown. Any of the following symptoms occur over the next 10 days: You have a temperature by mouth above 102 F (38.9 C), not controlled by medicine. Shortness of breath. Weakness after normal activity. The white part of the eye turns yellow (jaundice). You have a decrease in the amount of urine or are urinating less often. Your urine turns a dark color or changes to pink, red, or brown. Document Released: 10/07/2000 Document Revised: 01/02/2012 Document Reviewed: 05/26/2008 [facility] Patient Information 2014  [place], [place].  ___________________________________________________________________

## 2024-11-07 ENCOUNTER — Other Ambulatory Visit: Payer: Self-pay

## 2024-11-07 ENCOUNTER — Encounter (HOSPITAL_COMMUNITY): Payer: Self-pay

## 2024-11-07 ENCOUNTER — Encounter (HOSPITAL_COMMUNITY)
Admission: RE | Admit: 2024-11-07 | Discharge: 2024-11-07 | Disposition: A | Discharge status: Home / Self Care | Source: Ambulatory Visit | Attending: Urology | Admitting: Urology

## 2024-11-07 DIAGNOSIS — Z01818 Encounter for other preprocedural examination: Secondary | ICD-10-CM | POA: Diagnosis present

## 2024-11-07 LAB — BASIC METABOLIC PANEL WITH GFR
Anion gap: 10 (ref 5–15)
BUN: 15 mg/dL (ref 6–20)
CO2: 25 mmol/L (ref 22–32)
Calcium: 9.6 mg/dL (ref 8.9–10.3)
Chloride: 106 mmol/L (ref 98–111)
Creatinine, Ser: 0.96 mg/dL (ref 0.44–1.00)
GFR, Estimated: >60 mL/min
Glucose, Bld: 97 mg/dL (ref 70–99)
Potassium: 4.8 mmol/L (ref 3.5–5.1)
Sodium: 141 mmol/L (ref 135–145)

## 2024-11-07 LAB — CBC
HCT: 35.8 % — ABNORMAL LOW (ref 36.0–46.0)
Hemoglobin: 10.9 g/dL — ABNORMAL LOW (ref 12.0–15.0)
MCH: 25.2 pg — ABNORMAL LOW (ref 26.0–34.0)
MCHC: 30.4 g/dL (ref 30.0–36.0)
MCV: 82.9 fL (ref 80.0–100.0)
Platelets: 415 K/uL — ABNORMAL HIGH (ref 150–400)
RBC: 4.32 MIL/uL (ref 3.87–5.11)
RDW: 15.4 % (ref 11.5–15.5)
WBC: 5.7 K/uL (ref 4.0–10.5)
nRBC: 0 % (ref 0.0–0.2)

## 2024-11-12 ENCOUNTER — Ambulatory Visit (INDEPENDENT_AMBULATORY_CARE_PROVIDER_SITE_OTHER): Admitting: Adult Health

## 2024-11-12 ENCOUNTER — Encounter (INDEPENDENT_AMBULATORY_CARE_PROVIDER_SITE_OTHER): Payer: Self-pay | Admitting: Adult Health

## 2024-11-12 VITALS — BP 132/77 | HR 89 | Temp 98.2°F | Ht 62.0 in | Wt 191.0 lb

## 2024-11-12 DIAGNOSIS — R7303 Prediabetes: Secondary | ICD-10-CM

## 2024-11-12 DIAGNOSIS — Z6834 Body mass index (BMI) 34.0-34.9, adult: Secondary | ICD-10-CM

## 2024-11-12 DIAGNOSIS — E66813 Obesity, class 3: Secondary | ICD-10-CM

## 2024-11-12 DIAGNOSIS — E559 Vitamin D deficiency, unspecified: Secondary | ICD-10-CM | POA: Diagnosis not present

## 2024-11-12 DIAGNOSIS — I10 Essential (primary) hypertension: Secondary | ICD-10-CM

## 2024-11-12 DIAGNOSIS — E669 Obesity, unspecified: Secondary | ICD-10-CM

## 2024-11-12 DIAGNOSIS — Z0000 Encounter for general adult medical examination without abnormal findings: Secondary | ICD-10-CM

## 2024-11-12 NOTE — Progress Notes (Signed)
 "    WEIGHT SUMMARY AND BIOMETRICS  Vitals Temp: 98.2 F (36.8 C) BP: 132/77 Pulse Rate: 89 SpO2: 100 %   Anthropometric Measurements Height: 5' 2 (1.575 m) Weight: 191 lb (86.6 kg) BMI (Calculated): 34.93 Weight at Last Visit: 188lb Weight Lost Since Last Visit: 0lb Weight Gained Since Last Visit: 3lb Starting Weight: 256lb Total Weight Loss (lbs): 65 lb (29.5 kg)   Body Composition  Body Fat %: 46.5 % Fat Mass (lbs): 88.8 lbs Muscle Mass (lbs): 97 lbs Total Body Water  (lbs): 75.2 lbs Visceral Fat Rating : 13   Other Clinical Data Fasting: No Labs: No Today's Visit #: 61 Starting Date: 11/18/19    Chief Complaint:   OBESITY Melissa [name] is here to discuss her progress with her obesity treatment plan.  She is on the the Category 3 Plan and states she is following her eating plan approximately 75 % of the time.  She states she is exercising Walking 20 minutes 7 times per week.  Interim History:  She adopted a new dog, Nidia  She is a 77 week old husky mix puppy- she has provided pictures and she is absolutely adorable!  She will undergo LEFT ROBOTIC RADICAL NEPHRECTOMY tomorrow with Dr. Devere. Procedure is scheduled at [facility] She anticipates to be hospitalized 1-3 days. She has her close friend, Ronal to assist with her recovery once she is d/c'd home.  Last dose of Zepbound  7.5mg  was 11/03/24 She has continued Metformin  500mg  BID with meals  She reports she feels stable in terms of musculoskeletal concerns.  Subjective:   1. Healthcare maintenance 11/13/2024    Procedure Laterality Anesthesia  NEPHRECTOMY, RADICAL, ROBOT-ASSISTED, LAPAROSCOPIC, ADULT Left General  LEFT ROBOTIC RADICAL NEPHRECTOMY        2. Pre-diabetes Discussed Labs Lab Results  Component Value Date   HGBA1C 5.4 10/10/2024   HGBA1C 4.9 07/10/2024   HGBA1C 5.5 12/11/2023     Latest Reference Range & Units 10/10/24 11:38  Glucose 70 - 99 mg/dL 898 (H)   Hemoglobin J8R 4.8 - 5.6 % 5.4  Est. average glucose Bld gHb Est-mCnc mg/dL 891  INSULIN  2.6 - 24.9 uIU/mL 12.8  (H): Data is abnormally high   Latest Reference Range & Units 11/07/24 09:33  GFR, Estimated >60 mL/min >60   CBG slightly elevated A1c at goal Insulin  Level slightly above goal of 5 Last dose of Zepbound  7.5mg  was 11/03/24 She has continued Metformin  500mg  BID with meals  3. Vitamin D  deficiency Discussed Labs  Latest Reference Range & Units 10/10/24 11:38  Vitamin D , 25-Hydroxy 30.0 - 100.0 ng/mL 48.8   Vit D Level stable  4. Hypertension, unspecified type Discussed Labs 10/10/2024 K+ Level slightly elevated Repeat CMP 11/07/2024 K+ level normalized Kidney fx and liver enzymes are both stable BP stable at OV She denies tobacco/vape use  Assessment/Plan:   1. Healthcare maintenance Follow pre-op, peri-op, and post-op instructions  2. Pre-diabetes (Primary) Resume Zepbound  per Surgeon's orders  3. Vitamin D  deficiency Monitor Labs  4. Hypertension, unspecified type Continue healthy eating  Continue  losartan  (COZAAR ) 25 MG tablet  rosuvastatin  (CRESTOR ) 10 MG tablet  aspirin  EC 81 MG tablet   5. Obesity, current BMI 34.5  Melissa [name] is currently in the action stage of change. As such, her goal is to maintain weight for now. She has agreed to the Category 3 Plan.   Exercise goals: No exercise has been prescribed at this time.  Behavioral modification strategies: increasing lean protein intake, decreasing  simple carbohydrates, increasing vegetables, increasing water  intake, no skipping meals, meal planning and cooking strategies, keeping healthy foods in the home, ways to avoid boredom eating, and planning for success.  Melissa [name] has agreed to follow-up with our clinic in 5 weeks. She was informed of the importance of frequent follow-up visits to maximize her success with intensive lifestyle modifications for her multiple health conditions.   Objective:    Blood pressure 132/77, pulse 89, temperature 98.2 F (36.8 C), height 5' 2 (1.575 m), weight 191 lb (86.6 kg), last menstrual period 10/12/2012, SpO2 100%. Body mass index is 34.93 kg/m.  General: Cooperative, alert, well developed, in no acute distress. HEENT: Conjunctivae and lids unremarkable. Cardiovascular: Regular rhythm.  Lungs: Normal work of breathing. Neurologic: No focal deficits.   Lab Results  Component Value Date   CREATININE 0.96 11/07/2024   BUN 15 11/07/2024   NA 141 11/07/2024   K 4.8 11/07/2024   CL 106 11/07/2024   CO2 25 11/07/2024   Lab Results  Component Value Date   ALT 12 10/10/2024   AST 11 10/10/2024   ALKPHOS 105 10/10/2024   BILITOT 0.3 10/10/2024   Lab Results  Component Value Date   HGBA1C 5.4 10/10/2024   HGBA1C 4.9 07/10/2024   HGBA1C 5.5 12/11/2023   HGBA1C 5.9 (H) 07/13/2023   HGBA1C 5.8 (H) 01/04/2023   Lab Results  Component Value Date   INSULIN  12.8 10/10/2024   INSULIN  9.2 12/11/2023   INSULIN  14.1 07/13/2023   INSULIN  14.0 01/04/2023   INSULIN  10.6 05/05/2022   Lab Results  Component Value Date   TSH 1.580 12/01/2021   Lab Results  Component Value Date   CHOL 207 (H) 01/11/2022   HDL 86 01/11/2022   LDLCALC 109 (H) 01/11/2022   TRIG 69 01/11/2022   CHOLHDL 2.4 01/11/2022   Lab Results  Component Value Date   VD25OH 48.8 10/10/2024   VD25OH 66.5 12/11/2023   VD25OH 47.9 07/13/2023   Lab Results  Component Value Date   WBC 5.7 11/07/2024   HGB 10.9 (L) 11/07/2024   HCT 35.8 (L) 11/07/2024   MCV 82.9 11/07/2024   PLT 415 (H) 11/07/2024   Lab Results  Component Value Date   IRON 26 (L) 04/14/2024   TIBC 245 (L) 04/14/2024   FERRITIN 509 (H) 04/14/2024   Attestation Statements:   Reviewed by clinician on day of visit: allergies, medications, problem list, medical history, surgical history, family history, social history, and previous encounter notes.  I have reviewed the above documentation for  accuracy and completeness, and I agree with the above. -  Samaiya Awadallah d. Damyon Mullane, NP-C "

## 2024-11-12 NOTE — Anesthesia Preprocedure Evaluation (Signed)
"                                    Anesthesia Evaluation  Patient identified by MRN, date of birth, ID band Patient awake    Reviewed: Allergy & Precautions, NPO status , Patient's Chart, lab work & pertinent test results  History of Anesthesia Complications Negative for: history of anesthetic complications  Airway Mallampati: II  TM Distance: >3 FB     Dental  (+) Dental Advisory Given   Pulmonary sleep apnea (does not use CPAP)    breath sounds clear to auscultation       Cardiovascular hypertension, (-) angina  Rhythm:Regular Rate:Normal     Neuro/Psych   Anxiety Depression    negative neurological ROS     GI/Hepatic Neg liver ROS,GERD  Medicated and Controlled,,  Endo/Other  diabetes (glu 110), Oral Hypoglycemic AgentsHypothyroidism  Zepbound  BMI 36  Renal/GU Renal InsufficiencyRenal disease     Musculoskeletal  (+) Arthritis ,    Abdominal   Peds  Hematology  (+) Blood dyscrasia (Hb 10.9, plt 415k), anemia   Anesthesia Other Findings   Reproductive/Obstetrics                              Anesthesia Physical Anesthesia Plan  ASA: 3  Anesthesia Plan: General   Post-op Pain Management: Tylenol  PO (pre-op)*   Induction: Intravenous  PONV Risk Score and Plan: 3 and Ondansetron , Dexamethasone  and Scopolamine  patch - Pre-op  Airway Management Planned: Oral ETT  Additional Equipment: None  Intra-op Plan:   Post-operative Plan: Extubation in OR  Informed Consent: I have reviewed the patients History and Physical, chart, labs and discussed the procedure including the risks, benefits and alternatives for the proposed anesthesia with the patient or authorized representative who has indicated his/her understanding and acceptance.     Dental advisory given  Plan Discussed with: CRNA and Surgeon  Anesthesia Plan Comments:          Anesthesia Quick Evaluation  "

## 2024-11-13 ENCOUNTER — Inpatient Hospital Stay (HOSPITAL_COMMUNITY): Payer: Self-pay | Admitting: Anesthesiology

## 2024-11-13 ENCOUNTER — Encounter (HOSPITAL_COMMUNITY): Payer: Self-pay | Admitting: Anesthesiology

## 2024-11-13 ENCOUNTER — Inpatient Hospital Stay (HOSPITAL_COMMUNITY)
Admission: RE | Admit: 2024-11-13 | Discharge: 2024-11-14 | DRG: 661 | Disposition: A | Discharge status: Home / Self Care | Source: Ambulatory Visit | Attending: Urology | Admitting: Urology

## 2024-11-13 ENCOUNTER — Encounter (HOSPITAL_COMMUNITY): Admission: RE | Disposition: A | Payer: Self-pay | Source: Ambulatory Visit | Attending: Urology

## 2024-11-13 ENCOUNTER — Other Ambulatory Visit: Payer: Self-pay

## 2024-11-13 ENCOUNTER — Encounter (HOSPITAL_COMMUNITY): Payer: Self-pay | Admitting: Urology

## 2024-11-13 DIAGNOSIS — E119 Type 2 diabetes mellitus without complications: Secondary | ICD-10-CM | POA: Diagnosis not present

## 2024-11-13 DIAGNOSIS — N261 Atrophy of kidney (terminal): Secondary | ICD-10-CM

## 2024-11-13 DIAGNOSIS — E739 Lactose intolerance, unspecified: Secondary | ICD-10-CM | POA: Diagnosis present

## 2024-11-13 DIAGNOSIS — Z91030 Bee allergy status: Secondary | ICD-10-CM | POA: Diagnosis not present

## 2024-11-13 DIAGNOSIS — N136 Pyonephrosis: Secondary | ICD-10-CM | POA: Diagnosis present

## 2024-11-13 DIAGNOSIS — I10 Essential (primary) hypertension: Secondary | ICD-10-CM

## 2024-11-13 HISTORY — PX: ROBOT ASSISTED LAPAROSCOPIC NEPHRECTOMY: SHX5140

## 2024-11-13 LAB — TYPE AND SCREEN
ABO/RH(D): O POS
Antibody Screen: NEGATIVE

## 2024-11-13 LAB — HEMOGLOBIN AND HEMATOCRIT, BLOOD
HCT: 29.4 % — ABNORMAL LOW (ref 36.0–46.0)
Hemoglobin: 9.1 g/dL — ABNORMAL LOW (ref 12.0–15.0)

## 2024-11-13 LAB — GLUCOSE, CAPILLARY
Glucose-Capillary: 110 mg/dL — ABNORMAL HIGH (ref 70–99)
Glucose-Capillary: 139 mg/dL — ABNORMAL HIGH (ref 70–99)
Glucose-Capillary: 119 mg/dL — ABNORMAL HIGH (ref 70–99)

## 2024-11-13 MED ORDER — PANTOPRAZOLE SODIUM 40 MG PO TBEC
40.0000 mg | DELAYED_RELEASE_TABLET | Freq: Every day | ORAL | Status: DC
  Administered 2024-11-13 – 2024-11-14 (×2): 40 mg via ORAL
  Filled 2024-11-13 (×2): qty 1

## 2024-11-13 MED ORDER — SCOPOLAMINE 1 MG/3DAYS TD PT72
1.0000 | MEDICATED_PATCH | TRANSDERMAL | Status: DC
  Administered 2024-11-13: 1 mg via TRANSDERMAL
  Filled 2024-11-13: qty 1

## 2024-11-13 MED ORDER — HEMOSTATIC AGENTS (NO CHARGE) OPTIME
TOPICAL | Status: DC | PRN
  Administered 2024-11-13: 1 via TOPICAL

## 2024-11-13 MED ORDER — ACETAMINOPHEN 500 MG PO TABS
1000.0000 mg | ORAL_TABLET | Freq: Once | ORAL | Status: AC
  Administered 2024-11-13: 1000 mg via ORAL
  Filled 2024-11-13: qty 2

## 2024-11-13 MED ORDER — EPHEDRINE SULFATE (PRESSORS) 25 MG/5ML IV SOSY
PREFILLED_SYRINGE | INTRAVENOUS | Status: DC | PRN
  Administered 2024-11-13: 10 mg via INTRAVENOUS

## 2024-11-13 MED ORDER — LOSARTAN POTASSIUM 25 MG PO TABS
25.0000 mg | ORAL_TABLET | Freq: Every day | ORAL | Status: DC
  Administered 2024-11-14: 25 mg via ORAL
  Filled 2024-11-13: qty 1

## 2024-11-13 MED ORDER — PROPOFOL 10 MG/ML IV BOLUS
INTRAVENOUS | Status: DC | PRN
  Administered 2024-11-13: 140 mg via INTRAVENOUS
  Administered 2024-11-13: 60 mg via INTRAVENOUS

## 2024-11-13 MED ORDER — ONDANSETRON HCL 4 MG/2ML IJ SOLN: 4.0000 mg | INTRAMUSCULAR | Status: DC | PRN

## 2024-11-13 MED ORDER — HYDROCODONE-ACETAMINOPHEN 5-325 MG PO TABS: 1.0000 | ORAL_TABLET | Freq: Four times a day (QID) | ORAL | 0 refills | Status: AC | PRN

## 2024-11-13 MED ORDER — FLUOXETINE HCL 20 MG PO CAPS
40.0000 mg | ORAL_CAPSULE | Freq: Every day | ORAL | Status: DC
  Administered 2024-11-14: 40 mg via ORAL
  Filled 2024-11-13: qty 2

## 2024-11-13 MED ORDER — ACETAMINOPHEN 10 MG/ML IV SOLN
1000.0000 mg | Freq: Four times a day (QID) | INTRAVENOUS | Status: AC
  Administered 2024-11-13 – 2024-11-14 (×4): 1000 mg via INTRAVENOUS
  Filled 2024-11-13 (×4): qty 100

## 2024-11-13 MED ORDER — ROSUVASTATIN CALCIUM 10 MG PO TABS
10.0000 mg | ORAL_TABLET | Freq: Every evening | ORAL | Status: DC
  Administered 2024-11-13: 10 mg via ORAL
  Filled 2024-11-13: qty 1

## 2024-11-13 MED ORDER — BUPIVACAINE LIPOSOME 1.3 % IJ SUSP
INTRAMUSCULAR | Status: AC
  Filled 2024-11-13: qty 20

## 2024-11-13 MED ORDER — HYDROMORPHONE HCL 1 MG/ML IJ SOLN
INTRAMUSCULAR | Status: AC
  Filled 2024-11-13: qty 1

## 2024-11-13 MED ORDER — PHENYLEPHRINE HCL-NACL 20-0.9 MG/250ML-% IV SOLN
INTRAVENOUS | Status: DC | PRN
  Administered 2024-11-13: 40 ug/min via INTRAVENOUS

## 2024-11-13 MED ORDER — DEXAMETHASONE SOD PHOSPHATE PF 10 MG/ML IJ SOLN
INTRAMUSCULAR | Status: DC | PRN
  Administered 2024-11-13: 4 mg via INTRAVENOUS

## 2024-11-13 MED ORDER — SODIUM CHLORIDE (PF) 0.9 % IJ SOLN
INTRAMUSCULAR | Status: DC | PRN
  Administered 2024-11-13: 20 mL

## 2024-11-13 MED ORDER — ALUM & MAG HYDROXIDE-SIMETH 200-200-20 MG/5ML PO SUSP
30.0000 mL | Freq: Four times a day (QID) | ORAL | Status: DC | PRN
  Administered 2024-11-13: 30 mL via ORAL
  Filled 2024-11-13: qty 30

## 2024-11-13 MED ORDER — LIDOCAINE HCL (CARDIAC) PF 100 MG/5ML IV SOSY
PREFILLED_SYRINGE | INTRAVENOUS | Status: DC | PRN
  Administered 2024-11-13: 40 mg via INTRAVENOUS

## 2024-11-13 MED ORDER — MIDAZOLAM HCL 5 MG/5ML IJ SOLN
INTRAMUSCULAR | Status: DC | PRN
  Administered 2024-11-13: 2 mg via INTRAVENOUS

## 2024-11-13 MED ORDER — CHLORHEXIDINE GLUCONATE 0.12 % MT SOLN
15.0000 mL | Freq: Once | OROMUCOSAL | Status: AC
  Administered 2024-11-13: 15 mL via OROMUCOSAL

## 2024-11-13 MED ORDER — DOCUSATE SODIUM 100 MG PO CAPS
100.0000 mg | ORAL_CAPSULE | Freq: Two times a day (BID) | ORAL | Status: DC
  Administered 2024-11-13 – 2024-11-14 (×2): 100 mg via ORAL
  Filled 2024-11-13 (×2): qty 1

## 2024-11-13 MED ORDER — DOCUSATE SODIUM 100 MG PO CAPS: 100.0000 mg | ORAL_CAPSULE | Freq: Two times a day (BID) | ORAL | Status: AC

## 2024-11-13 MED ORDER — AMISULPRIDE (ANTIEMETIC) 5 MG/2ML IV SOLN
10.0000 mg | Freq: Once | INTRAVENOUS | Status: AC
  Administered 2024-11-13: 10 mg via INTRAVENOUS

## 2024-11-13 MED ORDER — PROPOFOL 10 MG/ML IV BOLUS
INTRAVENOUS | Status: AC
  Filled 2024-11-13: qty 20

## 2024-11-13 MED ORDER — STERILE WATER FOR IRRIGATION IR SOLN
Status: DC | PRN
  Administered 2024-11-13: 1000 mL

## 2024-11-13 MED ORDER — ONDANSETRON HCL 4 MG/2ML IJ SOLN
INTRAMUSCULAR | Status: DC | PRN
  Administered 2024-11-13: 4 mg via INTRAVENOUS

## 2024-11-13 MED ORDER — DIPHENHYDRAMINE HCL 50 MG/ML IJ SOLN: 12.5000 mg | Freq: Four times a day (QID) | INTRAMUSCULAR | Status: DC | PRN

## 2024-11-13 MED ORDER — CEFAZOLIN SODIUM-DEXTROSE 2-4 GM/100ML-% IV SOLN
2.0000 g | INTRAVENOUS | Status: AC
  Administered 2024-11-13: 2 g via INTRAVENOUS
  Filled 2024-11-13: qty 100

## 2024-11-13 MED ORDER — GLYCOPYRROLATE 0.2 MG/ML IJ SOLN
INTRAMUSCULAR | Status: DC | PRN
  Administered 2024-11-13: .1 mg via INTRAVENOUS

## 2024-11-13 MED ORDER — HYDROMORPHONE HCL 1 MG/ML IJ SOLN
0.2500 mg | INTRAMUSCULAR | Status: DC | PRN
  Administered 2024-11-13 (×2): 0.5 mg via INTRAVENOUS

## 2024-11-13 MED ORDER — INSULIN ASPART 100 UNIT/ML IJ SOLN: 0.0000 [IU] | INTRAMUSCULAR | Status: DC | PRN

## 2024-11-13 MED ORDER — FLUTICASONE FUROATE-VILANTEROL 100-25 MCG/ACT IN AEPB
1.0000 | INHALATION_SPRAY | Freq: Every day | RESPIRATORY_TRACT | Status: DC
  Filled 2024-11-13: qty 28

## 2024-11-13 MED ORDER — LEVOTHYROXINE SODIUM 75 MCG PO TABS
75.0000 ug | ORAL_TABLET | Freq: Every day | ORAL | Status: DC
  Administered 2024-11-14: 75 ug via ORAL
  Filled 2024-11-13: qty 1
  Filled 2024-11-13: qty 3

## 2024-11-13 MED ORDER — BUPIVACAINE LIPOSOME 1.3 % IJ SUSP
INTRAMUSCULAR | Status: DC | PRN
  Administered 2024-11-13: 20 mL

## 2024-11-13 MED ORDER — OXYCODONE HCL 5 MG PO TABS
5.0000 mg | ORAL_TABLET | ORAL | Status: DC | PRN
  Administered 2024-11-13 – 2024-11-14 (×4): 5 mg via ORAL
  Filled 2024-11-13 (×4): qty 1

## 2024-11-13 MED ORDER — OXYCODONE HCL 5 MG PO TABS: 5.0000 mg | ORAL_TABLET | Freq: Once | ORAL | Status: DC | PRN

## 2024-11-13 MED ORDER — MIDAZOLAM HCL 2 MG/2ML IJ SOLN
INTRAMUSCULAR | Status: AC
  Filled 2024-11-13: qty 2

## 2024-11-13 MED ORDER — PHENYLEPHRINE HCL-NACL 20-0.9 MG/250ML-% IV SOLN
INTRAVENOUS | Status: AC
  Filled 2024-11-13: qty 250

## 2024-11-13 MED ORDER — OXYCODONE HCL 5 MG/5ML PO SOLN: 5.0000 mg | Freq: Once | ORAL | Status: DC | PRN

## 2024-11-13 MED ORDER — FENTANYL CITRATE (PF) 100 MCG/2ML IJ SOLN
INTRAMUSCULAR | Status: DC | PRN
  Administered 2024-11-13: 100 ug via INTRAVENOUS

## 2024-11-13 MED ORDER — ALBUMIN HUMAN 5 % IV SOLN: INTRAVENOUS | Status: DC | PRN

## 2024-11-13 MED ORDER — HYOSCYAMINE SULFATE 0.125 MG SL SUBL: 0.1250 mg | SUBLINGUAL_TABLET | SUBLINGUAL | Status: DC | PRN

## 2024-11-13 MED ORDER — SUGAMMADEX SODIUM 200 MG/2ML IV SOLN
INTRAVENOUS | Status: AC
  Filled 2024-11-13: qty 2

## 2024-11-13 MED ORDER — DIPHENHYDRAMINE HCL 12.5 MG/5ML PO ELIX: 12.5000 mg | ORAL_SOLUTION | Freq: Four times a day (QID) | ORAL | Status: DC | PRN

## 2024-11-13 MED ORDER — ORAL CARE MOUTH RINSE: 15.0000 mL | Freq: Once | OROMUCOSAL | Status: AC

## 2024-11-13 MED ORDER — LIDOCAINE HCL (PF) 2 % IJ SOLN
INTRAMUSCULAR | Status: AC
  Filled 2024-11-13: qty 5

## 2024-11-13 MED ORDER — MIDAZOLAM HCL (PF) 2 MG/2ML IJ SOLN: 0.5000 mg | Freq: Once | INTRAMUSCULAR | Status: DC | PRN

## 2024-11-13 MED ORDER — ROCURONIUM BROMIDE 10 MG/ML (PF) SYRINGE
PREFILLED_SYRINGE | INTRAVENOUS | Status: AC
  Filled 2024-11-13: qty 10

## 2024-11-13 MED ORDER — AMISULPRIDE (ANTIEMETIC) 5 MG/2ML IV SOLN
INTRAVENOUS | Status: AC
  Filled 2024-11-13: qty 4

## 2024-11-13 MED ORDER — SUGAMMADEX SODIUM 200 MG/2ML IV SOLN
INTRAVENOUS | Status: DC | PRN
  Administered 2024-11-13: 200 mg via INTRAVENOUS

## 2024-11-13 MED ORDER — PHENYLEPHRINE 80 MCG/ML (10ML) SYRINGE FOR IV PUSH (FOR BLOOD PRESSURE SUPPORT)
PREFILLED_SYRINGE | INTRAVENOUS | Status: DC | PRN
  Administered 2024-11-13 (×3): 160 ug via INTRAVENOUS

## 2024-11-13 MED ORDER — LACTATED RINGERS IV SOLN: INTRAVENOUS | Status: DC

## 2024-11-13 MED ORDER — DEXAMETHASONE SOD PHOSPHATE PF 10 MG/ML IJ SOLN
INTRAMUSCULAR | Status: AC
  Filled 2024-11-13: qty 1

## 2024-11-13 MED ORDER — INSULIN ASPART 100 UNIT/ML IJ SOLN
0.0000 [IU] | Freq: Three times a day (TID) | INTRAMUSCULAR | Status: DC
  Administered 2024-11-13: 2 [IU] via SUBCUTANEOUS
  Filled 2024-11-13: qty 2

## 2024-11-13 MED ORDER — ONDANSETRON HCL 4 MG/2ML IJ SOLN
INTRAMUSCULAR | Status: AC
  Filled 2024-11-13: qty 2

## 2024-11-13 MED ORDER — FENTANYL CITRATE (PF) 100 MCG/2ML IJ SOLN
INTRAMUSCULAR | Status: AC
  Filled 2024-11-13: qty 2

## 2024-11-13 MED ORDER — BUSPIRONE HCL 5 MG PO TABS: 5.0000 mg | ORAL_TABLET | Freq: Two times a day (BID) | ORAL | Status: DC | PRN

## 2024-11-13 MED ORDER — MECLIZINE HCL 25 MG PO TABS: 25.0000 mg | ORAL_TABLET | Freq: Three times a day (TID) | ORAL | Status: DC | PRN

## 2024-11-13 MED ORDER — ROCURONIUM BROMIDE 100 MG/10ML IV SOLN
INTRAVENOUS | Status: DC | PRN
  Administered 2024-11-13: 10 mg via INTRAVENOUS
  Administered 2024-11-13: 60 mg via INTRAVENOUS
  Administered 2024-11-13: 40 mg via INTRAVENOUS

## 2024-11-13 MED ORDER — SODIUM CHLORIDE 0.45 % IV SOLN: INTRAVENOUS | Status: DC

## 2024-11-13 MED ORDER — HYDROMORPHONE HCL 1 MG/ML IJ SOLN
0.5000 mg | INTRAMUSCULAR | Status: DC | PRN
  Administered 2024-11-13: 1 mg via INTRAVENOUS

## 2024-11-13 MED ORDER — BUPROPION HCL ER (XL) 300 MG PO TB24
300.0000 mg | ORAL_TABLET | Freq: Every morning | ORAL | Status: DC
  Administered 2024-11-14: 300 mg via ORAL
  Filled 2024-11-13: qty 1

## 2024-11-13 MED ORDER — MEPERIDINE HCL 25 MG/ML IJ SOLN: 6.2500 mg | INTRAMUSCULAR | Status: DC | PRN

## 2024-11-13 NOTE — Interval H&P Note (Signed)
 History and Physical Interval Note:  11/13/2024 8:01 AM  Melissa [name]  has presented today for surgery, with the diagnosis of LEFT RENAL ATROPHY.  The various methods of treatment have been discussed with the patient and family. After consideration of risks, benefits and other options for treatment, the patient has consented to  Procedures with comments: NEPHRECTOMY, RADICAL, ROBOT-ASSISTED, LAPAROSCOPIC, ADULT (Left) - LEFT ROBOTIC RADICAL NEPHRECTOMY as a surgical intervention.  The patient's history has been reviewed, patient examined, no change in status, stable for surgery.  I have reviewed the patient's chart and labs.  Questions were answered to the patient's satisfaction.     Lonni Righter Makell Cyr

## 2024-11-13 NOTE — Anesthesia Procedure Notes (Signed)
 Procedure Name: Intubation Date/Time: 11/13/2024 8:37 AM  Performed by: Erick Fitz, CRNAPre-anesthesia Checklist: Patient identified, Emergency Drugs available, Suction available, Patient being monitored and Timeout performed Patient Re-evaluated:Patient Re-evaluated prior to induction Oxygen Delivery Method: Circle system utilized Preoxygenation: Pre-oxygenation with 100% oxygen Induction Type: IV induction Ventilation: Mask ventilation without difficulty Laryngoscope Size: Mac and 3 Grade View: Grade I Tube type: Oral Tube size: 7.0 mm Number of attempts: 1 Airway Equipment and Method: Stylet Placement Confirmation: ETT inserted through vocal cords under direct vision, positive ETCO2, CO2 detector and breath sounds checked- equal and bilateral Secured at: 22 cm Tube secured with: Tape (secured with pink Hy-tape) Dental Injury: Teeth and Oropharynx as per pre-operative assessment

## 2024-11-13 NOTE — Transfer of Care (Signed)
 Immediate Anesthesia Transfer of Care Note  Patient: Melissa [name]  Procedure(s) Performed: NEPHRECTOMY, RADICAL, ROBOT-ASSISTED, LAPAROSCOPIC, ADULT (Left)  Patient Location: PACU  Anesthesia Type:General  Level of Consciousness: awake, alert , oriented, and patient cooperative  Airway & Oxygen Therapy: Patient Spontanous Breathing and Patient connected to face mask oxygen  Post-op Assessment: Report given to RN and Post -op Vital signs reviewed and stable  Post vital signs: Reviewed and stable  Last Vitals:  Vitals Value Taken Time  BP 140/77 11/13/24 11:19  Temp 36.5 C 11/13/24 11:19  Pulse 73 11/13/24 11:22  Resp 21 11/13/24 11:22  SpO2 100 % 11/13/24 11:22  Vitals shown include unfiled device data.  Last Pain:  Vitals:   11/13/24 1119  TempSrc:   PainSc: Asleep         Complications: There were no known notable events for this encounter.

## 2024-11-13 NOTE — Anesthesia Postprocedure Evaluation (Signed)
"   Anesthesia Post Note  Patient: Melissa [name]  Procedure(s) Performed: NEPHRECTOMY, RADICAL, ROBOT-ASSISTED, LAPAROSCOPIC, ADULT (Left)     Patient location during evaluation: PACU Anesthesia Type: General Level of consciousness: awake and alert, oriented and patient cooperative Pain management: pain level controlled Vital Signs Assessment: post-procedure vital signs reviewed and stable Respiratory status: spontaneous breathing, nonlabored ventilation and respiratory function stable Cardiovascular status: blood pressure returned to baseline and stable Postop Assessment: no apparent nausea or vomiting Anesthetic complications: no   There were no known notable events for this encounter.  Last Vitals:  Vitals:   11/13/24 1300 11/13/24 1400  BP: (!) 142/79 (!) 159/83  Pulse: 81 84  Resp: 17 20  Temp:    SpO2: 100% 100%    Last Pain:  Vitals:   11/13/24 1415  TempSrc:   PainSc: 4                  [name],[name]      "

## 2024-11-13 NOTE — Op Note (Signed)
 Operative Note  Preoperative diagnosis:  1.  Atrophic left kidney from chronic left UPJ obstruction  Postoperative diagnosis: 1.  Atrophic left kidney from chronic left UPJ obstruction  Procedure(s): 1.  Robotic assisted laparoscopic left radical nephrectomy (not adrenal sparing)  Surgeon: Lonni Han, MD  Assistants: 1.  Alan Hammonds, PA-C An assistant was required for this surgical procedure.  The duties of the assistant included but were not limited to suctioning, passing suture, camera manipulation, retraction.  This procedure would not be able to be performed without an geophysicist/field seismologist.  2.  Clifm Cobb, MD PGY4  Anesthesia:  General  Complications:  None  EBL: 50 mL  Specimens: 1.  Left kidney and adrenal gland  Drains/Catheters: 1.  Foley catheter  Intraoperative findings:   Atrophic left kidney The left renal hilum was hemostatic following staple ligation  Indication:  ALEIDA CRANDELL is a 59 y.o. female with an atrophic left kidney secondary to a chronic left UPJ obstruction causing recurrent episodes of pyelonephritis.  She has been consented for the above procedures, voices understanding and wishes to proceed.  Description of procedure:  After informed consent was obtained, the patient was brought to the operating room and general endotracheal anesthesia was administered.  The patient was then placed in the right lateral decubitus position and prepped and draped in usual sterile fashion.  A timeout was performed.  An 8 mm incision was then made lateral to the left rectus muscle at the level of the left 12th rib.  Abdominal access was obtained via a Veress needle.  The abdominal cavity was then insufflated up to 15 mmHg.  An 8 mm port was then introduced into the abdominal cavity.  Inspection of the port entry site by the robotic camera revealed no adjacent organ injury.  We then placed 3 additional 8 mm robotic ports to triangulate the left renal hilum.  A 12 mm  assistant port was then placed between the carmera port and 3rd robotic arm.  The white line of Toldt along the descending colon was incised sharply and the colon, along with its mesocolonic fat, was reflected medially until the aorta was identified.  We then made a small window adjacent to the lower pole of the left kidney, identifying the left psoas muscle, left ureter and left gonadal vein.  The left ureter and gonadal vein were then reflected anteriorly allowing us  to then incised the perihilar attachments using electrocautery.  We encountered a small lumbar vein adjacent to the insertion of the left gonadal vein into the left renal vein.  This lumbar vein was ligated with hemo-lock clips in 2 places and incised sharply.  This provided us  excellent exposure to the left renal hilum.    A 45 mm endovascular stapler was then used to ligate the left renal artery and then the left renal vein, achieving excellent hemostasis.  The remaining peri-renal attachments were then excised using a combination of blunt dissection and electrocautery.  The left adrenal gland was not spared.  The endovascular stapler was then used to ligate the left gonadal vein and left ureter.  Once the kidney was freely mobile, it was placed in Endo Catch bag to be be retrieved at the conclusion of the case.  The robot was then de-docked.  A left lower quadrant Gibson incision was then made and the mass was removed within the Endo Catch bag.  The fascia within the midline assistant port was then closed using an interrupted 0 Vicryl suture.  The fascia  of the internal and external oblique was then closed using a 0 PDS suture in a running fashion.  The subcutaneous tissue within the [facility] incision was then closed using a running 0 Vicryl suture.  All skin incisions were then closed using 4-0 Monocryl and then dressed with Dermabond.  The patient tolerated the procedure well and was transferred to the postanesthesia in stable condition.      Plan:  Monitor on the floor

## 2024-11-13 NOTE — Discharge Instructions (Signed)
"   Activity:  You are encouraged to ambulate frequently (about every hour during waking hours) to help prevent blood clots from forming in your legs or lungs.  However, you should not engage in any heavy lifting (> 10-15 lbs), strenuous activity, or straining. Diet: You should advance your diet as instructed by your physician.  It will be normal to have some bloating, nausea, and abdominal discomfort intermittently. Prescriptions:  You will be provided a prescription for pain medication to take as needed.  If your pain is not severe enough to require the prescription pain medication, you may take extra strength Tylenol  instead which will have less side effects.  You should also take a prescribed stool softener to avoid straining with bowel movements as the prescription pain medication may constipate you. Incisions: You may remove your dressing bandages 48 hours after surgery if not removed in the hospital.  You will either have some small staples or special tissue glue at each of the incision sites. Once the bandages are removed (if present), the incisions may stay open to air.  You may start showering (but not soaking or bathing in water ) the 2nd day after surgery and the incisions simply need to be patted dry after the shower.  No additional care is needed. What to call us  about: You should call the office [phone]) if you develop fever > 101 or develop persistent vomiting.   You may resume zepbound , aspirin , advil , aleve, vitamins, and supplements 7 days after surgery. "

## 2024-11-14 ENCOUNTER — Encounter (HOSPITAL_COMMUNITY): Payer: Self-pay | Admitting: Urology

## 2024-11-14 LAB — HEMOGLOBIN AND HEMATOCRIT, BLOOD
HCT: 32.6 % — ABNORMAL LOW (ref 36.0–46.0)
Hemoglobin: 10.4 g/dL — ABNORMAL LOW (ref 12.0–15.0)

## 2024-11-14 LAB — BASIC METABOLIC PANEL WITH GFR
Anion gap: 9 (ref 5–15)
BUN: 13 mg/dL (ref 6–20)
CO2: 25 mmol/L (ref 22–32)
Calcium: 9 mg/dL (ref 8.9–10.3)
Chloride: 103 mmol/L (ref 98–111)
Creatinine, Ser: 0.79 mg/dL (ref 0.44–1.00)
GFR, Estimated: >60 mL/min
Glucose, Bld: 106 mg/dL — ABNORMAL HIGH (ref 70–99)
Potassium: 3.9 mmol/L (ref 3.5–5.1)
Sodium: 136 mmol/L (ref 135–145)

## 2024-11-14 LAB — GLUCOSE, CAPILLARY
Glucose-Capillary: 107 mg/dL — ABNORMAL HIGH (ref 70–99)
Glucose-Capillary: 110 mg/dL — ABNORMAL HIGH (ref 70–99)

## 2024-11-14 NOTE — Progress Notes (Signed)
" °   11/14/24 1116  TOC Brief Assessment  Insurance and Status Reviewed  Patient has primary care physician Yes  Home environment has been reviewed home with self  Prior level of function: independent  Prior/Current Home Services No current home services  Social Drivers of Health Review SDOH reviewed no interventions necessary  Readmission risk has been reviewed Yes  Transition of care needs no transition of care needs at this time    "

## 2024-11-14 NOTE — Discharge Summary (Signed)
 Date of admission: 11/13/2024  Date of discharge: 11/14/2024  Admission diagnosis: Atrophic left kidney from chronic left UPJ obstruction   Discharge diagnosis: Atrophic left kidney from chronic left UPJ obstruction   Secondary diagnoses: None  History and Physical: For full details, please see admission history and physical. Briefly, Melissa [name] is a 59 y.o. year old patient with Atrophic left kidney from chronic left UPJ obstruction .   Hospital Course:  Patient underwent uncomplicated left radical nephrectomy on 1/21, she was extubated without difficulty and transferred to the floor for routine post-operative management. By POD1 she was ambulating without difficulty, voiding spontaneously, pain controlled with oral medications, tolerating a regular diet. She has follow-up scheduled  Laboratory values:  Recent Labs    11/13/24 1152 11/14/24 0414  HGB 9.1* 10.4*  HCT 29.4* 32.6*   Recent Labs    11/14/24 0414  CREATININE 0.79    Disposition: Home  Discharge instruction: The patient was instructed to be ambulatory but told to refrain from heavy lifting, strenuous activity, or driving.   Discharge medications:  Allergies as of 11/14/2024       Reactions   Bee Venom Hives, Swelling, Other (See Comments)   Welts, too   Lactose Intolerance (gi) Other (See Comments)   Constipation        Medication List     STOP taking these medications    aspirin  EC 81 MG tablet   cephALEXin  250 MG capsule Commonly known as: KEFLEX    Zepbound  7.5 MG/0.5ML Pen Generic drug: tirzepatide        TAKE these medications    acetaminophen  500 MG tablet Commonly known as: TYLENOL  Take 2 tablets (1,000 mg total) by mouth every 6 (six) hours as needed (pain). What changed:  how much to take when to take this reasons to take this   Artificial Tears ophthalmic solution Place 1 drop into both eyes 4 (four) times daily as needed (for irritation).   budesonide-formoterol  80-4.5  MCG/ACT inhaler Commonly known as: SYMBICORT Inhale 2 puffs into the lungs 2 (two) times daily as needed (for respiratory flares).   buPROPion  300 MG 24 hr tablet Commonly known as: WELLBUTRIN  XL Take 300 mg by mouth every morning.   busPIRone  5 MG tablet Commonly known as: BUSPAR  Take 5 mg by mouth 2 (two) times daily as needed (anxiety).   diphenhydrAMINE  25 MG tablet Commonly known as: BENADRYL  Take 75 mg by mouth at bedtime.   docusate sodium  100 MG capsule Commonly known as: COLACE Take 1 capsule (100 mg total) by mouth 2 (two) times daily.   EPINEPHrine  0.3 mg/0.3 mL Soaj injection Commonly known as: EPI-PEN Inject 0.3 mg into the muscle as needed for anaphylaxis.   FLUoxetine  40 MG capsule Commonly known as: PROZAC  Take 40 mg by mouth daily.   HYDROcodone -acetaminophen  5-325 MG tablet Commonly known as: NORCO/VICODIN Take 1-2 tablets by mouth every 6 (six) hours as needed for moderate pain (pain score 4-6) or severe pain (pain score 7-10).   levothyroxine  75 MCG tablet Commonly known as: SYNTHROID  Take 75 mcg by mouth daily before breakfast.   loratadine  10 MG tablet Commonly known as: CLARITIN  Take 10 mg by mouth in the morning.   losartan  25 MG tablet Commonly known as: COZAAR  Take 25 mg by mouth daily.   meclizine  25 MG tablet Commonly known as: ANTIVERT  Take 25 mg by mouth 3 (three) times daily as needed for dizziness.   metFORMIN  500 MG tablet Commonly known as: GLUCOPHAGE  1 tab at  lunch 1 tab at dinner   Misc. Devices Misc DX   Hypertension         RX   Extra Large blood pressure cuff   montelukast  10 MG tablet Commonly known as: SINGULAIR  Take 10 mg by mouth at bedtime.   omeprazole 40 MG capsule Commonly known as: PRILOSEC Take 40 mg by mouth at bedtime.   rosuvastatin  10 MG tablet Commonly known as: CRESTOR  Take 10 mg by mouth every evening.   triamcinolone  cream 0.5 % Commonly known as: KENALOG  Apply 1 Application topically daily  as needed (itching- affected areas).        Followup:   Follow-up Information     Buck Rodena BIRCH, NP Follow up on 11/27/2024.   Specialty: Urology Why: at 9:15 Contact information: [address] [place] [address] [phone]

## 2024-11-18 LAB — SURGICAL PATHOLOGY

## 2024-11-27 ENCOUNTER — Emergency Department (HOSPITAL_COMMUNITY)

## 2024-11-27 ENCOUNTER — Emergency Department (HOSPITAL_COMMUNITY)
Admission: EM | Admit: 2024-11-27 | Discharge: 2024-11-27 | Disposition: A | Discharge status: Home / Self Care | Source: Ambulatory Visit | Attending: Emergency Medicine | Admitting: Emergency Medicine

## 2024-11-27 DIAGNOSIS — Z79899 Other long term (current) drug therapy: Secondary | ICD-10-CM | POA: Insufficient documentation

## 2024-11-27 DIAGNOSIS — R000 Tachycardia, unspecified: Secondary | ICD-10-CM | POA: Insufficient documentation

## 2024-11-27 DIAGNOSIS — R0602 Shortness of breath: Secondary | ICD-10-CM | POA: Insufficient documentation

## 2024-11-27 DIAGNOSIS — I10 Essential (primary) hypertension: Secondary | ICD-10-CM | POA: Insufficient documentation

## 2024-11-27 DIAGNOSIS — E039 Hypothyroidism, unspecified: Secondary | ICD-10-CM | POA: Insufficient documentation

## 2024-11-27 DIAGNOSIS — R1032 Left lower quadrant pain: Secondary | ICD-10-CM | POA: Insufficient documentation

## 2024-11-27 LAB — COMPREHENSIVE METABOLIC PANEL WITH GFR
ALT: 15 U/L (ref 0–44)
AST: 16 U/L (ref 15–41)
Albumin: 4.4 g/dL (ref 3.5–5.0)
Alkaline Phosphatase: 125 U/L (ref 38–126)
Anion gap: 12 (ref 5–15)
BUN: 27 mg/dL — ABNORMAL HIGH (ref 6–20)
CO2: 24 mmol/L (ref 22–32)
Calcium: 10 mg/dL (ref 8.9–10.3)
Chloride: 101 mmol/L (ref 98–111)
Creatinine, Ser: 1.05 mg/dL — ABNORMAL HIGH (ref 0.44–1.00)
GFR, Estimated: >60 mL/min
Glucose, Bld: 124 mg/dL — ABNORMAL HIGH (ref 70–99)
Potassium: 4.4 mmol/L (ref 3.5–5.1)
Sodium: 138 mmol/L (ref 135–145)
Total Bilirubin: 0.3 mg/dL (ref 0.0–1.2)
Total Protein: 7.7 g/dL (ref 6.5–8.1)

## 2024-11-27 LAB — CBC
HCT: 38.1 % (ref 36.0–46.0)
Hemoglobin: 11.8 g/dL — ABNORMAL LOW (ref 12.0–15.0)
MCH: 25.5 pg — ABNORMAL LOW (ref 26.0–34.0)
MCHC: 31 g/dL (ref 30.0–36.0)
MCV: 82.3 fL (ref 80.0–100.0)
Platelets: 550 10*3/uL — ABNORMAL HIGH (ref 150–400)
RBC: 4.63 MIL/uL (ref 3.87–5.11)
RDW: 16 % — ABNORMAL HIGH (ref 11.5–15.5)
WBC: 11.1 10*3/uL — ABNORMAL HIGH (ref 4.0–10.5)
nRBC: 0 % (ref 0.0–0.2)

## 2024-11-27 LAB — RESP PANEL BY RT-PCR (RSV, FLU A&B, COVID)  RVPGX2
Influenza A by PCR: NEGATIVE
Influenza B by PCR: NEGATIVE
Resp Syncytial Virus by PCR: NEGATIVE
SARS Coronavirus 2 by RT PCR: NEGATIVE

## 2024-11-27 MED ORDER — SODIUM CHLORIDE 0.9 % IV BOLUS
1000.0000 mL | Freq: Once | INTRAVENOUS | Status: AC
  Administered 2024-11-27: 1000 mL via INTRAVENOUS

## 2024-11-27 MED ORDER — IOHEXOL 350 MG/ML SOLN
100.0000 mL | Freq: Once | INTRAVENOUS | Status: AC | PRN
  Administered 2024-11-27: 100 mL via INTRAVENOUS

## 2024-11-27 NOTE — ED Triage Notes (Signed)
 Patient had kidney removed 2 weeks ago, was being seen for post op and found to be tachycardic/hypotensive/SOB. Patient is alert and oriented x 4. Airway patent, respirations even and unlabored. Skin normal, warm and dry.

## 2024-11-27 NOTE — Discharge Instructions (Signed)
 It was a pleasure meeting with you today.  As we discussed your lab work and imaging today were reassuring for nonacute process.  It is also reassuring that your tachycardia and shortness of breath improved over time.  Continue to monitor symptoms and if shortness of breath or tachycardia return please return for further evaluation in the ED.

## 2024-11-27 NOTE — ED Provider Notes (Signed)
 " [facility]MENT AT [facility] Provider Note   CSN: [id number] Arrival date & time: 11/27/24  0945     Patient presents with: Shortness of Breath   Melissa [name] is a 59 y.o. female.  With pertinent medical history of status post nephrectomy 2 weeks ago, hypertension, hypothyroidism, OSA, hyperlipidemia.   Patient is here for evaluation of shortness of breath and tachycardia at the recommendation of their urology provider.  Patient is 2 weeks status post nephrectomy.  She was following up with her urology provider and due to current symptoms she was sent here for further evaluation.  Patient reports shortness of breath that began sometime over the weekend.  This is worse with exertion like climbing stairs.  She has also been having tachycardia but denies palpitations or chest pain.  She denies recent cough or congestion.  Denies recent leg swelling or tenderness.  She reports some abdominal pain at surgical scars that has been improving with time and left lower quadrant abdominal pain that feels related to IBS.  Last bowel movement Monday.  Patient took her Zepbound  on Sunday.  She reports decreased appetite and poor fluid intake after taking this medication.  Denies dizziness or syncope.  Denies blood in stool, melena, rectal bleeding, dysuria, foul-smelling urine, or hematuria.  The history is provided by the patient and a caregiver (Received epic chat message from [facility] NP who sent patient over).  Shortness of Breath Severity:  Moderate      Prior to Admission medications  Medication Sig Start Date End Date Taking? Authorizing Provider  acetaminophen  (TYLENOL ) 500 MG tablet Take 2 tablets (1,000 mg total) by mouth every 6 (six) hours as needed (pain). Patient taking differently: Take 1,500-2,000 mg by mouth every 8 (eight) hours as needed for moderate pain (pain score 4-6). 04/17/24   Bryn Bernardino NOVAK, MD  Artificial Tears ophthalmic solution Place  1 drop into both eyes 4 (four) times daily as needed (for irritation).    [provider]  budesonide-formoterol  (SYMBICORT) 80-4.5 MCG/ACT inhaler Inhale 2 puffs into the lungs 2 (two) times daily as needed (for respiratory flares).    [provider]  buPROPion  (WELLBUTRIN  XL) 300 MG 24 hr tablet Take 300 mg by mouth every morning. 09/26/24   [provider]  busPIRone  (BUSPAR ) 5 MG tablet Take 5 mg by mouth 2 (two) times daily as needed (anxiety).    [provider]  diphenhydrAMINE  (BENADRYL ) 25 MG tablet Take 75 mg by mouth at bedtime.    [provider]  docusate sodium  (COLACE) 100 MG capsule Take 1 capsule (100 mg total) by mouth 2 (two) times daily. 11/13/24   Cory Palma, PA-C  EPINEPHrine  0.3 mg/0.3 mL IJ SOAJ injection Inject 0.3 mg into the muscle as needed for anaphylaxis. 02/20/24   Danford, Rockie D, NP  FLUoxetine  (PROZAC ) 40 MG capsule Take 40 mg by mouth daily. 05/06/24   [provider]  HYDROcodone -acetaminophen  (NORCO/VICODIN) 5-325 MG tablet Take 1-2 tablets by mouth every 6 (six) hours as needed for moderate pain (pain score 4-6) or severe pain (pain score 7-10). 11/13/24   Cory Palma, PA-C  levothyroxine  (SYNTHROID ) 75 MCG tablet Take 75 mcg by mouth daily before breakfast. 06/03/24   [provider]  loratadine  (CLARITIN ) 10 MG tablet Take 10 mg by mouth in the morning.    [provider]  losartan  (COZAAR ) 25 MG tablet Take 25 mg by mouth daily.    [provider]  meclizine  (ANTIVERT ) 25 MG tablet Take 25 mg by mouth 3 (three) times daily as needed for dizziness.    [provider]  metFORMIN  (GLUCOPHAGE ) 500 MG tablet 1 tab at lunch 1 tab at dinner 10/10/24   Danford, Rockie BIRCH, NP  Misc. Devices MISC DX   Hypertension         RX   Extra Large blood pressure cuff 06/09/24   [provider]  montelukast  (SINGULAIR ) 10 MG tablet Take 10 mg by mouth at bedtime.    [provider]  omeprazole (PRILOSEC) 40 MG capsule Take 40 mg by mouth at bedtime.     [provider]  rosuvastatin  (CRESTOR ) 10 MG tablet Take 10 mg by mouth every evening. 03/26/24   [provider]  triamcinolone  cream (KENALOG ) 0.5 % Apply 1 Application topically daily as needed (itching- affected areas).    [provider]    Allergies: Bee venom and Lactose intolerance (gi)    Review of Systems  Respiratory:  Positive for shortness of breath.     Vitals:   11/27/24 0955 11/27/24 1213  BP: 101/79 (!) 105/51  Pulse: (!) 133 60  Resp: 20 15  Temp: 98.2 F (36.8 C) 98.1 F (36.7 C)  TempSrc: Oral Oral  SpO2: 100% 100%    Updated Vital Signs BP (!) 105/51 (BP Location: Right Arm)   Pulse 60   Temp 98.1 F (36.7 C) (Oral)   Resp 15   LMP 10/12/2012   SpO2 100%   Physical Exam Vitals and nursing note reviewed.  Constitutional:      Appearance: Normal appearance. She is well-developed. She is not ill-appearing, toxic-appearing or diaphoretic.     Comments: Patient is able to speak in complete sentences, she is noted to be taking deep breaths between sentences.  HENT:     Head: Normocephalic and atraumatic.     Mouth/Throat:     Mouth: Mucous membranes are moist.  Eyes:     General: No scleral icterus.    Extraocular Movements: Extraocular movements intact.     Conjunctiva/sclera: Conjunctivae normal.  Cardiovascular:     Rate and Rhythm: Normal rate and regular rhythm.     Pulses: Normal pulses.     Heart sounds: Normal heart sounds.  Pulmonary:     Effort: Accessory muscle usage present. No tachypnea or respiratory distress.     Breath sounds: Normal breath sounds. No stridor. No decreased breath sounds, wheezing, rhonchi or rales.  Chest:     Chest wall: No tenderness.  Abdominal:     General: Abdomen is flat. Bowel sounds are normal. There is no distension.     Palpations: Abdomen is soft.     Tenderness: There is abdominal tenderness (Mild  tenderness to the left lower quadrant). There is no guarding.     Comments: Surgical scars appear to be healing well without obvious signs of infection.  Musculoskeletal:        General: Normal range of motion.     Cervical back: Normal range of motion.     Right lower leg: No tenderness. No edema.     Left lower leg: No tenderness. No edema.  Skin:    General: Skin is warm and dry.     Capillary Refill: Capillary refill takes less than 2 seconds.     Coloration: Skin is not jaundiced or pale.  Neurological:     Mental Status: She is alert and oriented to person, place, and time.     (  all labs ordered are listed, but only abnormal results are displayed) Labs Reviewed  COMPREHENSIVE METABOLIC PANEL WITH GFR - Abnormal; Notable for the following components:      Result Value   Glucose, Bld 124 (*)    BUN 27 (*)    Creatinine, Ser 1.05 (*)    All other components within normal limits  CBC - Abnormal; Notable for the following components:   WBC 11.1 (*)    Hemoglobin 11.8 (*)    MCH 25.5 (*)    RDW 16.0 (*)    Platelets 550 (*)    All other components within normal limits  RESP PANEL BY RT-PCR (RSV, FLU A&B, COVID)  [id number]    EKG: EKG Interpretation Date/Time:  Wednesday November 27 2024 09:57:34 EST Ventricular Rate:  131 PR Interval:  97 QRS Duration:  167 QT Interval:  415 QTC Calculation: 620 R Axis:   40  Text Interpretation: Sinus or ectopic atrial tachycardia Nonspecific intraventricular conduction delay Consider anterior infarct Confirmed by Dasie Faden ([id number]) on 11/27/2024 10:01:50 AM  Radiology: CT ABDOMEN PELVIS W CONTRAST Result Date: 11/27/2024 CLINICAL DATA:  Recent left nephrectomy atrophic left kidney with chronic left UPJ obstruction, epigastric pain, hypotension, tachycardia EXAM: CT ABDOMEN AND PELVIS WITH CONTRAST TECHNIQUE: Multidetector CT imaging of the abdomen and pelvis was performed using the standard protocol following bolus administration of  intravenous contrast. RADIATION DOSE REDUCTION: This exam was performed according to the departmental dose-optimization program which includes automated exposure control, adjustment of the mA and/or kV according to patient size and/or use of iterative reconstruction technique. CONTRAST:  OMNIPAQUE  IOHEXOL  350 MG/ML SOLN COMPARISON:  04/09/2024 FINDINGS: Lower chest: No acute abnormality. Hepatobiliary: No focal liver abnormality is seen. No gallstones, gallbladder wall thickening, or biliary dilatation. Pancreas: Unremarkable. No pancreatic ductal dilatation or surrounding inflammatory changes. Spleen: Normal in size without focal abnormality. Adrenals/Urinary Tract: Right adrenal gland and kidney demonstrate no acute process. Right kidney has an extrarenal pelvis. No obstructive uropathy. Right ureter nondilated. Bladder unremarkable. Patient is status post recent left adrenalectomy and nephrectomy. Expected postop changes in the surgical bed. No fluid collection, abscess or hematoma. Stomach/Bowel: Stomach is within normal limits. Appendix appears normal. No evidence of bowel wall thickening, distention, or inflammatory changes. No free fluid, fluid collection, hemorrhage, hematoma, or ascites. Vascular/Lymphatic: Aortoiliac atherosclerosis noted. No acute aortic process or aneurysm. Mesenteric and renal vasculature appear patent. No veno-occlusive process. No bulky adenopathy. Reproductive: Uterus and bilateral adnexa are unremarkable. Other: No abdominopelvic ascites. Intact abdominal wall. No ventral hernia. Scattered areas of abdominal wall subcutaneous emphysema anteriorly from the epigastric region, across the left anterior abdomen extending to the left lower quadrant. Small nonspecific subcutaneous air-fluid level in the dependent left lower quadrant, image 50/4. Appearance most likely related to the recent laparoscopic surgery. No large fluid collection, edema or significant inflammatory changes  appreciated. Musculoskeletal: Postop changes of previous lumbar fusion creating artifact. Lumbar spine degenerative changes noted. No acute osseous finding appreciated. IMPRESSION: 1. Status post recent left adrenalectomy and nephrectomy with expected postop changes in the surgical bed. No fluid collection, abscess or hematoma. 2. Scattered areas of abdominal wall subcutaneous emphysema most likely related to the recent laparoscopic surgery. 3. No other acute intra-abdominal or pelvic finding by CT. 4. Aortic atherosclerosis. Aortic Atherosclerosis (ICD10-I70.0). Electronically Signed   By: CHRISTELLA.  Shick M.D.   On: 11/27/2024 12:06   CT Angio Chest PE W and/or Wo Contrast Result Date: 11/27/2024 CLINICAL DATA:  Abdominal pain, status post left  nephrectomy 2 weeks ago 11/13/2024, tachycardia, hypertension shortness of breath, concern for PE EXAM: CT ANGIOGRAPHY CHEST WITH CONTRAST TECHNIQUE: Multidetector CT imaging of the chest was performed using the standard protocol during bolus administration of intravenous contrast. Multiplanar CT image reconstructions and MIPs were obtained to evaluate the vascular anatomy. RADIATION DOSE REDUCTION: This exam was performed according to the departmental dose-optimization program which includes automated exposure control, adjustment of the mA and/or kV according to patient size and/or use of iterative reconstruction technique. CONTRAST:  OMNIPAQUE  IOHEXOL  350 MG/ML SOLN COMPARISON:  11/27/2024 chest x-ray FINDINGS: Cardiovascular: Pulmonary arteries are well opacified and appear patent. No significant filling defect or pulmonary embolus by CTA. Atherosclerotic changes of the thoracic aorta. Left aortic arch noted with an aberrant right subclavian artery coursing posterior to the esophagus and trachea, normal variant. No mediastinal hemorrhage or hematoma. Normal heart size. No pericardial effusion. Central venous structures are patent. No veno-occlusive finding.  Mediastinum/Nodes: Right shoulder arthroplasty creates artifact. No focal thyroid  abnormality. Trachea central airways are patent. Esophagus unremarkable. Small scattered nonenlarged mediastinal lymph nodes. No bulky adenopathy. No hiatal hernia. Lungs/Pleura: Lungs are clear. No pleural effusion or pneumothorax. Upper Abdomen: No acute upper abdominal finding. Abdominal aortic atherosclerosis noted. Postop changes recent left nephrectomy. Anterior epigastric and abdominal wall scattered areas of subcutaneous emphysema, suspect related to the recent left nephrectomy procedure. Musculoskeletal: Endplate degenerative changes throughout the thoracic spine. Remote left shoulder arthroplasty changes. Review of the MIP images confirms the above findings. IMPRESSION: 1. Negative for significant acute pulmonary embolus by CTA. 2. No acute intrathoracic finding. 3. Postop changes of recent left nephrectomy. Anterior abdominal wall subcutaneous emphysema as above. 4. Aortic atherosclerosis. Aortic Atherosclerosis (ICD10-I70.0). Electronically Signed   By: CHRISTELLA.  Shick M.D.   On: 11/27/2024 11:52   DG Chest Port 1 View Result Date: 11/27/2024 CLINICAL DATA:  Shortness of breath EXAM: PORTABLE CHEST 1 VIEW COMPARISON:  January 04, 2010 FINDINGS: The heart size and mediastinal contours are within normal limits. Both lungs are clear. The visualized skeletal structures are unremarkable. IMPRESSION: Normal Electronically Signed   By: Nancyann Burns M.D.   On: 11/27/2024 10:31     Procedures   Medications Ordered in the ED  iohexol  (OMNIPAQUE ) 350 MG/ML injection 100 mL (100 mLs Intravenous Contrast Given 11/27/24 1117)  sodium chloride  0.9 % bolus 1,000 mL (0 mLs Intravenous Stopped 11/27/24 1413)     Patient presents to the ED for concern of shortness of breath and tachycardia in the setting of recent surgery, this involves an extensive number of treatment options, and is a complaint that carries with it a high risk of  complications and morbidity.  The differential diagnosis includes pulmonary embolism, medication related, dehydration, hypovolemia, sepsis, abscess, pneumonia, ACS, deconditioning.   Co morbidities that complicate the patient evaluation  Status post nephrectomy 2 weeks ago Hypertension   Additional history obtained:  Additional history obtained from Outside Medical Records and Past Admission   External records from outside source obtained and reviewed including secure chat message from urology nurse practitioner with patient report and surgical notes.   Lab Tests:  I Ordered, and personally interpreted labs.  The pertinent results include:   Respiratory panel negative   Imaging Studies ordered:  I ordered imaging studies including chest x-ray I independently visualized and interpreted imaging which showed no acute abnormalities I ordered imaging studies including CT angio chest and CT abdomen pelvis with contrast I independently visualized and interpreted imaging which showed negative for significant acute  pulmonary embolus by CTA.  No acute intrathoracic finding.  Status post recent left adrenalectomy and nephrectomy with expected postop changes in the surgical bed.  No fluid collection, abscess, or hematoma.  There are scattered areas of abdominal wall subcutaneous emphysema most likely related to the recent laparoscopic surgery. I agree with the radiologist interpretation   Cardiac Monitoring:  The patient was maintained on a cardiac monitor.  I personally viewed and interpreted the cardiac monitored which showed an underlying rhythm of: Sinus tachycardia with no evidence of STEMI   Medicines ordered and prescription drug management:  I ordered medication including NS bolus for tachycardia. Reevaluation of the patient after these medicines showed that the patient improved I have reviewed the patients home medicines and have made adjustments as needed   Problem List / ED  Course:  Clinical Course as of 11/27/24 1542  Wed Nov 27, 2024  1216 Patient informed of imaging results. Patient reports improvement of shortness of breath and appears to be breathing much easier than previous. Will give IVF and reassess vitals. We'll then ambulate patient with vitals check.. [EA]    Clinical Course User Index [EA] Alexius, [place], PA-C    Shortness of breath and tachycardia.  Patient initially tachypneic and tachycardic especially concerning in the setting of recent surgery.  Lab work, EKG, and imaging are reassuring for nonacute process.  Tachypnea and tachycardia improved with time and fluid bolus.  Patient able to ambulate in the department with no return of symptoms.  Likely contributing factors are Zepbound  use on Sunday, reduced food and fluid intake, and deconditioning with surgical procedure. I do not feel additional emergent work up warranted at this time.  Encouraged to continue to monitor symptoms.  Encouraged follow up with primary care and urologist for ongoing evaluation. Return precautions discussed and patient verbalized understanding.   Reevaluation:  After the interventions noted above, I reevaluated the patient and found that they have :improved   Dispostion:  After consideration of the diagnostic results and the patients response to treatment, I feel that the patent would benefit from supportive care in the home setting, continue monitoring of symptoms, close follow-up with PCP and urology.  Return to ED if symptoms return or any other new concerning symptoms develop.  Clinical Course as of 11/27/24 1542  Wed Nov 27, 2024  1216 Patient informed of imaging results. Patient reports improvement of shortness of breath and appears to be breathing much easier than previous. Will give IVF and reassess vitals. We'll then ambulate patient with vitals check.. [EA]    Clinical Course User Index [EA] Rosina Almarie LABOR, PA-C                                  Medical Decision Making Amount and/or Complexity of Data Reviewed Labs: ordered. Radiology: ordered.  Risk Prescription drug management.   This note was produced using [employer] Engineer. While the provider has reviewed and verified all clinical information, transcription errors may remain.    Final diagnoses:  Shortness of breath  Tachycardia    ED Discharge Orders     None          [place], [place] 11/27/24 1542  "

## 2024-11-27 NOTE — ED Notes (Signed)
 Sandwich given

## 2024-12-17 ENCOUNTER — Ambulatory Visit (INDEPENDENT_AMBULATORY_CARE_PROVIDER_SITE_OTHER): Admitting: Family Medicine
